# Patient Record
Sex: Female | Born: 1940 | Race: White | Hispanic: No | Marital: Married | Smoking: Former smoker
Health system: Southern US, Community
[De-identification: ages and names within clinical notes are randomized; demographics above are authoritative.]

## PROBLEM LIST (undated history)

## (undated) DIAGNOSIS — G8929 Other chronic pain: Secondary | ICD-10-CM

## (undated) DIAGNOSIS — J302 Other seasonal allergic rhinitis: Secondary | ICD-10-CM

## (undated) DIAGNOSIS — M8580 Other specified disorders of bone density and structure, unspecified site: Secondary | ICD-10-CM

## (undated) DIAGNOSIS — F419 Anxiety disorder, unspecified: Secondary | ICD-10-CM

## (undated) DIAGNOSIS — E7800 Pure hypercholesterolemia, unspecified: Secondary | ICD-10-CM

## (undated) DIAGNOSIS — Z9289 Personal history of other medical treatment: Secondary | ICD-10-CM

## (undated) DIAGNOSIS — M545 Low back pain, unspecified: Secondary | ICD-10-CM

## (undated) DIAGNOSIS — M1990 Unspecified osteoarthritis, unspecified site: Secondary | ICD-10-CM

## (undated) DIAGNOSIS — I4891 Unspecified atrial fibrillation: Secondary | ICD-10-CM

## (undated) DIAGNOSIS — R002 Palpitations: Secondary | ICD-10-CM

## (undated) DIAGNOSIS — K2970 Gastritis, unspecified, without bleeding: Secondary | ICD-10-CM

## (undated) HISTORY — DX: Unspecified atrial fibrillation: I48.91

## (undated) HISTORY — DX: Other specified disorders of bone density and structure, unspecified site: M85.80

## (undated) HISTORY — PX: COLONOSCOPY: SHX174

## (undated) HISTORY — PX: DILATION AND CURETTAGE OF UTERUS: SHX78

## (undated) HISTORY — DX: Personal history of other medical treatment: Z92.89

## (undated) HISTORY — PX: JOINT REPLACEMENT: SHX530

## (undated) HISTORY — PX: TONSILLECTOMY: SUR1361

---

## 1998-10-19 ENCOUNTER — Other Ambulatory Visit: Admission: RE | Admit: 1998-10-19 | Discharge: 1998-10-19 | Payer: Self-pay | Admitting: *Deleted

## 1999-12-04 ENCOUNTER — Encounter: Payer: Self-pay | Admitting: *Deleted

## 1999-12-04 ENCOUNTER — Encounter: Admission: RE | Admit: 1999-12-04 | Discharge: 1999-12-04 | Payer: Self-pay | Admitting: *Deleted

## 2001-04-29 ENCOUNTER — Encounter: Admission: RE | Admit: 2001-04-29 | Discharge: 2001-04-29 | Payer: Self-pay | Admitting: Family Medicine

## 2001-04-29 ENCOUNTER — Encounter: Payer: Self-pay | Admitting: Family Medicine

## 2001-06-07 ENCOUNTER — Encounter: Payer: Self-pay | Admitting: Family Medicine

## 2001-06-07 ENCOUNTER — Encounter: Admission: RE | Admit: 2001-06-07 | Discharge: 2001-06-07 | Payer: Self-pay | Admitting: Family Medicine

## 2002-05-28 ENCOUNTER — Encounter: Payer: Self-pay | Admitting: Emergency Medicine

## 2002-05-28 ENCOUNTER — Emergency Department (HOSPITAL_COMMUNITY): Admission: EM | Admit: 2002-05-28 | Discharge: 2002-05-28 | Payer: Self-pay | Admitting: Emergency Medicine

## 2002-07-25 ENCOUNTER — Encounter: Admission: RE | Admit: 2002-07-25 | Discharge: 2002-07-25 | Payer: Self-pay | Admitting: *Deleted

## 2002-07-25 ENCOUNTER — Encounter: Payer: Self-pay | Admitting: *Deleted

## 2003-08-01 ENCOUNTER — Encounter: Admission: RE | Admit: 2003-08-01 | Discharge: 2003-08-01 | Payer: Self-pay | Admitting: Pediatric Nephrology

## 2004-09-09 ENCOUNTER — Other Ambulatory Visit: Admission: RE | Admit: 2004-09-09 | Discharge: 2004-09-09 | Payer: Self-pay | Admitting: *Deleted

## 2004-10-09 ENCOUNTER — Encounter: Admission: RE | Admit: 2004-10-09 | Discharge: 2004-10-09 | Payer: Self-pay | Admitting: *Deleted

## 2005-11-24 ENCOUNTER — Encounter: Admission: RE | Admit: 2005-11-24 | Discharge: 2005-11-24 | Payer: Self-pay | Admitting: *Deleted

## 2005-12-01 ENCOUNTER — Other Ambulatory Visit: Admission: RE | Admit: 2005-12-01 | Discharge: 2005-12-01 | Payer: Self-pay | Admitting: *Deleted

## 2006-12-11 ENCOUNTER — Other Ambulatory Visit: Admission: RE | Admit: 2006-12-11 | Discharge: 2006-12-11 | Payer: Self-pay | Admitting: *Deleted

## 2006-12-14 ENCOUNTER — Encounter: Admission: RE | Admit: 2006-12-14 | Discharge: 2006-12-14 | Payer: Self-pay | Admitting: *Deleted

## 2007-12-15 ENCOUNTER — Encounter: Admission: RE | Admit: 2007-12-15 | Discharge: 2007-12-15 | Payer: Self-pay | Admitting: *Deleted

## 2008-03-20 ENCOUNTER — Other Ambulatory Visit: Admission: RE | Admit: 2008-03-20 | Discharge: 2008-03-20 | Payer: Self-pay | Admitting: Family Medicine

## 2008-08-04 ENCOUNTER — Encounter: Admission: RE | Admit: 2008-08-04 | Discharge: 2008-08-04 | Payer: Self-pay | Admitting: Chiropractic Medicine

## 2008-12-15 ENCOUNTER — Encounter: Admission: RE | Admit: 2008-12-15 | Discharge: 2008-12-15 | Payer: Self-pay | Admitting: Family Medicine

## 2009-08-04 ENCOUNTER — Encounter: Payer: Self-pay | Admitting: Sports Medicine

## 2009-10-02 ENCOUNTER — Ambulatory Visit: Payer: Self-pay | Admitting: Sports Medicine

## 2009-10-02 DIAGNOSIS — M545 Low back pain, unspecified: Secondary | ICD-10-CM | POA: Insufficient documentation

## 2009-10-02 DIAGNOSIS — M25559 Pain in unspecified hip: Secondary | ICD-10-CM | POA: Insufficient documentation

## 2009-10-02 DIAGNOSIS — M25569 Pain in unspecified knee: Secondary | ICD-10-CM | POA: Insufficient documentation

## 2009-11-14 ENCOUNTER — Ambulatory Visit: Payer: Self-pay | Admitting: Sports Medicine

## 2009-11-14 DIAGNOSIS — M1610 Unilateral primary osteoarthritis, unspecified hip: Secondary | ICD-10-CM | POA: Insufficient documentation

## 2009-11-14 DIAGNOSIS — M7750 Other enthesopathy of unspecified foot: Secondary | ICD-10-CM | POA: Insufficient documentation

## 2009-11-14 DIAGNOSIS — M169 Osteoarthritis of hip, unspecified: Secondary | ICD-10-CM | POA: Insufficient documentation

## 2009-11-14 DIAGNOSIS — M21619 Bunion of unspecified foot: Secondary | ICD-10-CM | POA: Insufficient documentation

## 2009-12-25 ENCOUNTER — Encounter: Admission: RE | Admit: 2009-12-25 | Discharge: 2009-12-25 | Payer: Self-pay | Admitting: Family Medicine

## 2010-01-23 ENCOUNTER — Encounter: Admission: RE | Admit: 2010-01-23 | Discharge: 2010-01-23 | Payer: Self-pay | Admitting: Family Medicine

## 2010-01-23 ENCOUNTER — Encounter: Payer: Self-pay | Admitting: Family Medicine

## 2010-01-23 ENCOUNTER — Ambulatory Visit: Payer: Self-pay | Admitting: Sports Medicine

## 2010-01-25 ENCOUNTER — Telehealth: Payer: Self-pay | Admitting: Family Medicine

## 2010-01-30 ENCOUNTER — Encounter: Admission: RE | Admit: 2010-01-30 | Discharge: 2010-03-26 | Payer: Self-pay | Admitting: Sports Medicine

## 2010-02-21 ENCOUNTER — Ambulatory Visit: Payer: Self-pay | Admitting: Sports Medicine

## 2010-03-26 ENCOUNTER — Encounter: Payer: Self-pay | Admitting: Sports Medicine

## 2010-08-18 ENCOUNTER — Encounter: Payer: Self-pay | Admitting: *Deleted

## 2010-08-27 NOTE — Assessment & Plan Note (Signed)
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 Pain stable but remains bothersome. No bowel/bladder incontinence. No saddle anesthesia, paresthesias, or difficulty walking. No L-Spine xrays, surgeries, or procedures.  Left hip pain improved. Pain experienced along lateral and grin aspects. Severe DJD by x-ray report from 2010.  Antero-lateral left knee pain unchanged, but remains bothersome. No prior left knee x-rays, surgeries, or procedures. No sweling, locking, popping, catching, or instability.    Allergies: No Known Drug Allergies  Past History:  Past Medical History: DJD  Past Surgical History: No prior back/hip/knee surgeries PMH-FH-SH reviewed for relevance  Physical Exam  General:  Well-developed,well-nourished,in no acute distress; alert,appropriate and cooperative throughout examination Msk:  BACK: No spinal ttp. No apparent bony deformity. FROM at the waist w/mild pain on L bend/rotation. No muscular ttp.  No apparent  spasm.  LEFT HIP: Unchanged from LOV [place] (+) log roll on left.  KNEES: FROM.  Full strength. No swelling, discoloration, or increased warmth. No ligamentous instability.  No jt line ttp. Neg McMurray's left.  No crepitus. Neurologic:  Normal symmetric DTR throughout the lower extremities. (-) SLR bilaterally. Non-antalgic gait. No foot drop.   Impression & Recommendations:  Problem # 1:  LOW BACK PAIN, CHRONIC (ICD-724.2) Assessment Unchanged Stable.  - LSPine xrays to assess for degenerative changes. - Start formal PT. - RTC in 4-6 weeks. Immediately seek MD attention for worsening symptoms, anesthesia, incontinence, or any other concerns.  Orders: Radiology other (Radiology  Other)  Problem # 2:  HIP PAIN, LEFT (ICD-719.45) Assessment: Improved Due to DJD and lumbar radiculopathy. Mangement options, along with their respective risks and benefits were discussed during this encounter. Will proceed as follows:  - Formal physical therapy. - RTC in 4-6 weeks.  Problem # 3:  KNEE PAIN, LEFT, CHRONIC (ICD-719.46) Multifactorial. Likely due to lumbar radiculopathy and left hip pain. Underlying left knee DJD also likely.  - Left knee x-rays to assess for degenerative changes. - Start formal PT. - Recumbent biking, and elliptical exercises as tolerated. - RTC in 4-6 weeks.  Orders: Radiology other (Radiology Other)  Patient Instructions: 1)  Straight Leg Side Swings - 60 times daily. 2)  Straight Leg Front Swings - 60 times daily. 3)  Straight Leg Back Swings - 60 times daily. 4)  Left Hip Rotations - 60 times daily. 5)  Return to our clinic in 4 weeks.

## 2010-08-27 NOTE — Letter (Signed)
Summary: Generic Letter  [facility]  [address]   [place], [place] [address]   Phone: [phone]  Fax: [phone]    02/21/2010  ANNJEANETTE SARWAR [address] [place], [place]  [address]  Dear Ms. Katich,   This letter verifies that I think it would be fine for you to take a class in water aerobics.          Sincerely,     Enid Baas MD

## 2010-08-27 NOTE — Miscellaneous (Signed)
Summary: [facility]  [facility]   Imported By: Marily Memos 03/26/2010 14:39:08  _____________________________________________________________________  External Attachment:    Type:   Image     Comment:   External Document

## 2010-08-27 NOTE — Progress Notes (Signed)
  Phone Note Call from Patient   Summary of Call: We discussed Shelia [name]'s Lspine/Knee x-ray findigns from 01/23/10. We will continue the current plan. Initial call taken by: Valarie Merino MD,  January 25, 2010 9:48 AM

## 2010-08-27 NOTE — Progress Notes (Signed)
  Phone Note Outgoing Call   Call placed by: Valarie Merino MD,  January 25, 2010 9:39 AM Call placed to: Patient Summary of Call: Left a voice-message, asking the patient to return my call.

## 2010-08-27 NOTE — Assessment & Plan Note (Signed)
Summary: f/u,mc   Vital Signs:  Patient profile:   70 year old female Height:      68 inches BP sitting:   114 / 72  (right arm) Cuff size:   regular  Vitals Entered By: Tessie Fass CMA (February 21, 2010 2:59 PM) CC: F/U   CC:  F/U.  History of Present Illness: Now doing exercises for leg strength series of low back exercises from PT are helping a lot PT helped her be more consistent water aerobics  2x wk gym 3x wk - upper body and 20 mins on recumbent bike  feels that she has gained a lot still w left hip pain w walking more than 5 to 10 mins  otherwise left hip not causing much pain now  Allergies: No Known Drug Allergies  Physical Exam  General:  Well-developed,well-nourished,in no acute distress; alert,appropriate and cooperative throughout examination Msk:  Left hip still shows limited ROM 15 deg IR and maybe 25 deg ER RT hip norm rotation at about 65 deg total  good hip abductionk, flexion and adduction on left now and has equalled RT sartorius norm 5/5 strength  walking gait without trendelenburg    Impression & Recommendations:  Problem # 1:  OSTEOARTHRITIS, HIP, 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) Much improved on exercise program  will still need to limit weight bearing exercise  keep up strength  reck 4 mos  Problem # 2:  LOW BACK PAIN, CHRONIC (ICD-724.2) keep up stretch and motion program  sxs much less now   see instructions  Patient Instructions: 1)  Monday and thursday 2)  do some upper body strength work 3)  Tuesday and Friday - work hip abduction, adduction and hip rotation 4)  Wed and Saturday focus on back exercise series 5)  This gives each area 2 days per week which will maintain or improve strength 6)  Then daily try to get minimum of 20 minutes but ideally 30 mins of : 7)  walking, recumbent bike, treadmill, elliptical, swimming pool or any acitivy that helps aerobically 8)  Reck in 3 to 4 mos

## 2010-08-27 NOTE — Assessment & Plan Note (Signed)
Summary: NP,HIP/LOWER BACK PAIN,MC   Vital Signs:  Patient profile:   70 year old female Height:      68 inches Weight:      139 pounds BMI:     21.21 BP sitting:   116 / 66  Vitals Entered By: Lillia Pauls CMA (October 02, 2009 10:54 AM)  CC:  left hip and low back pain.  History of Present Illness: Pt c/o left sided low back, hip, leg, and knee pain for the past 18 months.  She says she started a spinning class routine around that time and since then she has noticed a lot of hip and leg pain with exercise and if she stands or walks too much.  She has had low back pain for decades, so the back pain is not new, but the leg and hip involvement is new. She describes it as a dull ache. Certain motions make it worse, such as trying to cross the left leg over the right, when she does that she has a lot of tightness and pain in the medial hip joint.  Has tried some chiropractic treatments with a little relief but not much.    She has had hip xrays which show signioficant degenerative changes at the left hip joint and loss of joint space as well as some osteophytes.  Allergies (verified): No Known Drug Allergies  Review of Systems MS:  Complains of joint pain, loss of strength, low back pain, and stiffness. Neuro:  Denies numbness and tingling.  Physical Exam  General:  Well-developed,well-nourished,in no acute distress; alert,appropriate and cooperative throughout examination Msk:  Hip: left ROM IR: only about 15 Deg, ER: 30 Deg, Flexion: 90 Deg, Extension: 40 Deg, Abduction: 45 Deg, Adduction: 45 Deg Strength decreased IR: 4/5, ER: 4/5, Flexion: 4/5, Extension: 5/5, Abduction: 4/5, Adduction: 4/5 Pelvic alignment unremarkable to inspection and palpation. Greater trochanter without tenderness to palpation. No tenderness over piriformis and greater trochanter. No SI joint tenderness and normal minimal SI movement.  Hip R: IR 30, ER 30, flx 90, ext 30 abduction 4/5, flexion 5/5, ext  5/5  Knee: left Normal to inspection with no erythema or effusion or obvious bony abnormalities. Palpation normal with no warmth or joint line tenderness or patellar tenderness or condyle tenderness, mild patellar crepitus ROM normal in flexion and extension and lower leg rotation. Ligaments with solid consistent endpoints including ACL, PCL, LCL, MCL. Negative Mcmurray's and provocative meniscal tests. Non painful patellar compression. Patellar and quadriceps tendons unremarkable. Hamstring and quadriceps strength is normal.      Back Exam: Inspection: significant chronic changes along paraspinal musculature of lumbar spine b/l, very spastic Motion: SLR seated:       neg                   SLR lying: neg XSLR seated:     neg                   XSLR lying: neg Palpable tenderness: none Sensory change: none Reflex change: none    Neurologic:  sensation intact Psych:  Cognition and judgment appear intact. Alert and cooperative with normal attention span and concentration.    Impression & Recommendations:  Problem # 1:  HIP PAIN, LEFT (ICD-719.45) significant arthritis and loss of ROM, this has lead to significant weakness of the abductors and flexors Prescribed hip rehab exercises for the next 6 weeks can do walking or recumbent bike as tolerated for cardio, avoid spin class  continue antiinflammatories, try devil's claw  Problem # 2:  LOW BACK PAIN, CHRONIC (ICD-724.2) chronic in nature, significant sclerosis and arthritis at L4-L5 may be exacerbated by recent hip issues  Problem # 3:  KNEE PAIN, LEFT (ICD-719.46) knee is normal on exam, pain is likely secondary to hip dysfunction  Patient Instructions: 1)  Hip exercises 6-8 reps, 2 sets daily 2)  F/U 6 weeks

## 2010-08-27 NOTE — Assessment & Plan Note (Signed)
Summary: F/U,MC   Vital Signs:  Patient profile:   70 year old female BP sitting:   97 / 62  Vitals Entered By: Lillia Pauls CMA (November 14, 2009 10:48 AM)  History of Present Illness: Saw Dr Zachery Conch for chiropractic for awhile for back and got some relief after a period this did not help  50% improvment in hip and knee pain on left since starting exerc program still difficult to walk more than 10 mins as pain will go to Lt knee Has known mod severe DJD of left hip by xray has some element of DDD lumbar spine  sometimes feels some tightiness on RT hip  has done exercises for hip weakness noted last visit   Allergies: No Known Drug Allergies  Physical Exam  General:  Well-developed,well-nourished,in no acute distress; alert,appropriate and cooperative throughout examination Msk:  Bilat knee exams are not remarkable left hip flex and abduction weakness has completely resolved  biggest issue is lack of rotation of left hip only 15 eg IR and ER this is < 50% opf RT hip  feet show marked MT arch breakdown on left bunion on RT loss of long arch is mod  gait is pretty good inspite of limt w some shift to left but no trendelenburg   Impression & Recommendations:  Problem # 1:  KNEE PAIN, LEFT (ICD-719.46) this is referred from severe DJD of left hip  definitely improving  Problem # 2:  OSTEOARTHRITIS, HIP, 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

## 2010-08-27 NOTE — Letter (Signed)
Summary: [facility] PT referral form  [facility] PT referral form   Imported By: Marily Memos 01/23/2010 16:00:15  _____________________________________________________________________  External Attachment:    Type:   Image     Comment:   External Document

## 2010-09-24 ENCOUNTER — Other Ambulatory Visit: Payer: Self-pay | Admitting: Family Medicine

## 2010-09-24 ENCOUNTER — Other Ambulatory Visit (HOSPITAL_COMMUNITY)
Admission: RE | Admit: 2010-09-24 | Discharge: 2010-09-24 | Disposition: A | Discharge status: Home / Self Care | Payer: Medicare Other | Source: Ambulatory Visit | Attending: Family Medicine | Admitting: Family Medicine

## 2010-09-24 DIAGNOSIS — Z124 Encounter for screening for malignant neoplasm of cervix: Secondary | ICD-10-CM | POA: Insufficient documentation

## 2010-12-10 ENCOUNTER — Other Ambulatory Visit: Payer: Self-pay | Admitting: Family Medicine

## 2010-12-10 DIAGNOSIS — Z1231 Encounter for screening mammogram for malignant neoplasm of breast: Secondary | ICD-10-CM

## 2010-12-27 ENCOUNTER — Ambulatory Visit
Admission: RE | Admit: 2010-12-27 | Discharge: 2010-12-27 | Disposition: A | Discharge status: Home / Self Care | Payer: Medicare Other | Source: Ambulatory Visit | Attending: Family Medicine | Admitting: Family Medicine

## 2010-12-27 DIAGNOSIS — Z1231 Encounter for screening mammogram for malignant neoplasm of breast: Secondary | ICD-10-CM

## 2011-04-14 ENCOUNTER — Ambulatory Visit (INDEPENDENT_AMBULATORY_CARE_PROVIDER_SITE_OTHER): Payer: Medicare Other | Admitting: Sports Medicine

## 2011-04-14 VITALS — BP 96/64

## 2011-04-14 DIAGNOSIS — M545 Low back pain, unspecified: Secondary | ICD-10-CM

## 2011-04-14 DIAGNOSIS — M999 Biomechanical lesion, unspecified: Secondary | ICD-10-CM

## 2011-04-14 DIAGNOSIS — M1610 Unilateral primary osteoarthritis, unspecified hip: Secondary | ICD-10-CM

## 2011-04-14 DIAGNOSIS — M169 Osteoarthritis of hip, unspecified: Secondary | ICD-10-CM

## 2011-04-14 DIAGNOSIS — M25559 Pain in unspecified hip: Secondary | ICD-10-CM

## 2011-04-14 MED ORDER — TRAMADOL HCL 50 MG PO TABS: 50.0000 mg | ORAL_TABLET | Freq: Two times a day (BID) | ORAL | Status: AC | PRN

## 2011-04-14 NOTE — Assessment & Plan Note (Addendum)
Pt has evidence of osteoarthritis of the left hip on x-ray back in August of 2010. Patient actually has increased range of motion and pain has been neutral during this interval.  Patient denies much radiation of pain and no pallor bladder problems. Patient is still able to do all her activities of daily living and has a quite active lifestyle. Patient told to increase activity such as hip abductor strength and given exercises for these. Patient also will continue her yoga swimming elliptical training. Patient can followup as needed or for worsening pain. Patient also given tramadol to use at night if necessary.

## 2011-04-14 NOTE — Progress Notes (Signed)
70 yo female coming in with left hip pain.  Pt was seen for the same problem about 1 year ago.  Pt has started to do activity as usual doing yoga 3 times a week, doing physical therapy as well 2 times a week and trying water aerobics.  Pt states she is doing well but still not having the ROM she would like on the left hip.  Pt states she is also getting back pain as well since she has been compensating for her hip pain.  She has been doing some exercises for her back as well and deep tissue message but has not made much of a difference.  Pt states when she does have pain she states it is a little in the groin and no pain that passes her knee, none of her joints ever seem to be giving out on her and she states it does not stop her from activities of daily living.  Pt though would like to be hiking and walking more but does not.  Pt did state that the pain did wake her at night but started to take tylenol at night. 1000 mgm does help her sleep.  General: Well-developed,well-nourished,in no acute distress; alert,appropriate and cooperative throughout examination  Msk: Left hip shows limited ROM  20 (was 15 at last visit) deg IR and maybe 30 (was 25 deg last time) ER  RT hip norm rotation at about 65 deg total  Weak hip abduction on the left side compared to right.  sartorius norm  5/5 strength  walking gait without trendelenburg Neg SLT  OMT findings:  L2 rotated and sidebent right

## 2011-04-14 NOTE — Assessment & Plan Note (Addendum)
After verbal consent pt did have HVLA, with marked improvement.  Gave side effects to look out for and can take anti inflammatories in the acute time frame. Patient given exercises to try to decrease the muscle tightness of the hip flexors that can be contributing.

## 2011-04-14 NOTE — Assessment & Plan Note (Signed)
Hopefully this will stabilize with exercise program and moderation  Follow yearly

## 2011-04-14 NOTE — Assessment & Plan Note (Signed)
Likely secondary to compensation for her hip pain. Patient though also would probably have some osteoarthritis there as well. At this time patient did respond well to manipulation given some stretches to do as well. Patient may follow up for manipulation if it is beneficial but do think that if patient improves strength of her hip likely the back pain will improve as well.

## 2011-04-14 NOTE — Patient Instructions (Addendum)
Very nice to meet you Do the hip exercises Come back in 4 weeks for re-evaluation If manipulation works then come see me Terrilee Files at [facility] [phone]

## 2011-09-25 ENCOUNTER — Other Ambulatory Visit: Payer: Self-pay | Admitting: Family Medicine

## 2011-09-25 DIAGNOSIS — F17200 Nicotine dependence, unspecified, uncomplicated: Secondary | ICD-10-CM

## 2011-09-30 ENCOUNTER — Other Ambulatory Visit: Payer: Medicare Other

## 2011-11-24 ENCOUNTER — Other Ambulatory Visit: Payer: Self-pay | Admitting: Family Medicine

## 2011-11-24 DIAGNOSIS — Z1231 Encounter for screening mammogram for malignant neoplasm of breast: Secondary | ICD-10-CM

## 2011-12-30 ENCOUNTER — Ambulatory Visit: Payer: Medicare Other

## 2012-01-20 ENCOUNTER — Ambulatory Visit
Admission: RE | Admit: 2012-01-20 | Discharge: 2012-01-20 | Disposition: A | Discharge status: Home / Self Care | Payer: Medicare Other | Source: Ambulatory Visit | Attending: Family Medicine | Admitting: Family Medicine

## 2012-01-20 DIAGNOSIS — Z1231 Encounter for screening mammogram for malignant neoplasm of breast: Secondary | ICD-10-CM

## 2012-12-07 ENCOUNTER — Ambulatory Visit (INDEPENDENT_AMBULATORY_CARE_PROVIDER_SITE_OTHER): Payer: Medicare Other | Admitting: Family Medicine

## 2012-12-07 VITALS — BP 112/60 | HR 77 | Temp 98.3°F | Resp 16 | Ht 68.0 in | Wt 135.0 lb

## 2012-12-07 DIAGNOSIS — J029 Acute pharyngitis, unspecified: Secondary | ICD-10-CM

## 2012-12-07 NOTE — Progress Notes (Addendum)
[facility] [address], [place] [address] [phone]  Date:  12/07/2012   Name:  Shelia [name]   DOB:  19-Apr-1941   MRN:  [id number]  PCP:  Astrid Divine, MD    Chief Complaint: Cough, Sore Throat and Fever   History of Present Illness:  Shelia [name] is a 72 y.o. very pleasant female patient who presents with the following:  She has noted a sore throat and positive strep exposure- her granddaughter, daughter, and 2 other family members have had strep recently.  She noted a slight scratchy throat, but this morning it was worse.  Otherwise she feels ok- does not have any malaise, aches or fever.  She had noted a slight cough/ tickle in her chest lately but is not that concerned about this.  Feels this is likely related to seasonal allergies      Patient Active Problem List   Diagnosis Date Noted  . Nonallopathic lesion of lumbar region 04/14/2011  . OSTEOARTHRITIS, HIP, LEFT 11/14/2009  . METATARSALGIA 11/14/2009  . BUNION, RIGHT FOOT 11/14/2009  . HIP PAIN, LEFT 10/02/2009  . Pain in joint, lower leg 10/02/2009  . LOW BACK PAIN, CHRONIC 10/02/2009    History reviewed. No pertinent past medical history.  History reviewed. No pertinent past surgical history.  History  Substance Use Topics  . Smoking status: Former Games developer  . Smokeless tobacco: Not on file  . Alcohol Use: No    Family History  Problem Relation Age of Onset  . Hypertension Mother   . Cancer Father   . Diabetes Sister     No Known Allergies  Medication list has been reviewed and updated.  Current Outpatient Prescriptions on File Prior to Visit  Medication Sig Dispense Refill  . fish oil-omega-3 fatty acids 1000 MG capsule Take 2 g by mouth daily.        Marland Kitchen loratadine (CLARITIN) 10 MG tablet Take 10 mg by mouth daily.        Marland Kitchen acetaminophen (TYLENOL) 325 MG tablet Take 650 mg by mouth every 6 (six) hours as needed.         No current  facility-administered medications on file prior to visit.    Review of Systems:  As per HPI- otherwise negative.   Physical Examination: Filed Vitals:   12/07/12 0836  BP: 112/60  Pulse: 77  Temp: 99.3 F (37.4 C)  Resp: 16   Filed Vitals:   12/07/12 0836  Height: 5\' 8"  (1.727 m)  Weight: 135 lb (61.236 kg)   Body mass index is 20.53 kg/(m^2). Ideal Body Weight: Weight in (lb) to have BMI = 25: 164.1  GEN: WDWN, NAD, Non-toxic, A & O x 3, looks well   HEENT: Atraumatic, Normocephalic. Neck supple. No masses, No LAD.  Bilateral TM wnl, oropharynx normal.  PEERL,EOMI.   Ears and Nose: No external deformity. CV: RRR, No M/G/R. No JVD. No thrill. No extra heart sounds. PULM: CTA B, no wheezes, crackles, rhonchi. No retractions. No resp. distress. No accessory muscle use. ABD: S, NT, ND EXTR: No c/c/e NEURO Normal gait.  PSYCH: Normally interactive. Conversant. Not depressed or anxious appearing.  Calm demeanor.    Results for orders placed in visit on 12/07/12  POCT RAPID STREP A (OFFICE)      Result Value Range   Rapid Strep A Screen Negative  Negative    Assessment and Plan: Acute pharyngitis - Plan: POCT rapid strep A, Culture, Group A  Strep  Await throat culture. Doubt strep based on exam and negative rapid strep, so she is comfortable deferring treatment until throat culture comes back.    Signed Abbe Amsterdam, MD  Called to let her know her throat culture is negative.  LMOM- call if any concerns

## 2012-12-07 NOTE — Patient Instructions (Addendum)
You most likely have a viral sore throat or allergies.  I will be in touch with the result of your throat culture.  If you are getting worse or have any other symptoms in the meantime please let me know.

## 2012-12-09 LAB — CULTURE, GROUP A STREP: Organism ID, Bacteria: NORMAL

## 2012-12-14 ENCOUNTER — Other Ambulatory Visit: Payer: Self-pay

## 2012-12-14 DIAGNOSIS — Z1231 Encounter for screening mammogram for malignant neoplasm of breast: Secondary | ICD-10-CM

## 2013-02-02 ENCOUNTER — Ambulatory Visit (INDEPENDENT_AMBULATORY_CARE_PROVIDER_SITE_OTHER): Payer: Medicare Other | Admitting: Sports Medicine

## 2013-02-02 ENCOUNTER — Encounter: Payer: Self-pay | Admitting: Sports Medicine

## 2013-02-02 VITALS — BP 117/76 | HR 73 | Ht 68.0 in | Wt 135.0 lb

## 2013-02-02 DIAGNOSIS — M1610 Unilateral primary osteoarthritis, unspecified hip: Secondary | ICD-10-CM

## 2013-02-02 DIAGNOSIS — R269 Unspecified abnormalities of gait and mobility: Secondary | ICD-10-CM

## 2013-02-02 DIAGNOSIS — M2170 Unequal limb length (acquired), unspecified site: Secondary | ICD-10-CM

## 2013-02-02 DIAGNOSIS — M169 Osteoarthritis of hip, unspecified: Secondary | ICD-10-CM

## 2013-02-02 NOTE — Assessment & Plan Note (Signed)
She has been in rigid orthotics for a long time  No correction for Leg length  Added to see if she can walk with less pain  Gait is improved with the lift - felt taper  Note she may need a more cushioned orthotic is pain persists

## 2013-02-02 NOTE — Assessment & Plan Note (Signed)
She is doing well with HEP and with Curcumin preparation  Keep up plan  Getting some RT SIJ pain Will add to HEP to open hip with rotation and correct LLI  Reck 3 mos

## 2013-02-02 NOTE — Patient Instructions (Addendum)
Try orthotic correction for the next month- if this helps we could make you a pair of cushioned custom orthotics  Ok to continue taking phenocane  Do repeats 5 reps of balance exercises- hold for 5-10 seconds each  Diver - eyes open  Standing hip rotation - eyes open  Standing 1 leg balance- with eyes closed ( stand near something you can use for balance)  Please follow up in 1 month  Thank you for seeing [place] today!

## 2013-02-02 NOTE — Assessment & Plan Note (Signed)
Lift added to RT rigid orthotic that she has used

## 2013-02-02 NOTE — Progress Notes (Signed)
  Subjective:    Patient ID: Shelia [name], female    DOB: [birth year], 72 y.o.   MRN: [id number]  HPI  Pt presents to clinic for f/u of Lt hip and lower back pain. She has known osteoarthritis of Lt hip, and is managing it with exercise and phenocane (curcumin and DLPA) that she purchased at [employer], she feels this is very helpful. Has problems standing for long periods.  Doing 50 min water aerobic routine x 4-5 days per week.  Walks on the weekends, and when she can during the week. Gets left knee pain and rt SI joint pain at times. Pain level usually 3-4 on pain scale of 10 when she takes phenocane, but get can get up to 7-8.  Has custom orthotics that are rigid.    Review of Systems     Objective:   Physical Exam  Hip exam 10 deg IR on lt 20 deg ER on lt 30 deg IR on rt 30 deg ER on rt  Lying position- left leg is longer than rt, does not correct with sitting 25 deg ER on lt 15 deg IR on rt 35 deg ER on rt 30 deg IR on rt 140 deg flexion on rt 130 deg flexion on lt   Leg lengths 96 cm on lt 95 cm on rt  Mild lumbar scoliosis Lt hip and shoulder higher with standing Rt SI joint tight  Walking gait Mild trendelenburg to left, rt hip is low         Assessment & Plan:

## 2013-02-08 ENCOUNTER — Ambulatory Visit
Admission: RE | Admit: 2013-02-08 | Discharge: 2013-02-08 | Disposition: A | Discharge status: Home / Self Care | Payer: Medicare Other | Source: Ambulatory Visit

## 2013-02-08 DIAGNOSIS — Z1231 Encounter for screening mammogram for malignant neoplasm of breast: Secondary | ICD-10-CM

## 2013-03-08 ENCOUNTER — Ambulatory Visit (INDEPENDENT_AMBULATORY_CARE_PROVIDER_SITE_OTHER): Payer: Medicare Other | Admitting: Sports Medicine

## 2013-03-08 VITALS — BP 90/60 | Ht 68.0 in | Wt 135.0 lb

## 2013-03-08 DIAGNOSIS — M545 Low back pain, unspecified: Secondary | ICD-10-CM

## 2013-03-08 DIAGNOSIS — M169 Osteoarthritis of hip, unspecified: Secondary | ICD-10-CM

## 2013-03-08 DIAGNOSIS — R269 Unspecified abnormalities of gait and mobility: Secondary | ICD-10-CM

## 2013-03-08 DIAGNOSIS — M1610 Unilateral primary osteoarthritis, unspecified hip: Secondary | ICD-10-CM

## 2013-03-08 DIAGNOSIS — M2170 Unequal limb length (acquired), unspecified site: Secondary | ICD-10-CM

## 2013-03-08 NOTE — Assessment & Plan Note (Addendum)
Pt switched from rigid orthotic today to Hapad with shorter, flat heel wedge to see if this improves her comfort and Sx.  If no improvement, may need custom orthotics at next visit.

## 2013-03-08 NOTE — Assessment & Plan Note (Signed)
Trendelenburg is less when we have the leg length correction in place

## 2013-03-08 NOTE — Assessment & Plan Note (Signed)
Pt continues to have L hip pain with about 40 degrees total of rotation.  Most likely secondary to compensation for her LLI.  Continue with her aleve and Celebrex along with her Curcumin preparation as tolerated.  Added neutral heel wedge to both R shoe/sandal for improvement. Will see back in 2-3 months.

## 2013-03-08 NOTE — Progress Notes (Signed)
TAWAN CORKERN is a 72 y.o. female who presents today for f/u of her L hip pain and LBP.  She was last seen about one month ago for this, and has continued with her HEP and curcumin (curcumin) preparation which have helped her.  Her pain, especially in her lower back, and her left hip are worse with standing for prolonged periods of time but do improve somewhat with walking or water aerobics.  She has not gotten much relief from the HEP, and states this hasn't really alleviated some of her pain.  Denies any numbness, weakness, or burning pain into her lower legs.   We placed a leg length correction of the right but it was somewhat uncomfortable   ROS: Per HPI.  All other systems reviewed and are negative.   Physical Exam Filed Vitals:   03/08/13 1156  BP: 90/60    Physical Examination: MSK:  Hip exam  L IR 10-15 degrees, ER 20-25 degrees  R IR 30-35 degrees, L ER 30-35 degrees  130-140 deg flexion on rt  120-130 deg flexion on lt  Leg lengths  96 cm on lt  95 cm on rt  R innominate bone anterior rotated compared to L   Back: Mild lumbar scoliosis but no TTP at the SI joint.   Ankle: Neurovascular intact   L hip X-ray - Significant joint narrowing of the left hip with small osteophyte at the inferior acetabulum

## 2013-03-08 NOTE — Assessment & Plan Note (Signed)
Likely secondary to compensation for her hip pain. Patient though also would probably have some osteoarthritis there as well. Pt instructed and went over strengthening exercises and stretches for her lower back.  Detailed exercises in pt instructions

## 2013-03-08 NOTE — Patient Instructions (Signed)
Shelia [name], it was nice seeing you today.  Please complete the following exercises to help with both your hip and your back.  For your hamstring, please continue performing the diver as well as the internal/external rotation of the hips.  For your back please perform the following.  Please sit in a chair and lock your arms around your knees, stretching the muscles in your lower back.  As well, then try grabbing the outside of each foot to stretch the outside of the back muscles.    We will see you back in one to two months.  Thank you.

## 2013-03-10 ENCOUNTER — Ambulatory Visit: Payer: Medicare Other | Admitting: Sports Medicine

## 2013-05-03 ENCOUNTER — Ambulatory Visit: Payer: Medicare Other | Admitting: Sports Medicine

## 2013-05-03 ENCOUNTER — Ambulatory Visit (INDEPENDENT_AMBULATORY_CARE_PROVIDER_SITE_OTHER): Payer: Medicare Other | Admitting: Sports Medicine

## 2013-05-03 VITALS — BP 95/61 | Ht 67.5 in | Wt 135.0 lb

## 2013-05-03 DIAGNOSIS — M2170 Unequal limb length (acquired), unspecified site: Secondary | ICD-10-CM

## 2013-05-03 DIAGNOSIS — M1610 Unilateral primary osteoarthritis, unspecified hip: Secondary | ICD-10-CM

## 2013-05-03 DIAGNOSIS — M169 Osteoarthritis of hip, unspecified: Secondary | ICD-10-CM

## 2013-05-03 NOTE — Progress Notes (Signed)
  Subjective:    Patient ID: Shelia [name], female    DOB: 05/27/1941, 72 y.o.   MRN: [id number]  HPI  Pt presents to clinic for f/u of L hip pain and low back pain which is improving. Using Phenacaine - curcumin preparation has been very helpful. She is wearing a heel lift in rt shoe to correct leg length difference.  Continues with HEP, and working on balance exercises.   She is on an active HEP and does water aerobics Walks QOD  Hip pain is stable and not as severe as 1 yr ago   Review of Systems     Objective:   Physical Exam  NAD  1 leg balance with eyes closed- not stable right or left  Hips Rt - 60 deg total  Lt - 25 deg ER and 15 deg IR  Hip flexion good bilat Good hip abduction strength  Walking gait is improved with less trendelenburg  SI joint motion good Straight leg raise negative bilat       Assessment & Plan:

## 2013-05-03 NOTE — Patient Instructions (Addendum)
Continue using leg length correction in all shoes  Continue working on strengthening exercises  Ok to go walking 4-5 days per week   Stationary biking is good to help strengthen hip  Use light ankle weight for hip range of motion exercises  Avoid too much hip flexion or lifting too much weight with hip joint  Please follow up in 3 months  Thank you for seeing [place] today!

## 2013-05-03 NOTE — Assessment & Plan Note (Signed)
Left hip ROM has improved from about 30 deg total to 45 deg with HEP and use of lift  Keep up HEP  Keep using lift  Keep up herbal supplement  Reck 3 mos

## 2013-05-03 NOTE — Assessment & Plan Note (Signed)
Cont to use heel lift on all shoes on RT  Additional ones given  Sports insoles for walking as she has large bunion on RT

## 2014-01-03 ENCOUNTER — Other Ambulatory Visit: Payer: Self-pay

## 2014-01-03 DIAGNOSIS — Z1231 Encounter for screening mammogram for malignant neoplasm of breast: Secondary | ICD-10-CM

## 2014-02-10 ENCOUNTER — Ambulatory Visit
Admission: RE | Admit: 2014-02-10 | Discharge: 2014-02-10 | Disposition: A | Discharge status: Home / Self Care | Payer: Medicare Other | Source: Ambulatory Visit

## 2014-02-10 DIAGNOSIS — Z1231 Encounter for screening mammogram for malignant neoplasm of breast: Secondary | ICD-10-CM

## 2014-10-11 ENCOUNTER — Other Ambulatory Visit: Payer: Self-pay | Admitting: Orthopaedic Surgery

## 2014-10-11 DIAGNOSIS — M545 Low back pain: Secondary | ICD-10-CM

## 2014-10-30 ENCOUNTER — Other Ambulatory Visit: Payer: Self-pay

## 2014-11-08 ENCOUNTER — Ambulatory Visit
Admission: RE | Admit: 2014-11-08 | Discharge: 2014-11-08 | Disposition: A | Discharge status: Home / Self Care | Payer: Medicare Other | Source: Ambulatory Visit | Attending: Orthopaedic Surgery | Admitting: Orthopaedic Surgery

## 2014-11-08 DIAGNOSIS — M545 Low back pain: Secondary | ICD-10-CM

## 2015-08-24 DIAGNOSIS — M25552 Pain in left hip: Secondary | ICD-10-CM | POA: Diagnosis not present

## 2015-08-24 DIAGNOSIS — M25562 Pain in left knee: Secondary | ICD-10-CM | POA: Diagnosis not present

## 2015-08-29 ENCOUNTER — Ambulatory Visit (INDEPENDENT_AMBULATORY_CARE_PROVIDER_SITE_OTHER): Payer: PPO | Admitting: Podiatry

## 2015-08-29 ENCOUNTER — Encounter: Payer: Self-pay | Admitting: Podiatry

## 2015-08-29 VITALS — BP 123/80 | HR 48 | Resp 12

## 2015-08-29 DIAGNOSIS — Q828 Other specified congenital malformations of skin: Secondary | ICD-10-CM

## 2015-08-29 DIAGNOSIS — M2011 Hallux valgus (acquired), right foot: Secondary | ICD-10-CM | POA: Diagnosis not present

## 2015-08-29 NOTE — Progress Notes (Signed)
   Subjective:    Patient ID: Shelia [name], female    DOB: [birth year], 75 y.o.   MRN: [id number]  HPI this patient presents to my office with chief complaint of painful calluses on the balls of both feet. She states that her feet are painful walking and wearing her shoes. She states that this been going on for years and has been treated by multiple doctors with multiple sets of orthotics.  She has been treated with modifications on the orthoses including right heel lift.  She says she is going on vacation and desires an evaluation and treatment of her feet.  The patient presents here today for b/l feet pain. Review of Systems  All other systems reviewed and are negative.      Objective:   Physical Exam GENERAL APPEARANCE: Alert, conversant. Appropriately groomed. No acute distress.  VASCULAR: Pedal pulses palpable at  [facility] and PT bilateral.  Capillary refill time is immediate to all digits,  Normal temperature gradient.  Digital hair growth is present bilateral  NEUROLOGIC: sensation is normal to 5.07 monofilament at 5/5 sites bilateral.  Light touch is intact bilateral, Muscle strength normal.  MUSCULOSKELETAL: acceptable muscle strength, tone and stability bilateral.  Intrinsic muscluature intact bilateral.  Rectus appearance of foot and digits noted bilateral. Contracted fifth toe left foot.  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  DERMATOLOGIC: skin color, texture, and turgor are within normal limits.  No preulcerative lesions or ulcers  are seen, no interdigital maceration noted.  No open lesions present.  Digital nails are asymptomatic. No drainage noted. Porokeratosis noted sub 2,3 right foot.          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  Discussed her pathology with patient.  If she desires further with her deformity she will need an appointment at [facility].   Gardiner Barefoot DPM

## 2015-10-19 ENCOUNTER — Other Ambulatory Visit: Payer: Self-pay

## 2015-10-19 DIAGNOSIS — M859 Disorder of bone density and structure, unspecified: Secondary | ICD-10-CM | POA: Diagnosis not present

## 2015-10-19 DIAGNOSIS — F39 Unspecified mood [affective] disorder: Secondary | ICD-10-CM | POA: Diagnosis not present

## 2015-10-19 DIAGNOSIS — N760 Acute vaginitis: Secondary | ICD-10-CM | POA: Diagnosis not present

## 2015-10-19 DIAGNOSIS — Z0000 Encounter for general adult medical examination without abnormal findings: Secondary | ICD-10-CM | POA: Diagnosis not present

## 2015-10-19 DIAGNOSIS — Z01419 Encounter for gynecological examination (general) (routine) without abnormal findings: Secondary | ICD-10-CM | POA: Diagnosis not present

## 2015-10-19 DIAGNOSIS — Z1231 Encounter for screening mammogram for malignant neoplasm of breast: Secondary | ICD-10-CM

## 2015-10-19 DIAGNOSIS — J309 Allergic rhinitis, unspecified: Secondary | ICD-10-CM | POA: Diagnosis not present

## 2015-10-19 DIAGNOSIS — M1990 Unspecified osteoarthritis, unspecified site: Secondary | ICD-10-CM | POA: Diagnosis not present

## 2015-10-19 DIAGNOSIS — E7800 Pure hypercholesterolemia, unspecified: Secondary | ICD-10-CM | POA: Diagnosis not present

## 2015-10-19 DIAGNOSIS — Z131 Encounter for screening for diabetes mellitus: Secondary | ICD-10-CM | POA: Diagnosis not present

## 2015-10-19 DIAGNOSIS — L719 Rosacea, unspecified: Secondary | ICD-10-CM | POA: Diagnosis not present

## 2015-10-22 DIAGNOSIS — Z1211 Encounter for screening for malignant neoplasm of colon: Secondary | ICD-10-CM | POA: Diagnosis not present

## 2015-11-02 ENCOUNTER — Ambulatory Visit
Admission: RE | Admit: 2015-11-02 | Discharge: 2015-11-02 | Disposition: A | Discharge status: Home / Self Care | Payer: PPO | Source: Ambulatory Visit

## 2015-11-02 DIAGNOSIS — Z1231 Encounter for screening mammogram for malignant neoplasm of breast: Secondary | ICD-10-CM | POA: Diagnosis not present

## 2015-11-22 DIAGNOSIS — I491 Atrial premature depolarization: Secondary | ICD-10-CM | POA: Diagnosis not present

## 2015-11-22 DIAGNOSIS — R002 Palpitations: Secondary | ICD-10-CM | POA: Diagnosis not present

## 2015-12-05 ENCOUNTER — Ambulatory Visit (INDEPENDENT_AMBULATORY_CARE_PROVIDER_SITE_OTHER): Payer: PPO | Admitting: Podiatry

## 2015-12-05 ENCOUNTER — Encounter: Payer: Self-pay | Admitting: Podiatry

## 2015-12-05 DIAGNOSIS — M2011 Hallux valgus (acquired), right foot: Secondary | ICD-10-CM

## 2015-12-05 DIAGNOSIS — Q828 Other specified congenital malformations of skin: Secondary | ICD-10-CM | POA: Diagnosis not present

## 2015-12-05 NOTE — Progress Notes (Signed)
Subjective:     Patient ID: Shelia [name], female   DOB: Apr 28, 1941, 75 y.o.   MRN: [id number]  HPI this patient presents to my office with chief complaint of a painful callus in the middle of her right foot. She states this callus is painful walking and wearing her shoes. She is scheduled for a vacation which requires significant amount of walking. She presents the office today for an evaluation and treatment of this condition   Review of Systems     Objective:   Physical Exam GENERAL APPEARANCE: Alert, conversant. Appropriately groomed. No acute distress.  VASCULAR: Pedal pulses are  palpable at  [facility] and PT bilateral.  Capillary refill time is immediate to all digits,  Normal temperature gradient.  Digital hair growth is present bilateral  NEUROLOGIC: sensation is normal to 5.07 monofilament at 5/5 sites bilateral.  Light touch is intact bilateral, Muscle strength normal.  MUSCULOSKELETAL: acceptable muscle strength, tone and stability bilateral.  Intrinsic muscluature intact bilateral.  Contracted fifth toe left foot.  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  Hammer toes 2,3 right foot.   DERMATOLOGIC: skin color, texture, and turgor are within normal limits.  No preulcerative lesions or ulcers  are seen, no interdigital maceration noted.  No open lesions present.  Digital nails are asymptomatic. No drainage noted. Porokeratosis right forefoot.      Assessment:     Porokeratosis right foot     Plan:     Debridement of skin lesion.  RTC prn.   Gardiner Barefoot DPM

## 2015-12-25 DIAGNOSIS — H2513 Age-related nuclear cataract, bilateral: Secondary | ICD-10-CM | POA: Diagnosis not present

## 2015-12-25 DIAGNOSIS — H43811 Vitreous degeneration, right eye: Secondary | ICD-10-CM | POA: Diagnosis not present

## 2015-12-25 DIAGNOSIS — H16223 Keratoconjunctivitis sicca, not specified as Sjogren's, bilateral: Secondary | ICD-10-CM | POA: Diagnosis not present

## 2016-03-19 DIAGNOSIS — H16223 Keratoconjunctivitis sicca, not specified as Sjogren's, bilateral: Secondary | ICD-10-CM | POA: Diagnosis not present

## 2016-03-19 DIAGNOSIS — H04123 Dry eye syndrome of bilateral lacrimal glands: Secondary | ICD-10-CM | POA: Diagnosis not present

## 2016-05-28 DIAGNOSIS — M25552 Pain in left hip: Secondary | ICD-10-CM | POA: Diagnosis not present

## 2016-05-28 DIAGNOSIS — S0093XA Contusion of unspecified part of head, initial encounter: Secondary | ICD-10-CM | POA: Diagnosis not present

## 2016-05-28 DIAGNOSIS — N898 Other specified noninflammatory disorders of vagina: Secondary | ICD-10-CM | POA: Diagnosis not present

## 2016-05-28 DIAGNOSIS — M25562 Pain in left knee: Secondary | ICD-10-CM | POA: Diagnosis not present

## 2016-06-04 DIAGNOSIS — M25552 Pain in left hip: Secondary | ICD-10-CM | POA: Diagnosis not present

## 2016-06-04 DIAGNOSIS — M25562 Pain in left knee: Secondary | ICD-10-CM | POA: Diagnosis not present

## 2016-06-06 DIAGNOSIS — N760 Acute vaginitis: Secondary | ICD-10-CM | POA: Diagnosis not present

## 2016-06-06 DIAGNOSIS — Z124 Encounter for screening for malignant neoplasm of cervix: Secondary | ICD-10-CM | POA: Diagnosis not present

## 2016-06-10 DIAGNOSIS — M25552 Pain in left hip: Secondary | ICD-10-CM | POA: Diagnosis not present

## 2016-06-10 DIAGNOSIS — M25562 Pain in left knee: Secondary | ICD-10-CM | POA: Diagnosis not present

## 2016-06-16 ENCOUNTER — Telehealth: Payer: Self-pay | Admitting: Family Medicine

## 2016-06-16 DIAGNOSIS — S39012A Strain of muscle, fascia and tendon of lower back, initial encounter: Secondary | ICD-10-CM | POA: Diagnosis not present

## 2016-06-16 NOTE — Telephone Encounter (Signed)
How about 1030 on wed.

## 2016-06-16 NOTE — Telephone Encounter (Signed)
Pt called in and wanted to know if she could be worked in today for a shot in her hip.  She said that she is still in pain

## 2016-06-16 NOTE — Telephone Encounter (Signed)
Spoke to pt, scheduled her for 1030am on 11.22.17.

## 2016-06-17 NOTE — Progress Notes (Signed)
[name] [address], [place] [address] Phone: [phone] Subjective:    I'm seeing this patient by the request  of:  Osborne Casco, MD   CC: Hip pain  [id number]  Shelia [name] is a 75 y.o. female coming in with complaint of hip pain Patient has a past medical history significant for left hip pain. Patient was found to have a leg length discrepancy previously and as long as patient wore her heel lift she seems to be doing relatively well for quite some time.  Patient states that she is having worsening pain of the right hip. Patient states that this started to be aggravated after she took a fall. Started having pain on the right side. Mild radiation to the right buttocks. Worse with activity as well as with sitting. Some mild radiation to the thigh. No numbness. No weakness. Patient states though that it is starting to affect daily activities such as walking long distances. Patient states that it is difficult to find a comfortable position at night and is waking up earlier secondary to the pain.   past imaging includes lumbar x-rays back in 2011. 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  Patient's left hip x-rays in 2010 showed the patient had severe arthritis at that time.  No past medical history on file. No past surgical history on file. Social History   Social History  . Marital status: Married    Spouse name: N/A  . Number of children: N/A  . Years of education: N/A   Social History Main Topics  . Smoking status: Former Research scientist (life sciences)  . Smokeless tobacco: None  . Alcohol use No  . Drug use: No  . Sexual activity: Not Asked   Other Topics Concern  . None   Social History Narrative  . None   No Known Allergies Family History  Problem Relation Age of Onset  . Hypertension Mother   . Cancer Father   . Diabetes Sister     Past medical history,  social, surgical and family history all reviewed in electronic medical record.  No pertanent information unless stated regarding to the chief complaint.   Review of Systems:Review of systems updated and as accurate as of 06/18/16  No headache, visual changes, nausea, vomiting, diarrhea, constipation, dizziness, abdominal pain, skin rash, fevers, chills, night sweats, weight loss, swollen lymph nodes, body aches, joint swelling, muscle aches, chest pain, shortness of breath, mood changes.   Objective  Blood pressure 112/80, pulse 66, height 5' 10.5" (1.791 m), weight 137 lb (62.1 kg), SpO2 98 %. Systems examined below as of 06/18/16   General: No apparent distress alert and oriented x3 mood and affect normal, dressed appropriately.  HEENT: Pupils equal, extraocular movements intact  Respiratory: Patient's speak in full sentences and does not appear short of breath  Cardiovascular: No lower extremity edema, non tender, no erythema  Skin: Warm dry intact with no signs of infection or rash on extremities or on axial skeleton.  Abdomen: Soft nontender  Neuro: Cranial nerves II through XII are intact, neurovascularly intact in all extremities with 2+ DTRs and 2+ pulses.  Lymph: No lymphadenopathy of posterior or anterior cervical chain or axillae bilaterally.  Gait antalgic gait MSK:  Non tender with full range of motion and good stability and symmetric strength and tone of shoulders, elbows, wrist, , knee and ankles bilaterally. Arthritic changes noted  Patient's right hip has  full range of motion and full strength. Patient has a mild positive straight leg test the right compared to the contralateral side. Worsening pain with Corky Sox on the right side as well. Patient's left hip has minimal internal range of motion. Doesn't pain with internal range of motion of the left hip.   Impression and Recommendations:     This case required medical decision making of moderate complexity.      Note:  This dictation was prepared with Dragon dictation along with smaller phrase technology. Any transcriptional errors that result from this process are unintentional.

## 2016-06-18 ENCOUNTER — Ambulatory Visit (INDEPENDENT_AMBULATORY_CARE_PROVIDER_SITE_OTHER)
Admission: RE | Admit: 2016-06-18 | Discharge: 2016-06-18 | Disposition: A | Discharge status: Home / Self Care | Payer: PPO | Source: Ambulatory Visit | Attending: Family Medicine | Admitting: Family Medicine

## 2016-06-18 ENCOUNTER — Encounter: Payer: Self-pay | Admitting: Family Medicine

## 2016-06-18 ENCOUNTER — Ambulatory Visit (INDEPENDENT_AMBULATORY_CARE_PROVIDER_SITE_OTHER): Payer: PPO | Admitting: Family Medicine

## 2016-06-18 VITALS — BP 112/80 | HR 66 | Ht 70.5 in | Wt 137.0 lb

## 2016-06-18 DIAGNOSIS — M545 Low back pain: Secondary | ICD-10-CM | POA: Diagnosis not present

## 2016-06-18 DIAGNOSIS — M5416 Radiculopathy, lumbar region: Secondary | ICD-10-CM | POA: Insufficient documentation

## 2016-06-18 MED ORDER — GABAPENTIN 100 MG PO CAPS: 200.0000 mg | ORAL_CAPSULE | Freq: Every day | ORAL | 3 refills | Status: DC

## 2016-06-18 MED ORDER — PREDNISONE 50 MG PO TABS
50.0000 mg | ORAL_TABLET | Freq: Every day | ORAL | 0 refills | Status: DC
Start: 2016-06-18 — End: 2016-07-29

## 2016-06-18 NOTE — Assessment & Plan Note (Signed)
Patient is having more of a lumbar radiculopathy. Patient was started on prednisone gabapentin. I believe the patient's right hip is fine. X-rays of lumbar spine ordered today. We discussed icing regimen, patient will come back again in 1 week.  Improving we'll get her to start formal physical therapy again.

## 2016-06-18 NOTE — Patient Instructions (Signed)
Good to see you.  Ice 20 minutes 2 times daily. Usually after activity and before bed. Xray downstairs Prednisone daily for 5 days Gabapentin 200mg  at night Vitamin D 2000 IU daily  If any weakness or numbness occurs seek medical attention immediately.  See me again in 1 week (OK to double book) No exercises til Monday.

## 2016-06-24 NOTE — Progress Notes (Signed)
[name] [address] [place], [place] [address] Phone: [phone] Subjective:    I'm seeing this patient by the request  of:  Osborne Casco, MD   CC: Hip pain f/u  [id number]  Shelia [name] is a 75 y.o. female coming in with complaint of hip pain Patient was having more of a right-sided hip pain. Seems to be more of a lumbar radiculopathy. Started on prednisone as well as gabapentin. Discussed icing regimen. Had tramadol for breakthrough pain. Patient states The prednisone made very minimal improvement. Continues to have pain. States that the first movement in the morning is severe. Still has a radiation going down the posterior lateral aspect of the leg. Denies any weakness but states that she is having some bladder incontinence.   X-rays were ordered, reviewed and interpreted by me. X-rays taken 06/18/2016 showed moderate to severe degenerative disc disease from L4-S1.  Marland Kitchen  Patient's left hip x-rays in 2010 showed the patient had severe arthritis at that time. Still has not had any surgical intervention.  No past medical history on file. No past surgical history on file. Social History   Social History  . Marital status: Married    Spouse name: N/A  . Number of children: N/A  . Years of education: N/A   Social History Main Topics  . Smoking status: Former Research scientist (life sciences)  . Smokeless tobacco: None  . Alcohol use No  . Drug use: No  . Sexual activity: Not Asked   Other Topics Concern  . None   Social History Narrative  . None   No Known Allergies Family History  Problem Relation Age of Onset  . Hypertension Mother   . Cancer Father   . Diabetes Sister     Past medical history, social, surgical and family history all reviewed in electronic medical record.  No pertanent information unless stated regarding to the chief complaint.   Review of Systems:Review of systems updated and as accurate as of 06/25/16  No headache,  visual changes, nausea, vomiting, diarrhea, constipation, dizziness, abdominal pain, skin rash, fevers, chills, night sweats, weight loss, swollen lymph nodes, body aches, joint swelling, muscle aches, chest pain, shortness of breath, mood changes.   Objective  Blood pressure 112/74, pulse (!) 59, height 5\' 8"  (1.727 m), SpO2 97 %. Systems examined below as of 06/25/16   Systems examined below as of 06/25/16 General: NAD A&O x3 mood, affect normal  HEENT: Pupils equal, extraocular movements intact no nystagmus Respiratory: not short of breath at rest or with speaking Cardiovascular: No lower extremity edema, non tender Skin: Warm dry intact with no signs of infection or rash on extremities or on axial skeleton. Abdomen: Soft nontender, no masses Neuro: Cranial nerves  intact, neurovascularly intact Lymph: No lymphadenopathy appreciated today  Gait antalgic gait worsening MSK:  Non tender with full range of motion and good stability and symmetric strength and tone of shoulders, elbows, wrist, , knee and ankles bilaterally. Arthritic changes noted  Patient's right hip has full range of motion and full strength. Patient has a positive straight leg test the right compared to the contralateral side. Worsening pain with Corky Sox on the right side as well. Patient's left hip has minimal internal range of motion. Patient seendistal reflexes are 1+ on the right with the Achilles compared to the contralateral side.  patient strength of the lower extremity is 4 out of 5 but seems to be symmetric. Neurovascularly intact otherwise. Impression and Recommendations:  This case required medical decision making of moderate complexity.      Note: This dictation was prepared with Dragon dictation along with smaller phrase technology. Any transcriptional errors that result from this process are unintentional.

## 2016-06-25 ENCOUNTER — Encounter: Payer: Self-pay | Admitting: Family Medicine

## 2016-06-25 ENCOUNTER — Ambulatory Visit (INDEPENDENT_AMBULATORY_CARE_PROVIDER_SITE_OTHER): Payer: PPO | Admitting: Family Medicine

## 2016-06-25 VITALS — BP 112/74 | HR 59 | Ht 68.0 in

## 2016-06-25 DIAGNOSIS — M5416 Radiculopathy, lumbar region: Secondary | ICD-10-CM | POA: Diagnosis not present

## 2016-06-25 DIAGNOSIS — R32 Unspecified urinary incontinence: Secondary | ICD-10-CM

## 2016-06-25 NOTE — Assessment & Plan Note (Signed)
Worsening symptoms.  Patient did not respond well to the prednisone, patient is having urinary incontinence. This is concerning. We discussed with patient about the potential of going to the emergency room which patient declined. Patient knows that if any worsening continence occur she needs to seek medical attention immediately. Patient still did not want to go today and was being somewhat noncompliant. We discussed with patient about the gabapentin. Patient will decrease the dose just in case this is causing some of the urinary incontinence. I do feel that advance imaging is warranted with patient having increasing pain and failing conservative therapy as well as patient having radicular symptoms as well as urinary incontinence. Patient will have this MRI done and depending on findings we will discuss different treatment options thereafter.

## 2016-06-25 NOTE — Patient Instructions (Signed)
Good to see you  We will need MRI of your back  If the bladder get worse PLEASE GO TO THE EMERGENCY ROOM! Continue the tramadol with the tylenol  Decrease the gabapentin to see if it helps the bladder to one pill at night  Heat 20 minutes then ice 20 minutes and off for 2 hours.  Repeat as much as you want I would move forward with discussing the hip replacement.  Also I will be in contact with you about the MRI and may order the injection if I see an area where it will help.  Happy holidays!

## 2016-06-29 ENCOUNTER — Ambulatory Visit
Admission: RE | Admit: 2016-06-29 | Discharge: 2016-06-29 | Disposition: A | Discharge status: Home / Self Care | Payer: PPO | Source: Ambulatory Visit | Attending: Family Medicine | Admitting: Family Medicine

## 2016-06-29 DIAGNOSIS — M5416 Radiculopathy, lumbar region: Secondary | ICD-10-CM

## 2016-06-29 DIAGNOSIS — M48061 Spinal stenosis, lumbar region without neurogenic claudication: Secondary | ICD-10-CM | POA: Diagnosis not present

## 2016-06-30 ENCOUNTER — Encounter: Payer: Self-pay | Admitting: Family Medicine

## 2016-06-30 DIAGNOSIS — M5416 Radiculopathy, lumbar region: Secondary | ICD-10-CM

## 2016-07-01 MED ORDER — TRAMADOL HCL 50 MG PO TABS: 50.0000 mg | ORAL_TABLET | Freq: Four times a day (QID) | ORAL | 0 refills | Status: DC | PRN

## 2016-07-01 NOTE — Telephone Encounter (Signed)
We will order epidural at L4/5 please.  Refilled tramadol please fax when you can.  Thank you.

## 2016-07-02 ENCOUNTER — Encounter: Payer: Self-pay | Admitting: Family Medicine

## 2016-07-10 DIAGNOSIS — N950 Postmenopausal bleeding: Secondary | ICD-10-CM | POA: Diagnosis not present

## 2016-07-10 DIAGNOSIS — N760 Acute vaginitis: Secondary | ICD-10-CM | POA: Diagnosis not present

## 2016-07-11 ENCOUNTER — Ambulatory Visit
Admission: RE | Admit: 2016-07-11 | Discharge: 2016-07-11 | Disposition: A | Discharge status: Home / Self Care | Payer: PPO | Source: Ambulatory Visit | Attending: Family Medicine | Admitting: Family Medicine

## 2016-07-11 DIAGNOSIS — M545 Low back pain: Secondary | ICD-10-CM | POA: Diagnosis not present

## 2016-07-11 DIAGNOSIS — M5416 Radiculopathy, lumbar region: Secondary | ICD-10-CM

## 2016-07-11 MED ORDER — METHYLPREDNISOLONE ACETATE 40 MG/ML INJ SUSP (RADIOLOG
120.0000 mg | Freq: Once | INTRAMUSCULAR | Status: AC
  Administered 2016-07-11: 120 mg via EPIDURAL

## 2016-07-11 MED ORDER — IOPAMIDOL (ISOVUE-M 200) INJECTION 41%
1.0000 mL | Freq: Once | INTRAMUSCULAR | Status: AC
  Administered 2016-07-11: 1 mL via EPIDURAL

## 2016-07-11 NOTE — Discharge Instructions (Signed)

## 2016-07-15 ENCOUNTER — Encounter: Payer: Self-pay | Admitting: Family Medicine

## 2016-07-28 DIAGNOSIS — Z9289 Personal history of other medical treatment: Secondary | ICD-10-CM

## 2016-07-28 HISTORY — DX: Personal history of other medical treatment: Z92.89

## 2016-07-28 NOTE — Progress Notes (Signed)
[name] [address] [place], [place] [address] Phone: [phone] Subjective:    I'm seeing this patient by the request  of:  Osborne Casco, MD   CC: Hip pain f/u  [id number]  Shelia [name] is a 76 y.o. female coming in with complaint of hip pain Patient was having more of a right-sided hip pain. Seems to be more of a lumbar radiculopathy. Patient continued to have worsening symptoms and did have an MRI of the back. MRI of the lumbar spine showed the patient did have degenerative disc disease at L3-S1. Patient did have foraminal narrowing at L5-S1 on the right for right L5 nerve root impingement. This MRI was independently visualized by me. Patient then was given a right L4-L5 epidural injection. Patient's had this done 2 weeks ago. Patient states Doing much better. Feels like she is 75% better. Not having as much of the radicular symptoms. Still some mild weakness she states. Patient feels that this is likely secondary to the arthritis of the left hip. Patient is going to have that replaced in the near future.   Patient's left hip x-rays in 2010 showed the patient had severe arthritis at that time. Still has not had any surgical intervention.  No past medical history on file. No past surgical history on file. Social History   Social History  . Marital status: Married    Spouse name: N/A  . Number of children: N/A  . Years of education: N/A   Social History Main Topics  . Smoking status: Former Research scientist (life sciences)  . Smokeless tobacco: None  . Alcohol use No  . Drug use: No  . Sexual activity: Not Asked   Other Topics Concern  . None   Social History Narrative  . None   No Known Allergies Family History  Problem Relation Age of Onset  . Hypertension Mother   . Cancer Father   . Diabetes Sister     Past medical history, social, surgical and family history all reviewed in electronic medical record.  No pertanent information  unless stated regarding to the chief complaint.   Review of Systems: No headache, visual changes, nausea, vomiting, diarrhea, constipation, dizziness, abdominal pain, skin rash, fevers, chills, night sweats, weight loss, swollen lymph nodes, body aches, joint swelling, muscle aches, chest pain, shortness of breath, mood changes.    Objective  Blood pressure 100/70, pulse 80, height 5' 10.5" (1.791 m), weight 129 lb (58.5 kg), SpO2 97 %.   Systems examined below as of 07/29/16 General: NAD A&O x3 mood, affect normal  HEENT: Pupils equal, extraocular movements intact no nystagmus Respiratory: not short of breath at rest or with speaking Cardiovascular: No lower extremity edema, non tender Skin: Warm dry intact with no signs of infection or rash on extremities or on axial skeleton. Abdomen: Soft nontender, no masses Neuro: Cranial nerves  intact, neurovascularly intact in all extremities with 2+ DTRs and 2+ pulses. Lymph: No lymphadenopathy appreciated today  Gait still minorly antalgic MSK:  Non tender with full range of motion and good stability and symmetric strength and tone of shoulders, elbows, wrist, , knee and ankles bilaterally. Arthritic changes noted  Patient's right hip has full range of motion and full strength. Patient has a negative straight leg test today. Still has significant decreased range of motion of the left hip with significant pain with internal range of motion. Neurovascular intact distally with full strength of seems to be symmetric. Impression and Recommendations:  This case required medical decision making of moderate complexity.      Note: This dictation was prepared with Dragon dictation along with smaller phrase technology. Any transcriptional errors that result from this process are unintentional.

## 2016-07-29 ENCOUNTER — Encounter: Payer: Self-pay | Admitting: Family Medicine

## 2016-07-29 ENCOUNTER — Ambulatory Visit (INDEPENDENT_AMBULATORY_CARE_PROVIDER_SITE_OTHER): Payer: PPO | Admitting: Family Medicine

## 2016-07-29 VITALS — BP 100/70 | HR 80 | Ht 70.5 in | Wt 129.0 lb

## 2016-07-29 DIAGNOSIS — M5416 Radiculopathy, lumbar region: Secondary | ICD-10-CM | POA: Diagnosis not present

## 2016-07-29 NOTE — Patient Instructions (Addendum)
Good to see you  Happy New Year! Ice is your friend.  We will get you in with physical therapy to start with some strengthening.  [facility] will help a lot Send me a message when you know date of surgery and I will make sure you have a good team  See me again when you need me or if worsening symptoms we will get another injection.

## 2016-07-29 NOTE — Assessment & Plan Note (Signed)
Improvement at this time. Doing well overall. We'll start patient in formal physical therapy. Encourage her to continue with the gabapentin. Patient will continue to be active. I do feel that if patient gets left hip replaced she will have significant improvement in quality of life. We can repeat epidurals if needed. Otherwise follow-up as needed.

## 2016-07-29 NOTE — Assessment & Plan Note (Signed)
Seem specialist in the near future.

## 2016-08-01 DIAGNOSIS — M549 Dorsalgia, unspecified: Secondary | ICD-10-CM | POA: Diagnosis not present

## 2016-08-01 DIAGNOSIS — K2970 Gastritis, unspecified, without bleeding: Secondary | ICD-10-CM | POA: Diagnosis not present

## 2016-08-12 DIAGNOSIS — M25552 Pain in left hip: Secondary | ICD-10-CM | POA: Diagnosis not present

## 2016-08-13 ENCOUNTER — Ambulatory Visit: Payer: PPO | Admitting: Physical Therapy

## 2016-08-20 ENCOUNTER — Ambulatory Visit: Payer: PPO | Attending: Family Medicine | Admitting: Physical Therapy

## 2016-08-20 DIAGNOSIS — M6281 Muscle weakness (generalized): Secondary | ICD-10-CM | POA: Insufficient documentation

## 2016-08-20 DIAGNOSIS — M5441 Lumbago with sciatica, right side: Secondary | ICD-10-CM | POA: Diagnosis not present

## 2016-08-20 DIAGNOSIS — G8929 Other chronic pain: Secondary | ICD-10-CM | POA: Diagnosis not present

## 2016-08-20 DIAGNOSIS — M62830 Muscle spasm of back: Secondary | ICD-10-CM | POA: Insufficient documentation

## 2016-08-21 ENCOUNTER — Encounter: Payer: Self-pay | Admitting: Physical Therapy

## 2016-08-21 NOTE — Therapy (Signed)
[place], [place], [address] Phone: [phone]   Fax:  [phone]  Physical Therapy Evaluation  Patient Details  Name: Shelia [name] MRN: [id number] Date of Birth: 26-Jul-1941 Referring Provider: Dr Zenovia Jarred   Encounter Date: 08/20/2016      PT End of Session - 08/20/16 0810    Visit Number 1   Number of Visits 16   Date for PT Re-Evaluation 10/16/16   Authorization Type healthstream medicare    PT Start Time 0800   PT Stop Time 0843   PT Time Calculation (min) 43 min   Activity Tolerance Patient tolerated treatment well   Behavior During Therapy [facility] for tasks assessed/performed      No past medical history on file.  No past surgical history on file.  There were no vitals filed for this visit.       Subjective Assessment - 08/20/16 0813    Subjective Patient has a history of lower back pain and left hip pain. She will have the left hip replaced in the near future. She does YOGA. Some of her YOGA poses cause her pain. She would like an exercise program that she can fdo at home that will help her but not inflame her back.    Limitations House hold activities   How long can you walk comfortably? walking about 2 miles which seemed to cause    Diagnostic tests MRI: Pinched disc at L5    Patient Stated Goals To get back to normal activity    Currently in Pain? Yes   Pain Score 7    Pain Location Back   Pain Orientation Right   Pain Onset More than a month ago   Pain Frequency Intermittent   Aggravating Factors  standing/ walkin g   Multiple Pain Sites No            [facility] PT Assessment - 08/21/16 0001      Assessment   Medical Diagnosis Low back pain    Referring Provider Dr Rosamaria Lints Simth    Onset Date/Surgical Date --  long term care    Hand Dominance Right   Next MD Visit None schedued   Prior Therapy None      Precautions   Precautions None     Balance Screen   Has the patient fallen in the past 6 months No   How many times? 1  1 fall recently    Has the patient had a decrease in activity level because of a fear of falling?  No   Is the patient reluctant to leave their home because of a fear of falling?  No     Home Social worker Private residence   Living Arrangements Spouse/significant other   Additional Comments Steps to her basement which casue her some left hip pain      Prior Function   Level of Independence Independent   Vocation Retired   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 Has gotten orthotics but they were not comfortable.    Focus on Therapeutic Outcomes (FOTO)  Not given by front  Sensation   Additional Comments has had radicular pain in the past but is not having any at this time     Coordination   Gross Motor Movements are Fluid and Coordinated No   Fine Motor Movements are Fluid and Coordinated No     ROM / Strength   AROM / PROM / Strength AROM;PROM;Strength     AROM   AROM Assessment Site Lumbar   Lumbar Flexion Normal    Lumbar Extension pain with end range extension    Lumbar - Right Side Bend WNL    Lumbar - Left Side Bend WNL    Lumbar - Right Rotation WNL    Lumbar - Left Rotation WNL      PROM   Overall PROM Comments Passive left hip flexion IR and ER limited compared to right      Strength   Strength Assessment Site Hip;Knee   Right/Left Hip Right;Left   Right Hip Flexion 4/5   Right Hip Extension 3+/5   Right Hip ABduction 4+/5   Right Hip ADduction 5/5   Left Hip Flexion 4-/5   Left Hip Extension 3/5   Left Hip ABduction 3+/5    Left Hip ADduction 5/5     Palpation   Palpation comment spasming flet in the lateral right upper glut area      Special Tests    Special Tests Leg LengthTest   Leg length test  Apparent     Apparent   Comments Leg appears to be shorter on the left     Ambulation/Gait   Gait Comments lateral movement with gait.                    [place] Adult PT Treatment/Exercise - 08/21/16 0001      Lumbar Exercises: Stretches   Single Knee to Chest Stretch Limitations 2x20sec hold    Piriformis Stretch Limitations 2x20sec holfd      Lumbar Exercises: Supine   AB Set Limitations posterior pelvic tilit/ pre vridge 2x10    Clam Limitations 2x10 red    Heel Slides Limitations 2x10 education on progressing difficult                PT Education - 08/21/16 1039    Education provided Yes   Education Details HEP, symptom management    Person(s) Educated Patient;Parent(s)   Methods Demonstration;Explanation;Tactile cues;Verbal cues   Comprehension Verbalized understanding;Verbal cues required;Returned demonstration          PT Short Term Goals - 08/21/16 1051      PT SHORT TERM GOAL #1   Title Patient will report < 2/10 pain in the lumbar spine    Time 4   Period Weeks   Status New     PT SHORT TERM GOAL #2   Title Patient will be independent with initial HEP for core stability    Time 4   Period Weeks   Status New     PT SHORT TERM GOAL #3   Title Patient will demsotrate 5/5 right LE and 4+/5 L LE strength gross    Time 4   Period Weeks   Status New           PT Long Term Goals - 08/21/16 1053      PT LONG TERM GOAL #1   Title Patient will demonstrate proper technique with home program for back and hip strengthening    Time 8   Period Weeks   Status New  PT LONG TERM GOAL #2   Title Patient will ambulate 2 miles without increased pain in order to perfrom IADL's and walk for exercises    Time 8   Period Weeks   Status New     PT LONG TERM  GOAL #3   Title Patient will stand for 1 hour without increased pain in order to perfrom IADL's    Time 8   Period Weeks   Status New               Plan - 08/21/16 1042    Clinical Impression Statement Patient is a 76 year old female with lower back pain. She has pain in her left hip. She will be having a left hip replacement soon. She was seen for therapy but feels high level exercises caused her an increase in pain. She is afraid to do activity at this time. She would like a plan that guides her in what to do to sttrengthening her core. She presents with tightness in bilateral hips. She has spemaing into her right lateral glut. She had radiuclar pain but it is better after recieving a epidural. She would benefit from skilled therapy to improve her core strength and for an HEP. She was seen for a low complexity HEP.    Rehab Potential Good   PT Frequency 2x / week   PT Duration 8 weeks   PT Treatment/Interventions ADLs/Self Care Home Management;Cryotherapy;Electrical Stimulation;Iontophoresis 4mg /ml Dexamethasone;Moist Heat;Ultrasound;Gait training;Therapeutic activities;Therapeutic exercise;Cognitive remediation;Patient/family education;Manual techniques;Dry needling;Splinting;Taping;Passive range of motion;Traction   PT Next Visit Plan progress light strengthening exercises. Assess tolerance to new exercises    PT Home Exercise Plan heel slide with TA, pre brideg pelvic tilt, Hip abdcution with band; Hip ER stretch, single knee to chest stretch, piriformis stretch.    Consulted and Agree with Plan of Care Patient      Patient will benefit from skilled therapeutic intervention in order to improve the following deficits and impairments:  Abnormal gait, Decreased mobility, Increased muscle spasms, Decreased strength, Difficulty walking, Increased fascial restricitons, Decreased knowledge of use of DME, Decreased activity tolerance, Decreased range of motion, Impaired flexibility  Visit  Diagnosis: Chronic right-sided low back pain with right-sided sciatica - Plan: PT plan of care cert/re-cert  Muscle weakness (generalized) - Plan: PT plan of care cert/re-cert  Muscle spasm of back - Plan: PT plan of care cert/re-cert     Problem List Patient Active Problem List   Diagnosis Date Noted  . Urinary incontinence 06/25/2016  . Lumbar radiculopathy 06/18/2016  . 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 Abnormality of gait 02/02/2013  . Nonallopathic lesion of lumbar region 04/14/2011  . OSTEOARTHRITIS, HIP, LEFT 11/14/2009  . METATARSALGIA 11/14/2009  . BUNION, RIGHT FOOT 11/14/2009  . HIP PAIN, LEFT 10/02/2009  . Pain in joint, lower leg 10/02/2009  . LOW BACK PAIN, CHRONIC 10/02/2009    Carney Living  PT DPT  08/21/2016, 1:17 PM  [facility] [address] [place], [place], [address] Phone: [phone]   Fax:  [phone]  Name: Shelia [name] MRN: [id number] Date of Birth: [birth year]

## 2016-08-27 ENCOUNTER — Ambulatory Visit: Payer: PPO | Admitting: Physical Therapy

## 2016-08-27 DIAGNOSIS — M6281 Muscle weakness (generalized): Secondary | ICD-10-CM

## 2016-08-27 DIAGNOSIS — G8929 Other chronic pain: Secondary | ICD-10-CM

## 2016-08-27 DIAGNOSIS — M62830 Muscle spasm of back: Secondary | ICD-10-CM

## 2016-08-27 DIAGNOSIS — M5441 Lumbago with sciatica, right side: Secondary | ICD-10-CM | POA: Diagnosis not present

## 2016-08-27 NOTE — Therapy (Signed)
[place], [place], [address] Phone: [phone]   Fax:  [phone]  Physical Therapy Treatment  Patient Details  Name: Shelia [name] MRN: [id number] Date of Birth: 1940-08-01 Referring Provider: Dr Zenovia Jarred   Encounter Date: 08/27/2016      PT End of Session - 08/27/16 1502    Visit Number 2   Number of Visits 16   Date for PT Re-Evaluation 10/16/16   Authorization Type [employer] medicare    PT Start Time 1432   PT Stop Time 1527   PT Time Calculation (min) 55 min   Activity Tolerance Patient tolerated treatment well   Behavior During Therapy [facility] for tasks assessed/performed      No past medical history on file.  No past surgical history on file.  There were no vitals filed for this visit.      Subjective Assessment - 08/27/16 1438    Subjective She has just some achy pain, has been driving around.  PT regimen was too strenuous last Nov. 2017.  Been doing her exercises.    Currently in Pain? Yes   Pain Score 3    Pain Location Back   Pain Orientation Right;Left;Lower   Pain Descriptors / Indicators Aching   Pain Type Chronic pain   Pain Onset More than a month ago   Pain Frequency Intermittent   Aggravating Factors  standing, walking, bending    Pain Relieving Factors stretching   Effect of Pain on Daily Activities has been dealing with this a long time, wants to get ready for hip replacement in March ?   Multiple Pain Sites No                         [facility] Adult PT Treatment/Exercise - 08/27/16 1507      Self-Care   Self-Care Posture;Other Self-Care Comments   Posture neutral spine    Other Self-Care Comments  Pilates      Lumbar Exercises: Stretches   Single Knee to Chest Stretch 3 reps;20 seconds   Double Knee to Chest Stretch 1 rep;30 seconds   Pelvic Tilt 10 seconds   Piriformis Stretch 2 reps;30 seconds   Piriformis Stretch Limitations 2 x 30  sec knee over knee and rotation      Lumbar Exercises: Supine   Ab Set 10 reps   AB Set Limitations PPT x 10    Clam 20 reps   Heel Slides 10 reps   Bridge 10 reps   Bridge Limitations bridge and clam x 10      Manual Therapy   Soft tissue mobilization lumbar parapsinals and Rt. gluteals , mod pressure in prone to reduce muscle tension                 PT Education - 08/27/16 1531    Education provided Yes   Education Details Pilates, HEP modification and neutral spine, core control    Person(s) Educated Patient   Methods Explanation   Comprehension Verbalized understanding;Returned demonstration          PT Short Term Goals - 08/27/16 1536      PT SHORT TERM GOAL #1   Title Patient will report < 2/10 pain in the lumbar spine    Status On-going     PT SHORT TERM GOAL #2   Title Patient will be independent with initial HEP for core stability    Status On-going  PT SHORT TERM GOAL #3   Title Patient will demonstrate 5/5 right LE and 4+/5 L LE strength gross    Status On-going           PT Long Term Goals - 08/27/16 1536      PT LONG TERM GOAL #1   Title Patient will demonstrate proper technique with home program for back and hip strengthening    Status On-going     PT LONG TERM GOAL #2   Title Patient will ambulate 2 miles without increased pain in order to perfrom IADL's and walk for exercises    Status On-going     PT LONG TERM GOAL #3   Title Patient will stand for 1 hour without increased pain in order to perform IADL's    Status On-going               Plan - 08/27/16 1533    Clinical Impression Statement Patient able to do HEP wiht min correction, did not fully understand activation of core muscles.  She was a bit afraid of flaring up her Rt. side which was so painful in Nov.    PT Next Visit Plan Try Supine Pilates Reformer, repeat manual if needed   PT Home Exercise Plan heel slide with TA, pre brideg pelvic tilt, Hip abdcution  with band; Hip ER stretch, single knee to chest stretch, piriformis stretch.    Consulted and Agree with Plan of Care Patient      Patient will benefit from skilled therapeutic intervention in order to improve the following deficits and impairments:  Abnormal gait, Decreased mobility, Increased muscle spasms, Decreased strength, Difficulty walking, Increased fascial restricitons, Decreased knowledge of use of DME, Decreased activity tolerance, Decreased range of motion, Impaired flexibility  Visit Diagnosis: Chronic right-sided low back pain with right-sided sciatica  Muscle spasm of back  Muscle weakness (generalized)     Problem List Patient Active Problem List   Diagnosis Date Noted  . Urinary incontinence 06/25/2016  . Lumbar radiculopathy 06/18/2016  . 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 Abnormality of gait 02/02/2013  . Nonallopathic lesion of lumbar region 04/14/2011  . OSTEOARTHRITIS, HIP, LEFT 11/14/2009  . METATARSALGIA 11/14/2009  . BUNION, RIGHT FOOT 11/14/2009  . HIP PAIN, LEFT 10/02/2009  . Pain in joint, lower leg 10/02/2009  . LOW BACK PAIN, CHRONIC 10/02/2009    Tarnesha Ulloa 08/27/2016, 3:39 PM  [facility] [address] [place], [place], [address] Phone: [phone]   Fax:  [phone]  Name: CHERYLN PUTNAM MRN: [id number] Date of Birth: 1941-01-14  [name], PT 08/27/16 3:40 PM Phone: [phone] Fax: [phone]

## 2016-08-28 ENCOUNTER — Other Ambulatory Visit: Payer: Self-pay | Admitting: Orthopedic Surgery

## 2016-09-03 ENCOUNTER — Ambulatory Visit: Payer: PPO | Admitting: Physical Therapy

## 2016-09-03 DIAGNOSIS — K2970 Gastritis, unspecified, without bleeding: Secondary | ICD-10-CM | POA: Diagnosis not present

## 2016-09-05 ENCOUNTER — Ambulatory Visit: Payer: PPO | Attending: Family Medicine | Admitting: Physical Therapy

## 2016-09-05 DIAGNOSIS — M5441 Lumbago with sciatica, right side: Secondary | ICD-10-CM | POA: Diagnosis not present

## 2016-09-05 DIAGNOSIS — M6281 Muscle weakness (generalized): Secondary | ICD-10-CM | POA: Insufficient documentation

## 2016-09-05 DIAGNOSIS — G8929 Other chronic pain: Secondary | ICD-10-CM

## 2016-09-05 DIAGNOSIS — M62830 Muscle spasm of back: Secondary | ICD-10-CM | POA: Diagnosis not present

## 2016-09-05 NOTE — Therapy (Signed)
[place], [place], [address] Phone: [phone]   Fax:  [phone]  Physical Therapy Treatment  Patient Details  Name: Shelia [name] MRN: [id number] Date of Birth: [birth year] Referring Provider: Dr Zenovia Jarred   Encounter Date: 09/05/2016      PT End of Session - 09/05/16 1044    Visit Number 3   Number of Visits 16   Date for PT Re-Evaluation 10/16/16   Authorization Type [employer] medicare    PT Start Time 1026   PT Stop Time 1105   PT Time Calculation (min) 39 min   Activity Tolerance Patient tolerated treatment well   Behavior During Therapy [facility] for tasks assessed/performed      No past medical history on file.  No past surgical history on file.  There were no vitals filed for this visit.      Subjective Assessment - 09/05/16 1027    Subjective Pt arr 10 min late.  Not taking Tylenol.  Yesterday i did something as I was putting my socks on and I did something to my back. I am concerned about my balance    Currently in Pain? Yes   Pain Score 4    Pain Location Back       Pilates Reformer used for LE/core strength, postural strength, lumbopelvic disassociation and core control.  Exercises included:  Footwork 2 Red 1 blue parallel and turn out heels and forefoot, challenging for Lt LE (hip limited in ER and has mod knee pain) tendency for lumbar flexion   Bridging 2 red  1 blue articulating bridge x 10  Supine Arm work 1 [employer] and T  Feet in Straps 1 [employer],  Increased time spent on ensuring neutral spine          PT Short Term Goals - 09/05/16 1047      PT SHORT TERM GOAL #1   Title Patient will report < 2/10 pain in the lumbar spine    Status On-going     PT SHORT TERM GOAL #2   Title Patient will be independent with initial HEP for core stability    Status Achieved     PT SHORT TERM GOAL #3   Title Patient will demonstrate 5/5 right LE and 4+/5 L LE  strength gross    Status On-going           PT Long Term Goals - 09/05/16 1048      PT LONG TERM GOAL #1   Title Patient will demonstrate proper technique with home program for back and hip strengthening    Status On-going     PT LONG TERM GOAL #2   Title Patient will ambulate 2 miles without increased pain in order to perfrom IADL's and walk for exercises    Status On-going     PT LONG TERM GOAL #3   Title Patient will stand for 1 hour without increased pain in order to perform IADL's    Status On-going               Plan - 09/05/16 1029    Clinical Impression Statement Pt with limitations in L hip ROM.  She had some degree of difficulty with basic PIlates Reformer exercises. No increased pain post session.    PT Next Visit Plan Try Supine Pilates Reformer again, Berg   PT Home Exercise Plan heel slide with TA, pre brideg pelvic tilt, Hip abdcution with band; Hip ER stretch,  single knee to chest stretch, piriformis stretch.    Consulted and Agree with Plan of Care Patient      Patient will benefit from skilled therapeutic intervention in order to improve the following deficits and impairments:  Abnormal gait, Decreased mobility, Increased muscle spasms, Decreased strength, Difficulty walking, Increased fascial restricitons, Decreased knowledge of use of DME, Decreased activity tolerance, Decreased range of motion, Impaired flexibility  Visit Diagnosis: Chronic right-sided low back pain with right-sided sciatica  Muscle spasm of back  Muscle weakness (generalized)     Problem List Patient Active Problem List   Diagnosis Date Noted  . Urinary incontinence 06/25/2016  . Lumbar radiculopathy 06/18/2016  . 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 Abnormality of gait 02/02/2013  . Nonallopathic lesion of lumbar region 04/14/2011  . OSTEOARTHRITIS, HIP, LEFT 11/14/2009  . METATARSALGIA 11/14/2009  . BUNION, RIGHT FOOT 11/14/2009  . HIP PAIN, LEFT 10/02/2009  . Pain  in joint, lower leg 10/02/2009  . LOW BACK PAIN, CHRONIC 10/02/2009    Ranier Coach 09/05/2016, 12:12 PM  [facility] [address] [place], [place], [address] Phone: [phone]   Fax:  [phone]  Name: Shelia [name] MRN: [id number] Date of Birth: 10-Jul-1941  [name], PT 09/05/16 12:12 PM Phone: [phone] Fax: [phone]

## 2016-09-08 DIAGNOSIS — M85852 Other specified disorders of bone density and structure, left thigh: Secondary | ICD-10-CM | POA: Diagnosis not present

## 2016-09-10 ENCOUNTER — Ambulatory Visit: Payer: PPO | Admitting: Physical Therapy

## 2016-09-10 DIAGNOSIS — M62830 Muscle spasm of back: Secondary | ICD-10-CM

## 2016-09-10 DIAGNOSIS — G8929 Other chronic pain: Secondary | ICD-10-CM

## 2016-09-10 DIAGNOSIS — M5441 Lumbago with sciatica, right side: Secondary | ICD-10-CM | POA: Diagnosis not present

## 2016-09-10 DIAGNOSIS — M6281 Muscle weakness (generalized): Secondary | ICD-10-CM

## 2016-09-10 NOTE — Therapy (Signed)
[place], [place], [address] Phone: [phone]   Fax:  [phone]  Physical Therapy Treatment  Patient Details  Name: Shelia [name] MRN: [id number] Date of Birth: 1940/09/03 Referring Provider: Dr Zenovia Jarred   Encounter Date: 09/10/2016      PT End of Session - 09/10/16 1015    Visit Number 4   Number of Visits 16   Date for PT Re-Evaluation 10/16/16   Authorization Type [employer] medicare    PT Start Time 1015   PT Stop Time 1100   PT Time Calculation (min) 45 min   Activity Tolerance Patient tolerated treatment well   Behavior During Therapy [facility] for tasks assessed/performed      No past medical history on file.  No past surgical history on file.  There were no vitals filed for this visit.      Subjective Assessment - 09/10/16 1240    Subjective Doing OK today, no increased pain after last session. I feel like I lean to the Lt, which is the presurgical one.             [facility] PT Assessment - 09/10/16 1020      Standardized Balance Assessment   Standardized Balance Assessment Berg Balance Test     Berg Balance Test   Sit to Stand Able to stand without using hands and stabilize independently   Standing Unsupported Able to stand safely 2 minutes   Sitting with Back Unsupported but Feet Supported on Floor or Stool Able to sit safely and securely 2 minutes   Stand to Sit Sits safely with minimal use of hands   Transfers Able to transfer safely, minor use of hands   Standing Unsupported with Eyes Closed Able to stand 10 seconds safely   Standing Ubsupported with Feet Together Able to place feet together independently and stand 1 minute safely   From Standing, Reach Forward with Outstretched Arm Can reach confidently >25 cm (10")   From Standing Position, Pick up Object from Floor Able to pick up shoe safely and easily   From Standing Position, Turn to Look Behind Over each  Shoulder Looks behind from both sides and weight shifts well   Turn 360 Degrees Able to turn 360 degrees safely in 4 seconds or less   Standing Unsupported, Alternately Place Feet on Step/Stool Able to stand independently and safely and complete 8 steps in 20 seconds   Standing Unsupported, One Foot in Front Able to place foot tandem independently and hold 30 seconds   Standing on One Leg Able to lift leg independently and hold > 10 seconds   Total Score 56     Dynamic Gait Index   Change in Gait Speed Normal   Gait with Horizontal Head Turns Normal   Gait with Vertical Head Turns Normal   Gait and Pivot Turn Normal   Step Over Obstacle Normal   Step Around Obstacles Normal   Steps Normal   DGI comment: 27                     [facility] Adult PT Treatment/Exercise - 09/10/16 1044      Lumbar Exercises: Machines for Strengthening   Other Lumbar Machine Exercise Pilates Reformer see note      Moist Heat Therapy   Number Minutes Moist Heat 10 Minutes   Moist Heat Location Lumbar Spine      Pilates Reformer used for LE/core strength, postural strength, lumbopelvic  disassociation and core control.  Exercises included:  Footwork single leg in supine 2 Red, also done in sidelying 1 red 1 blue for lateral hip strength and core control.   Bridging 3 springs x 10  Supine Arm work 1 Red Arm arcs Mod cues to fully extend elbows and engage scapula, tends to lose control of lower body, use momentum.               PT Education - 09/10/16 1244    Education provided Yes   Education Details Balance assessment, result and causes of feeling imbalanced.    Person(s) Educated Patient   Methods Explanation   Comprehension Verbalized understanding          PT Short Term Goals - 09/05/16 1047      PT SHORT TERM GOAL #1   Title Patient will report < 2/10 pain in the lumbar spine    Status On-going     PT SHORT TERM GOAL #2   Title Patient will be independent with initial  HEP for core stability    Status Achieved     PT SHORT TERM GOAL #3   Title Patient will demonstrate 5/5 right LE and 4+/5 L LE strength gross    Status On-going           PT Long Term Goals - 09/05/16 1048      PT LONG TERM GOAL #1   Title Patient will demonstrate proper technique with home program for back and hip strengthening    Status On-going     PT LONG TERM GOAL #2   Title Patient will ambulate 2 miles without increased pain in order to perfrom IADL's and walk for exercises    Status On-going     PT LONG TERM GOAL #3   Title Patient will stand for 1 hour without increased pain in order to perform IADL's    Status On-going               Plan - 09/10/16 1016    Clinical Impression Statement Berg and DGI score completely normal.  No need for follow up but likely due to pain and decreased function of L hip.  Surgery 09/29/16.    PT Next Visit Plan progress core and hip with Pilates   PT Home Exercise Plan heel slide with TA, pre brideg pelvic tilt, Hip abdcution with band; Hip ER stretch, single knee to chest stretch, piriformis stretch.    Consulted and Agree with Plan of Care Patient      Patient will benefit from skilled therapeutic intervention in order to improve the following deficits and impairments:  Abnormal gait, Decreased mobility, Increased muscle spasms, Decreased strength, Difficulty walking, Increased fascial restricitons, Decreased knowledge of use of DME, Decreased activity tolerance, Decreased range of motion, Impaired flexibility  Visit Diagnosis: Chronic right-sided low back pain with right-sided sciatica  Muscle spasm of back  Muscle weakness (generalized)     Problem List Patient Active Problem List   Diagnosis Date Noted  . Urinary incontinence 06/25/2016  . Lumbar radiculopathy 06/18/2016  . 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 Abnormality of gait 02/02/2013  . Nonallopathic lesion of lumbar region 04/14/2011  . OSTEOARTHRITIS,  HIP, LEFT 11/14/2009  . METATARSALGIA 11/14/2009  . BUNION, RIGHT FOOT 11/14/2009  . HIP PAIN, LEFT 10/02/2009  . Pain in joint, lower leg 10/02/2009  . LOW BACK PAIN, CHRONIC 10/02/2009    Shelia [name] 09/10/2016, 12:47 PM  [place] [facility]  [place], [place], [address] Phone: [phone]   Fax:  [phone]  Name: Shelia [name] MRN: [id number] Date of Birth: 02-18-1941  [name], PT 09/10/16 12:51 PM Phone: [phone] Fax: [phone]

## 2016-09-11 ENCOUNTER — Other Ambulatory Visit: Payer: Self-pay | Admitting: Gastroenterology

## 2016-09-11 DIAGNOSIS — R1013 Epigastric pain: Secondary | ICD-10-CM | POA: Diagnosis not present

## 2016-09-11 DIAGNOSIS — R1310 Dysphagia, unspecified: Secondary | ICD-10-CM

## 2016-09-15 ENCOUNTER — Ambulatory Visit
Admission: RE | Admit: 2016-09-15 | Discharge: 2016-09-15 | Disposition: A | Discharge status: Home / Self Care | Payer: PPO | Source: Ambulatory Visit | Attending: Gastroenterology | Admitting: Gastroenterology

## 2016-09-15 DIAGNOSIS — K219 Gastro-esophageal reflux disease without esophagitis: Secondary | ICD-10-CM | POA: Diagnosis not present

## 2016-09-15 DIAGNOSIS — R1310 Dysphagia, unspecified: Secondary | ICD-10-CM

## 2016-09-16 ENCOUNTER — Encounter: Payer: Self-pay | Admitting: Family Medicine

## 2016-09-17 ENCOUNTER — Ambulatory Visit: Payer: PPO | Attending: Family Medicine | Admitting: Physical Therapy

## 2016-09-17 DIAGNOSIS — G8929 Other chronic pain: Secondary | ICD-10-CM

## 2016-09-17 DIAGNOSIS — M5441 Lumbago with sciatica, right side: Secondary | ICD-10-CM | POA: Diagnosis not present

## 2016-09-17 DIAGNOSIS — M6281 Muscle weakness (generalized): Secondary | ICD-10-CM

## 2016-09-17 DIAGNOSIS — M62830 Muscle spasm of back: Secondary | ICD-10-CM

## 2016-09-17 NOTE — Therapy (Signed)
[place], [place], [address] Phone: [phone]   Fax:  [phone]  Physical Therapy Treatment  Patient Details  Name: Shelia [name] MRN: [id number] Date of Birth: 1941/02/16 Referring Provider: Dr Zenovia Jarred   Encounter Date: 09/17/2016      PT End of Session - 09/17/16 1020    Visit Number 5   Number of Visits 16   Date for PT Re-Evaluation 10/16/16   Authorization Type healthstream medicare    PT Start Time 1015   PT Stop Time 1110   PT Time Calculation (min) 55 min   Activity Tolerance Patient tolerated treatment well   Behavior During Therapy [facility] for tasks assessed/performed      No past medical history on file.  No past surgical history on file.  There were no vitals filed for this visit.      Subjective Assessment - 09/17/16 1302    Subjective Typical pain today, 3/10.     Currently in Pain? Yes   Pain Score 3    Pain Location Back   Pain Orientation Right;Left;Lower   Pain Descriptors / Indicators Aching;Discomfort   Pain Type Chronic pain   Pain Onset More than a month ago   Pain Frequency Intermittent   Aggravating Factors  standing, walking and bending    Pain Relieving Factors stretch, heat       Pilates Tower for LE/Core strength, postural strength, lumbopelvic disassociation and core control.  Exercises included: [place]: Supine Leg [place] single leg work, had some mild degree of knee pain on R side  Sidelying Leg [place] sidekick and arcs, circles mod cues for alignment  Supine Arms in hooklying x 10  Ball squeeze, bridge x 10  Arm arcs and bridge  2 x 5 sets  Roll down for spinal articulation, difficulty in thoracic spine , no pain increase x 8 reps       [facility] Adult PT Treatment/Exercise - 09/17/16 1309      Lumbar Exercises: Stretches   Active Hamstring Stretch 2 reps;30 seconds   Piriformis Stretch 2 reps;30 seconds     Moist Heat Therapy   Number Minutes Moist Heat 10 Minutes   Moist Heat Location Lumbar Spine         PT Education - 09/17/16 1303    Education provided Yes   Education Details simple HEP up until and also post surgery. Pilates reinforcement    Person(s) Educated Patient   Methods Explanation   Comprehension Verbalized understanding;Need further instruction          PT Short Term Goals - 09/17/16 1026      PT SHORT TERM GOAL #1   Title Patient will report < 2/10 pain in the lumbar spine    Status On-going     PT SHORT TERM GOAL #2   Title Patient will be independent with initial HEP for core stability    Status Achieved     PT SHORT TERM GOAL #3   Title Patient will demonstrate 5/5 right LE and 4+/5 L LE strength gross    Status Unable to assess           PT Long Term Goals - 09/17/16 1024      PT LONG TERM GOAL #1   Title Patient will demonstrate proper technique with home program for back and hip strengthening    Status On-going     PT LONG TERM GOAL #2   Title Patient will ambulate  2 miles without increased pain in order to perfrom IADL's and walk for exercises    Baseline walking increases achiness but not pain (its managable)    Status On-going     PT LONG TERM GOAL #3   Title Patient will stand for 1 hour without increased pain in order to perform IADL's    Status On-going               Plan - 09/17/16 1304    Clinical Impression Statement Will come one more time prior to hip surgery.  L hip weaker and requires more attention to control with Leg [place] on [employer].    PT Next Visit Plan progress core and hip with Pilates, make sure her HEP is complete and she is I    PT Home Exercise Plan heel slide with TA, pre brideg pelvic tilt, Hip abdcution with band; Hip ER stretch, single knee to chest stretch, piriformis stretch.    Consulted and Agree with Plan of Care Patient      Patient will benefit from skilled therapeutic intervention in order to improve the following  deficits and impairments:  Abnormal gait, Decreased mobility, Increased muscle spasms, Decreased strength, Difficulty walking, Increased fascial restricitons, Decreased knowledge of use of DME, Decreased activity tolerance, Decreased range of motion, Impaired flexibility, Pain  Visit Diagnosis: Chronic right-sided low back pain with right-sided sciatica  Muscle spasm of back  Muscle weakness (generalized)     Problem List Patient Active Problem List   Diagnosis Date Noted  . Urinary incontinence 06/25/2016  . Lumbar radiculopathy 06/18/2016  . 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 Abnormality of gait 02/02/2013  . Nonallopathic lesion of lumbar region 04/14/2011  . OSTEOARTHRITIS, HIP, LEFT 11/14/2009  . METATARSALGIA 11/14/2009  . BUNION, RIGHT FOOT 11/14/2009  . HIP PAIN, LEFT 10/02/2009  . Pain in joint, lower leg 10/02/2009  . LOW BACK PAIN, CHRONIC 10/02/2009    [name],[name] 09/17/2016, 3:07 PM  [facility] [address] [place], [place], [address] Phone: [phone]   Fax:  [phone]  Name: Shelia [name] MRN: [id number] Date of Birth: Aug 01, 1940  [name], PT 09/17/16 3:07 PM Phone: [phone] Fax: [phone]

## 2016-09-19 ENCOUNTER — Encounter: Payer: PPO | Admitting: Physical Therapy

## 2016-09-19 NOTE — Pre-Procedure Instructions (Signed)
Shelia [name]  09/19/2016      [id number] - [place], [place] - [place] [address] Phone: [phone] Fax: [phone]    Your procedure is scheduled on Mon. Mar. 5  Report to [facility] Admitting at 12 P.M.  Call this number if you have problems the morning of surgery:  [phone]   Remember:  Do not eat food or drink liquids after midnight.  Take these medicines the morning of surgery with A SIP OF WATER : claritin if needed,omeprazole (prilosec)              1 week prior to surgery stop advil, motrin, ibuprofen, aleve, BC Powders, Goody's, turmeric, vitamins/herbal medicines.   Do not wear jewelry, make-up or nail polish.  Do not wear lotions, powders, or perfumes, or deoderant.  Do not shave 48 hours prior to surgery.  Men may shave face and neck.  Do not bring valuables to the hospital.  [facility] is not responsible for any belongings or valuables.  Contacts, dentures or bridgework may not be worn into surgery.  Leave your suitcase in the car.  After surgery it may be brought to your room.  For patients admitted to the hospital, discharge time will be determined by your treatment team.  Patients discharged the day of surgery will not be allowed to drive home.    Special instructions:   - Preparing For Surgery  Before surgery, you can play an important role. Because skin is not sterile, your skin needs to be as free of germs as possible. You can reduce the number of germs on your skin by washing with CHG (chlorahexidine gluconate) Soap before surgery.  CHG is an antiseptic cleaner which kills germs and bonds with the skin to continue killing germs even after washing.  Please do not use if you have an allergy to CHG or antibacterial soaps. If your skin becomes reddened/irritated stop using the CHG.  Do not shave (including legs and underarms) for at least 48 hours prior to first CHG  shower. It is OK to shave your face.  Please follow these instructions carefully.   1. Shower the NIGHT BEFORE SURGERY and the MORNING OF SURGERY with CHG.   2. If you chose to wash your hair, wash your hair first as usual with your normal shampoo.  3. After you shampoo, rinse your hair and body thoroughly to remove the shampoo.  4. Use CHG as you would any other liquid soap. You can apply CHG directly to the skin and wash gently with a scrungie or a clean washcloth.   5. Apply the CHG Soap to your body ONLY FROM THE NECK DOWN.  Do not use on open wounds or open sores. Avoid contact with your eyes, ears, mouth and genitals (private parts). Wash genitals (private parts) with your normal soap.  6. Wash thoroughly, paying special attention to the area where your surgery will be performed.  7. Thoroughly rinse your body with warm water from the neck down.  8. DO NOT shower/wash with your normal soap after using and rinsing off the CHG Soap.  9. Pat yourself dry with a CLEAN TOWEL.   10. Wear CLEAN PAJAMAS   11. Place CLEAN SHEETS on your bed the night of your first shower and DO NOT SLEEP WITH PETS.    Day of Surgery: Do not apply any deodorants/lotions. Please wear clean clothes to the hospital/surgery center.  Please read over the following fact sheets that you were given. Coughing and Deep Breathing, MRSA Information and Surgical Site Infection Prevention

## 2016-09-22 ENCOUNTER — Encounter (HOSPITAL_COMMUNITY)
Admission: RE | Admit: 2016-09-22 | Discharge: 2016-09-22 | Disposition: A | Discharge status: Home / Self Care | Payer: PPO | Source: Ambulatory Visit | Attending: Orthopedic Surgery | Admitting: Orthopedic Surgery

## 2016-09-22 ENCOUNTER — Encounter (HOSPITAL_COMMUNITY): Payer: Self-pay

## 2016-09-22 DIAGNOSIS — M1612 Unilateral primary osteoarthritis, left hip: Secondary | ICD-10-CM | POA: Insufficient documentation

## 2016-09-22 DIAGNOSIS — Z01818 Encounter for other preprocedural examination: Secondary | ICD-10-CM | POA: Insufficient documentation

## 2016-09-22 HISTORY — DX: Unspecified osteoarthritis, unspecified site: M19.90

## 2016-09-22 HISTORY — DX: Anxiety disorder, unspecified: F41.9

## 2016-09-22 HISTORY — DX: Other seasonal allergic rhinitis: J30.2

## 2016-09-22 HISTORY — DX: Gastritis, unspecified, without bleeding: K29.70

## 2016-09-22 LAB — CBC WITH DIFFERENTIAL/PLATELET
Basophils Absolute: 0 10*3/uL (ref 0.0–0.1)
Basophils Relative: 0 %
EOS PCT: 3 %
Eosinophils Absolute: 0.1 10*3/uL (ref 0.0–0.7)
HEMATOCRIT: 41.1 % (ref 36.0–46.0)
HEMOGLOBIN: 13.5 g/dL (ref 12.0–15.0)
LYMPHS ABS: 1.2 10*3/uL (ref 0.7–4.0)
LYMPHS PCT: 33 %
MCH: 32.8 pg (ref 26.0–34.0)
MCHC: 32.8 g/dL (ref 30.0–36.0)
MCV: 99.8 fL (ref 78.0–100.0)
Monocytes Absolute: 0.3 10*3/uL (ref 0.1–1.0)
Monocytes Relative: 10 %
Neutro Abs: 1.9 10*3/uL (ref 1.7–7.7)
Neutrophils Relative %: 54 %
PLATELETS: 198 10*3/uL (ref 150–400)
RBC: 4.12 MIL/uL (ref 3.87–5.11)
RDW: 11.8 % (ref 11.5–15.5)
WBC: 3.6 10*3/uL — AB (ref 4.0–10.5)

## 2016-09-22 LAB — ABO/RH: ABO/RH(D): O POS

## 2016-09-22 LAB — URINALYSIS, ROUTINE W REFLEX MICROSCOPIC
Bilirubin Urine: NEGATIVE
GLUCOSE, UA: NEGATIVE mg/dL
Hgb urine dipstick: NEGATIVE
Ketones, ur: NEGATIVE mg/dL
LEUKOCYTES UA: NEGATIVE
NITRITE: NEGATIVE
PH: 6 (ref 5.0–8.0)
Protein, ur: NEGATIVE mg/dL
Specific Gravity, Urine: 1.014 (ref 1.005–1.030)

## 2016-09-22 LAB — SURGICAL PCR SCREEN
MRSA, PCR: NEGATIVE
Staphylococcus aureus: NEGATIVE

## 2016-09-22 LAB — COMPREHENSIVE METABOLIC PANEL
ALK PHOS: 69 U/L (ref 38–126)
ALT: 14 U/L (ref 14–54)
AST: 24 U/L (ref 15–41)
Albumin: 4 g/dL (ref 3.5–5.0)
Anion gap: 7 (ref 5–15)
BUN: 19 mg/dL (ref 6–20)
CALCIUM: 9 mg/dL (ref 8.9–10.3)
CO2: 27 mmol/L (ref 22–32)
CREATININE: 0.74 mg/dL (ref 0.44–1.00)
Chloride: 107 mmol/L (ref 101–111)
Glucose, Bld: 84 mg/dL (ref 65–99)
Potassium: 3.9 mmol/L (ref 3.5–5.1)
Sodium: 141 mmol/L (ref 135–145)
TOTAL PROTEIN: 5.9 g/dL — AB (ref 6.5–8.1)
Total Bilirubin: 0.8 mg/dL (ref 0.3–1.2)

## 2016-09-22 LAB — PROTIME-INR
INR: 0.96
Prothrombin Time: 12.8 seconds (ref 11.4–15.2)

## 2016-09-22 LAB — TYPE AND SCREEN
ABO/RH(D): O POS
Antibody Screen: NEGATIVE

## 2016-09-22 LAB — APTT: aPTT: 28 seconds (ref 24–36)

## 2016-09-24 ENCOUNTER — Ambulatory Visit: Payer: PPO | Admitting: Physical Therapy

## 2016-09-24 DIAGNOSIS — M62830 Muscle spasm of back: Secondary | ICD-10-CM

## 2016-09-24 DIAGNOSIS — M6281 Muscle weakness (generalized): Secondary | ICD-10-CM

## 2016-09-24 DIAGNOSIS — M5441 Lumbago with sciatica, right side: Secondary | ICD-10-CM | POA: Diagnosis not present

## 2016-09-24 DIAGNOSIS — G8929 Other chronic pain: Secondary | ICD-10-CM

## 2016-09-24 NOTE — Therapy (Signed)
[place], [place], [address] Phone: [phone]   Fax:  [phone]  Physical Therapy Treatment, Discharge   Patient Details  Name: LIBORIA PUTNAM MRN: [id number] Date of Birth: 1941-06-02 Referring Provider: Dr Zenovia Jarred   Encounter Date: 09/24/2016      PT End of Session - 09/24/16 0849    Visit Number 6   Number of Visits 16   Date for PT Re-Evaluation 10/16/16   Authorization Type healthstream medicare    PT Start Time 0800   PT Stop Time 0845   PT Time Calculation (min) 45 min   Activity Tolerance Patient tolerated treatment well   Behavior During Therapy [facility] for tasks assessed/performed      Past Medical History:  Diagnosis Date  . Anxiety   . Arthritis   . Gastritis   . Seasonal allergies     Past Surgical History:  Procedure Laterality Date  . DILATION AND CURETTAGE OF UTERUS      There were no vitals filed for this visit.      Subjective Assessment - 09/24/16 0804    Subjective Pain is about a 2/10.  This is my last visit before my surgery Monday.              [facility] PT Assessment - 09/24/16 8099      Strength   Right Hip Flexion 4+/5   Right Hip Extension 3+/5   Right Hip ABduction 4+/5   Left Hip Flexion 4+/5   Left Hip Extension 3+/5   Left Hip ABduction 4/5             [facility] Adult PT Treatment/Exercise - 09/24/16 0811      Lumbar Exercises: Aerobic   Elliptical 5 min level 1, ramp 5      Lumbar Exercises: Supine   Ab Set 10 reps   AB Set Limitations PPT x 10    Clam 10 reps   Clam Limitations red band , 1 set with bilat, 1 set with unilateral    Bridge 15 reps     Lumbar Exercises: Sidelying   Clam 20 reps   Clam Limitations red band bilateral    Hip Abduction 10 reps   Hip Abduction Weights (lbs) very challenging      Lumbar Exercises: Quadruped   [place] 10 reps   Other Quadruped Lumbar Exercises childs pose forward and  lateral to each side , 15-20 sec                 PT Education - 09/24/16 0830    Education provided Yes   Education Details final HEP and DC, cont light core work when recovering from hip surgery.    Person(s) Educated Patient   Methods Explanation;Demonstration;Tactile cues;Verbal cues   Comprehension Verbalized understanding;Returned demonstration          PT Short Term Goals - 09/24/16 0819      PT SHORT TERM GOAL #1   Title Patient will report < 2/10 pain in the lumbar spine    Baseline is usually stiff, gets better as she moves a bit more   Status Partially Met     PT SHORT TERM GOAL #2   Title Patient will be independent with initial HEP for core stability    Status Achieved     PT SHORT TERM GOAL #3   Title Patient will demonstrate 5/5 right LE and 4+/5 L LE strength gross    Baseline  L hip Abd 4/5, R hip abd 4+/5 , hip ext 3+/5            PT Long Term Goals - 10/03/16 0816      PT LONG TERM GOAL #1   Title Patient will demonstrate proper technique with home program for back and hip strengthening    Status Achieved     PT LONG TERM GOAL #2   Title Patient will ambulate 2 miles without increased pain in order to perfrom IADL's and walk for exercises    Baseline 5 laps at Y and also pool sessions indepedently 3 times per week    Status Partially Met     PT LONG TERM GOAL #3   Title Patient will stand for 1 hour without increased pain in order to perform IADL's    Baseline 10 min and then back knee and hip pain    Status Not Met               Plan - 10/03/16 0809    Clinical Impression Statement Pt is ready for discharge as she prepares for her L THR.  She cont to have hip weakness and pain in low back which interferes with long periods of standing and overactivity.  She has good flexibility and core strength is good as well. She hopes to return to PT after her surgery and HHPT.     PT Next Visit Plan NA, DC    PT Home Exercise Plan heel  slide with TA, pre pilates , bridge,  pelvic tilt, Hip abdcution with band; Hip ER stretch, single knee to chest stretch, piriformis stretch.    Consulted and Agree with Plan of Care Patient      Patient will benefit from skilled therapeutic intervention in order to improve the following deficits and impairments:  Abnormal gait, Decreased mobility, Increased muscle spasms, Decreased strength, Difficulty walking, Increased fascial restricitons, Decreased knowledge of use of DME, Decreased activity tolerance, Decreased range of motion, Impaired flexibility, Pain  Visit Diagnosis: Chronic right-sided low back pain with right-sided sciatica  Muscle spasm of back  Muscle weakness (generalized)       G-Codes - 10-03-16 0829    Functional Limitation Changing and maintaining body position   Changing and Maintaining Body Position Current Status (Y9244) At least 20 percent but less than 40 percent impaired, limited or restricted   Changing and Maintaining Body Position Goal Status (Q2863) At least 20 percent but less than 40 percent impaired, limited or restricted   Changing and Maintaining Body Position Discharge Status (O1771) At least 20 percent but less than 40 percent impaired, limited or restricted      Problem List Patient Active Problem List   Diagnosis Date Noted  . Urinary incontinence 06/25/2016  . Lumbar radiculopathy 06/18/2016  . 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 Abnormality of gait 02/02/2013  . Nonallopathic lesion of lumbar region 04/14/2011  . OSTEOARTHRITIS, HIP, LEFT 11/14/2009  . METATARSALGIA 11/14/2009  . BUNION, RIGHT FOOT 11/14/2009  . HIP PAIN, LEFT 10/02/2009  . Pain in joint, lower leg 10/02/2009  . LOW BACK PAIN, CHRONIC 10/02/2009    Othell Diluzio 10/03/16, 8:52 AM  [facility] [address] [place], [place], [address] Phone: [phone]   Fax:  [phone]  Name: MARELYN ROUSER MRN:  [id number] Date of Birth: 01/27/1941   PHYSICAL THERAPY DISCHARGE SUMMARY  Visits from Start of Care: 6  Current functional level related to goals / functional outcomes: See  above   Remaining deficits: Weakness in hip, decreased L hip ER due to OA    Education / Equipment: HEP, posture, Pilates Plan: Patient agrees to discharge.  Patient goals were partially met. Patient is being discharged due to a change in medical status.  ?????     Having L THA on 09/29/16.     [name], PT 09/24/16 8:52 AM Phone: [phone] Fax: [phone]

## 2016-09-28 NOTE — H&P (Signed)
TOTAL HIP ADMISSION H&P  Patient is admitted for left total hip arthroplasty.  Subjective:  Chief Complaint: left hip pain  HPI: Shelia [name], 75 y.o. female, has a history of pain and functional disability in the left hip(s) due to arthritis and patient has failed non-surgical conservative treatments for greater than 12 weeks to include NSAID's and/or analgesics, corticosteriod injections and activity modification.  Onset of symptoms was gradual starting 2 years ago with gradually worsening course since that time.The patient noted no past surgery on the left hip(s).  Patient currently rates pain in the left hip at 9 out of 10 with activity. Patient has night pain, worsening of pain with activity and weight bearing, trendelenberg gait, pain that interfers with activities of daily living, crepitus and joint swelling. Patient has evidence of subchondral cysts, subchondral sclerosis, periarticular osteophytes and joint space narrowing by imaging studies. This condition presents safety issues increasing the risk of falls. This patient has had failure of all reasonable conservative care.  There is no current active infection.  Patient Active Problem List   Diagnosis Date Noted  . Urinary incontinence 06/25/2016  . Lumbar radiculopathy 06/18/2016  . 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 Abnormality of gait 02/02/2013  . Nonallopathic lesion of lumbar region 04/14/2011  . OSTEOARTHRITIS, HIP, LEFT 11/14/2009  . METATARSALGIA 11/14/2009  . BUNION, RIGHT FOOT 11/14/2009  . HIP PAIN, LEFT 10/02/2009  . Pain in joint, lower leg 10/02/2009  . LOW BACK PAIN, CHRONIC 10/02/2009   Past Medical History:  Diagnosis Date  . Anxiety   . Arthritis   . Gastritis   . Seasonal allergies     Past Surgical History:  Procedure Laterality Date  . DILATION AND CURETTAGE OF UTERUS      No prescriptions prior to admission.   Allergies  Allergen Reactions  . Nsaids Other (See Comments)    Caused  painful stomach problems  . Prednisone Other (See Comments)    Caused painful stomach problems    Social History  Substance Use Topics  . Smoking status: Former Smoker  . Smokeless tobacco: Never Used  . Alcohol use Yes     Comment: weekly    Family History  Problem Relation Age of Onset  . Hypertension Mother   . Cancer Father   . Diabetes Sister      ROS ROS: I have reviewed the patient's review of systems thoroughly and there are no positive responses as relates to the HPI. Objective:  Physical Exam  Vital signs in last 24 hours:   Well-developed well-nourished patient in no acute distress. Alert and oriented x3 HEENT:within normal limits Cardiac: Regular rate and rhythm Pulmonary: Lungs clear to auscultation Abdomen: Soft and nontender.  Normal active bowel sounds  Musculoskeletal: (l hip: limited rom painful rom ) Labs: Recent Results (from the past 2160 hour(s))  Surgical pcr screen     Status: None   Collection Time: 09/22/16  8:31 AM  Result Value Ref Range   MRSA, PCR NEGATIVE NEGATIVE   Staphylococcus aureus NEGATIVE NEGATIVE    Comment:        The Xpert SA Assay (FDA approved for NASAL specimens in patients over 21 years of age), is one component of a comprehensive surveillance program.  Test performance has been validated by [facility] for patients greater than or equal to 1 year old. It is not intended to diagnose infection nor to guide or monitor treatment.   Urinalysis, Routine w reflex microscopic       Status: None   Collection Time: 09/22/16  8:31 AM  Result Value Ref Range   Color, Urine YELLOW YELLOW   APPearance CLEAR CLEAR   Specific Gravity, Urine 1.014 1.005 - 1.030   pH 6.0 5.0 - 8.0   Glucose, UA NEGATIVE NEGATIVE mg/dL   Hgb urine dipstick NEGATIVE NEGATIVE   Bilirubin Urine NEGATIVE NEGATIVE   Ketones, ur NEGATIVE NEGATIVE mg/dL   Protein, ur NEGATIVE NEGATIVE mg/dL   Nitrite NEGATIVE NEGATIVE   Leukocytes, UA NEGATIVE  NEGATIVE  APTT     Status: None   Collection Time: 09/22/16  8:32 AM  Result Value Ref Range   aPTT 28 24 - 36 seconds  CBC WITH DIFFERENTIAL     Status: Abnormal   Collection Time: 09/22/16  8:32 AM  Result Value Ref Range   WBC 3.6 (L) 4.0 - 10.5 K/uL   RBC 4.12 3.87 - 5.11 MIL/uL   Hemoglobin 13.5 12.0 - 15.0 g/dL   HCT 41.1 36.0 - 46.0 %   MCV 99.8 78.0 - 100.0 fL   MCH 32.8 26.0 - 34.0 pg   MCHC 32.8 30.0 - 36.0 g/dL   RDW 11.8 11.5 - 15.5 %   Platelets 198 150 - 400 K/uL   Neutrophils Relative % 54 %   Neutro Abs 1.9 1.7 - 7.7 K/uL   Lymphocytes Relative 33 %   Lymphs Abs 1.2 0.7 - 4.0 K/uL   Monocytes Relative 10 %   Monocytes Absolute 0.3 0.1 - 1.0 K/uL   Eosinophils Relative 3 %   Eosinophils Absolute 0.1 0.0 - 0.7 K/uL   Basophils Relative 0 %   Basophils Absolute 0.0 0.0 - 0.1 K/uL  Comprehensive metabolic panel     Status: Abnormal   Collection Time: 09/22/16  8:32 AM  Result Value Ref Range   Sodium 141 135 - 145 mmol/L   Potassium 3.9 3.5 - 5.1 mmol/L   Chloride 107 101 - 111 mmol/L   CO2 27 22 - 32 mmol/L   Glucose, Bld 84 65 - 99 mg/dL   BUN 19 6 - 20 mg/dL   Creatinine, Ser 0.74 0.44 - 1.00 mg/dL   Calcium 9.0 8.9 - 10.3 mg/dL   Total Protein 5.9 (L) 6.5 - 8.1 g/dL   Albumin 4.0 3.5 - 5.0 g/dL   AST 24 15 - 41 U/L   ALT 14 14 - 54 U/L   Alkaline Phosphatase 69 38 - 126 U/L   Total Bilirubin 0.8 0.3 - 1.2 mg/dL   GFR calc non Af Amer >60 >60 mL/min   GFR calc Af Amer >60 >60 mL/min    Comment: (NOTE) The eGFR has been calculated using the CKD EPI equation. This calculation has not been validated in all clinical situations. eGFR's persistently <60 mL/min signify possible Chronic Kidney Disease.    Anion gap 7 5 - 15  Protime-INR     Status: None   Collection Time: 09/22/16  8:32 AM  Result Value Ref Range   Prothrombin Time 12.8 11.4 - 15.2 seconds   INR 0.96   Type and screen Order type and screen if day of surgery is less than 15 days from  draw of preadmission visit or order morning of surgery if day of surgery is greater than 6 days from preadmission visit.     Status: None   Collection Time: 09/22/16  8:50 AM  Result Value Ref Range   ABO/RH(D) O POS    Antibody Screen NEG      Sample Expiration 10/06/2016    Extend sample reason NO TRANSFUSIONS OR PREGNANCY IN THE PAST 3 MONTHS   ABO/Rh     Status: None   Collection Time: 09/22/16  8:50 AM  Result Value Ref Range   ABO/RH(D) O POS      Estimated body mass index is 20.15 kg/m as calculated from the following:   Height as of 09/22/16: 5' 7.5" (1.715 m).   Weight as of 09/22/16: 59.2 kg (130 lb 9.6 oz).   Imaging Review Plain radiographs demonstrate severe degenerative joint disease of the left hip(s). The bone quality appears to be fair for age and reported activity level.  Assessment/Plan:  End stage arthritis, left hip(s)  The patient history, physical examination, clinical judgement of the provider and imaging studies are consistent with end stage degenerative joint disease of the left hip(s) and total hip arthroplasty is deemed medically necessary. The treatment options including medical management, injection therapy, arthroscopy and arthroplasty were discussed at length. The risks and benefits of total hip arthroplasty were presented and reviewed. The risks due to aseptic loosening, infection, stiffness, dislocation/subluxation,  thromboembolic complications and other imponderables were discussed.  The patient acknowledged the explanation, agreed to proceed with the plan and consent was signed. Patient is being admitted for inpatient treatment for surgery, pain control, PT, OT, prophylactic antibiotics, VTE prophylaxis, progressive ambulation and ADL's and discharge planning.The patient is planning to be discharged home with home health services 

## 2016-09-29 ENCOUNTER — Inpatient Hospital Stay (HOSPITAL_COMMUNITY): Payer: PPO

## 2016-09-29 ENCOUNTER — Encounter (HOSPITAL_COMMUNITY)
Admission: RE | Disposition: A | Discharge status: Home Health | Payer: Self-pay | Source: Ambulatory Visit | Attending: Orthopedic Surgery

## 2016-09-29 ENCOUNTER — Inpatient Hospital Stay (HOSPITAL_COMMUNITY)
Admission: RE | Admit: 2016-09-29 | Discharge: 2016-10-01 | DRG: 470 | Disposition: A | Discharge status: Home Health | Payer: PPO | Source: Ambulatory Visit | Attending: Orthopedic Surgery | Admitting: Orthopedic Surgery

## 2016-09-29 ENCOUNTER — Encounter (HOSPITAL_COMMUNITY): Payer: Self-pay | Admitting: *Deleted

## 2016-09-29 ENCOUNTER — Inpatient Hospital Stay (HOSPITAL_COMMUNITY): Payer: PPO | Admitting: Anesthesiology

## 2016-09-29 DIAGNOSIS — F419 Anxiety disorder, unspecified: Secondary | ICD-10-CM | POA: Diagnosis present

## 2016-09-29 DIAGNOSIS — M25552 Pain in left hip: Secondary | ICD-10-CM | POA: Diagnosis not present

## 2016-09-29 DIAGNOSIS — M21611 Bunion of right foot: Secondary | ICD-10-CM | POA: Diagnosis not present

## 2016-09-29 DIAGNOSIS — Z471 Aftercare following joint replacement surgery: Secondary | ICD-10-CM | POA: Diagnosis not present

## 2016-09-29 DIAGNOSIS — Z87891 Personal history of nicotine dependence: Secondary | ICD-10-CM | POA: Diagnosis not present

## 2016-09-29 DIAGNOSIS — Z96642 Presence of left artificial hip joint: Secondary | ICD-10-CM | POA: Diagnosis not present

## 2016-09-29 DIAGNOSIS — Z419 Encounter for procedure for purposes other than remedying health state, unspecified: Secondary | ICD-10-CM

## 2016-09-29 DIAGNOSIS — M7742 Metatarsalgia, left foot: Secondary | ICD-10-CM | POA: Diagnosis not present

## 2016-09-29 DIAGNOSIS — M1612 Unilateral primary osteoarthritis, left hip: Secondary | ICD-10-CM | POA: Diagnosis not present

## 2016-09-29 HISTORY — DX: Pure hypercholesterolemia, unspecified: E78.00

## 2016-09-29 HISTORY — DX: Other chronic pain: G89.29

## 2016-09-29 HISTORY — DX: Low back pain: M54.5

## 2016-09-29 HISTORY — DX: Low back pain, unspecified: M54.50

## 2016-09-29 HISTORY — DX: Palpitations: R00.2

## 2016-09-29 HISTORY — PX: TOTAL HIP ARTHROPLASTY: SHX124

## 2016-09-29 SURGERY — ARTHROPLASTY, HIP, TOTAL, ANTERIOR APPROACH
Anesthesia: Monitor Anesthesia Care | Laterality: Left

## 2016-09-29 MED ORDER — DOCUSATE SODIUM 100 MG PO CAPS: 100.0000 mg | ORAL_CAPSULE | Freq: Two times a day (BID) | ORAL | 0 refills | Status: DC

## 2016-09-29 MED ORDER — HYDROCODONE-ACETAMINOPHEN 5-325 MG PO TABS: 1.0000 | ORAL_TABLET | Freq: Four times a day (QID) | ORAL | 0 refills | Status: DC | PRN

## 2016-09-29 MED ORDER — LACTATED RINGERS IV SOLN
INTRAVENOUS | Status: DC
  Administered 2016-09-29 (×4): via INTRAVENOUS

## 2016-09-29 MED ORDER — BUPIVACAINE HCL (PF) 0.5 % IJ SOLN
INTRAMUSCULAR | Status: AC
  Filled 2016-09-29: qty 30

## 2016-09-29 MED ORDER — POLYETHYLENE GLYCOL 3350 17 G PO PACK: 17.0000 g | PACK | Freq: Every day | ORAL | Status: DC | PRN

## 2016-09-29 MED ORDER — HYDROCODONE-ACETAMINOPHEN 5-325 MG PO TABS
1.0000 | ORAL_TABLET | ORAL | Status: DC | PRN
  Administered 2016-09-29 – 2016-09-30 (×2): 1 via ORAL
  Filled 2016-09-29 (×2): qty 1
  Filled 2016-09-29: qty 2
  Filled 2016-09-29: qty 1

## 2016-09-29 MED ORDER — DIPHENHYDRAMINE HCL 12.5 MG/5ML PO ELIX: 12.5000 mg | ORAL_SOLUTION | ORAL | Status: DC | PRN

## 2016-09-29 MED ORDER — METHOCARBAMOL 500 MG PO TABS
500.0000 mg | ORAL_TABLET | Freq: Four times a day (QID) | ORAL | Status: DC | PRN
  Administered 2016-09-30 (×2): 500 mg via ORAL
  Filled 2016-09-29 (×2): qty 1

## 2016-09-29 MED ORDER — TIZANIDINE HCL 2 MG PO TABS: 2.0000 mg | ORAL_TABLET | Freq: Two times a day (BID) | ORAL | 0 refills | Status: DC | PRN

## 2016-09-29 MED ORDER — BUPIVACAINE HCL 0.5 % IJ SOLN
INTRAMUSCULAR | Status: DC | PRN
  Administered 2016-09-29: 20 mL

## 2016-09-29 MED ORDER — ASPIRIN EC 325 MG PO TBEC
325.0000 mg | DELAYED_RELEASE_TABLET | Freq: Two times a day (BID) | ORAL | Status: DC
  Administered 2016-09-30 – 2016-10-01 (×3): 325 mg via ORAL
  Filled 2016-09-29 (×3): qty 1

## 2016-09-29 MED ORDER — CEFAZOLIN SODIUM-DEXTROSE 2-4 GM/100ML-% IV SOLN
2.0000 g | Freq: Four times a day (QID) | INTRAVENOUS | Status: AC
  Administered 2016-09-29 – 2016-09-30 (×2): 2 g via INTRAVENOUS
  Filled 2016-09-29 (×2): qty 100

## 2016-09-29 MED ORDER — ASPIRIN EC 325 MG PO TBEC: 325.0000 mg | DELAYED_RELEASE_TABLET | Freq: Two times a day (BID) | ORAL | 0 refills | Status: DC

## 2016-09-29 MED ORDER — EPHEDRINE SULFATE 50 MG/ML IJ SOLN
INTRAMUSCULAR | Status: DC | PRN
  Administered 2016-09-29 (×2): 5 mg via INTRAVENOUS
  Administered 2016-09-29 (×2): 10 mg via INTRAVENOUS

## 2016-09-29 MED ORDER — MAGNESIUM CITRATE PO SOLN: 1.0000 | Freq: Once | ORAL | Status: DC | PRN

## 2016-09-29 MED ORDER — FENTANYL CITRATE (PF) 100 MCG/2ML IJ SOLN
INTRAMUSCULAR | Status: AC
  Filled 2016-09-29: qty 2

## 2016-09-29 MED ORDER — SODIUM CHLORIDE 0.9 % IV SOLN
INTRAVENOUS | Status: DC
  Administered 2016-09-29: 100 mL/h via INTRAVENOUS

## 2016-09-29 MED ORDER — TRANEXAMIC ACID 1000 MG/10ML IV SOLN
1000.0000 mg | INTRAVENOUS | Status: AC
  Administered 2016-09-29: 1000 mg via INTRAVENOUS
  Filled 2016-09-29: qty 10

## 2016-09-29 MED ORDER — CEFAZOLIN SODIUM-DEXTROSE 2-4 GM/100ML-% IV SOLN
2.0000 g | INTRAVENOUS | Status: AC
  Administered 2016-09-29: 2 g via INTRAVENOUS
  Filled 2016-09-29: qty 100

## 2016-09-29 MED ORDER — FENTANYL CITRATE (PF) 100 MCG/2ML IJ SOLN
INTRAMUSCULAR | Status: DC | PRN
  Administered 2016-09-29 (×4): 50 ug via INTRAVENOUS

## 2016-09-29 MED ORDER — BUPIVACAINE LIPOSOME 1.3 % IJ SUSP
20.0000 mL | Freq: Once | INTRAMUSCULAR | Status: DC
  Filled 2016-09-29 (×2): qty 20

## 2016-09-29 MED ORDER — METHOCARBAMOL 1000 MG/10ML IJ SOLN
500.0000 mg | Freq: Four times a day (QID) | INTRAMUSCULAR | Status: DC | PRN
  Filled 2016-09-29: qty 5

## 2016-09-29 MED ORDER — BUPIVACAINE LIPOSOME 1.3 % IJ SUSP
INTRAMUSCULAR | Status: DC | PRN
  Administered 2016-09-29: 20 mL

## 2016-09-29 MED ORDER — SODIUM CHLORIDE 0.9 % IV SOLN
1000.0000 mg | Freq: Once | INTRAVENOUS | Status: AC
  Administered 2016-09-29: 1000 mg via INTRAVENOUS
  Filled 2016-09-29: qty 10

## 2016-09-29 MED ORDER — PROPOFOL 500 MG/50ML IV EMUL
INTRAVENOUS | Status: DC | PRN
  Administered 2016-09-29: 50 ug/kg/min via INTRAVENOUS

## 2016-09-29 MED ORDER — PANTOPRAZOLE SODIUM 40 MG PO TBEC
80.0000 mg | DELAYED_RELEASE_TABLET | Freq: Every day | ORAL | Status: DC
  Administered 2016-09-29 – 2016-10-01 (×3): 80 mg via ORAL
  Filled 2016-09-29 (×3): qty 2

## 2016-09-29 MED ORDER — LIDOCAINE 2% (20 MG/ML) 5 ML SYRINGE
INTRAMUSCULAR | Status: AC
  Filled 2016-09-29: qty 5

## 2016-09-29 MED ORDER — ACETAMINOPHEN 10 MG/ML IV SOLN
INTRAVENOUS | Status: DC | PRN
  Administered 2016-09-29: 1000 mg via INTRAVENOUS

## 2016-09-29 MED ORDER — ONDANSETRON HCL 4 MG/2ML IJ SOLN: 4.0000 mg | Freq: Four times a day (QID) | INTRAMUSCULAR | Status: DC | PRN

## 2016-09-29 MED ORDER — ONDANSETRON HCL 4 MG/2ML IJ SOLN
INTRAMUSCULAR | Status: DC | PRN
  Administered 2016-09-29: 4 mg via INTRAVENOUS

## 2016-09-29 MED ORDER — BISACODYL 5 MG PO TBEC: 5.0000 mg | DELAYED_RELEASE_TABLET | Freq: Every day | ORAL | Status: DC | PRN

## 2016-09-29 MED ORDER — ALUM & MAG HYDROXIDE-SIMETH 200-200-20 MG/5ML PO SUSP: 30.0000 mL | ORAL | Status: DC | PRN

## 2016-09-29 MED ORDER — HYDROMORPHONE HCL 2 MG/ML IJ SOLN: 0.5000 mg | INTRAMUSCULAR | Status: DC | PRN

## 2016-09-29 MED ORDER — ACETAMINOPHEN 325 MG PO TABS
650.0000 mg | ORAL_TABLET | Freq: Four times a day (QID) | ORAL | Status: DC | PRN
  Administered 2016-09-29 – 2016-09-30 (×2): 650 mg via ORAL
  Filled 2016-09-29 (×3): qty 2

## 2016-09-29 MED ORDER — ZOLPIDEM TARTRATE 5 MG PO TABS: 5.0000 mg | ORAL_TABLET | Freq: Every evening | ORAL | Status: DC | PRN

## 2016-09-29 MED ORDER — CHLORHEXIDINE GLUCONATE 4 % EX LIQD: 60.0000 mL | Freq: Once | CUTANEOUS | Status: DC

## 2016-09-29 MED ORDER — GABAPENTIN 300 MG PO CAPS
300.0000 mg | ORAL_CAPSULE | Freq: Every day | ORAL | Status: DC
  Administered 2016-09-29 – 2016-09-30 (×2): 300 mg via ORAL
  Filled 2016-09-29 (×2): qty 1

## 2016-09-29 MED ORDER — DOCUSATE SODIUM 100 MG PO CAPS
100.0000 mg | ORAL_CAPSULE | Freq: Two times a day (BID) | ORAL | Status: DC
  Administered 2016-09-29 – 2016-10-01 (×4): 100 mg via ORAL
  Filled 2016-09-29 (×4): qty 1

## 2016-09-29 MED ORDER — ACETAMINOPHEN 650 MG RE SUPP: 650.0000 mg | Freq: Four times a day (QID) | RECTAL | Status: DC | PRN

## 2016-09-29 MED ORDER — ONDANSETRON HCL 4 MG PO TABS: 4.0000 mg | ORAL_TABLET | Freq: Four times a day (QID) | ORAL | Status: DC | PRN

## 2016-09-29 MED ORDER — 0.9 % SODIUM CHLORIDE (POUR BTL) OPTIME
TOPICAL | Status: DC | PRN
  Administered 2016-09-29: 1000 mL

## 2016-09-29 MED ORDER — ONDANSETRON HCL 4 MG/2ML IJ SOLN
INTRAMUSCULAR | Status: AC
  Filled 2016-09-29: qty 2

## 2016-09-29 MED ORDER — PROPOFOL 1000 MG/100ML IV EMUL
INTRAVENOUS | Status: AC
  Filled 2016-09-29: qty 100

## 2016-09-29 MED ORDER — LIDOCAINE 2% (20 MG/ML) 5 ML SYRINGE
INTRAMUSCULAR | Status: DC | PRN
  Administered 2016-09-29 (×2): 50 mg via INTRAVENOUS

## 2016-09-29 SURGICAL SUPPLY — 55 items
APL SKNCLS STERI-STRIP NONHPOA (GAUZE/BANDAGES/DRESSINGS) ×1
BENZOIN TINCTURE PRP APPL 2/3 (GAUZE/BANDAGES/DRESSINGS) ×2 IMPLANT
BLADE CLIPPER SURG (BLADE) IMPLANT
BNDG COHESIVE 6X5 TAN STRL LF (GAUZE/BANDAGES/DRESSINGS) IMPLANT
BNDG GAUZE ELAST 4 BULKY (GAUZE/BANDAGES/DRESSINGS) IMPLANT
CAPT HIP TOTAL 2 ×1 IMPLANT
CLSR STERI-STRIP ANTIMIC 1/2X4 (GAUZE/BANDAGES/DRESSINGS) IMPLANT
COVER PERINEAL POST (MISCELLANEOUS) ×2 IMPLANT
COVER SURGICAL LIGHT HANDLE (MISCELLANEOUS) ×2 IMPLANT
DRAPE C-ARM 42X72 X-RAY (DRAPES) ×2 IMPLANT
DRAPE STERI IOBAN 125X83 (DRAPES) ×2 IMPLANT
DRAPE U-SHAPE 47X51 STRL (DRAPES) ×4 IMPLANT
DRESSING AQUACEL AG SP 3.5X10 (GAUZE/BANDAGES/DRESSINGS) IMPLANT
DRSG AQUACEL AG ADV 3.5X10 (GAUZE/BANDAGES/DRESSINGS) ×2 IMPLANT
DRSG AQUACEL AG SP 3.5X10 (GAUZE/BANDAGES/DRESSINGS) ×2
DURAPREP 26ML APPLICATOR (WOUND CARE) ×2 IMPLANT
ELECT BLADE 4.0 EZ CLEAN MEGAD (MISCELLANEOUS)
ELECT CAUTERY BLADE 6.4 (BLADE) ×2 IMPLANT
ELECT REM PT RETURN 9FT ADLT (ELECTROSURGICAL) ×2
ELECTRODE BLDE 4.0 EZ CLN MEGD (MISCELLANEOUS) IMPLANT
ELECTRODE REM PT RTRN 9FT ADLT (ELECTROSURGICAL) ×1 IMPLANT
GLOVE BIOGEL PI IND STRL 8 (GLOVE) ×2 IMPLANT
GLOVE BIOGEL PI INDICATOR 8 (GLOVE) ×2
GLOVE ECLIPSE 7.5 STRL STRAW (GLOVE) ×4 IMPLANT
GOWN STRL REUS W/ TWL LRG LVL3 (GOWN DISPOSABLE) ×2 IMPLANT
GOWN STRL REUS W/ TWL XL LVL3 (GOWN DISPOSABLE) ×2 IMPLANT
GOWN STRL REUS W/TWL LRG LVL3 (GOWN DISPOSABLE) ×2
GOWN STRL REUS W/TWL XL LVL3 (GOWN DISPOSABLE) ×6
HOOD PEEL AWAY FACE SHEILD DIS (HOOD) ×4 IMPLANT
KIT BASIN OR (CUSTOM PROCEDURE TRAY) ×2 IMPLANT
KIT ROOM TURNOVER OR (KITS) ×2 IMPLANT
MANIFOLD NEPTUNE II (INSTRUMENTS) ×2 IMPLANT
NDL SPNL 22GX3.5 QUINCKE BK (NEEDLE) ×1 IMPLANT
NEEDLE SPNL 22GX3.5 QUINCKE BK (NEEDLE) ×2 IMPLANT
NS IRRIG 1000ML POUR BTL (IV SOLUTION) ×2 IMPLANT
PACK TOTAL JOINT (CUSTOM PROCEDURE TRAY) ×2 IMPLANT
PACK UNIVERSAL I (CUSTOM PROCEDURE TRAY) ×2 IMPLANT
PAD ARMBOARD 7.5X6 YLW CONV (MISCELLANEOUS) ×4 IMPLANT
RTRCTR WOUND ALEXIS 18CM SML (INSTRUMENTS) ×2
SAVER CELL AAL HAEMONETICS (INSTRUMENTS) ×1 IMPLANT
SAW OSC TIP CART 19.5X105X1.3 (SAW) ×2 IMPLANT
SPONGE LAP 18X18 X RAY DECT (DISPOSABLE) IMPLANT
SUT ETHIBOND NAB CT1 #1 30IN (SUTURE) ×4 IMPLANT
SUT MNCRL AB 3-0 PS2 18 (SUTURE) IMPLANT
SUT VIC AB 0 CT1 27 (SUTURE) ×2
SUT VIC AB 0 CT1 27XBRD ANBCTR (SUTURE) ×1 IMPLANT
SUT VIC AB 1 CT1 27 (SUTURE) ×4
SUT VIC AB 1 CT1 27XBRD ANBCTR (SUTURE) ×2 IMPLANT
SUT VIC AB 2-0 CT1 27 (SUTURE) ×2
SUT VIC AB 2-0 CT1 TAPERPNT 27 (SUTURE) ×1 IMPLANT
SYR 50ML LL SCALE MARK (SYRINGE) ×2 IMPLANT
TOWEL OR 17X24 6PK STRL BLUE (TOWEL DISPOSABLE) ×1 IMPLANT
TOWEL OR 17X26 10 PK STRL BLUE (TOWEL DISPOSABLE) ×2 IMPLANT
TRAY CATH 16FR W/PLASTIC CATH (SET/KITS/TRAYS/PACK) IMPLANT
TRAY FOLEY CATH 16FR SILVER (SET/KITS/TRAYS/PACK) IMPLANT

## 2016-09-29 NOTE — Discharge Instructions (Signed)

## 2016-09-29 NOTE — Brief Op Note (Signed)
09/29/2016  4:36 PM  PATIENT:  Shelia [name]  76 y.o. female  PRE-OPERATIVE DIAGNOSIS:  OSTEOARTHRITIS LEFT HIP  POST-OPERATIVE DIAGNOSIS:  OSTEOARTHRITIS LEFT HIP  PROCEDURE:  Procedure(s): TOTAL HIP ARTHROPLASTY ANTERIOR APPROACH (Left)  SURGEON:  Surgeon(s) and Role:    * Dorna Leitz, MD - Primary  PHYSICIAN ASSISTANT:   ASSISTANTS: bethune   ANESTHESIA:   spinal  EBL:  Total I/O In: 1000 [I.V.:1000] Out: 350 [Blood:350]  BLOOD ADMINISTERED:none  DRAINS: none   LOCAL MEDICATIONS USED:  MARCAINE    and OTHER experel  SPECIMEN:  No Specimen  DISPOSITION OF SPECIMEN:  N/A  COUNTS:  YES  TOURNIQUET:  * No tourniquets in log *  DICTATION: .Other Dictation: Dictation Number [id number]  PLAN OF CARE: Admit to inpatient   PATIENT DISPOSITION:  PACU - hemodynamically stable.   Delay start of Pharmacological VTE agent (>24hrs) due to surgical blood loss or risk of bleeding: no

## 2016-09-29 NOTE — Transfer of Care (Signed)
Immediate Anesthesia Transfer of Care Note  Patient: Shelia [name]  Procedure(s) Performed: Procedure(s): TOTAL HIP ARTHROPLASTY ANTERIOR APPROACH (Left)  Patient Location: PACU  Anesthesia Type:Spinal  Level of Consciousness: awake  Airway & Oxygen Therapy: Patient Spontanous Breathing and Patient connected to nasal cannula oxygen  Post-op Assessment: Report given to RN and Post -op Vital signs reviewed and stable  Post vital signs: Reviewed and stable  Last Vitals:  Vitals:   09/29/16 1221 09/29/16 1226  BP: (!) 146/75   Pulse: 62   Resp: 20   Temp:  36.5 C    Last Pain:  Vitals:   09/29/16 1226  TempSrc: Oral         Complications: No apparent anesthesia complications

## 2016-09-30 ENCOUNTER — Encounter (HOSPITAL_COMMUNITY): Payer: Self-pay | Admitting: General Practice

## 2016-09-30 LAB — CBC
HCT: 32.5 % — ABNORMAL LOW (ref 36.0–46.0)
Hemoglobin: 10.8 g/dL — ABNORMAL LOW (ref 12.0–15.0)
MCH: 32.7 pg (ref 26.0–34.0)
MCHC: 33.2 g/dL (ref 30.0–36.0)
MCV: 98.5 fL (ref 78.0–100.0)
PLATELETS: 139 10*3/uL — AB (ref 150–400)
RBC: 3.3 MIL/uL — ABNORMAL LOW (ref 3.87–5.11)
RDW: 11.5 % (ref 11.5–15.5)
WBC: 4.8 10*3/uL (ref 4.0–10.5)

## 2016-09-30 LAB — BASIC METABOLIC PANEL
Anion gap: 6 (ref 5–15)
BUN: 9 mg/dL (ref 6–20)
CO2: 27 mmol/L (ref 22–32)
CREATININE: 0.7 mg/dL (ref 0.44–1.00)
Calcium: 8.4 mg/dL — ABNORMAL LOW (ref 8.9–10.3)
Chloride: 106 mmol/L (ref 101–111)
GFR calc Af Amer: >60 mL/min (ref >60)
GLUCOSE: 110 mg/dL — AB (ref 65–99)
Potassium: 3.9 mmol/L (ref 3.5–5.1)
SODIUM: 139 mmol/L (ref 135–145)

## 2016-09-30 MED ORDER — TRAMADOL HCL 50 MG PO TABS
50.0000 mg | ORAL_TABLET | Freq: Four times a day (QID) | ORAL | Status: DC | PRN
  Administered 2016-09-30 – 2016-10-01 (×3): 50 mg via ORAL
  Filled 2016-09-30 (×3): qty 1

## 2016-09-30 MED ORDER — TRAMADOL HCL 50 MG PO TABS: 50.0000 mg | ORAL_TABLET | Freq: Four times a day (QID) | ORAL | 0 refills | Status: DC | PRN

## 2016-09-30 MED ORDER — BUPIVACAINE HCL (PF) 0.5 % IJ SOLN
INTRAMUSCULAR | Status: DC | PRN
  Administered 2016-09-29: 3 mL via INTRATHECAL

## 2016-09-30 NOTE — Evaluation (Signed)
Occupational Therapy Evaluation Patient Details Name: Shelia [name] MRN: [id number] DOB: [birth year] Today's Date: 09/30/2016    History of Present Illness 76 y.o. female admitted to [facility] on 09/30/15 for elective L direct anterior THA.  Pt with significant PMHx of chronic low back pain.    Clinical Impression   PTA, pt was independent with ADL and functional mobility. She currently requires min guard assist for LB ADL and toilet transfers. Educated pt on use of AE for LB ADL, safe use of 3-in-1 over commode and as a shower seat, safe use of RW, fall prevention, and energy conservation at home. She reports and demonstrates understanding of all topics. She would benefit from further OT services while admitted to improve independence with ADL and functional mobility. Recommend 24 hour assistance from husband post-acute D/C.    Follow Up Recommendations  No OT follow up;Supervision/Assistance - 24 hour    Equipment Recommendations  3 in 1 bedside commode    Recommendations for Other Services       Precautions / Restrictions Precautions Precautions: Fall Restrictions Weight Bearing Restrictions: Yes LLE Weight Bearing: Weight bearing as tolerated      Mobility Bed Mobility Overal bed mobility: Needs Assistance Bed Mobility: Sit to Supine     Supine to sit: Supervision Sit to supine: Min guard   General bed mobility comments: Increased time and using R LE to progress L LE into bed. Min guard assist for safety.  Transfers Overall transfer level: Needs assistance Equipment used: Rolling walker (2 wheeled) Transfers: Sit to/from Stand Sit to Stand: Min guard         General transfer comment: Pt hesitant about correct hand placement.     Balance Overall balance assessment: Needs assistance Sitting-balance support: Feet supported;No upper extremity supported Sitting balance-Leahy Scale: Good     Standing balance support: Bilateral upper extremity supported;No upper  extremity supported;Single extremity supported Standing balance-Leahy Scale: Fair                              ADL Overall ADL's : Needs assistance/impaired     Grooming: Set up;Sitting   Upper Body Bathing: Set up;Sitting   Lower Body Bathing: Min guard;With adaptive equipment;Sit to/from stand   Upper Body Dressing : Set up;Sitting   Lower Body Dressing: Min guard;With adaptive equipment;Sit to/from stand   Toilet Transfer: Min guard;Ambulation;RW   Toileting- Water quality scientist and Hygiene: Min guard;Sit to/from stand   Tub/ Shower Transfer: Minimal assistance;Ambulation;3 in 1;Rolling walker;Walk-in shower   Functional mobility during ADLs: Min guard;Rolling walker General ADL Comments: Educated pt on use of ice for pain management, fall prevention, energy conservation, sleeping positioning, safe shower transfers, safe use of DME, use of AE for LB ADL, and safe use of RW.     Vision Baseline Vision/History: No visual deficits Patient Visual Report: No change from baseline Vision Assessment?: No apparent visual deficits     Perception     Praxis      Pertinent Vitals/Pain Pain Assessment: 0-10 Pain Score: 6  Pain Location: left hip, thigh, and knee Pain Descriptors / Indicators: Aching Pain Intervention(s): Limited activity within patient's tolerance;Monitored during session;Repositioned;RN gave pain meds during session     Hand Dominance Right   Extremity/Trunk Assessment Upper Extremity Assessment Upper Extremity Assessment: Overall WFL for tasks assessed   Lower Extremity Assessment Lower Extremity Assessment: LLE deficits/detail LLE Deficits / Details: Decreased strength and ROM as expected post-operatively. LLE Sensation:  (  intact to LT)   Cervical / Trunk Assessment Cervical / Trunk Assessment: Other exceptions Cervical / Trunk Exceptions: pt with h/o low back issues   Communication Communication Communication: No difficulties    Cognition Arousal/Alertness: Awake/alert Behavior During Therapy: WFL for tasks assessed/performed Overall Cognitive Status: Within Functional Limits for tasks assessed                 General Comments: Pt requiring repetition of some education topics and reported feeling "a little off" likely due to medications and lack of sleep.   General Comments       Exercises Exercises: Total Joint     Shoulder Instructions      Home Living Family/patient expects to be discharged to:: Private residence Living Arrangements: Spouse/significant other Available Help at Discharge: Family;Available 24 hours/day Type of Home: House Home Access: Stairs to enter [employer] of Steps: 3, 1 Entrance Stairs-Rails: Right;Left Home Layout: Two level;Able to live on main level with bedroom/bathroom     Bathroom Shower/Tub: Occupational psychologist: Standard     Home Equipment: None          Prior Functioning/Environment Level of Independence: Independent        Comments: likes to hike, walk        OT Problem List: Decreased strength;Decreased range of motion;Decreased activity tolerance;Impaired balance (sitting and/or standing);Decreased safety awareness;Decreased knowledge of use of DME or AE;Decreased knowledge of precautions;Pain      OT Treatment/Interventions: Self-care/ADL training;Therapeutic exercise;Energy conservation;DME and/or AE instruction;Therapeutic activities;Patient/family education;Balance training    OT Goals(Current goals can be found in the care plan section) Acute Rehab OT Goals Patient Stated Goal: to get back to active lifestyle, hiking, water exercising.  OT Goal Formulation: With patient Time For Goal Achievement: 10/14/16 Potential to Achieve Goals: Good ADL Goals Pt Will Perform Lower Body Bathing: with modified independence;with adaptive equipment;sit to/from stand Pt Will Perform Lower Body Dressing: with modified  independence;with adaptive equipment;sit to/from stand Pt Will Transfer to Toilet: with modified independence;ambulating;bedside commode (BSC over toilet) Pt Will Perform Toileting - Clothing Manipulation and hygiene: with modified independence;sit to/from stand Pt Will Perform Tub/Shower Transfer: Shower transfer;with modified independence;rolling walker;ambulating;3 in 1  OT Frequency: Min 1X/week   Barriers to D/C:            Co-evaluation              End of Session Equipment Utilized During Treatment: Gait belt;Rolling walker Nurse Communication: Mobility status  Activity Tolerance: Patient tolerated treatment well Patient left: in bed;with call bell/phone within reach;with family/visitor present  OT Visit Diagnosis: Pain;Other abnormalities of gait and mobility (R26.89) Pain - Right/Left: Left Pain - part of body: Hip                ADL either performed or assessed with clinical judgement  Time: [id number] OT Time Calculation (min): 48 min Charges:  OT General Charges $OT Visit: 1 Procedure OT Evaluation $OT Eval Moderate Complexity: 1 Procedure OT Treatments $Self Care/Home Management : 23-37 mins G-Codes:     Norman Herrlich, MS OTR/L  Pager: [place] Winkowski 09/30/2016, 5:47 PM

## 2016-09-30 NOTE — Anesthesia Preprocedure Evaluation (Signed)
Anesthesia Evaluation  Patient identified by MRN, date of birth, ID band Patient awake    Reviewed: Allergy & Precautions, NPO status , Patient's Chart, lab work & pertinent test results  History of Anesthesia Complications Negative for: history of anesthetic complications  Airway Mallampati: I  TM Distance: >3 FB Neck ROM: Full    Dental  (+) Dental Advisory Given   Pulmonary former smoker,    breath sounds clear to auscultation       Cardiovascular negative cardio ROS   Rhythm:Regular     Neuro/Psych Anxiety  Neuromuscular disease    GI/Hepatic negative GI ROS, Neg liver ROS,   Endo/Other  negative endocrine ROS  Renal/GU negative Renal ROS     Musculoskeletal  (+) Arthritis ,   Abdominal   Peds  Hematology negative hematology ROS (+)   Anesthesia Other Findings   Reproductive/Obstetrics                             Anesthesia Physical Anesthesia Plan  ASA: II  Anesthesia Plan: MAC and Spinal   Post-op Pain Management:    Induction:   Airway Management Planned: Natural Airway, Nasal Cannula and Simple Face Mask  Additional Equipment:   Intra-op Plan:   Post-operative Plan:   Informed Consent: I have reviewed the patients History and Physical, chart, labs and discussed the procedure including the risks, benefits and alternatives for the proposed anesthesia with the patient or authorized representative who has indicated his/her understanding and acceptance.   Dental advisory given  Plan Discussed with: CRNA and Surgeon  Anesthesia Plan Comments:         Anesthesia Quick Evaluation

## 2016-09-30 NOTE — Op Note (Signed)
NAME:  TONIANNE, FRYREAR NO.:  [id number]  MEDICAL RECORD NO.:  [id number]  LOCATION:  [facility]                         FACILITY:  [facility]  PHYSICIAN:  Alta Corning, M.D.   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  PROCEDURE: 1. Left total hip replacement with a Corail stem size 12, 54 mm     pinnacle porous-coated cup, 36 mm +5 neck length head ball delta     ceramic. 2. Interpretation of multiple intraoperative fluoroscopic images.  SURGEON:  Alta Corning, M.D.  ASSIST:  Gary Fleet, P.A.  ANESTHESIA:  Spinal.  BRIEF HISTORY:  Ms. Trentadue is a 76 year old female with long history of significant complaints of left hip DJD.  She was beginning to have lateralization and severe pain in the hip.  She failed all conservative care, including activity modification and use of assistive devices. When she failed that, she was taken to the operating for left total hip replacement.  Preoperative imaging showed that she was little bit short on the operative side.  We had a long discussion of treatment options. We felt that she was a good candidate for anterior total hip replacement.  She was brought to the operating room for this procedure.  DESCRIPTION OF PROCEDURE:  The patient was brought to the operative room.  After adequate anesthesia was obtained with a spinal anesthetic, the patient was placed supine on the operating table.  Preoperative images were taken to ensure that we had some sense of her starting leg length.  At this point, she had been placed onto the Hana bed previously and all bony prominences were well padded at this time, and a left hip was then prepped  and draped in usual sterile fashion.  Following this, an incision was made for an anterior approach to the hip, subcutaneous tissue down to the level of the tensor fascia.  Tensor fascia was identified and divided and finger fractured around to the femur and retractors were put in place above and below the neck.  Traction was put in place and at this point, the capsule was open and tagged.  Following this, the anterior capsule was released and the provisional neck cut was then made and the head was removed, sized on the back to a 47.  We __________ 52 or 54 cup.  At this point, we put retractors in place and started reaming.  She did have significant and severe inferior osteophytes, which were removed several and then I got a little better sense of the geometry of the cup.  At this point, we reamed her up to a 53 mm and then put a 54 mm pinnacle cup in place.  Excellent stability was achieved, 30 degrees of anteversion, 45 degrees of lateral opening. Hole eliminator was used, followed by a +4 hip liner just to see if we could gain some length here.  Following this, attention was turned towards the stem side, we released the  posterior capsule, put the hip in an externally rotated down over position, and following this, we were able to sequentially rasp the femur up to a level of size 12 femur. This got excellent purchase and we then trialed this with a +0 ball. This appeared to be about a few millimeters short at this point with a +5 ball and this gave [place] a better look.  At this point, the final size 12 was opened, hammered into place.  A +5 delta ceramic hip ball was used 36 mm and the hip was reduced.  Final imaging was taken, excellent fit fill was achieved and the patient at this point, had the anterior capsule closed and the tensor fascia was closed with 0 Vicryl running. The tensor fascia was closed with 0 Vicryl running, skin with 0 and 2-0 Vicryl and 3-0 Monocryl subcuticular.   Benzoin and Steri-Strips were applied.  Sterile compressive dressing was applied.  The patient was taken to the recovery room.  She was noted to be in satisfactory condition.  Of note, multiple intraoperative fluoroscopic images were taken to assess leg length, fit and fill of the femur and acetabulum and alignment of the cup.  The estimated blood loss for procedure was about 300 mL.  Final can be gotten from her anesthetic record.  The patient was then at this point, taken to recovery room and she was noted to be in satisfactory condition.  COMPLICATIONS:  None.     Alta Corning, M.D.     Corliss Skains  D:  09/29/2016  T:  09/29/2016  Job:  [id number]

## 2016-09-30 NOTE — Evaluation (Signed)
Physical Therapy Evaluation Patient Details Name: Shelia [name] MRN: [id number] DOB: [birth year] Today's Date: 09/30/2016   History of Present Illness  76 y.o. female admitted to [facility] on 09/30/15 for elective L direct anterior THA.  Pt with significant PMHx of chronic low back pain.   Clinical Impression  Pt is POD #1 and is moving well, min assist overall for gait into the hallway. HEP program reviewed.  Pt will progress well enough to d/c home with her husband's assist.   PT to follow acutely for deficits listed below.       Follow Up Recommendations Home health PT;Supervision for mobility/OOB    Equipment Recommendations  Rolling walker with 5" wheels;3in1 (PT)    Recommendations for Other Services   NA     Precautions / Restrictions Precautions Precautions: Fall Restrictions LLE Weight Bearing: Weight bearing as tolerated      Mobility  Bed Mobility Overal bed mobility: Needs Assistance Bed Mobility: Supine to Sit     Supine to sit: Min assist     General bed mobility comments: min assist to help progress left leg out of bed.    Transfers Overall transfer level: Needs assistance Equipment used: Rolling walker (2 wheeled) Transfers: Sit to/from Stand Sit to Stand: Min assist         General transfer comment: Min assist to help support trunk for balance during transitions. Verbal cues for safe hand placement.   Ambulation/Gait Ambulation/Gait assistance: Min assist Ambulation Distance (Feet): 100 Feet Assistive device: Rolling walker (2 wheeled) Gait Pattern/deviations: Step-through pattern;Antalgic Gait velocity: decreased Gait velocity interpretation: Below normal speed for age/gender General Gait Details: Moderately antalgic gait pattern, verbal cues for upright posture, heel to toe pattern, lighter grip on RW handles         Balance Overall balance assessment: Needs assistance Sitting-balance support: Feet supported;No upper extremity  supported Sitting balance-Leahy Scale: Good     Standing balance support: Bilateral upper extremity supported;No upper extremity supported;Single extremity supported Standing balance-Leahy Scale: Fair                               Pertinent Vitals/Pain Pain Assessment: 0-10 Pain Score: 7  Pain Location: left hip, thigh, and knee Pain Descriptors / Indicators: Aching Pain Intervention(s): Limited activity within patient's tolerance;Monitored during session;Repositioned;Ice applied    Home Living Family/patient expects to be discharged to:: Private residence Living Arrangements: Spouse/significant other Available Help at Discharge: Family;Available 24 hours/day Type of Home: House Home Access: Stairs to enter Entrance Stairs-Rails: Right;Left Entrance Stairs-Number of Steps: 3, 1 Home Layout: Two level;Able to live on main level with bedroom/bathroom Home Equipment: None      Prior Function Level of Independence: Independent         Comments: likes to hike, walk        Extremity/Trunk Assessment   Upper Extremity Assessment Upper Extremity Assessment: Defer to OT evaluation    Lower Extremity Assessment Lower Extremity Assessment: LLE deficits/detail LLE Deficits / Details: left leg with normal post op pain and weakness.  Ankle at least 3/5, knee 3-/5, hip 2/5 LLE Sensation:  (intact to LT)    Cervical / Trunk Assessment Cervical / Trunk Assessment: Other exceptions Cervical / Trunk Exceptions: pt with h/o low back issues  Communication   Communication: No difficulties  Cognition Arousal/Alertness: Awake/alert Behavior During Therapy: WFL for tasks assessed/performed Overall Cognitive Status: Within Functional Limits for tasks assessed  Exercises Total Joint Exercises Ankle Circles/Pumps: AROM;Both;20 reps Quad Sets: AROM;Both;10 reps Heel Slides: AAROM;Left;10 reps Hip ABduction/ADduction: AAROM;Left;10 reps    Assessment/Plan    PT Assessment Patient needs continued PT services  PT Problem List Decreased strength;Decreased range of motion;Decreased activity tolerance;Decreased balance;Decreased mobility;Decreased knowledge of use of DME;Pain       PT Treatment Interventions DME instruction;Gait training;Stair training;Functional mobility training;Therapeutic activities;Therapeutic exercise;Balance training;Patient/family education;Manual techniques;Modalities    PT Goals (Current goals can be found in the Care Plan section)  Acute Rehab PT Goals Patient Stated Goal: to get back to active lifestyle, hiking, water exercising.  PT Goal Formulation: With patient Time For Goal Achievement: 10/07/16 Potential to Achieve Goals: Good    Frequency 7X/week           End of Session Equipment Utilized During Treatment: Gait belt Activity Tolerance: Patient limited by pain Patient left: in chair;with call bell/phone within reach;with family/visitor present   PT Visit Diagnosis: Muscle weakness (generalized) (M62.81);Difficulty in walking, not elsewhere classified (R26.2);Pain Pain - Right/Left: Left Pain - part of body: Hip         Time: [id number] PT Time Calculation (min) (ACUTE ONLY): 52 min   Charges:   PT Evaluation $PT Eval Low Complexity: 1 Procedure PT Treatments $Gait Training: 8-22 mins $Therapeutic Exercise: 8-22 mins $Therapeutic Activity: 8-22 mins          Britta Louth B. [place], [place], DPT [phone]   09/30/2016, 5:30 PM

## 2016-09-30 NOTE — Anesthesia Procedure Notes (Signed)
Spinal  Patient location during procedure: OR Start time: 09/29/2016 2:38 PM End time: 09/29/2016 2:46 PM Staffing Anesthesiologist: Oleta Mouse Preanesthetic Checklist Completed: patient identified, surgical consent, pre-op evaluation, timeout performed, IV checked, risks and benefits discussed and monitors and equipment checked Spinal Block Patient position: sitting Prep: site prepped and draped and DuraPrep Patient monitoring: heart rate, cardiac monitor, continuous pulse ox and blood pressure Approach: midline Location: L3-4 Injection technique: single-shot Needle Needle type: Pencan  Needle gauge: 24 G Needle length: 10 cm Assessment Sensory level: T4

## 2016-09-30 NOTE — Progress Notes (Signed)
Subjective: 1 Day Post-Op Procedure(s) (LRB): TOTAL HIP ARTHROPLASTY ANTERIOR APPROACH (Left) Patient reports pain as mild. Taking by mouth and voiding okay. Not out of bed yet.   Objective: Vital signs in last 24 hours: Temp:  [97.2 F (36.2 C)-97.8 F (36.6 C)] 97.6 F (36.4 C) (03/06 0445) Pulse Rate:  [50-62] 60 (03/06 0445) Resp:  [10-20] 19 (03/06 0445) BP: (97-146)/(54-92) 126/64 (03/06 0445) SpO2:  [99 %-100 %] 99 % (03/06 0445) Weight:  [59.2 kg (130 lb 9.6 oz)] 59.2 kg (130 lb 9.6 oz) (03/05 1221)  Intake/Output from previous day: 03/05 0701 - 03/06 0700 In: 2065 [I.V.:2065] Out: 550 [Urine:200; Blood:350] Intake/Output this shift: No intake/output data recorded.   Recent Labs  09/30/16 0636  HGB 10.8*    Recent Labs  09/30/16 0636  WBC 4.8  RBC 3.30*  HCT 32.5*  PLT 139*    Recent Labs  09/30/16 0636  NA 139  K 3.9  CL 106  CO2 27  BUN 9  CREATININE 0.70  GLUCOSE 110*  CALCIUM 8.4*   No results for input(s): LABPT, INR in the last 72 hours. Left hip exam: Neurovascular intact Sensation intact distally Intact pulses distally Dorsiflexion/Plantar flexion intact Incision: dressing C/D/I Compartment soft  Assessment/Plan: 1 Day Post-Op Procedure(s) (LRB): TOTAL HIP ARTHROPLASTY ANTERIOR APPROACH (Left) Plan: Up with physical therapy today. Aspirin 325 mg twice daily for DVT prophylaxis. Will order tramadol for mild pain. Plan for discharge tomorrow unless she does very well with physical therapy this afternoon then can be discharged home this afternoon.  Hanston G 09/30/2016, 9:44 AM

## 2016-09-30 NOTE — Anesthesia Postprocedure Evaluation (Addendum)
Anesthesia Post Note  Patient: Shelia [name]  Procedure(s) Performed: Procedure(s) (LRB): TOTAL HIP ARTHROPLASTY ANTERIOR APPROACH (Left)  Patient location during evaluation: PACU Anesthesia Type: MAC Level of consciousness: oriented and awake and alert Pain management: pain level controlled Vital Signs Assessment: post-procedure vital signs reviewed and stable Respiratory status: spontaneous breathing, respiratory function stable and patient connected to nasal cannula oxygen Cardiovascular status: blood pressure returned to baseline and stable Postop Assessment: spinal receding Anesthetic complications: no       Last Vitals:  Vitals:   09/30/16 0445 09/30/16 1400  BP: 126/64 (!) 101/49  Pulse: 60 60  Resp: 19 16  Temp: 36.4 C 37.4 C    Last Pain:  Vitals:   09/30/16 1727  TempSrc:   PainSc: 0-No pain                 Shelia [name]

## 2016-09-30 NOTE — Progress Notes (Signed)
09/30/16 1733  PT Visit Information  Last PT Received On 09/30/16  Assistance Needed +1  History of Present Illness 76 y.o. female admitted to [facility] on 09/30/15 for elective L direct anterior THA.  Pt with significant PMHx of chronic low back pain.   Subjective Data  Subjective Pt reports she is stiff  Patient Stated Goal to get back to active lifestyle, hiking, water exercising.   Precautions  Precautions Fall  Restrictions  LLE Weight Bearing WBAT  Pain Assessment  Pain Assessment 0-10  Pain Score 8  Pain Location left hip, thigh, and knee  Pain Descriptors / Indicators Aching  Pain Intervention(s) Limited activity within patient's tolerance;Monitored during session;Repositioned;Patient requesting pain meds-RN notified;Ice applied  Cognition  Arousal/Alertness Awake/alert  Behavior During Therapy WFL for tasks assessed/performed  Overall Cognitive Status Within Functional Limits for tasks assessed  Bed Mobility  Overal bed mobility Needs Assistance  Bed Mobility Supine to Sit  Supine to sit Supervision  General bed mobility comments Pt using both hands to progress her left leg over EOB.   Transfers  Overall transfer level Needs assistance  Equipment used Rolling walker (2 wheeled)  Transfers Sit to/from Stand  Sit to Stand Min guard  General transfer comment Min guard assist for safety and balance, verbal cues to reinforce safe hand palcement and slow descent to sit.   Ambulation/Gait  Ambulation/Gait assistance Min assist  Ambulation Distance (Feet) 120 Feet  Assistive device Rolling walker (2 wheeled)  Gait Pattern/deviations Step-through pattern;Antalgic  General Gait Details Moderately antalgic gait pattern, verbal cues for upright posture, heel to toe pattern, lighter grip on RW handles  Gait velocity decreased  Gait velocity interpretation Below normal speed for age/gender  Stairs Yes  Stairs assistance Min assist  Stair Management One rail Right;Step to  pattern;Forwards  Number of Stairs 4  General stair comments both hands on rails going up and down, min assist to support trunk, verbal cues for correct/safest LE sequencing.   Balance  Overall balance assessment Needs assistance  Sitting-balance support Feet supported;No upper extremity supported  Sitting balance-Leahy Scale Good  Standing balance support Bilateral upper extremity supported;No upper extremity supported;Single extremity supported  Standing balance-Leahy Scale Fair  Exercises  Exercises Total Joint  Total Joint Exercises  Ankle Circles/Pumps AROM;Both;20 reps  Quad Sets AROM;Both;20 reps  Hip ABduction/ADduction AAROM;Left;10 reps  Short Arc [place];Left;20 reps  Long Arc Quad AROM;Left;10 reps  PT - End of Session  Equipment Utilized During Treatment Gait belt  Activity Tolerance Patient limited by pain  Patient left in chair;with call bell/phone within reach  PT - Assessment/Plan  PT Plan Current plan remains appropriate  PT Visit Diagnosis Muscle weakness (generalized) (M62.81);Difficulty in walking, not elsewhere classified (R26.2);Pain  Pain - Right/Left Left  Pain - part of body Hip  PT Frequency (ACUTE ONLY) 7X/week  Follow Up Recommendations Home health PT;Supervision for mobility/OOB  PT equipment Rolling walker with 5" wheels;3in1 (PT)  AM-PAC PT "6 Clicks" Daily Activity Outcome Measure  Difficulty turning over in bed (including adjusting bedclothes, sheets and blankets)? 3  Difficulty moving from lying on back to sitting on the side of the bed?  4  Difficulty sitting down on and standing up from a chair with arms (e.g., wheelchair, bedside commode, etc,.)? 3  Help needed moving to and from a bed to chair (including a wheelchair)? 3  Help needed walking in hospital room? 3  Help needed climbing 3-5 steps with a railing?  2  6 Click Score  18  Mobility G Code  CK  PT Goal Progression  Progress towards PT goals Progressing toward goals  PT Time  Calculation  PT Start Time (ACUTE ONLY) 1530  PT Stop Time (ACUTE ONLY) 1603  PT Time Calculation (min) (ACUTE ONLY) 33 min  PT General Charges  $$ ACUTE PT VISIT 1 Procedure  PT Treatments  $Gait Training 8-22 mins  $Therapeutic Exercise 8-22 mins  09/30/2016 this is POD #1 and pt's second session.  She was able to do stairs with min assist and walk further down the hallway.  We continues to review her HEP.  She would like to stay another night to both decrease pain and reinforce education preformed today by therapists. Barbarann Ehlers [place], [place], DPT [phone]

## 2016-10-01 LAB — CBC
HCT: 33.3 % — ABNORMAL LOW (ref 36.0–46.0)
Hemoglobin: 10.9 g/dL — ABNORMAL LOW (ref 12.0–15.0)
MCH: 32.4 pg (ref 26.0–34.0)
MCHC: 32.7 g/dL (ref 30.0–36.0)
MCV: 99.1 fL (ref 78.0–100.0)
PLATELETS: 145 10*3/uL — AB (ref 150–400)
RBC: 3.36 MIL/uL — AB (ref 3.87–5.11)
RDW: 11.6 % (ref 11.5–15.5)
WBC: 7.3 10*3/uL (ref 4.0–10.5)

## 2016-10-01 NOTE — Discharge Summary (Signed)
Patient ID: Shelia [name] MRN: [id number] DOB/AGE: [birth year] 76 y.o.  Admit date: 09/29/2016 Discharge date: 10/01/2016  Admission Diagnoses:  Principal Problem:   Primary osteoarthritis of left hip   Discharge Diagnoses:  Same  Past Medical History:  Diagnosis Date  . Anxiety   . Arthritis    "left knee; left hip; lower back; mild to moderate in right hip" (09/30/2016)  . Chronic lower back pain   . Gastritis   . Heart palpitations   . Hypercholesteremia   . Seasonal allergies     Surgeries: Procedure(s):LeftL TOTAL HIP ARTHROPLASTY ANTERIOR APPROACH on 09/29/2016   Discharged Condition: Improved  Hospital Course: Shelia [name] is an 76 y.o. female who was admitted 09/29/2016 for operative treatment ofPrimary osteoarthritis of left hip. Patient has severe unremitting pain that affects sleep, daily activities, and work/hobbies. After pre-op clearance the patient was taken to the operating room on 09/29/2016 and underwent  Procedure(s):Left TOTAL HIP ARTHROPLASTY ANTERIOR APPROACH.    Patient was given perioperative antibiotics: Anti-infectives    Start     Dose/Rate Route Frequency Ordered Stop   09/30/16 0600  ceFAZolin (ANCEF) IVPB 2g/100 mL premix     2 g 200 mL/hr over 30 Minutes Intravenous On call to O.R. 09/29/16 1217 09/29/16 1445   09/29/16 2030  ceFAZolin (ANCEF) IVPB 2g/100 mL premix     2 g 200 mL/hr over 30 Minutes Intravenous Every 6 hours 09/29/16 1741 09/30/16 0206       Patient was given sequential compression devices, early ambulation, and chemoprophylaxis to prevent DVT.The patient progressed well with physical therapy. She was taking by mouth and voiding without difficulty.  Patient benefited maximally from hospital stay and there were no complications.    Recent vital signs: Patient Vitals for the past 24 hrs:  BP Temp Temp src Pulse Resp SpO2  10/01/16 0401 (!) 105/50 98.7 F (37.1 C) Oral 74 16 100 %  09/30/16 2100 118/61 99.4 F (37.4 C)  Oral 78 - 100 %     Recent laboratory studies:  Recent Labs  09/30/16 0636 10/01/16 0607  WBC 4.8 7.3  HGB 10.8* 10.9*  HCT 32.5* 33.3*  PLT 139* 145*  NA 139  --   K 3.9  --   CL 106  --   CO2 27  --   BUN 9  --   CREATININE 0.70  --   GLUCOSE 110*  --   CALCIUM 8.4*  --      Discharge Medications:   Allergies as of 10/01/2016      Reactions   Nsaids Other (See Comments)   Caused painful stomach problems   Prednisone Other (See Comments)   Caused painful stomach problems      Medication List    STOP taking these medications   Glucosamine-Chondroitin-MSM Tabs     TAKE these medications   aspirin EC 325 MG tablet Take 1 tablet (325 mg total) by mouth 2 (two) times daily after a meal. Take x 1 month post op to decrease risk of blood clots. Notes to patient:  Please take your next dose tonight before bed   cholecalciferol 1000 units tablet Commonly known as:  VITAMIN D Take 1,000 Units by mouth daily. Notes to patient:  Please take your next dose tomorrow morning   conjugated estrogens vaginal cream Commonly known as:  PREMARIN Place 1 Applicatorful vaginally 2 (two) times a week.   docusate sodium 100 MG capsule Commonly known as:  COLACE Take 1 capsule (  100 mg total) by mouth 2 (two) times daily. Notes to patient:  Please take your next dose tonight before bed   HYDROcodone-acetaminophen 5-325 MG tablet Commonly known as:  NORCO Take 1-2 tablets by mouth every 6 (six) hours as needed for moderate pain.   loratadine 10 MG tablet Commonly known as:  CLARITIN Take 10 mg by mouth daily as needed for allergies.   magnesium citrate Soln Take 1 Bottle by mouth once.   MELATONIN PO Take by mouth as needed.   omeprazole 40 MG capsule Commonly known as:  PRILOSEC Take 40 mg by mouth daily. Notes to patient:  Please take your next dose tomorrow morning   tiZANidine 2 MG tablet Commonly known as:  ZANAFLEX Take 1 tablet (2 mg total) by mouth every 12  (twelve) hours as needed for muscle spasms.   traMADol 50 MG tablet Commonly known as:  ULTRAM Take 1 tablet (50 mg total) by mouth every 6 (six) hours as needed for moderate pain.   TURMERIC CURCUMIN PO Take 1 capsule by mouth 2 (two) times daily. Curamin Extra Strength Notes to patient:  Please resume your home routine for taking this medication       Diagnostic Studies: Dg Chest 2 View  Result Date: 09/22/2016 CLINICAL DATA:  Preoperative evaluation for upcoming hip surgery EXAM: CHEST  2 VIEW COMPARISON:  None. FINDINGS: The heart size and mediastinal contours are within normal limits. Both lungs are clear. The visualized skeletal structures are unremarkable. IMPRESSION: No active cardiopulmonary disease. Electronically Signed   By: Inez Catalina M.D.   On: 09/22/2016 09:14   Dg Ugi W/high Density W/kub  Result Date: 09/15/2016 CLINICAL DATA:  Several months of dysphagia and left upper quadrant/ epigastric abdominal pain/burning/churning sensation. Patient is on PPI therapy. EXAM: UPPER GI SERIES WITH KUB TECHNIQUE: After obtaining a scout radiograph a routine upper GI series was performed using thin and high density barium. FLUOROSCOPY TIME:  Fluoroscopy Time:  3 minutes 6 seconds. Radiation Exposure Index (if provided by the fluoroscopic device): 111 mGy Number of Acquired Spot Images: 12 COMPARISON:  None. FINDINGS: Scout radiograph demonstrates no dilated small bowel loops. Mild-to-moderate colonic stool volume. No evidence of pneumatosis or pneumoperitoneum. No pathologic soft tissue calcifications. Mild to moderate thoracolumbar spondylosis. Clear lung bases. The oral and pharyngeal phases of swallowing are normal, with no laryngeal penetration or tracheobronchial aspiration. No significant barium retention in the pharynx. No mass, stricture or diverticulum in the pharynx. There is moderate cricopharyngeus muscle dysfunction, characterized by delayed and incomplete opening of the  cricopharyngeus muscle. No hiatal hernia. Mild gastroesophageal reflux was elicited with water siphon test to the level of the mid thoracic esophagus. Mild esophageal dysmotility, with proximal escape of the barium bolus in the upper thoracic esophagus and otherwise normal primary peristalsis. Slight granularity of the esophageal mucosa in the lower third of the thoracic esophagus, suggesting mild reflux esophagitis. No esophageal stricture, mass or ulcer. A swallowed 13 mm barium tablet traversed the esophagus into the stomach without delay. Normal gastric distensibility and motility. No gastric fold thickening. No filling defects or ulcers in the stomach. Normal duodenal bulb. Normal duodenal sweep, with no duodenal fold thickening, mass, stricture or ulcer. Normal duodenal jejunal junction. Visualized proximal small bowel loops in the left abdomen are normal caliber. IMPRESSION: 1. Normal oral and pharyngeal phase of swallowing, with no laryngeal penetration or tracheobronchial aspiration. 2. No hiatal hernia.  Mild gastroesophageal reflux. 3. Moderate cricopharyngeus muscle dysfunction, characteristic of chronic gastroesophageal  reflux disease. 4. Mild esophageal dysmotility, with a presbyesophagus pattern. 5. Mild reflux esophagitis in the lower third of the thoracic esophagus. No esophageal stricture, mass or ulcer . 6. Normal stomach and duodenum. Electronically Signed   By: Ilona Sorrel M.D.   On: 09/15/2016 09:49   Dg C-arm 1-60 Min  Result Date: 09/29/2016 CLINICAL DATA:  Total left hip replacement. FLUOROSCOPY TIME:  39 seconds. Images: 2 EXAM: DG C-ARM 61-120 MIN COMPARISON:  None. FINDINGS: Two images were obtained during a total left hip replacement. The acetabular and femoral components are in good position on the frontal views. IMPRESSION: Left hip replacement as above. Electronically Signed   By: Dorise Bullion III M.D   On: 09/29/2016 16:58   Dg Hip Operative Unilat W Or W/o Pelvis  Left  Result Date: 09/29/2016 CLINICAL DATA:  LEFT hip arthroplasty EXAM: OPERATIVE LEFT HIP (WITH PELVIS IF PERFORMED) 2 VIEWS TECHNIQUE: Fluoroscopic spot image(s) were submitted for interpretation post-operatively. COMPARISON:  None. FINDINGS: LEFT hip total arthroplasty.  No fracture or dislocation. IMPRESSION: No apparent complication following LEFT hip arthroplasty. Electronically Signed   By: Suzy Bouchard M.D.   On: 09/29/2016 17:12    Disposition: 01-Home or Self Care  Discharge Instructions    Call MD / Call 911    Complete by:  As directed    If you experience chest pain or shortness of breath, CALL 911 and be transported to the hospital emergency room.  If you develope a fever above 101 F, pus (white drainage) or increased drainage or redness at the wound, or calf pain, call your surgeon's office.   Constipation Prevention    Complete by:  As directed    Drink plenty of fluids.  Prune juice may be helpful.  You may use a stool softener, such as Colace (over the counter) 100 mg twice a day.  Use MiraLax (over the counter) for constipation as needed.   Diet general    Complete by:  As directed    Do not sit on low chairs, stoools or toilet seats, as it may be difficult to get up from low surfaces    Complete by:  As directed    Follow the hip precautions as taught in Physical Therapy    Complete by:  As directed    Increase activity slowly as tolerated    Complete by:  As directed    Weight bearing as tolerated    Complete by:  As directed    Laterality:  left   Extremity:  Lower   Weight bearing as tolerated    Complete by:  As directed    Laterality:  left   Extremity:  Lower      Follow-up Information    GRAVES,JOHN L, MD. Schedule an appointment as soon as possible for a visit on 10/16/2016.   Specialty:  Orthopedic Surgery Why:  Your appointment is at 10:00 AM. You will be seeing Dr Mayer Camel, as Dr Berenice Primas will be out of town. Contact information: [place] [id number] [place] Follow up.   Why:  A representative from [facility] will contact you to arrange start date and time for therapy. Contact information: [address] [place] [address] [phone]            Signed: Erlene Senters 10/01/2016, 5:15 PM

## 2016-10-01 NOTE — Progress Notes (Signed)
Patient verbalized understanding of discharge instructions and was able to teach back information to Rn. Patient provided with copy of discharge paperwork and paper prescriptions. Patient has 3-n-1 and front wheel walker at bedside to take home. Patient's spouse at bedside and will be driving the patient home. IV removed. Patient assisted in getting dressed for discharge.

## 2016-10-01 NOTE — Progress Notes (Signed)
Physical Therapy Treatment Patient Details Name: Shelia [name] MRN: [id number] DOB: 05/22/1941 Today's Date: 10/01/2016    History of Present Illness 76 y.o. female admitted to [facility] on 09/30/15 for elective L direct anterior THA.  Pt with significant PMHx of chronic low back pain.     PT Comments    Pt is POD #2 and she feels significantly better today compared to yesterday.  She says she slept well last night and her pain has decreased.  She is much more independent in her mobility and we were able to reinforce stair training.  She is already doing her HEP as instructed and is ready to d/c home later today with her husband.  PT to follow acutely until d/c confirmed.     Follow Up Recommendations  Home health PT;Supervision for mobility/OOB     Equipment Recommendations  Rolling walker with 5" wheels;3in1 (PT)    Recommendations for Other Services   NA     Precautions / Restrictions Precautions Precautions: None Restrictions LLE Weight Bearing: Weight bearing as tolerated    Mobility  Bed Mobility               General bed mobility comments: Pt already OOB in the recliner chair.   Transfers Overall transfer level: Needs assistance Equipment used: Rolling walker (2 wheeled) Transfers: Sit to/from Stand Sit to Stand: Modified independent (Device/Increase time)         General transfer comment: Pt was able to stand safely and easily from lower recliner chair.   Ambulation/Gait Ambulation/Gait assistance: Modified independent (Device/Increase time) Ambulation Distance (Feet): 150 Feet Assistive device: Rolling walker (2 wheeled) Gait Pattern/deviations: Step-through pattern;Antalgic     General Gait Details: very mildly antalgic gait pattern.  Verbal cues for upright posture.    Stairs Stairs: Yes   Stair Management: One rail Right;Step to pattern;Forwards Number of Stairs: 5 (x2) General stair comments: Supervision for safety and verbal cues to  reinforce correct LE sequencing.  Pt was able to demonstrate and report back teaching second time over the steps         Balance Overall balance assessment: Needs assistance Sitting-balance support: Feet supported;No upper extremity supported Sitting balance-Leahy Scale: Good     Standing balance support: No upper extremity supported Standing balance-Leahy Scale: Fair                      Cognition Arousal/Alertness: Awake/alert Behavior During Therapy: WFL for tasks assessed/performed Overall Cognitive Status: Within Functional Limits for tasks assessed                      Exercises Other Exercises Other Exercises: Pt reports she has already done her exercises this morning.  She even demonstrated a few of the standing exercises.      General Comments General comments (skin integrity, edema, etc.): Pt reports she feels like her right leg is shorter.  Pt's pelvis is elevated on the right, likely from some time of an abnormal gait pattern before surgery which makes her right leg seem shorter.  I advised her to work with her outpatient PT on this issue once cleared by MD.       Pertinent Vitals/Pain Pain Assessment: Faces Faces Pain Scale: Hurts little more Pain Location: left hip, left knee Pain Descriptors / Indicators: Grimacing;Guarding Pain Intervention(s): Limited activity within patient's tolerance;Monitored during session;Repositioned           PT Goals (current goals can now be found  in the care plan section) Acute Rehab PT Goals Patient Stated Goal: to get back to active lifestyle, hiking, water exercising.  Progress towards PT goals: Progressing toward goals    Frequency    7X/week      PT Plan Current plan remains appropriate       End of Session   Activity Tolerance: Patient limited by pain Patient left: in chair;with call bell/phone within reach   PT Visit Diagnosis: Muscle weakness (generalized) (M62.81);Difficulty in walking,  not elsewhere classified (R26.2);Pain Pain - Right/Left: Left Pain - part of body: Hip     Time: 8295-6213 PT Time Calculation (min) (ACUTE ONLY): 24 min  Charges:  $Gait Training: 23-37 mins             Faithe Ariola B. [place], [place], DPT [phone]   10/01/2016, 3:11 PM

## 2016-10-01 NOTE — Progress Notes (Signed)
Subjective: 2 Days Post-Op Procedure(s) (LRB): TOTAL HIP ARTHROPLASTY ANTERIOR APPROACH (Left) Patient reports pain as mild.  Taking by mouth and voiding okay. Good progress with physical therapy.  Objective: Vital signs in last 24 hours: Temp:  [98.7 F (37.1 C)-99.4 F (37.4 C)] 98.7 F (37.1 C) (03/07 0401) Pulse Rate:  [60-78] 74 (03/07 0401) Resp:  [16] 16 (03/07 0401) BP: (101-118)/(49-61) 105/50 (03/07 0401) SpO2:  [100 %] 100 % (03/07 0401)  Intake/Output from previous day: 03/06 0701 - 03/07 0700 In: 940 [P.O.:720; I.V.:220] Out: 250 [Urine:250] Intake/Output this shift: No intake/output data recorded.   Recent Labs  09/30/16 0636 10/01/16 0607  HGB 10.8* 10.9*    Recent Labs  09/30/16 0636 10/01/16 0607  WBC 4.8 7.3  RBC 3.30* 3.36*  HCT 32.5* 33.3*  PLT 139* 145*    Recent Labs  09/30/16 0636  NA 139  K 3.9  CL 106  CO2 27  BUN 9  CREATININE 0.70  GLUCOSE 110*  CALCIUM 8.4*   No results for input(s): LABPT, INR in the last 72 hours. Left hip exam: Neurovascular intact Sensation intact distally Intact pulses distally Dorsiflexion/Plantar flexion intact Incision: dressing C/D/I Compartment soft  Assessment/Plan: 2 Days Post-Op Procedure(s) (LRB): TOTAL HIP ARTHROPLASTY ANTERIOR APPROACH (Left) Plan: Weight-bear as tolerated on left without hip precautions. Aspirin 325 mg enteric-coated twice daily for DVT prophylaxis 1 month. Up with therapy Discharge home with home health Follow-up with Jim/Dr. Graves in 2 weeks.  [name] 10/01/2016, 8:14 AM

## 2016-10-01 NOTE — Care Management Note (Signed)
Case Management Note  Patient Details  Name: Shelia [name] MRN: [id number] Date of Birth: [birth year]  Subjective/Objective:   76 yr old female s/p left total hip arthroplasty.                 Action/Plan: Case manager spoke with patient concerning discharge plan and DME needs. Patient was preoperatively setup with [place], no changes. DME has been delivered to patient. She will have family support at discharge.    Expected Discharge Date:  10/01/16               Expected Discharge Plan:  [place]  In-House Referral:  NA  Discharge planning Services  CM Consult  Post Acute Care Choice:  Home Health, Durable Medical Equipment Choice offered to:  Patient  DME Arranged:  3-N-1, Walker rolling, CPM DME Agency:  [employer]  HH Arranged:  PT [place]:  [place]  Status of Service:  Completed, signed off  If discussed at [place] of Stay Meetings, dates discussed:    Additional Comments:  Ninfa Meeker, RN 10/01/2016, 8:49 AM

## 2016-10-02 DIAGNOSIS — M5416 Radiculopathy, lumbar region: Secondary | ICD-10-CM | POA: Diagnosis not present

## 2016-10-02 DIAGNOSIS — F419 Anxiety disorder, unspecified: Secondary | ICD-10-CM | POA: Diagnosis not present

## 2016-10-02 DIAGNOSIS — K2970 Gastritis, unspecified, without bleeding: Secondary | ICD-10-CM | POA: Diagnosis not present

## 2016-10-02 DIAGNOSIS — R32 Unspecified urinary incontinence: Secondary | ICD-10-CM | POA: Diagnosis not present

## 2016-10-02 DIAGNOSIS — Z7982 Long term (current) use of aspirin: Secondary | ICD-10-CM | POA: Diagnosis not present

## 2016-10-02 DIAGNOSIS — M150 Primary generalized (osteo)arthritis: Secondary | ICD-10-CM | POA: Diagnosis not present

## 2016-10-02 DIAGNOSIS — Z96642 Presence of left artificial hip joint: Secondary | ICD-10-CM | POA: Diagnosis not present

## 2016-10-02 DIAGNOSIS — Z471 Aftercare following joint replacement surgery: Secondary | ICD-10-CM | POA: Diagnosis not present

## 2016-10-02 DIAGNOSIS — Z87891 Personal history of nicotine dependence: Secondary | ICD-10-CM | POA: Diagnosis not present

## 2016-10-02 DIAGNOSIS — M545 Low back pain: Secondary | ICD-10-CM | POA: Diagnosis not present

## 2016-10-10 DIAGNOSIS — R32 Unspecified urinary incontinence: Secondary | ICD-10-CM | POA: Diagnosis not present

## 2016-10-10 DIAGNOSIS — K2970 Gastritis, unspecified, without bleeding: Secondary | ICD-10-CM | POA: Diagnosis not present

## 2016-10-10 DIAGNOSIS — Z87891 Personal history of nicotine dependence: Secondary | ICD-10-CM | POA: Diagnosis not present

## 2016-10-10 DIAGNOSIS — F419 Anxiety disorder, unspecified: Secondary | ICD-10-CM | POA: Diagnosis not present

## 2016-10-10 DIAGNOSIS — Z7982 Long term (current) use of aspirin: Secondary | ICD-10-CM | POA: Diagnosis not present

## 2016-10-10 DIAGNOSIS — Z471 Aftercare following joint replacement surgery: Secondary | ICD-10-CM | POA: Diagnosis not present

## 2016-10-10 DIAGNOSIS — M5416 Radiculopathy, lumbar region: Secondary | ICD-10-CM | POA: Diagnosis not present

## 2016-10-10 DIAGNOSIS — Z96642 Presence of left artificial hip joint: Secondary | ICD-10-CM | POA: Diagnosis not present

## 2016-10-10 DIAGNOSIS — M150 Primary generalized (osteo)arthritis: Secondary | ICD-10-CM | POA: Diagnosis not present

## 2016-10-10 DIAGNOSIS — M545 Low back pain: Secondary | ICD-10-CM | POA: Diagnosis not present

## 2016-10-14 DIAGNOSIS — M150 Primary generalized (osteo)arthritis: Secondary | ICD-10-CM | POA: Diagnosis not present

## 2016-10-14 DIAGNOSIS — Z96642 Presence of left artificial hip joint: Secondary | ICD-10-CM | POA: Diagnosis not present

## 2016-10-14 DIAGNOSIS — F419 Anxiety disorder, unspecified: Secondary | ICD-10-CM | POA: Diagnosis not present

## 2016-10-14 DIAGNOSIS — K2970 Gastritis, unspecified, without bleeding: Secondary | ICD-10-CM | POA: Diagnosis not present

## 2016-10-14 DIAGNOSIS — M545 Low back pain: Secondary | ICD-10-CM | POA: Diagnosis not present

## 2016-10-14 DIAGNOSIS — M5416 Radiculopathy, lumbar region: Secondary | ICD-10-CM | POA: Diagnosis not present

## 2016-10-14 DIAGNOSIS — Z471 Aftercare following joint replacement surgery: Secondary | ICD-10-CM | POA: Diagnosis not present

## 2016-10-14 DIAGNOSIS — Z7982 Long term (current) use of aspirin: Secondary | ICD-10-CM | POA: Diagnosis not present

## 2016-10-14 DIAGNOSIS — R32 Unspecified urinary incontinence: Secondary | ICD-10-CM | POA: Diagnosis not present

## 2016-10-14 DIAGNOSIS — Z87891 Personal history of nicotine dependence: Secondary | ICD-10-CM | POA: Diagnosis not present

## 2016-10-16 DIAGNOSIS — M1612 Unilateral primary osteoarthritis, left hip: Secondary | ICD-10-CM | POA: Diagnosis not present

## 2016-10-16 DIAGNOSIS — Z471 Aftercare following joint replacement surgery: Secondary | ICD-10-CM | POA: Diagnosis not present

## 2016-10-16 DIAGNOSIS — Z96642 Presence of left artificial hip joint: Secondary | ICD-10-CM | POA: Diagnosis not present

## 2016-11-05 ENCOUNTER — Ambulatory Visit: Payer: PPO | Attending: Orthopedic Surgery | Admitting: Physical Therapy

## 2016-11-05 ENCOUNTER — Encounter: Payer: Self-pay | Admitting: Physical Therapy

## 2016-11-05 DIAGNOSIS — M5441 Lumbago with sciatica, right side: Secondary | ICD-10-CM | POA: Insufficient documentation

## 2016-11-05 DIAGNOSIS — M25652 Stiffness of left hip, not elsewhere classified: Secondary | ICD-10-CM

## 2016-11-05 DIAGNOSIS — G8929 Other chronic pain: Secondary | ICD-10-CM

## 2016-11-05 DIAGNOSIS — M6281 Muscle weakness (generalized): Secondary | ICD-10-CM

## 2016-11-05 DIAGNOSIS — M62830 Muscle spasm of back: Secondary | ICD-10-CM | POA: Insufficient documentation

## 2016-11-05 NOTE — Patient Instructions (Signed)
Bracing With Bridging (Hook-Lying)    With neutral spine, tighten pelvic floor and abdominals and hold. Lift bottom. Repeat _10__ times. Do _2-3__ times a day. YOU ROLL UP AND DOWN YOUR SPINE IF THAT FEELS GOOD FOR YOUR SPINE.      Copyright  VHI. All rights reserved.    Abduction: Clam (Eccentric) - Side-Lying    Lie on side with knees bent. Lift top knee, keeping feet together. Keep trunk steady. Slowly lower for 3-5 seconds. _10-20__ reps per set, _1-2__ sets per day, __5-7_ days per week.   http://ecce.exer.us/65   Copyright  VHI. All rights reserved.

## 2016-11-05 NOTE — Therapy (Signed)
[place], [place], [address] Phone: [phone]   Fax:  [phone]  Physical Therapy Evaluation  Patient Details  Name: Shelia [name] MRN: [id number] Date of Birth: 1941-07-02 Referring Provider: Dr. Berenice Primas   Encounter Date: 11/05/2016      PT End of Session - 11/05/16 0843    Visit Number 1   Number of Visits 16   Date for PT Re-Evaluation 12/31/16   PT Start Time 0800   PT Stop Time 0845   PT Time Calculation (min) 45 min   Activity Tolerance Patient tolerated treatment well   Behavior During Therapy [facility] for tasks assessed/performed      Past Medical History:  Diagnosis Date  . Anxiety   . Arthritis    "left knee; left hip; lower back; mild to moderate in right hip" (09/30/2016)  . Chronic lower back pain   . Gastritis   . Heart palpitations   . Hypercholesteremia   . Seasonal allergies     Past Surgical History:  Procedure Laterality Date  . COLONOSCOPY    . DILATION AND CURETTAGE OF UTERUS    . JOINT REPLACEMENT    . TONSILLECTOMY    . 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   Procedure: TOTAL HIP ARTHROPLASTY ANTERIOR APPROACH;  Surgeon: Dorna Leitz, MD;  Location: [place];  Service: Orthopedics;  Laterality: Left;    There were no vitals filed for this visit.       Subjective Assessment - 11/05/16 0800    Subjective Patient presents as familiar to me from low back episode.  She underwent L THR on 09/29/16. She had HHPT for 4 visits.  She has some pain with sitting even for short periods, she continues with back pain. She can't walk > a couple of blocks or on uneven terrain.      Pertinent History Lumbar pain, history of GI problem   Limitations Sitting;Lifting;Standing;House hold activities   How long can you sit comfortably? < 20 min    How long can you stand comfortably? 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  prefers to walk    Patient Stated Goals To get back to normal activity get stronger    Currently in Pain? Yes   Pain Score 2    Pain Location Hip   Pain Orientation Left;Anterior;Lateral   Pain Descriptors / Indicators Discomfort;Aching   Pain Type Chronic pain   Pain Onset More than a month ago   Pain Frequency Intermittent   Aggravating Factors  sit and stand too long    Pain Relieving Factors walking, stretching    Pain Score 3   Pain Location Back   Pain Orientation Right;Left   Pain Descriptors / Indicators Aching;Discomfort;Dull   Pain Type Chronic pain   Pain Radiating Towards none now    Pain Onset More than a month ago   Pain Frequency Intermittent   Aggravating Factors  standing in one place    Pain Relieving Factors heat, stretching, Pilates    Effect of Pain on Daily Activities just has to deal with pain and self limit             [facility] PT Assessment - 11/05/16 0812      Assessment   Medical Diagnosis L THR    Referring Provider Dr. Berenice Primas    Onset Date/Surgical Date 09/29/16   Hand Dominance Right  Prior Therapy Yes for lumbar     Precautions   Precautions Anterior Hip     Restrictions   Weight Bearing Restrictions No     Balance Screen   Has the patient fallen in the past 6 months No   How many times? has general balance concerns      [place] residence   Living Arrangements Spouse/significant other   Additional Comments Steps to her basement which casue her some left hip pain      Prior Function   Level of Independence Independent   Vocation Retired   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    Leisure walking/ yoga     Cognition   Overall Cognitive Status Within Functional Limits for tasks assessed   Attention Focused   Focused Attention Appears intact   Memory Appears intact   Awareness Appears intact     Observation/Other Assessments   Focus on Therapeutic Outcomes (FOTO)  42%      Posture/Postural Control   Posture/Postural Control Postural limitations   Postural Limitations Rounded Shoulders;Forward head;Decreased lumbar lordosis;Weight shift right   Posture Comments L ASIS lower in supine and L  LE appears longer in supine      AROM   Right Hip External Rotation  40   Right Hip Internal Rotation  30   Left Hip Flexion 120   Left Hip External Rotation  30   Left Hip Internal Rotation  20   Left Hip ABduction 60   Lumbar Flexion WFL    Lumbar Extension felt good      PROM   Overall PROM Comments decr ER of L hip , no pain      Strength   Right Hip Flexion 4+/5   Right Hip External Rotation  5/5   Right Hip Internal Rotation 4+/5   Right Hip ABduction 4+/5   Left Hip Flexion 4-/5   Left Hip External Rotation 4/5   Left Hip Internal Rotation 3+/5   Left Hip ABduction 3+/5   Right/Left Knee --  [facility]      Ambulation/Gait   Gait Comments Pt walks short distances without significant deviation                   [facility] Adult PT Treatment/Exercise - 11/05/16 0812      Knee/Hip Exercises: Aerobic   Nustep L7 UE and LE for 5 min, changed to LE only for challenge      Knee/Hip Exercises: Supine   Bridges Strengthening;Both;1 set;15 reps                PT Education - 11/05/16 1313    Education provided Yes   Education Details PT/POC, HEP, leg length discrepancy   Person(s) Educated Patient   Methods Explanation   Comprehension Verbalized understanding;Returned demonstration          PT Short Term Goals - 11/05/16 1322      PT SHORT TERM GOAL #1   Title Patient will report < 2/10 pain in the L hip with most everyday activities.    Time 4   Period Weeks   Status New     PT SHORT TERM GOAL #2   Title Patient will be independent with initial HEP for core stability    Time 4   Period Weeks   Status New     PT SHORT TERM GOAL #3   Title Pt will be able to tolerate sitting for  up to 30 min with only min pain.     Baseline less than 15 min pain mod    Time 4   Period Weeks   Status New           PT Long Term Goals - Nov 28, 2016 1324      PT LONG TERM GOAL #1   Title Patient will demonstrate proper technique with home program for core and hip strengthening    Time 8   Period Weeks   Status New     PT LONG TERM GOAL #2   Title Patient will ambulate 2 miles without increased pain in order to perfrom IADL's and walk for exercises    Time 8   Period Weeks   Status New     PT LONG TERM GOAL #3   Title Patient will stand for 30-45 min with min increased pain in order to perform IADL's    Time 8   Period Weeks   Status New     PT LONG TERM GOAL #4   Title FOTO score will improve to <25% impaired to demo functional improvement.    Time 8   Period Weeks   Status New               Plan - Nov 28, 2016 1317    Clinical Impression Statement This patient presents for low complexity of L THR anterior approach with no complications.  She is doing well but has difficulty with sitting, standing, transitional movements and has been unable to resume her normal activities.  She has decr AROM in L hip , min weakness in core and hips and would like to improve her overall fitness level in a safe environment.    Rehab Potential Excellent   PT Frequency 2x / week   PT Duration 8 weeks  if needed    PT Treatment/Interventions ADLs/Self Care Home Management;Cryotherapy;Electrical Stimulation;Iontophoresis 4mg /ml Dexamethasone;Moist Heat;Ultrasound;Gait training;Therapeutic activities;Therapeutic exercise;Cognitive remediation;Patient/family education;Manual techniques;Dry needling;Splinting;Taping;Passive range of motion;Traction;Functional mobility training;Neuromuscular re-education;Balance training;Other (comment)  Pilates based PT    PT Next Visit Plan mat level HEP for hip and core strength, progress to standing, Reformer ex. Check and measure leg length discrepancy.    PT Home Exercise Plan bridge,  clam, HHPT HEP    Consulted and Agree with Plan of Care Patient      Patient will benefit from skilled therapeutic intervention in order to improve the following deficits and impairments:  Abnormal gait, Decreased mobility, Increased muscle spasms, Decreased strength, Difficulty walking, Increased fascial restricitons, Decreased knowledge of use of DME, Decreased activity tolerance, Decreased range of motion, Impaired flexibility, Pain  Visit Diagnosis: Muscle weakness (generalized)  Chronic right-sided low back pain with right-sided sciatica  Stiffness of left hip, not elsewhere classified      G-Codes - 28-Nov-2016 1325    Functional Limitation Changing and maintaining body position   Changing and Maintaining Body Position Current Status (D3220) At least 40 percent but less than 60 percent impaired, limited or restricted   Changing and Maintaining Body Position Goal Status (U5427) At least 20 percent but less than 40 percent impaired, limited or restricted       Problem List Patient Active Problem List   Diagnosis Date Noted  . Primary osteoarthritis of left hip 09/29/2016  . Urinary incontinence 06/25/2016  . Lumbar radiculopathy 06/18/2016  . 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 Abnormality of gait 02/02/2013  . Nonallopathic lesion of lumbar region 04/14/2011  . OSTEOARTHRITIS, HIP, LEFT 11/14/2009  .  METATARSALGIA 11/14/2009  . BUNION, RIGHT FOOT 11/14/2009  . HIP PAIN, LEFT 10/02/2009  . Pain in joint, lower leg 10/02/2009  . LOW BACK PAIN, CHRONIC 10/02/2009    Nikhita Mentzel 11/05/2016, 1:30 PM  [facility] [address] [place], [place], [address] Phone: [phone]   Fax:  [phone]  Name: Shelia [name] MRN: [id number] Date of Birth: 1940-08-12  [name], PT 11/05/16 1:31 PM Phone: [phone] Fax: [phone]

## 2016-11-11 ENCOUNTER — Ambulatory Visit: Payer: PPO | Admitting: Physical Therapy

## 2016-11-11 DIAGNOSIS — G8929 Other chronic pain: Secondary | ICD-10-CM

## 2016-11-11 DIAGNOSIS — M25652 Stiffness of left hip, not elsewhere classified: Secondary | ICD-10-CM

## 2016-11-11 DIAGNOSIS — M5441 Lumbago with sciatica, right side: Secondary | ICD-10-CM

## 2016-11-11 DIAGNOSIS — M6281 Muscle weakness (generalized): Secondary | ICD-10-CM

## 2016-11-11 NOTE — Therapy (Signed)
[place], [place], [address] Phone: [phone]   Fax:  [phone]  Physical Therapy Treatment  Patient Details  Name: Shelia [name] MRN: [id number] Date of Birth: 1940/10/18 Referring Provider: Dr. Berenice Primas   Encounter Date: 11/11/2016      PT End of Session - 11/11/16 1159    Visit Number 2   Number of Visits 16   Date for PT Re-Evaluation 12/31/16   Authorization Type healthstream medicare    PT Start Time 0804   PT Stop Time 0850   PT Time Calculation (min) 46 min   Activity Tolerance Patient tolerated treatment well   Behavior During Therapy [facility] for tasks assessed/performed      Past Medical History:  Diagnosis Date  . Anxiety   . Arthritis    "left knee; left hip; lower back; mild to moderate in right hip" (09/30/2016)  . Chronic lower back pain   . Gastritis   . Heart palpitations   . Hypercholesteremia   . Seasonal allergies     Past Surgical History:  Procedure Laterality Date  . COLONOSCOPY    . DILATION AND CURETTAGE OF UTERUS    . JOINT REPLACEMENT    . TONSILLECTOMY    . 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   Procedure: TOTAL HIP ARTHROPLASTY ANTERIOR APPROACH;  Surgeon: Dorna Leitz, MD;  Location: [place];  Service: Orthopedics;  Laterality: Left;    There were no vitals filed for this visit.      Subjective Assessment - 11/11/16 0806    Subjective I thing I overdid it yesterday, doing more and taking NO tramadol.  I walked a mile the other day and I felt good.    Currently in Pain? Yes   Pain Score 3    Pain Location Hip   Pain Orientation Left;Posterior   Pain Descriptors / Indicators Aching   Pain Type Chronic pain   Pain Onset More than a month ago   Pain Frequency Intermittent   Aggravating Factors  overworking   Pain Relieving Factors walking, stretching                [facility] Adult PT Treatment/Exercise -  11/11/16 0825      Lumbar Exercises: Quadruped   Other Quadruped Lumbar Exercises Used Pilates Reformer 1 Red   hip ext (bird dog) x 10 each side and bilateral knee stretch     Knee/Hip Exercises: Supine   Bridges Strengthening;Both;1 set;15 reps   Single Leg Bridge Strengthening;Both;1 set;5 reps   Straight Leg Raises Strengthening;Both;1 set;10 reps   Other Supine Knee/Hip Exercises Pilates Reformer Feet in Straps 1 Red Arcs and single leg hamstring   stretch                 PT Education - 11/11/16 1158    Education provided Yes   Education Details cues for HEP, modified for pain and technique, Pilates principles    Person(s) Educated Patient   Methods Explanation;Handout;Demonstration   Comprehension Verbalized understanding;Returned demonstration          PT Short Term Goals - 11/11/16 1201      PT SHORT TERM GOAL #1   Title Patient will report < 2/10 pain in the L hip with most everyday activities.    Status On-going     PT SHORT TERM GOAL #2   Title Patient will be independent with initial HEP for core stability  Status On-going     PT SHORT TERM GOAL #3   Title Pt will be able to tolerate sitting for up to 30 min with only min pain.    Status On-going           PT Long Term Goals - 11/11/16 1201      PT LONG TERM GOAL #1   Title Patient will demonstrate proper technique with home program for core and hip strengthening    Status On-going     PT LONG TERM GOAL #2   Title Patient will ambulate 2 miles without increased pain in order to perfrom IADL's and walk for exercises    Baseline did 1 mile    Status On-going     PT LONG TERM GOAL #3   Title Patient will stand for 30-45 min with min increased pain in order to perform IADL's    Status On-going     PT LONG TERM GOAL #4   Title FOTO score will improve to <25% impaired to demo functional improvement.    Status On-going               Plan - 11/11/16 1159    Clinical Impression  Statement Pt with slight increase in L hip pain, at hamstring attachment.  No goals met, does HEP and is cautious.  Min difficulty maintaining neutral pelvis in supine but had good hip and lumbar disassociation in quadruped.    PT Next Visit Plan mat level HEP for hip and core strength, progress to standing, Reformer ex. Check and measure leg length discrepancy.    PT Home Exercise Plan bridge, clam, HHPT HEP    Consulted and Agree with Plan of Care Patient      Patient will benefit from skilled therapeutic intervention in order to improve the following deficits and impairments:  Abnormal gait, Decreased mobility, Increased muscle spasms, Decreased strength, Difficulty walking, Increased fascial restricitons, Decreased knowledge of use of DME, Decreased activity tolerance, Decreased range of motion, Impaired flexibility, Pain  Visit Diagnosis: Muscle weakness (generalized)  Chronic right-sided low back pain with right-sided sciatica  Stiffness of left hip, not elsewhere classified     Problem List Patient Active Problem List   Diagnosis Date Noted  . Primary osteoarthritis of left hip 09/29/2016  . Urinary incontinence 06/25/2016  . Lumbar radiculopathy 06/18/2016  . 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 Abnormality of gait 02/02/2013  . Nonallopathic lesion of lumbar region 04/14/2011  . OSTEOARTHRITIS, HIP, LEFT 11/14/2009  . METATARSALGIA 11/14/2009  . BUNION, RIGHT FOOT 11/14/2009  . HIP PAIN, LEFT 10/02/2009  . Pain in joint, lower leg 10/02/2009  . LOW BACK PAIN, CHRONIC 10/02/2009    Viktorya Arguijo 11/11/2016, 12:07 PM  [facility] [address] [place], [place], [address] Phone: [phone]   Fax:  [phone]  Name: CRISTI GWYNN MRN: [id number] Date of Birth: 05/16/1941  [name], PT 11/11/16 12:07 PM Phone: [phone] Fax: [phone]

## 2016-11-12 DIAGNOSIS — K2970 Gastritis, unspecified, without bleeding: Secondary | ICD-10-CM | POA: Diagnosis not present

## 2016-11-12 DIAGNOSIS — Z0000 Encounter for general adult medical examination without abnormal findings: Secondary | ICD-10-CM | POA: Diagnosis not present

## 2016-11-12 DIAGNOSIS — M1990 Unspecified osteoarthritis, unspecified site: Secondary | ICD-10-CM | POA: Diagnosis not present

## 2016-11-12 DIAGNOSIS — E7800 Pure hypercholesterolemia, unspecified: Secondary | ICD-10-CM | POA: Diagnosis not present

## 2016-11-12 DIAGNOSIS — M859 Disorder of bone density and structure, unspecified: Secondary | ICD-10-CM | POA: Diagnosis not present

## 2016-11-12 DIAGNOSIS — F39 Unspecified mood [affective] disorder: Secondary | ICD-10-CM | POA: Diagnosis not present

## 2016-11-17 ENCOUNTER — Encounter: Payer: Self-pay | Admitting: Physical Therapy

## 2016-11-17 ENCOUNTER — Ambulatory Visit: Payer: PPO | Admitting: Physical Therapy

## 2016-11-17 DIAGNOSIS — M25652 Stiffness of left hip, not elsewhere classified: Secondary | ICD-10-CM

## 2016-11-17 DIAGNOSIS — M62830 Muscle spasm of back: Secondary | ICD-10-CM

## 2016-11-17 DIAGNOSIS — M6281 Muscle weakness (generalized): Secondary | ICD-10-CM | POA: Diagnosis not present

## 2016-11-17 DIAGNOSIS — G8929 Other chronic pain: Secondary | ICD-10-CM

## 2016-11-17 DIAGNOSIS — M5441 Lumbago with sciatica, right side: Secondary | ICD-10-CM

## 2016-11-17 NOTE — Therapy (Signed)
[place], [place], [address] Phone: [phone]   Fax:  [phone]  Physical Therapy Treatment  Patient Details  Name: Shelia [name] MRN: [id number] Date of Birth: 25-Jun-1941 Referring Provider: Dr. Berenice Primas   Encounter Date: 11/17/2016      PT End of Session - 11/17/16 1313    Visit Number 3   Number of Visits 16   Date for PT Re-Evaluation 12/31/16   Authorization Type healthstream medicare    PT Start Time 0803   PT Stop Time 0845   PT Time Calculation (min) 42 min   Activity Tolerance Patient tolerated treatment well   Behavior During Therapy [facility] for tasks assessed/performed      Past Medical History:  Diagnosis Date  . Anxiety   . Arthritis    "left knee; left hip; lower back; mild to moderate in right hip" (09/30/2016)  . Chronic lower back pain   . Gastritis   . Heart palpitations   . Hypercholesteremia   . Seasonal allergies     Past Surgical History:  Procedure Laterality Date  . COLONOSCOPY    . DILATION AND CURETTAGE OF UTERUS    . JOINT REPLACEMENT    . TONSILLECTOMY    . 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   Procedure: TOTAL HIP ARTHROPLASTY ANTERIOR APPROACH;  Surgeon: Dorna Leitz, MD;  Location: [place];  Service: Orthopedics;  Laterality: Left;    There were no vitals filed for this visit.      Subjective Assessment - 11/17/16 0807    Subjective When I get up in the AM I am limping like crazy.  Maybe I walked too much.  Family is in town this week.  Some mild knee pain today, still.  Adjusting to not taking the tramadol.    Currently in Pain? Yes   Pain Score 3    Pain Location Hip   Pain Orientation Left;Posterior;Lateral   Pain Descriptors / Indicators Aching   Pain Type Chronic pain   Pain Onset More than a month ago   Pain Frequency Intermittent   Pain Score 3   Pain Location Back   Pain Orientation  Right;Left;Lower   Pain Descriptors / Indicators Constant   Pain Type Chronic pain   Pain Frequency Intermittent   Aggravating Factors  standing too long    Pain Relieving Factors heat, ex                         [facility] Adult PT Treatment/Exercise - 11/17/16 6644      Neuro Re-ed    Neuro Re-ed Details  used visual feedback for step ups for LE strength and proprioception   4 inch step forward and lateral added hip abd and ext      Lumbar Exercises: Stretches   Active Hamstring Stretch 2 reps;30 seconds   ITB Stretch 2 reps;30 seconds     Lumbar Exercises: Sidelying   Clam 20 reps   Hip Abduction 20 reps     Knee/Hip Exercises: Aerobic   Stationary Bike 5 min level 2 for warm up      Knee/Hip Exercises: Seated   Sit to [employer] 15 reps     Knee/Hip Exercises: Supine   Bridges Strengthening;1 [employer] [place] Limitations used blue band with slight pressure inot the band, clam x 10 at the last rep    Straight Leg Raises Strengthening;1 set  PT Education - 11/17/16 1313    Education provided Yes   Education Details pelvic stability, use of mirror for feedback    Person(s) Educated Patient   Methods Explanation;Demonstration;Verbal cues;Tactile cues   Comprehension Verbalized understanding;Returned demonstration          PT Short Term Goals - 11/17/16 1316      PT SHORT TERM GOAL #1   Title Patient will report < 2/10 pain in the L hip with most everyday activities.    Status On-going     PT SHORT TERM GOAL #2   Title Patient will be independent with initial HEP for core stability    Status On-going     PT SHORT TERM GOAL #3   Title Pt will be able to tolerate sitting for up to 30 min with only min pain.    Status On-going           PT Long Term Goals - 11/11/16 1201      PT LONG TERM GOAL #1   Title Patient will demonstrate proper technique with home program for core and hip strengthening    Status On-going     PT LONG  TERM GOAL #2   Title Patient will ambulate 2 miles without increased pain in order to perfrom IADL's and walk for exercises    Baseline did 1 mile    Status On-going     PT LONG TERM GOAL #3   Title Patient will stand for 30-45 min with min increased pain in order to perform IADL's    Status On-going     PT LONG TERM GOAL #4   Title FOTO score will improve to <25% impaired to demo functional improvement.    Status On-going               Plan - 11/17/16 1314    Clinical Impression Statement Worked towards goals today, including balancing pelvic stability with LE alignment. Needs cues to avoid hip adduction with sit to stand. Pain increased but she is doing alot and not taking Tramadol.    PT Next Visit Plan mat level HEP for hip and core strength, progress to standing, Reformer ex. Check and measure leg length discrepancy.    PT Home Exercise Plan bridge, clam, HHPT HEP , hip abd in SL and standing , sit to stand with band    Consulted and Agree with Plan of Care Patient      Patient will benefit from skilled therapeutic intervention in order to improve the following deficits and impairments:  Abnormal gait, Decreased mobility, Increased muscle spasms, Decreased strength, Difficulty walking, Increased fascial restricitons, Decreased knowledge of use of DME, Decreased activity tolerance, Decreased range of motion, Impaired flexibility, Pain  Visit Diagnosis: Muscle weakness (generalized)  Chronic right-sided low back pain with right-sided sciatica  Stiffness of left hip, not elsewhere classified  Muscle spasm of back     Problem List Patient Active Problem List   Diagnosis Date Noted  . Primary osteoarthritis of left hip 09/29/2016  . Urinary incontinence 06/25/2016  . Lumbar radiculopathy 06/18/2016  . 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 Abnormality of gait 02/02/2013  . Nonallopathic lesion of lumbar region 04/14/2011  . OSTEOARTHRITIS, HIP, LEFT 11/14/2009  .  METATARSALGIA 11/14/2009  . BUNION, RIGHT FOOT 11/14/2009  . HIP PAIN, LEFT 10/02/2009  . Pain in joint, lower leg 10/02/2009  . LOW BACK PAIN, CHRONIC 10/02/2009    Mitchell Epling 11/17/2016, 1:18 PM  [facility]  [place], [place], [address] Phone: [phone]   Fax:  [phone]  Name: LATAJA NEWLAND MRN: [id number] Date of Birth: 1941-04-23  [name], PT 11/17/16 1:18 PM Phone: [phone] Fax: [phone]

## 2016-11-18 DIAGNOSIS — M1612 Unilateral primary osteoarthritis, left hip: Secondary | ICD-10-CM | POA: Diagnosis not present

## 2016-11-19 ENCOUNTER — Ambulatory Visit: Payer: PPO | Admitting: Physical Therapy

## 2016-11-25 ENCOUNTER — Ambulatory Visit: Payer: PPO | Attending: Orthopedic Surgery | Admitting: Physical Therapy

## 2016-11-25 ENCOUNTER — Encounter: Payer: Self-pay | Admitting: Physical Therapy

## 2016-11-25 DIAGNOSIS — M6281 Muscle weakness (generalized): Secondary | ICD-10-CM | POA: Diagnosis not present

## 2016-11-25 DIAGNOSIS — G8929 Other chronic pain: Secondary | ICD-10-CM | POA: Insufficient documentation

## 2016-11-25 DIAGNOSIS — M5441 Lumbago with sciatica, right side: Secondary | ICD-10-CM | POA: Diagnosis not present

## 2016-11-25 DIAGNOSIS — M25652 Stiffness of left hip, not elsewhere classified: Secondary | ICD-10-CM | POA: Insufficient documentation

## 2016-11-25 DIAGNOSIS — M62830 Muscle spasm of back: Secondary | ICD-10-CM | POA: Diagnosis not present

## 2016-11-25 NOTE — Therapy (Signed)
[place], [place], [address] Phone: [phone]   Fax:  [phone]  Physical Therapy Treatment  Patient Details  Name: Shelia [name] MRN: [id number] Date of Birth: 09-10-1940 Referring Provider: Dr. Berenice Primas   Encounter Date: 11/25/2016      PT End of Session - 11/25/16 1024    Visit Number 4   Number of Visits 16   Date for PT Re-Evaluation 12/31/16   Authorization Type [employer] medicare    PT Start Time 0935   PT Stop Time 1028   PT Time Calculation (min) 53 min   Activity Tolerance Patient tolerated treatment well   Behavior During Therapy [facility] for tasks assessed/performed      Past Medical History:  Diagnosis Date  . Anxiety   . Arthritis    "left knee; left hip; lower back; mild to moderate in right hip" (09/30/2016)  . Chronic lower back pain   . Gastritis   . Heart palpitations   . Hypercholesteremia   . Seasonal allergies     Past Surgical History:  Procedure Laterality Date  . COLONOSCOPY    . DILATION AND CURETTAGE OF UTERUS    . JOINT REPLACEMENT    . TONSILLECTOMY    . 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   Procedure: TOTAL HIP ARTHROPLASTY ANTERIOR APPROACH;  Surgeon: Dorna Leitz, MD;  Location: [place];  Service: Orthopedics;  Laterality: Left;    There were no vitals filed for this visit.      Subjective Assessment - 11/25/16 0940    Subjective Walked about 2 miles Saturday.  Feeling stronger, still has moderate pain in LLE with walking.  Pain in the AM is worse.  Limping to the bathroom.  Dr. Berenice Primas appt, did another XR, everything was good.  He mentioned about 3 mos pain would ease.    Currently in Pain? Yes   Pain Score 3    Pain Location Hip   Pain Orientation Left   Pain Descriptors / Indicators Aching;Sore   Pain Type Chronic pain   Pain Onset More than a month ago   Pain Frequency Intermittent   Aggravating  Factors  early in AM, overdoing    Pain Relieving Factors walking, exercising, heat                          [facility] Adult PT Treatment/Exercise - 11/25/16 0953      Lumbar Exercises: Stretches   Lower Trunk Rotation 5 reps;10 seconds   Lower Trunk Rotation Limitations oblique ball twist      Lumbar Exercises: Standing   Other Standing Lumbar Exercises hip ext over      Lumbar Exercises: Seated   Hip Flexion on Ball Strengthening;Both;20 reps   Hip Flexion on Ball Limitations forward fold to stretch low back    Other Seated Lumbar Exercises added opposite arm lift   Other Seated Lumbar Exercises standing hip ext and "swimming" with opp arm lift      Lumbar Exercises: Supine   Heel Slides 10 reps   Bridge 10 reps   Bridge Limitations 2 sets, with a therapy ball,  1 set no UE support   Straight Leg Raise 10 reps   Straight Leg Raises Limitations with ball    Large Ball Oblique Isometric 10 reps   Large Ball Oblique Isometric Limitations dead bug with ball      Moist Heat  Therapy   Number Minutes Moist Heat 10 Minutes   Moist Heat Location Lumbar Spine  hip                 PT Education - 11/25/16 1302    Education provided Yes   Education Details various core ex using therapy ball    Person(s) Educated Patient   Methods Explanation;Demonstration;Tactile cues;Verbal cues;Handout   Comprehension Verbalized understanding;Returned demonstration          PT Short Term Goals - 11/17/16 1316      PT SHORT TERM GOAL #1   Title Patient will report < 2/10 pain in the L hip with most everyday activities.    Status On-going     PT SHORT TERM GOAL #2   Title Patient will be independent with initial HEP for core stability    Status On-going     PT SHORT TERM GOAL #3   Title Pt will be able to tolerate sitting for up to 30 min with only min pain.    Status On-going           PT Long Term Goals - 11/11/16 1201      PT LONG TERM GOAL #1   Title  Patient will demonstrate proper technique with home program for core and hip strengthening    Status On-going     PT LONG TERM GOAL #2   Title Patient will ambulate 2 miles without increased pain in order to perfrom IADL's and walk for exercises    Baseline did 1 mile    Status On-going     PT LONG TERM GOAL #3   Title Patient will stand for 30-45 min with min increased pain in order to perform IADL's    Status On-going     PT LONG TERM GOAL #4   Title FOTO score will improve to <25% impaired to demo functional improvement.    Status On-going               Plan - 11/25/16 1302    Clinical Impression Statement Patient improving functionally but with a moderate amount of pain with standing, walking 2 miles.  She has a ball at home and wanted to learn exercises she could do with it at home.    PT Next Visit Plan check goals and MMT , mat level HEP for hip and core strength, progress to standing, Reformer ex. Check and measure leg length discrepancy.    PT Home Exercise Plan bridge, clam, HHPT HEP , hip abd in SL and standing , sit to stand with band , ball ex    Consulted and Agree with Plan of Care Patient      Patient will benefit from skilled therapeutic intervention in order to improve the following deficits and impairments:  Abnormal gait, Decreased mobility, Increased muscle spasms, Decreased strength, Difficulty walking, Increased fascial restricitons, Decreased knowledge of use of DME, Decreased activity tolerance, Decreased range of motion, Impaired flexibility, Pain  Visit Diagnosis: Muscle weakness (generalized)  Chronic right-sided low back pain with right-sided sciatica  Stiffness of left hip, not elsewhere classified  Muscle spasm of back     Problem List Patient Active Problem List   Diagnosis Date Noted  . Primary osteoarthritis of left hip 09/29/2016  . Urinary incontinence 06/25/2016  . Lumbar radiculopathy 06/18/2016  . Leg length inequality  02/02/2013  . Abnormality of gait 02/02/2013  . Nonallopathic lesion of lumbar region 04/14/2011  . OSTEOARTHRITIS, HIP, LEFT 11/14/2009  .  METATARSALGIA 11/14/2009  . BUNION, RIGHT FOOT 11/14/2009  . HIP PAIN, LEFT 10/02/2009  . Pain in joint, lower leg 10/02/2009  . LOW BACK PAIN, CHRONIC 10/02/2009    Woodrow Dulski 11/25/2016, 1:10 PM  [facility] [address] [place], [place], [address] Phone: [phone]   Fax:  [phone]  Name: LILIT CINELLI MRN: [id number] Date of Birth: 1940/11/17  [name], PT 11/25/16 1:10 PM Phone: [phone] Fax: [phone]

## 2016-11-25 NOTE — Patient Instructions (Signed)
      DO THIS ONE WITH YOUR HIPS ON THE GROUND< NOT IN THE BRIDGE POSITION

## 2016-11-28 ENCOUNTER — Ambulatory Visit: Payer: PPO | Admitting: Physical Therapy

## 2016-11-28 DIAGNOSIS — M25652 Stiffness of left hip, not elsewhere classified: Secondary | ICD-10-CM

## 2016-11-28 DIAGNOSIS — M62830 Muscle spasm of back: Secondary | ICD-10-CM

## 2016-11-28 DIAGNOSIS — M6281 Muscle weakness (generalized): Secondary | ICD-10-CM

## 2016-11-28 DIAGNOSIS — G8929 Other chronic pain: Secondary | ICD-10-CM

## 2016-11-28 DIAGNOSIS — M5441 Lumbago with sciatica, right side: Secondary | ICD-10-CM

## 2016-11-28 NOTE — Therapy (Signed)
[facility] [address] [place], [place], [address] Phone: [phone]   Fax:  [phone]  Physical Therapy Treatment  Patient Details  Name: Shelia [name] MRN: [id number] Date of Birth: 09/21/1940 Referring Provider: Dr. Graves   Encounter Date: 11/28/2016      PT End of Session - 11/28/16 0819    Visit Number 5   Number of Visits 16   Date for PT Re-Evaluation 12/31/16   Authorization Type [employer] medic[employer]    PT Start Time 0815  pt late   PT Stop Time 0850   PT Time Calculation (min) 35 min   Activity Tolerance Patient tolerated treatment well   Behavior During Therapy [facility] for tasks assessed/performed      Past Medical History:  Diagnosis Date  . Anxiety   . Arthritis    "left knee; left hip; lower back; mild to moderate in right hip" (09/30/2016)  . Chronic lower back pain   . Gastritis   . Heart palpitations   . Hypercholesteremia   . Seasonal allergies     Past Surgical History:  Procedure Laterality Date  . COLONOSCOPY    . DILATION AND CURETTAGE OF UTERUS    . JOINT REPLACEMENT    . TONSILLECTOMY    . 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   Procedure: TOTAL HIP ARTHROPLASTY ANTERIOR APPROACH;  Surgeon: John Graves, MD;  Location[facility];  Service: Orthopedics;  Laterality: Left;    There were no vitals filed for this visit.      Subjective Assessment - 11/28/16 0814    Subjective Not really hurting today.  Been doing good, walking and does her HEP.    Currently in Pain? Yes   Pain Score 3    Pain Location Hip   Pain Orientation Left   Pain Descriptors / Indicators Sore   Pain Type Chronic pain   Pain Onset More than a month ago   Pain Frequency Intermittent   Pain Score 3   Pain Location Back   Pain Orientation Lower   Pain Descriptors / Indicators Sore   Pain Type Chronic pain   Pain Onset More than a month ago              [facility]  Adult PT Treatment/Exercise - 11/28/16 0824      Lumbar Exercises: Standing   Functional Squats 15 reps   Functional Squats Limitations on foam    Other Standing Lumbar Exercises standing squat on tile floor x 15 improves hip hinge, less WB through LLE   Other Standing Lumbar Exercises added blue band to squat for even better hip activation x 10   lateral band walks 4 x 15 feet      Lumbar Exercises: Supine   Straight Leg Raise 10 reps     Lumbar Exercises: Sidelying   Hip Abduction 10 reps   Other Sidelying Lumbar Exercises adduction x 10                 PT Education - 11/28/16 1227    Education provided Yes   Education Details heel lift    Person(s) Educated Patient   Methods Explanation   Comprehension Verbalized understanding;Returned demonstration          PT Short Term Goals - 11/28/16 0816      PT SHORT TERM GOAL #1   Title Patient will report < 2/10 pain in the L hip with most everyday activities.      Status Partially Met     PT SHORT TERM GOAL #2   Title Patient will be independent with initial HEP for core stability    Status Achieved     PT SHORT TERM GOAL #3   Title Pt will be able to tolerate sitting for up to 30 min with only min pain.    Status Achieved           PT Long Term Goals - 11/28/16 0817      PT LONG TERM GOAL #1   Title Patient will demonstrate proper technique with home program for core and hip strengthening    Status On-going     PT LONG TERM GOAL #2   Title Patient will ambulate 2 miles without increased pain in order to perfrom IADL's and walk for exercises    Baseline pain after the walk, but not during   Status Partially Met     PT LONG TERM GOAL #3   Title Patient will stand for 30-45 min with min increased pain in order to perform IADL's    Status On-going     PT LONG TERM GOAL #4   Title FOTO score will improve to <25% impaired to demo functional improvement.    Status On-going               Plan -  11/28/16 1228    Clinical Impression Statement Gave patient a heel lift to see if that improves discomfort in her LLT after walking.  Shortened session due to being a bit late. Doing well overall and even though she has pain she continues to improve functionally.    PT Next Visit Plan  mat level HEP for hip and core strength, progress to standing, Reformer ex. Did she notice a difference with the  heel lift.    PT Home Exercise Plan bridge, clam, HHPT HEP , hip abd in SL and standing , sit to stand with band , ball ex    Consulted and Agree with Plan of Care Patient      Patient will benefit from skilled therapeutic intervention in order to improve the following deficits and impairments:  Abnormal gait, Decreased mobility, Increased muscle spasms, Decreased strength, Difficulty walking, Increased fascial restricitons, Decreased knowledge of use of DME, Decreased activity tolerance, Decreased range of motion, Impaired flexibility, Pain  Visit Diagnosis: Muscle weakness (generalized)  Chronic right-sided low back pain with right-sided sciatica  Stiffness of left hip, not elsewhere classified  Muscle spasm of back     Problem List Patient Active Problem List   Diagnosis Date Noted  . Primary osteoarthritis of left hip 09/29/2016  . Urinary incontinence 06/25/2016  . Lumbar radiculopathy 06/18/2016  . 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 Abnormality of gait 02/02/2013  . Nonallopathic lesion of lumbar region 04/14/2011  . OSTEOARTHRITIS, HIP, LEFT 11/14/2009  . METATARSALGIA 11/14/2009  . BUNION, RIGHT FOOT 11/14/2009  . HIP PAIN, LEFT 10/02/2009  . Pain in joint, lower leg 10/02/2009  . LOW BACK PAIN, CHRONIC 10/02/2009    [name],[name] 11/28/2016, 12:34 PM  [facility] [address] [place], [place], [address] Phone: [phone]   Fax:  [phone]  Name: Navjot E Antonellis MRN: [id number] Date of Birth:  08/29/1940  [name] [name], PT 11/28/16 12:34 PM Phone: [phone] Fax: [phone]  

## 2016-12-01 ENCOUNTER — Ambulatory Visit: Payer: PPO | Admitting: Physical Therapy

## 2016-12-01 DIAGNOSIS — M25652 Stiffness of left hip, not elsewhere classified: Secondary | ICD-10-CM

## 2016-12-01 DIAGNOSIS — M5441 Lumbago with sciatica, right side: Secondary | ICD-10-CM

## 2016-12-01 DIAGNOSIS — M62830 Muscle spasm of back: Secondary | ICD-10-CM

## 2016-12-01 DIAGNOSIS — M6281 Muscle weakness (generalized): Secondary | ICD-10-CM | POA: Diagnosis not present

## 2016-12-01 DIAGNOSIS — G8929 Other chronic pain: Secondary | ICD-10-CM

## 2016-12-01 NOTE — Therapy (Signed)
[place], [place], [address] Phone: [phone]   Fax:  [phone]  Physical Therapy Treatment  Patient Details  Name: Shelia [name] MRN: [id number] Date of Birth: August 05, 1940 Referring Provider: Dr. Berenice Primas   Encounter Date: 12/01/2016      PT End of Session - 12/01/16 0845    Visit Number 6   Number of Visits 16   Date for PT Re-Evaluation 12/31/16   Authorization Type [employer] medicare    PT Start Time 0800   PT Stop Time 0845   PT Time Calculation (min) 45 min   Activity Tolerance Patient tolerated treatment well   Behavior During Therapy [facility] for tasks assessed/performed      Past Medical History:  Diagnosis Date  . Anxiety   . Arthritis    "left knee; left hip; lower back; mild to moderate in right hip" (09/30/2016)  . Chronic lower back pain   . Gastritis   . Heart palpitations   . Hypercholesteremia   . Seasonal allergies     Past Surgical History:  Procedure Laterality Date  . COLONOSCOPY    . DILATION AND CURETTAGE OF UTERUS    . JOINT REPLACEMENT    . TONSILLECTOMY    . 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   Procedure: TOTAL HIP ARTHROPLASTY ANTERIOR APPROACH;  Surgeon: Dorna Leitz, MD;  Location: [place];  Service: Orthopedics;  Laterality: Left;    There were no vitals filed for this visit.      Subjective Assessment - 12/01/16 0804    Subjective She actually forgot about the heel lift.  She wore it on the walk last night and she had no pain, though.  She didnt realize it was under the insert.    Currently in Pain? Yes   Pain Score 3    Pain Location Back   Pain Orientation Left   Pain Descriptors / Indicators Discomfort   Pain Type Chronic pain   Pain Onset More than a month ago   Pain Frequency Intermittent            [facility] Adult PT Treatment/Exercise - 12/01/16 0817      Lumbar Exercises: Machines for  Strengthening   Other Lumbar Machine Exercise Pilates Reformer      Pilates Reformer used for LE/core strength, postural strength, lumbopelvic disassociation and core control.  Exercises included:  Footwork 2 Red 1 blue parallel and turn out, used double and single leg positions .  Used spring board for BOS, smaller ROM.    Supine Arm work 1 Red 1 Yellow Arcs, and circles   Supine Abs  iso UE shoulder with LE taps and single leg stretch    Feet in Straps 1 Red 1 Yellow Arcs, parallel, turn out and turn in, circles and stretching  Downstretch 1 Red x 10, added obliques for x 6 each       PT Education - 12/01/16 1209    Education provided Yes   Education Details Research officer, political party and Editor, commissioning) Educated Patient   Methods Explanation   Comprehension Verbalized understanding    Patient needs cues for maintaining neutral spine and engaging core mm in neutral.        PT Short Term Goals - 11/28/16 0816      PT SHORT TERM GOAL #1   Title Patient will report < 2/10 pain in the L hip with most everyday activities.  Status Partially Met     PT SHORT TERM GOAL #2   Title Patient will be independent with initial HEP for core stability    Status Achieved     PT SHORT TERM GOAL #3   Title Pt will be able to tolerate sitting for up to 30 min with only min pain.    Status Achieved           PT Long Term Goals - 11/28/16 0817      PT LONG TERM GOAL #1   Title Patient will demonstrate proper technique with home program for core and hip strengthening    Status On-going     PT LONG TERM GOAL #2   Title Patient will ambulate 2 miles without increased pain in order to perfrom IADL's and walk for exercises    Baseline pain after the walk, but not during   Status Partially Met     PT LONG TERM GOAL #3   Title Patient will stand for 30-45 min with min increased pain in order to perform IADL's    Status On-going     PT LONG TERM GOAL #4   Title FOTO score will  improve to <25% impaired to demo functional improvement.    Status On-going               Plan - 12/01/16 1214    Clinical Impression Statement Patient did well, needed alot of cues today for neutral vs posterior pelvic tilt. The heel lift may have helped but she honestly forgot about it. Progressing towards goals.    PT Next Visit Plan  mat level HEP for hip and core strength, progress to standing, Reformer ex.    PT Home Exercise Plan bridge, clam, HHPT HEP , hip abd in SL and standing , sit to stand with band , ball ex    Consulted and Agree with Plan of Care Patient      Patient will benefit from skilled therapeutic intervention in order to improve the following deficits and impairments:  Abnormal gait, Decreased mobility, Increased muscle spasms, Decreased strength, Difficulty walking, Increased fascial restricitons, Decreased knowledge of use of DME, Decreased activity tolerance, Decreased range of motion, Impaired flexibility, Pain  Visit Diagnosis: Muscle weakness (generalized)  Chronic right-sided low back pain with right-sided sciatica  Stiffness of left hip, not elsewhere classified  Muscle spasm of back     Problem List Patient Active Problem List   Diagnosis Date Noted  . Primary osteoarthritis of left hip 09/29/2016  . Urinary incontinence 06/25/2016  . Lumbar radiculopathy 06/18/2016  . 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 Abnormality of gait 02/02/2013  . Nonallopathic lesion of lumbar region 04/14/2011  . OSTEOARTHRITIS, HIP, LEFT 11/14/2009  . METATARSALGIA 11/14/2009  . BUNION, RIGHT FOOT 11/14/2009  . HIP PAIN, LEFT 10/02/2009  . Pain in joint, lower leg 10/02/2009  . LOW BACK PAIN, CHRONIC 10/02/2009    Darel Ricketts 12/01/2016, 12:42 PM  [facility] [address] [place], [place], [address] Phone: [phone]   Fax:  [phone]  Name: Shelia [name] MRN: [id number] Date of Birth:  [birth year]  [name], PT 12/01/16 12:42 PM Phone: [phone] Fax: [phone]

## 2016-12-04 ENCOUNTER — Ambulatory Visit: Payer: PPO | Admitting: Physical Therapy

## 2016-12-04 DIAGNOSIS — G8929 Other chronic pain: Secondary | ICD-10-CM

## 2016-12-04 DIAGNOSIS — M62830 Muscle spasm of back: Secondary | ICD-10-CM

## 2016-12-04 DIAGNOSIS — M6281 Muscle weakness (generalized): Secondary | ICD-10-CM

## 2016-12-04 DIAGNOSIS — M25652 Stiffness of left hip, not elsewhere classified: Secondary | ICD-10-CM

## 2016-12-04 DIAGNOSIS — M5441 Lumbago with sciatica, right side: Secondary | ICD-10-CM

## 2016-12-04 NOTE — Therapy (Signed)
[place], [place], [address] Phone: [phone]   Fax:  [phone]  Physical Therapy Treatment  Patient Details  Name: Shelia [name] MRN: [id number] Date of Birth: [birth year] Referring Provider: Dr. Berenice Primas   Encounter Date: 12/04/2016      PT End of Session - 12/04/16 1108    Visit Number 7   Number of Visits 16   Date for PT Re-Evaluation 12/31/16   PT Start Time 6606   PT Stop Time 1100   PT Time Calculation (min) 45 min   Activity Tolerance Patient tolerated treatment well   Behavior During Therapy [facility] for tasks assessed/performed      Past Medical History:  Diagnosis Date  . Anxiety   . Arthritis    "left knee; left hip; lower back; mild to moderate in right hip" (09/30/2016)  . Chronic lower back pain   . Gastritis   . Heart palpitations   . Hypercholesteremia   . Seasonal allergies     Past Surgical History:  Procedure Laterality Date  . COLONOSCOPY    . DILATION AND CURETTAGE OF UTERUS    . JOINT REPLACEMENT    . TONSILLECTOMY    . 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   Procedure: TOTAL HIP ARTHROPLASTY ANTERIOR APPROACH;  Surgeon: Dorna Leitz, MD;  Location: [place];  Service: Orthopedics;  Laterality: Left;    There were no vitals filed for this visit.      Subjective Assessment - 12/04/16 1019    Subjective Had some questions about her HEP.  I am so surprised how stiff and painful my back and LLE is.     Currently in Pain? Yes   Pain Score 1    Pain Location Back   Pain Orientation Left;Lower   Pain Descriptors / Indicators Discomfort   Pain Type Chronic pain   Pain Radiating Towards LLE, hip   Pain Onset More than a month ago   Pain Frequency Intermittent   Aggravating Factors  AM hours , standing still    Pain Relieving Factors walking, exercise, heat,, stretching    Pain Score 1   Pain Location Hip   Pain Orientation  Left   Pain Descriptors / Indicators Discomfort   Pain Type Chronic pain   Pain Onset More than a month ago   Pain Frequency Intermittent   Aggravating Factors  standing    Pain Relieving Factors PT, ther ex, heat               [facility] Adult PT Treatment/Exercise - 12/04/16 1027      Self-Care   Posture symmetry of hips, pelvis, ongoing    Other Self-Care Comments  Pilates, neutral spine      Lumbar Exercises: Stretches   Pelvic Tilt 10 seconds   Pelvic Tilt Limitations x 10 A/P      Lumbar Exercises: Standing   Other Standing Lumbar Exercises Used Pilates Reformer for Scooter: 1 Red each side about 20 reps, focusing on level pelvis and balance with min amount of UE assist.    Other Standing Lumbar Exercises ant hip stretch on Reformer 1 Red      Lumbar Exercises: Supine   Clam 20 reps   Clam Limitations blue loop, and x 10 unilateral    Bridge 10 reps   Bridge Limitations bridge and clam with blue loop      Lumbar Exercises: Sidelying   Clam 20 reps  Lumbar Exercises: Quadruped   Straight Leg Raise 10 reps   Straight Leg Raises Limitations each leg with blue loop    Other Quadruped Lumbar Exercises hip extension with blue band looped around thighs x 10 each                 PT Education - 12/04/16 1209    Education provided Yes   Education Details reinforced HEP, stabilty ball fit for sitting    Person(s) Educated Patient   Methods Explanation   Comprehension Verbalized understanding          PT Short Term Goals - 11/28/16 0816      PT SHORT TERM GOAL #1   Title Patient will report < 2/10 pain in the L hip with most everyday activities.    Status Partially Met     PT SHORT TERM GOAL #2   Title Patient will be independent with initial HEP for core stability    Status Achieved     PT SHORT TERM GOAL #3   Title Pt will be able to tolerate sitting for up to 30 min with only min pain.    Status Achieved           PT Long Term Goals -  12/04/16 1102      PT LONG TERM GOAL #1   Title Patient will demonstrate proper technique with home program for core and hip strengthening    Status On-going     PT LONG TERM GOAL #2   Title Patient will ambulate 2 miles without increased pain in order to perfrom IADL's and walk for exercises    Baseline pain after the walk, but not during   Status Partially Met     PT LONG TERM GOAL #3   Title Patient will stand for 30-45 min with min increased pain in order to perform IADL's    Baseline improving   Status On-going     PT LONG TERM GOAL #4   Title FOTO score will improve to <25% impaired to demo functional improvement.    Status On-going               Plan - 12/04/16 1210    Clinical Impression Statement Pt brought in her ball to be inflated.  Reviewed HEP and more importantly, concepts for gaining full function.  She was able to yesterday to walk at a higher speed to make it to an event without taking pain meds.  Improving and progressing towards goals.    PT Next Visit Plan  mat level HEP for hip and core strength, progress to standing, Reformer ex.    PT Home Exercise Plan bridge, clam, HHPT HEP , hip abd in SL and standing , sit to stand with band , ball ex    Consulted and Agree with Plan of Care Patient      Patient will benefit from skilled therapeutic intervention in order to improve the following deficits and impairments:  Abnormal gait, Decreased mobility, Increased muscle spasms, Decreased strength, Difficulty walking, Increased fascial restricitons, Decreased knowledge of use of DME, Decreased activity tolerance, Decreased range of motion, Impaired flexibility, Pain  Visit Diagnosis: Muscle weakness (generalized)  Chronic right-sided low back pain with right-sided sciatica  Stiffness of left hip, not elsewhere classified  Muscle spasm of back     Problem List Patient Active Problem List   Diagnosis Date Noted  . Primary osteoarthritis of left hip  09/29/2016  . Urinary incontinence 06/25/2016  .  Lumbar radiculopathy 06/18/2016  . 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 Abnormality of gait 02/02/2013  . Nonallopathic lesion of lumbar region 04/14/2011  . OSTEOARTHRITIS, HIP, LEFT 11/14/2009  . METATARSALGIA 11/14/2009  . BUNION, RIGHT FOOT 11/14/2009  . HIP PAIN, LEFT 10/02/2009  . Pain in joint, lower leg 10/02/2009  . LOW BACK PAIN, CHRONIC 10/02/2009    [name],[name] 12/04/2016, 12:17 PM  [facility] [address] [place], [place], [address] Phone: [phone]   Fax:  [phone]  Name: Shelia [name] MRN: [id number] Date of Birth: February 17, 1941  [name], PT 12/04/16 12:17 PM Phone: [phone] Fax: [phone]

## 2016-12-09 ENCOUNTER — Ambulatory Visit: Payer: PPO | Admitting: Physical Therapy

## 2016-12-09 DIAGNOSIS — M5441 Lumbago with sciatica, right side: Secondary | ICD-10-CM

## 2016-12-09 DIAGNOSIS — M62830 Muscle spasm of back: Secondary | ICD-10-CM

## 2016-12-09 DIAGNOSIS — G8929 Other chronic pain: Secondary | ICD-10-CM

## 2016-12-09 DIAGNOSIS — M6281 Muscle weakness (generalized): Secondary | ICD-10-CM | POA: Diagnosis not present

## 2016-12-09 DIAGNOSIS — M25652 Stiffness of left hip, not elsewhere classified: Secondary | ICD-10-CM

## 2016-12-09 NOTE — Therapy (Signed)
[place], [place], [address] Phone: [phone]   Fax:  [phone]  Physical Therapy Treatment  Patient Details  Name: Shelia [name] MRN: [id number] Date of Birth: 10-09-1940 Referring Provider: Dr. Berenice Primas   Encounter Date: 12/09/2016      PT End of Session - 12/09/16 0937    Visit Number 8   Number of Visits 16   Date for PT Re-Evaluation 12/31/16   Authorization Type [employer] medicare    PT Start Time 0847   PT Stop Time 0935   PT Time Calculation (min) 48 min   Activity Tolerance Patient tolerated treatment well   Behavior During Therapy [facility] for tasks assessed/performed      Past Medical History:  Diagnosis Date  . Anxiety   . Arthritis    "left knee; left hip; lower back; mild to moderate in right hip" (09/30/2016)  . Chronic lower back pain   . Gastritis   . Heart palpitations   . Hypercholesteremia   . Seasonal allergies     Past Surgical History:  Procedure Laterality Date  . COLONOSCOPY    . DILATION AND CURETTAGE OF UTERUS    . JOINT REPLACEMENT    . TONSILLECTOMY    . 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   Procedure: TOTAL HIP ARTHROPLASTY ANTERIOR APPROACH;  Surgeon: Dorna Leitz, MD;  Location: [place];  Service: Orthopedics;  Laterality: Left;    There were no vitals filed for this visit.      Subjective Assessment - 12/09/16 0848    Subjective Trying to cut back more on meds so i'm having a bit more pain.    Currently in Pain? Yes   Pain Score 2    Pain Location Hip   Pain Orientation Left;Proximal;Lateral   Pain Descriptors / Indicators Tightness   Pain Type Chronic pain   Pain Onset More than a month ago   Pain Frequency Intermittent   Aggravating Factors  overdoing it    Pain Relieving Factors stretching, heat                          [facility] Adult PT Treatment/Exercise - 12/09/16 0904      Neuro Re-ed    Neuro Re-ed Details  worked with BOSU in parallels bars: alternating lunge, static hold   lunge and head turns     Lumbar Exercises: Stretches   Active Hamstring Stretch 2 reps;30 seconds     Lumbar Exercises: Aerobic   Elliptical 7 min, level 1 resist, level 5 ramp      Knee/Hip Exercises: Standing   Functional Squat 1 set;10 reps   SLS with Vectors blue band looped: hip abd and extension x 10 each , each leg no UE for balance challenge    Other Standing Knee Exercises lateral band walks                 PT Education - 12/09/16 0929    Education provided Yes   Education Details balance and resistance loops   Person(s) Educated Patient   Methods Explanation   Comprehension Verbalized understanding          PT Short Term Goals - 12/09/16 1142      PT SHORT TERM GOAL #1   Title Patient will report < 2/10 pain in the L hip with most everyday activities.    Status Partially Met  PT SHORT TERM GOAL #2   Title Patient will be independent with initial HEP for core stability    Status Achieved     PT SHORT TERM GOAL #3   Title Pt will be able to tolerate sitting for up to 30 min with only min pain.    Status Achieved           PT Long Term Goals - 12/09/16 1143      PT LONG TERM GOAL #1   Title Patient will demonstrate proper technique with home program for core and hip strengthening    Status On-going     PT LONG TERM GOAL #2   Title Patient will ambulate 2 miles without increased pain in order to perfrom IADL's and walk for exercises    Status Partially Met     PT LONG TERM GOAL #3   Title Patient will stand for 30-45 min with min increased pain in order to perform IADL's    Baseline improving   Status On-going     PT LONG TERM GOAL #4   Title FOTO score will improve to <25% impaired to demo functional improvement.    Status Unable to assess               Plan - 12/09/16 1140    Clinical Impression Statement Worked on  balance and coordination in standing using bands, BOSU and visual cues.  Making great progress and not taking as much pain medicine.     PT Next Visit Plan  mat level HEP for hip and core strength, progress to standing, Reformer ex.    PT Home Exercise Plan bridge, clam, HHPT HEP , hip abd in SL and standing , sit to stand with band , ball ex    Consulted and Agree with Plan of Care Patient      Patient will benefit from skilled therapeutic intervention in order to improve the following deficits and impairments:  Abnormal gait, Decreased mobility, Increased muscle spasms, Decreased strength, Difficulty walking, Increased fascial restricitons, Decreased knowledge of use of DME, Decreased activity tolerance, Decreased range of motion, Impaired flexibility, Pain  Visit Diagnosis: Muscle weakness (generalized)  Chronic right-sided low back pain with right-sided sciatica  Stiffness of left hip, not elsewhere classified  Muscle spasm of back     Problem List Patient Active Problem List   Diagnosis Date Noted  . Primary osteoarthritis of left hip 09/29/2016  . Urinary incontinence 06/25/2016  . Lumbar radiculopathy 06/18/2016  . 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 Abnormality of gait 02/02/2013  . Nonallopathic lesion of lumbar region 04/14/2011  . OSTEOARTHRITIS, HIP, LEFT 11/14/2009  . METATARSALGIA 11/14/2009  . BUNION, RIGHT FOOT 11/14/2009  . HIP PAIN, LEFT 10/02/2009  . Pain in joint, lower leg 10/02/2009  . LOW BACK PAIN, CHRONIC 10/02/2009    [name],[name] 12/09/2016, 11:46 AM  [facility] [address] [place], [place], [address] Phone: [phone]   Fax:  [phone]  Name: STEPHAINE BRESHEARS MRN: [id number] Date of Birth: 07/11/1941  [name], PT 12/09/16 11:46 AM Phone: [phone] Fax: [phone]

## 2016-12-11 ENCOUNTER — Ambulatory Visit: Payer: PPO | Admitting: Physical Therapy

## 2016-12-11 DIAGNOSIS — H10419 Chronic giant papillary conjunctivitis, unspecified eye: Secondary | ICD-10-CM | POA: Diagnosis not present

## 2016-12-11 DIAGNOSIS — M5441 Lumbago with sciatica, right side: Secondary | ICD-10-CM

## 2016-12-11 DIAGNOSIS — M6281 Muscle weakness (generalized): Secondary | ICD-10-CM

## 2016-12-11 DIAGNOSIS — M62830 Muscle spasm of back: Secondary | ICD-10-CM

## 2016-12-11 DIAGNOSIS — M25652 Stiffness of left hip, not elsewhere classified: Secondary | ICD-10-CM

## 2016-12-11 DIAGNOSIS — G8929 Other chronic pain: Secondary | ICD-10-CM

## 2016-12-11 NOTE — Therapy (Signed)
[place], [place], [address] Phone: [phone]   Fax:  [phone]  Physical Therapy Treatment  Patient Details  Name: Shelia [name] MRN: [id number] Date of Birth: July 04, 1941 Referring Provider: Dr. Berenice Primas   Encounter Date: 12/11/2016      PT End of Session - 12/11/16 1030    Visit Number 9   Number of Visits 16   Date for PT Re-Evaluation 12/31/16   Authorization Type [employer] medicare    Authorization Time Period KX MOD at 15 visits    PT Start Time 1016   PT Stop Time 1103   PT Time Calculation (min) 47 min   Activity Tolerance Patient tolerated treatment well   Behavior During Therapy [facility] for tasks assessed/performed      Past Medical History:  Diagnosis Date  . Anxiety   . Arthritis    "left knee; left hip; lower back; mild to moderate in right hip" (09/30/2016)  . Chronic lower back pain   . Gastritis   . Heart palpitations   . Hypercholesteremia   . Seasonal allergies     Past Surgical History:  Procedure Laterality Date  . COLONOSCOPY    . DILATION AND CURETTAGE OF UTERUS    . JOINT REPLACEMENT    . TONSILLECTOMY    . 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   Procedure: TOTAL HIP ARTHROPLASTY ANTERIOR APPROACH;  Surgeon: Dorna Leitz, MD;  Location: [place];  Service: Orthopedics;  Laterality: Left;    There were no vitals filed for this visit.      Subjective Assessment - 12/11/16 1028    Subjective I feel stronger, Ive had a good week.    Currently in Pain? Yes   Pain Score 2    Pain Location Hip   Pain Orientation Left;Proximal              [facility] Adult PT Treatment/Exercise - 12/11/16 1033      Lumbar Exercises: Machines for Strengthening   Other Lumbar Machine Exercise Pilates Tower        Pilates Tower for LE/Core strength, postural strength, lumbopelvic disassociation and core control.  Exercises  included: Supine Leg [place] yellow springs  Single leg arcs in neutral, hip ER, circles in each direction  Double leg Arcs and squats (Frog) mod tactile and verbal cueing to maintain neutral and avoid posterior pelvis tilt Sidelying Leg [place] yellow side series: flexion, ext. And adduction/abduction, small circles on LLE x 10 each   [place] yellow Arcs in flexion, abduction, added a leg lift x 5 each leg (SLR)   Push through bar for spinal articulation and stretching post session.  Able to straddle tower with min L hip discomfort.        PT Education - 12/11/16 1300    Education provided Yes   Education Details neutral pelvis, Pilates Tower   Person(s) Educated Patient   Methods Explanation;Tactile cues;Verbal cues   Comprehension Verbalized understanding;Tactile cues required          PT Short Term Goals - 12/09/16 1142      PT SHORT TERM GOAL #1   Title Patient will report < 2/10 pain in the L hip with most everyday activities.    Status Partially Met     PT SHORT TERM GOAL #2   Title Patient will be independent with initial HEP for core stability    Status Achieved     PT  SHORT TERM GOAL #3   Title Pt will be able to tolerate sitting for up to 30 min with only min pain.    Status Achieved           PT Long Term Goals - 12/09/16 1143      PT LONG TERM GOAL #1   Title Patient will demonstrate proper technique with home program for core and hip strengthening    Status On-going     PT LONG TERM GOAL #2   Title Patient will ambulate 2 miles without increased pain in order to perfrom IADL's and walk for exercises    Status Partially Met     PT LONG TERM GOAL #3   Title Patient will stand for 30-45 min with min increased pain in order to perform IADL's    Baseline improving   Status On-going     PT LONG TERM GOAL #4   Title FOTO score will improve to <25% impaired to demo functional improvement.    Status Unable to assess               Plan -  12/11/16 1302    Clinical Impression Statement Used Pilates Tower today.  She continues to have difficulty disassociating pelvis and femur.  Pt with less pain after and reports "feeling great".     PT Next Visit Plan  mat level HEP for hip and core strength, progress to standing, Reformer ex.    PT Home Exercise Plan bridge, clam, HHPT HEP , hip abd in SL and standing , sit to stand with band , ball ex    Consulted and Agree with Plan of Care Patient      Patient will benefit from skilled therapeutic intervention in order to improve the following deficits and impairments:  Abnormal gait, Decreased mobility, Increased muscle spasms, Decreased strength, Difficulty walking, Increased fascial restricitons, Decreased knowledge of use of DME, Decreased activity tolerance, Decreased range of motion, Impaired flexibility, Pain  Visit Diagnosis: Muscle weakness (generalized)  Chronic right-sided low back pain with right-sided sciatica  Stiffness of left hip, not elsewhere classified  Muscle spasm of back     Problem List Patient Active Problem List   Diagnosis Date Noted  . Primary osteoarthritis of left hip 09/29/2016  . Urinary incontinence 06/25/2016  . Lumbar radiculopathy 06/18/2016  . 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 Abnormality of gait 02/02/2013  . Nonallopathic lesion of lumbar region 04/14/2011  . OSTEOARTHRITIS, HIP, LEFT 11/14/2009  . METATARSALGIA 11/14/2009  . BUNION, RIGHT FOOT 11/14/2009  . HIP PAIN, LEFT 10/02/2009  . Pain in joint, lower leg 10/02/2009  . LOW BACK PAIN, CHRONIC 10/02/2009    [name],[name] 12/11/2016, 1:08 PM  [facility] [address] [place], [place], [address] Phone: [phone]   Fax:  [phone]  Name: CAMYA HAYDON MRN: [id number] Date of Birth: 12-Jul-1941  [name], PT 12/11/16 1:09 PM Phone: [phone] Fax: [phone]

## 2016-12-15 ENCOUNTER — Ambulatory Visit: Payer: PPO | Admitting: Physical Therapy

## 2016-12-15 DIAGNOSIS — M5441 Lumbago with sciatica, right side: Secondary | ICD-10-CM

## 2016-12-15 DIAGNOSIS — M25652 Stiffness of left hip, not elsewhere classified: Secondary | ICD-10-CM

## 2016-12-15 DIAGNOSIS — G8929 Other chronic pain: Secondary | ICD-10-CM

## 2016-12-15 DIAGNOSIS — M6281 Muscle weakness (generalized): Secondary | ICD-10-CM

## 2016-12-15 DIAGNOSIS — M62830 Muscle spasm of back: Secondary | ICD-10-CM

## 2016-12-15 NOTE — Therapy (Signed)
[place], [place], [address] Phone: [phone]   Fax:  [phone]  Physical Therapy Treatment  Patient Details  Name: Shelia [name] MRN: [id number] Date of Birth: 10-08-1940 Referring Provider: Dr. Berenice Primas   Encounter Date: 12/15/2016      PT End of Session - 12/15/16 1049    Visit Number 10   Number of Visits 16   Date for PT Re-Evaluation 12/31/16   Authorization Type [employer] medicare    Authorization Time Period KX MOD at 15 visits    PT Start Time 1020   PT Stop Time 1107   PT Time Calculation (min) 47 min   Activity Tolerance Patient tolerated treatment well   Behavior During Therapy [facility] for tasks assessed/performed      Past Medical History:  Diagnosis Date  . Anxiety   . Arthritis    "left knee; left hip; lower back; mild to moderate in right hip" (09/30/2016)  . Chronic lower back pain   . Gastritis   . Heart palpitations   . Hypercholesteremia   . Seasonal allergies     Past Surgical History:  Procedure Laterality Date  . COLONOSCOPY    . DILATION AND CURETTAGE OF UTERUS    . JOINT REPLACEMENT    . TONSILLECTOMY    . 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   Procedure: TOTAL HIP ARTHROPLASTY ANTERIOR APPROACH;  Surgeon: Dorna Leitz, MD;  Location: [place];  Service: Orthopedics;  Laterality: Left;    There were no vitals filed for this visit.      Subjective Assessment - 12/15/16 1031    Subjective Still feeling stronger, pain is in LLE to knee, mostly in AM.  1/10.    Currently in Pain? Yes   Pain Score 1    Pain Location Hip   Pain Orientation Left   Pain Descriptors / Indicators Sore   Pain Type Chronic pain   Pain Onset More than a month ago   Pain Frequency Intermittent   Aggravating Factors  overdoing it    Pain Relieving Factors stretch, heat, walking a bit    Multiple Pain Sites No            [facility] PT  Assessment - 12/15/16 1027      Observation/Other Assessments   Focus on Therapeutic Outcomes (FOTO)  34%             [facility] Adult PT Treatment/Exercise - 12/15/16 1028      Lumbar Exercises: Aerobic   Elliptical 7 min, level 1 resist, level 5 ramp      Lumbar Exercises: Machines for Strengthening   Other Lumbar Machine Exercise Pilates Reformer     Knee/Hip Exercises: Standing   Other Standing Knee Exercises worked dynamic balance and coordination, core control in standing with resistance bands: oblique twist in tandem, single leg balance with row and forward lunge with extension      Pilates Reformer used for LE/core strength, postural strength, lumbopelvic disassociation and core control.  Exercises included: Footwork 2 Red 1 blue double, single on 2 reds.  Cued for inner thigh and abdominal engagement, level pelvis.   Bridging x 10  4 springs added push out as able for 5 reps.   Stretch hamstrings blue band 30 sec x 3          PT Education - 12/15/16 1259    Education provided Yes   Education Details reinforced neutral,  Pilates, FOTO results    Person(s) Educated Patient   Methods Explanation   Comprehension Verbalized understanding;Need further instruction          PT Short Term Goals - 12/09/16 1142      PT SHORT TERM GOAL #1   Title Patient will report < 2/10 pain in the L hip with most everyday activities.    Status Partially Met     PT SHORT TERM GOAL #2   Title Patient will be independent with initial HEP for core stability    Status Achieved     PT SHORT TERM GOAL #3   Title Pt will be able to tolerate sitting for up to 30 min with only min pain.    Status Achieved           PT Long Term Goals - 01-03-17 1301      PT LONG TERM GOAL #1   Title Patient will demonstrate proper technique with home program for core and hip strengthening    Status On-going     PT LONG TERM GOAL #2   Title Patient will ambulate 2 miles without increased pain in  order to perfrom IADL's and walk for exercises    Baseline pain after the walk, but not during   Status Partially Met     PT LONG TERM GOAL #3   Title Patient will stand for 30-45 min with min increased pain in order to perform IADL's    Baseline improving   Status Partially Met     PT LONG TERM GOAL #4   Title FOTO score will improve to <25% impaired to demo functional improvement.    Baseline 34%   Status On-going               Plan - 2017/01/03 1259    Clinical Impression Statement Pt continues to need reinforcement of neutral, stabilizing pelvis for LE work.  Doing well overall, can walk 2 miles or more with only min pain.    PT Next Visit Plan  mat level HEP for hip and core strength, progress to standing, Reformer ex.    PT Home Exercise Plan bridge, clam, HHPT HEP , hip abd in SL and standing , sit to stand with band , ball ex    Consulted and Agree with Plan of Care Patient      Patient will benefit from skilled therapeutic intervention in order to improve the following deficits and impairments:  Abnormal gait, Decreased mobility, Increased muscle spasms, Decreased strength, Difficulty walking, Increased fascial restricitons, Decreased knowledge of use of DME, Decreased activity tolerance, Decreased range of motion, Impaired flexibility, Pain  Visit Diagnosis: Muscle weakness (generalized)  Chronic right-sided low back pain with right-sided sciatica  Muscle spasm of back  Stiffness of left hip, not elsewhere classified       G-Codes - 2017-01-03 1302    Functional Assessment Tool Used (Outpatient Only) FOTO    Functional Limitation Changing and maintaining body position   Changing and Maintaining Body Position Current Status (K5397) At least 20 percent but less than 40 percent impaired, limited or restricted   Changing and Maintaining Body Position Goal Status (Q7341) At least 20 percent but less than 40 percent impaired, limited or restricted      Problem  List Patient Active Problem List   Diagnosis Date Noted  . Primary osteoarthritis of left hip 09/29/2016  . Urinary incontinence 06/25/2016  . Lumbar radiculopathy 06/18/2016  . 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  Abnormality of gait 02/02/2013  . Nonallopathic lesion of lumbar region 04/14/2011  . OSTEOARTHRITIS, HIP, LEFT 11/14/2009  . METATARSALGIA 11/14/2009  . BUNION, RIGHT FOOT 11/14/2009  . HIP PAIN, LEFT 10/02/2009  . Pain in joint, lower leg 10/02/2009  . LOW BACK PAIN, CHRONIC 10/02/2009    Ignazio Kincaid 12/15/2016, 1:04 PM  [facility] [address] [place], [place], [address] Phone: [phone]   Fax:  [phone]  Name: CLARISE CHACKO MRN: [id number] Date of Birth: Jan 06, 1941  [name], PT 12/15/16 1:04 PM Phone: [phone] Fax: [phone]

## 2016-12-18 ENCOUNTER — Encounter: Payer: Self-pay | Admitting: Physical Therapy

## 2016-12-18 ENCOUNTER — Ambulatory Visit: Payer: PPO | Admitting: Physical Therapy

## 2016-12-18 DIAGNOSIS — M6281 Muscle weakness (generalized): Secondary | ICD-10-CM | POA: Diagnosis not present

## 2016-12-18 DIAGNOSIS — M62830 Muscle spasm of back: Secondary | ICD-10-CM

## 2016-12-18 DIAGNOSIS — M25652 Stiffness of left hip, not elsewhere classified: Secondary | ICD-10-CM

## 2016-12-18 DIAGNOSIS — M5441 Lumbago with sciatica, right side: Secondary | ICD-10-CM

## 2016-12-18 DIAGNOSIS — G8929 Other chronic pain: Secondary | ICD-10-CM

## 2016-12-18 NOTE — Therapy (Signed)
[place], [place], [address] Phone: [phone]   Fax:  [phone]  Physical Therapy Treatment  Patient Details  Name: Shelia [name] MRN: [id number] Date of Birth: 03-19-1941 Referring Provider: Dr. Berenice Primas   Encounter Date: 12/18/2016      PT End of Session - 12/18/16 1320    Visit Number 11   Number of Visits 13   Date for PT Re-Evaluation 12/31/16   Authorization Type healthstream medicare    Authorization Time Period KX MOD at 15 visits    PT Start Time 1020   PT Stop Time 1102   PT Time Calculation (min) 42 min   Activity Tolerance Patient tolerated treatment well   Behavior During Therapy [facility] for tasks assessed/performed      Past Medical History:  Diagnosis Date  . Anxiety   . Arthritis    "left knee; left hip; lower back; mild to moderate in right hip" (09/30/2016)  . Chronic lower back pain   . Gastritis   . Heart palpitations   . Hypercholesteremia   . Seasonal allergies     Past Surgical History:  Procedure Laterality Date  . COLONOSCOPY    . DILATION AND CURETTAGE OF UTERUS    . JOINT REPLACEMENT    . TONSILLECTOMY    . 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   Procedure: TOTAL HIP ARTHROPLASTY ANTERIOR APPROACH;  Surgeon: Dorna Leitz, MD;  Location: [place];  Service: Orthopedics;  Laterality: Left;    There were no vitals filed for this visit.      Subjective Assessment - 12/18/16 1024    Subjective Pt is anxious about having to take her friend to the eye doc today, her friend who is in a wheelchair.  Wants to get a better routine for her strengthening program.    Currently in Pain? No/denies            [facility] Adult PT Treatment/Exercise - 12/18/16 1056      Self-Care   Other Self-Care Comments  neutral, pelvis/landmark   HEP review, scheduling anf Pilates classes in community      Lumbar Exercises: Stretches   Active  Hamstring Stretch 2 reps;30 seconds   Single Knee to Chest Stretch 2 reps;30 seconds   Piriformis Stretch 3 reps;30 seconds   Piriformis Stretch Limitations and hip ER      Lumbar Exercises: Supine   Clam 20 reps   Bent Knee Raise 20 reps   Bent Knee Raise Limitations with and without band (red)    Bridge 10 reps   Bridge Limitations bridge and clam with blue loop    Other Supine Lumbar Exercises bridge and march with band x 5 , unable to keep pelvis level      Lumbar Exercises: Sidelying   Clam 20 reps   Hip Abduction 20 reps     Knee/Hip Exercises: Aerobic   Elliptical 10 min independently.      Knee/Hip Exercises: Standing   Hip Abduction Stengthening;Both;10 reps     Knee/Hip Exercises: Seated   Sit to Sand 10 reps;without UE support  worked on [employer], used band for feedback                  PT Short Term Goals - 12/09/16 1142      PT SHORT TERM GOAL #1   Title Patient will report < 2/10 pain in the L hip with most everyday  activities.    Status Partially Met     PT SHORT TERM GOAL #2   Title Patient will be independent with initial HEP for core stability    Status Achieved     PT SHORT TERM GOAL #3   Title Pt will be able to tolerate sitting for up to 30 min with only min pain.    Status Achieved           PT Long Term Goals - 12/15/16 1301      PT LONG TERM GOAL #1   Title Patient will demonstrate proper technique with home program for core and hip strengthening    Status On-going     PT LONG TERM GOAL #2   Title Patient will ambulate 2 miles without increased pain in order to perfrom IADL's and walk for exercises    Baseline pain after the walk, but not during   Status Partially Met     PT LONG TERM GOAL #3   Title Patient will stand for 30-45 min with min increased pain in order to perform IADL's    Baseline improving   Status Partially Met     PT LONG TERM GOAL #4   Title FOTO score will improve to <25% impaired to demo  functional improvement.    Baseline 34%   Status On-going               Plan - 12/18/16 1320    Clinical Impression Statement Per patient request, we streamlined HEP and provided more structure to what she needs to be dong at home (related to exercise).  She was not limited by pain at all today.  Able to do ellipitical independently after session for more exercise.     PT Next Visit Plan  mat level HEP for hip and core strength, progress to standing, Reformer ex.    PT Home Exercise Plan bridge, clam, HHPT HEP , hip abd in SL and standing , sit to stand with band , ball ex    Consulted and Agree with Plan of Care Patient      Patient will benefit from skilled therapeutic intervention in order to improve the following deficits and impairments:  Abnormal gait, Decreased mobility, Increased muscle spasms, Decreased strength, Difficulty walking, Increased fascial restricitons, Decreased knowledge of use of DME, Decreased activity tolerance, Decreased range of motion, Impaired flexibility, Pain  Visit Diagnosis: Muscle weakness (generalized)  Chronic right-sided low back pain with right-sided sciatica  Muscle spasm of back  Stiffness of left hip, not elsewhere classified     Problem List Patient Active Problem List   Diagnosis Date Noted  . Primary osteoarthritis of left hip 09/29/2016  . Urinary incontinence 06/25/2016  . Lumbar radiculopathy 06/18/2016  . 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 Abnormality of gait 02/02/2013  . Nonallopathic lesion of lumbar region 04/14/2011  . OSTEOARTHRITIS, HIP, LEFT 11/14/2009  . METATARSALGIA 11/14/2009  . BUNION, RIGHT FOOT 11/14/2009  . HIP PAIN, LEFT 10/02/2009  . Pain in joint, lower leg 10/02/2009  . LOW BACK PAIN, CHRONIC 10/02/2009    [name],[name] 12/18/2016, 1:22 PM  [facility] [address] [place], [place], [address] Phone: [phone]   Fax:  [phone]  Name:  Shelia [name] MRN: [id number] Date of Birth: 1941-01-26  [name], PT 12/18/16 1:23 PM Phone: [phone] Fax: [phone]

## 2016-12-23 ENCOUNTER — Encounter: Payer: Self-pay | Admitting: Physical Therapy

## 2016-12-23 ENCOUNTER — Ambulatory Visit: Payer: PPO | Admitting: Physical Therapy

## 2016-12-23 DIAGNOSIS — M25652 Stiffness of left hip, not elsewhere classified: Secondary | ICD-10-CM

## 2016-12-23 DIAGNOSIS — M6281 Muscle weakness (generalized): Secondary | ICD-10-CM | POA: Diagnosis not present

## 2016-12-23 DIAGNOSIS — M5441 Lumbago with sciatica, right side: Secondary | ICD-10-CM

## 2016-12-23 DIAGNOSIS — G8929 Other chronic pain: Secondary | ICD-10-CM

## 2016-12-23 DIAGNOSIS — M62830 Muscle spasm of back: Secondary | ICD-10-CM

## 2016-12-23 NOTE — Therapy (Signed)
[place], [place], [address] Phone: [phone]   Fax:  [phone]  Physical Therapy Treatment  Patient Details  Name: Shelia [name] MRN: [id number] Date of Birth: Dec 19, 1940 Referring Provider: Dr. Berenice Primas   Encounter Date: 12/23/2016      PT End of Session - 12/23/16 0936    Visit Number 12   Number of Visits 13   Date for PT Re-Evaluation 12/31/16   Authorization Type healthstream medicare    Authorization Time Period KX MOD at 15 visits    PT Start Time 0936   PT Stop Time 1015   PT Time Calculation (min) 39 min   Activity Tolerance Patient tolerated treatment well   Behavior During Therapy [facility] for tasks assessed/performed      Past Medical History:  Diagnosis Date  . Anxiety   . Arthritis    "left knee; left hip; lower back; mild to moderate in right hip" (09/30/2016)  . Chronic lower back pain   . Gastritis   . Heart palpitations   . Hypercholesteremia   . Seasonal allergies     Past Surgical History:  Procedure Laterality Date  . COLONOSCOPY    . DILATION AND CURETTAGE OF UTERUS    . JOINT REPLACEMENT    . TONSILLECTOMY    . 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   Procedure: TOTAL HIP ARTHROPLASTY ANTERIOR APPROACH;  Surgeon: Dorna Leitz, MD;  Location: [place];  Service: Orthopedics;  Laterality: Left;    There were no vitals filed for this visit.      Subjective Assessment - 12/23/16 0958    Subjective Swam this AM.  A bit worried about going to [place] and navigating, walking.     Currently in Pain? No/denies             [facility] Adult PT Treatment/Exercise - 12/23/16 1012      High Level Balance   High Level Balance Activities Other (comment)   High Level Balance Comments SLS, tandema and lunge with eyes closed, head turns      Self-Care   Other Self-Care Comments  HEP, balance, routine and cues for alignment, balance       Lumbar Exercises: Supine   Heel Slides 20 reps   Heel Slides Limitations ball    Bridge 10 reps   Bridge Limitations 2 sets with ball and with/without UE for support    Other Supine Lumbar Exercises LEs on ball, SLR x 10 each leg      Knee/Hip Exercises: Standing   Other Standing Knee Exercises standing 4 way hip green band in doorway, for balance and strength  and UE assist                 PT Education - 12/23/16 1321    Education provided Yes   Education Details balance and simple HEP    Person(s) Educated Patient   Methods Explanation   Comprehension Verbalized understanding          PT Short Term Goals - 12/23/16 1322      PT SHORT TERM GOAL #1   Title Patient will report < 2/10 pain in the L hip with most everyday activities.    Status Partially Met     PT SHORT TERM GOAL #2   Title Patient will be independent with initial HEP for core stability    Status Achieved     PT SHORT TERM GOAL #  3   Title Pt will be able to tolerate sitting for up to 30 min with only min pain.    Status Achieved           PT Long Term Goals - 12/23/16 1323      PT LONG TERM GOAL #1   Title Patient will demonstrate proper technique with home program for core and hip strengthening    Status On-going     PT LONG TERM GOAL #2   Title Patient will ambulate 2 miles without increased pain in order to perfrom IADL's and walk for exercises    Status Partially Met     PT LONG TERM GOAL #3   Title Patient will stand for 30-45 min with min increased pain in order to perform IADL's    Status Partially Met     PT LONG TERM GOAL #4   Title FOTO score will improve to <25% impaired to demo functional improvement.    Status Unable to assess               Plan - 12/23/16 1321    Clinical Impression Statement Pt given balance HEP to sub in as she progresses.  Unable to use band effectively for certain planes of motion.  Able to stand on each leg and in tandem for up to 30  sec bilaterally.  Renew after next week.    PT Next Visit Plan  mat level HEP for hip and core strength, progress to standing, Reformer ex.    PT Home Exercise Plan bridge, clam, HHPT HEP , hip abd in SL and standing , sit to stand with band , ball ex    Consulted and Agree with Plan of Care Patient      Patient will benefit from skilled therapeutic intervention in order to improve the following deficits and impairments:  Abnormal gait, Decreased mobility, Increased muscle spasms, Decreased strength, Difficulty walking, Increased fascial restricitons, Decreased knowledge of use of DME, Decreased activity tolerance, Decreased range of motion, Impaired flexibility, Pain  Visit Diagnosis: Muscle weakness (generalized)  Chronic right-sided low back pain with right-sided sciatica  Muscle spasm of back  Stiffness of left hip, not elsewhere classified     Problem List Patient Active Problem List   Diagnosis Date Noted  . Primary osteoarthritis of left hip 09/29/2016  . Urinary incontinence 06/25/2016  . Lumbar radiculopathy 06/18/2016  . 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 Abnormality of gait 02/02/2013  . Nonallopathic lesion of lumbar region 04/14/2011  . OSTEOARTHRITIS, HIP, LEFT 11/14/2009  . METATARSALGIA 11/14/2009  . BUNION, RIGHT FOOT 11/14/2009  . HIP PAIN, LEFT 10/02/2009  . Pain in joint, lower leg 10/02/2009  . LOW BACK PAIN, CHRONIC 10/02/2009    [name],[name] 12/23/2016, 1:25 PM  [facility] [address] [place], [place], [address] Phone: [phone]   Fax:  [phone]  Name: MATTALYN ANDEREGG MRN: [id number] Date of Birth: [birth year]  [name], PT 12/23/16 1:26 PM Phone: [phone] Fax: [phone]

## 2016-12-24 ENCOUNTER — Other Ambulatory Visit: Payer: Self-pay | Admitting: Family Medicine

## 2016-12-24 DIAGNOSIS — Z1231 Encounter for screening mammogram for malignant neoplasm of breast: Secondary | ICD-10-CM

## 2016-12-29 ENCOUNTER — Encounter (HOSPITAL_COMMUNITY): Payer: Self-pay | Admitting: Orthopedic Surgery

## 2016-12-29 NOTE — Addendum Note (Signed)
Addendum  created 12/29/16 1206 by Oleta Mouse, MD   Sign clinical note

## 2016-12-30 ENCOUNTER — Ambulatory Visit: Payer: PPO | Attending: Orthopedic Surgery | Admitting: Physical Therapy

## 2016-12-30 DIAGNOSIS — M6281 Muscle weakness (generalized): Secondary | ICD-10-CM | POA: Diagnosis not present

## 2016-12-30 DIAGNOSIS — M62830 Muscle spasm of back: Secondary | ICD-10-CM

## 2016-12-30 DIAGNOSIS — M25652 Stiffness of left hip, not elsewhere classified: Secondary | ICD-10-CM

## 2016-12-30 DIAGNOSIS — M5441 Lumbago with sciatica, right side: Secondary | ICD-10-CM | POA: Diagnosis not present

## 2016-12-30 DIAGNOSIS — G8929 Other chronic pain: Secondary | ICD-10-CM

## 2016-12-30 NOTE — Patient Instructions (Signed)
Hip Flexors (Supine)    Lie with both legs bent over edge of firm surface. To stretch left hip, bring opposite knee to chest. Apply downward pressure to leg hanging over edge. Do not allow hips to roll up. Do not let knees change position. Hold __30__ seconds. Repeat __3__ times. Do ___1 sessions per day. CAUTION: Stretch should be gentle, steady and slow.  Copyright  VHI. All rights reserved.

## 2016-12-30 NOTE — Therapy (Signed)
[place], [place], [address] Phone: [phone]   Fax:  [phone]  Physical Therapy Treatment and Renewed  Patient Details  Name: Shelia [name] MRN: [id number] Date of Birth: 12/11/1940 Referring Provider: Dr. Berenice Primas   Encounter Date: 12/30/2016      PT End of Session - 12/30/16 1001    Visit Number 13   Number of Visits 21   Date for PT Re-Evaluation 02/10/17   Authorization Type healthstream medicare    Authorization Time Period KX MOD at 15 visits    PT Start Time 0942   PT Stop Time 1016   PT Time Calculation (min) 34 min   Activity Tolerance Patient tolerated treatment well   Behavior During Therapy [facility] for tasks assessed/performed      Past Medical History:  Diagnosis Date  . Anxiety   . Arthritis    "left knee; left hip; lower back; mild to moderate in right hip" (09/30/2016)  . Chronic lower back pain   . Gastritis   . Heart palpitations   . Hypercholesteremia   . Seasonal allergies     Past Surgical History:  Procedure Laterality Date  . COLONOSCOPY    . DILATION AND CURETTAGE OF UTERUS    . JOINT REPLACEMENT    . TONSILLECTOMY    . 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   Procedure: TOTAL HIP ARTHROPLASTY ANTERIOR APPROACH;  Surgeon: Dorna Leitz, MD;  Location: [place];  Service: Orthopedics;  Laterality: Left;    There were no vitals filed for this visit.      Subjective Assessment - 12/30/16 0945    Subjective Had a busy weekend in [place] and was able to walk as needed.  Didnt over do it, back is pretty good.  Aches a little    Currently in Pain? Yes   Pain Score 1    Pain Location Hip   Pain Score 2   Pain Location Back            [facility] PT Assessment - 12/30/16 0949      Observation/Other Assessments   Focus on Therapeutic Outcomes (FOTO)  40%     Strength   Right Hip Flexion 4+/5   Right Hip Extension 3+/5   Right Hip External Rotation  5/5   Right Hip Internal Rotation 4+/5   Right Hip ABduction 4+/5   Left Hip Flexion 4/5   Left Hip Extension 3/5   Left Hip External Rotation 4/5   Left Hip Internal Rotation 4/5   Left Hip ABduction 4/5                     [facility] Adult PT Treatment/Exercise - 12/30/16 0952      Lumbar Exercises: Sidelying   Clam 20 reps   Clam Limitations blue band    Hip Abduction 20 reps   Other Sidelying Lumbar Exercises sidekick series x 10 each leg      Knee/Hip Exercises: Stretches   Hip Flexor Stretch Both;3 reps;30 seconds   Hip Flexor Stretch Limitations with manual overpressure, knee flexion      Knee/Hip Exercises: Aerobic   Elliptical 10 min independently.               PT Short Term Goals - 12/23/16 1322      PT SHORT TERM GOAL #1   Title Patient will report < 2/10 pain in the L hip with most  everyday activities.    Status Partially Met     PT SHORT TERM GOAL #2   Title Patient will be independent with initial HEP for core stability    Status Achieved     PT SHORT TERM GOAL #3   Title Pt will be able to tolerate sitting for up to 30 min with only min pain.    Status Achieved           PT Long Term Goals - 01/02/17 1247      PT LONG TERM GOAL #1   Title Patient will demonstrate proper technique with home program for core and hip strengthening    Status On-going     PT LONG TERM GOAL #2   Title Patient will ambulate 2 miles without increased pain in order to perfrom IADL's and walk for exercises    Status Partially Met     PT LONG TERM GOAL #3   Title Patient will stand for 30-45 min with min increased pain in order to perform IADL's    Baseline improving   Status Partially Met     PT LONG TERM GOAL #4   Title FOTO score will improve to <25% impaired to demo functional improvement.    Status On-going               Plan - 01/02/17 1245    Clinical Impression Statement Pt came in late today, renewed  for more visits to work on hip strength and balance.  Lt. hip is tight in ER as well as tightness in Lt ant hip. Will be out of town for a couple weeks.  Goals in progress. Will focus more on manual and hip AROM.    PT Frequency 2x / week   PT Duration 6 weeks  8 more visits total.    PT Treatment/Interventions ADLs/Self Care Home Management;Cryotherapy;Electrical Stimulation;Iontophoresis 74m/ml Dexamethasone;Moist Heat;Ultrasound;Gait training;Therapeutic activities;Therapeutic exercise;Cognitive remediation;Patient/family education;Manual techniques;Dry needling;Splinting;Taping;Passive range of motion;Traction;Functional mobility training;Neuromuscular re-education;Balance training;Other (comment)   PT Next Visit Plan  mat level HEP for hip and core strength, progress to standing, Reformer ex.    PT Home Exercise Plan bridge, clam, HHPT HEP , hip abd in SL and standing , sit to stand with band , ball ex    Consulted and Agree with Plan of Care Patient      Patient will benefit from skilled therapeutic intervention in order to improve the following deficits and impairments:  Abnormal gait, Decreased mobility, Increased muscle spasms, Decreased strength, Difficulty walking, Increased fascial restricitons, Decreased knowledge of use of DME, Decreased activity tolerance, Decreased range of motion, Impaired flexibility, Pain  Visit Diagnosis: Muscle weakness (generalized)  Chronic right-sided low back pain with right-sided sciatica  Muscle spasm of back  Stiffness of left hip, not elsewhere classified       G-Codes - 006/08/181125    Functional Assessment Tool Used (Outpatient Only) FOTO , clinical judgement    Functional Limitation Changing and maintaining body position   Changing and Maintaining Body Position Current Status ((N6295 At least 20 percent but less than 40 percent impaired, limited or restricted   Changing and Maintaining Body Position Goal Status ((M8413 At least 20 percent  but less than 40 percent impaired, limited or restricted      Problem List Patient Active Problem List   Diagnosis Date Noted  . Primary osteoarthritis of left hip 09/29/2016  . Urinary incontinence 06/25/2016  . Lumbar radiculopathy 06/18/2016  . 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  Abnormality of gait 02/02/2013  . Nonallopathic lesion of lumbar region 04/14/2011  . OSTEOARTHRITIS, HIP, LEFT 11/14/2009  . METATARSALGIA 11/14/2009  . BUNION, RIGHT FOOT 11/14/2009  . HIP PAIN, LEFT 10/02/2009  . Pain in joint, lower leg 10/02/2009  . LOW BACK PAIN, CHRONIC 10/02/2009    [name],[name] 12/30/2016, 12:52 PM  [facility] [address] [place], [place], [address] Phone: [phone]   Fax:  [phone]  Name: Shelia [name] MRN: [id number] Date of Birth: 14-Sep-1940  [name], PT 12/30/16 12:52 PM Phone: [phone] Fax: [phone]

## 2017-01-06 ENCOUNTER — Encounter: Payer: Self-pay | Admitting: Physical Therapy

## 2017-01-06 ENCOUNTER — Ambulatory Visit: Payer: PPO | Admitting: Physical Therapy

## 2017-01-06 DIAGNOSIS — G8929 Other chronic pain: Secondary | ICD-10-CM

## 2017-01-06 DIAGNOSIS — M62830 Muscle spasm of back: Secondary | ICD-10-CM

## 2017-01-06 DIAGNOSIS — M6281 Muscle weakness (generalized): Secondary | ICD-10-CM

## 2017-01-06 DIAGNOSIS — M5441 Lumbago with sciatica, right side: Secondary | ICD-10-CM

## 2017-01-06 DIAGNOSIS — M25652 Stiffness of left hip, not elsewhere classified: Secondary | ICD-10-CM

## 2017-01-06 NOTE — Therapy (Signed)
[place], [place], [address] Phone: [phone]   Fax:  [phone]  Physical Therapy Treatment  Patient Details  Name: Shelia [name] MRN: [id number] Date of Birth: [birth year] Referring Provider: Dr. Berenice Primas   Encounter Date: 01/06/2017      PT End of Session - 01/06/17 1558    Visit Number 14   Number of Visits 21   Date for PT Re-Evaluation 02/10/17   Authorization Type healthstream medicare    Authorization Time Period KX MOD at 15 visits    PT Start Time 1547   PT Stop Time 1640   PT Time Calculation (min) 53 min   Activity Tolerance Patient tolerated treatment well   Behavior During Therapy [facility] for tasks assessed/performed      Past Medical History:  Diagnosis Date  . Anxiety   . Arthritis    "left knee; left hip; lower back; mild to moderate in right hip" (09/30/2016)  . Chronic lower back pain   . Gastritis   . Heart palpitations   . Hypercholesteremia   . Seasonal allergies     Past Surgical History:  Procedure Laterality Date  . COLONOSCOPY    . DILATION AND CURETTAGE OF UTERUS    . JOINT REPLACEMENT    . TONSILLECTOMY    . 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   Procedure: TOTAL HIP ARTHROPLASTY ANTERIOR APPROACH;  Surgeon: Dorna Leitz, MD;  Location: [place];  Service: Orthopedics;  Laterality: Left;    There were no vitals filed for this visit.      Subjective Assessment - 01/06/17 1550    Subjective [facility] and [facility].  I feel good.     Currently in Pain? No/denies             [facility] Adult PT Treatment/Exercise - 01/06/17 1559      Lumbar Exercises: Prone   Other Prone Lumbar Exercises L LE hip ext knee bent      Lumbar Exercises: Quadruped   Opposite Arm/Leg Raise Right arm/Left leg;Left arm/Right leg;10 reps   Other Quadruped Lumbar Exercises childs pose limited in L hip flexion       Knee/Hip Exercises: Stretches   Hip Flexor Stretch Both;3 reps;20 seconds   Hip Flexor Stretch Limitations 1/2 kneeling stretching and then upper trunk rotation x 3      Knee/Hip Exercises: Supine   Other Supine Knee/Hip Exercises 4 way SLR each LE with 2.5 lbs      Moist Heat Therapy   Number Minutes Moist Heat 10 Minutes   Moist Heat Location Lumbar Spine     Manual Therapy   Manual Therapy Passive ROM   Passive ROM prone hip L ER                 PT Education - 01/06/17 1557    Education provided Yes   Education Details SLR x 4    Person(s) Educated Patient   Methods Explanation   Comprehension Verbalized understanding          PT Short Term Goals - 12/23/16 1322      PT SHORT TERM GOAL #1   Title Patient will report < 2/10 pain in the L hip with most everyday activities.    Status Partially Met     PT SHORT TERM GOAL #2   Title Patient will be independent with initial HEP for core stability  Status Achieved     PT SHORT TERM GOAL #3   Title Pt will be able to tolerate sitting for up to 30 min with only min pain.    Status Achieved           PT Long Term Goals - 12/30/16 1247      PT LONG TERM GOAL #1   Title Patient will demonstrate proper technique with home program for core and hip strengthening    Status On-going     PT LONG TERM GOAL #2   Title Patient will ambulate 2 miles without increased pain in order to perfrom IADL's and walk for exercises    Status Partially Met     PT LONG TERM GOAL #3   Title Patient will stand for 30-45 min with min increased pain in order to perform IADL's    Baseline improving   Status Partially Met     PT LONG TERM GOAL #4   Title FOTO score will improve to <25% impaired to demo functional improvement.    Status On-going               Plan - 01/06/17 1631    Clinical Impression Statement Worked on hip flexibility and stregnth on mat.  Improving overall. Tight in ER and ant hip as well, 1/2  kneeling worked well to address multiple deficits.    PT Next Visit Plan  mat level HEP for hip and core strength, progress to standing, Reformer ex.    PT Home Exercise Plan bridge, clam, HHPT HEP , hip abd in SL and standing , sit to stand with band , ball ex    Consulted and Agree with Plan of Care Patient      Patient will benefit from skilled therapeutic intervention in order to improve the following deficits and impairments:  Abnormal gait, Decreased mobility, Increased muscle spasms, Decreased strength, Difficulty walking, Increased fascial restricitons, Decreased knowledge of use of DME, Decreased activity tolerance, Decreased range of motion, Impaired flexibility, Pain  Visit Diagnosis: Muscle weakness (generalized)  Chronic right-sided low back pain with right-sided sciatica  Muscle spasm of back  Stiffness of left hip, not elsewhere classified     Problem List Patient Active Problem List   Diagnosis Date Noted  . Primary osteoarthritis of left hip 09/29/2016  . Urinary incontinence 06/25/2016  . Lumbar radiculopathy 06/18/2016  . 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 Abnormality of gait 02/02/2013  . Nonallopathic lesion of lumbar region 04/14/2011  . OSTEOARTHRITIS, HIP, LEFT 11/14/2009  . METATARSALGIA 11/14/2009  . BUNION, RIGHT FOOT 11/14/2009  . HIP PAIN, LEFT 10/02/2009  . Pain in joint, lower leg 10/02/2009  . LOW BACK PAIN, CHRONIC 10/02/2009    [name],[name] 01/06/2017, 4:34 PM  [facility] [address] [place], [place], [address] Phone: [phone]   Fax:  [phone]  Name: Shelia [name] MRN: [id number] Date of Birth: September 30, 1940  [name], PT 01/06/17 4:35 PM Phone: [phone] Fax: [phone]

## 2017-01-07 ENCOUNTER — Ambulatory Visit
Admission: RE | Admit: 2017-01-07 | Discharge: 2017-01-07 | Disposition: A | Discharge status: Home / Self Care | Payer: PPO | Source: Ambulatory Visit | Attending: Family Medicine | Admitting: Family Medicine

## 2017-01-07 DIAGNOSIS — Z1231 Encounter for screening mammogram for malignant neoplasm of breast: Secondary | ICD-10-CM | POA: Diagnosis not present

## 2017-01-08 DIAGNOSIS — M545 Low back pain: Secondary | ICD-10-CM | POA: Diagnosis not present

## 2017-01-08 DIAGNOSIS — M25552 Pain in left hip: Secondary | ICD-10-CM | POA: Diagnosis not present

## 2017-01-09 ENCOUNTER — Ambulatory Visit: Payer: PPO | Admitting: Physical Therapy

## 2017-01-19 ENCOUNTER — Ambulatory Visit: Payer: PPO | Admitting: Physical Therapy

## 2017-01-19 DIAGNOSIS — M62830 Muscle spasm of back: Secondary | ICD-10-CM

## 2017-01-19 DIAGNOSIS — M25652 Stiffness of left hip, not elsewhere classified: Secondary | ICD-10-CM

## 2017-01-19 DIAGNOSIS — G8929 Other chronic pain: Secondary | ICD-10-CM

## 2017-01-19 DIAGNOSIS — M6281 Muscle weakness (generalized): Secondary | ICD-10-CM

## 2017-01-19 DIAGNOSIS — M5441 Lumbago with sciatica, right side: Secondary | ICD-10-CM

## 2017-01-19 NOTE — Therapy (Signed)
[place], [place], [address] Phone: [phone]   Fax:  [phone]  Physical Therapy Treatment  Patient Details  Name: Shelia [name] MRN: [id number] Date of Birth: 04-23-1941 Referring Provider: Dr. Berenice Primas   Encounter Date: 01/19/2017      PT End of Session - 01/19/17 0937    Visit Number 15   Number of Visits 21   Date for PT Re-Evaluation 02/10/17   Authorization Type healthstream medicare    Authorization Time Period KX MOD at 15 visits    PT Start Time 0935   PT Stop Time 1018   PT Time Calculation (min) 43 min   Activity Tolerance Patient tolerated treatment well   Behavior During Therapy [facility] for tasks assessed/performed      Past Medical History:  Diagnosis Date  . Anxiety   . Arthritis    "left knee; left hip; lower back; mild to moderate in right hip" (09/30/2016)  . Chronic lower back pain   . Gastritis   . Heart palpitations   . Hypercholesteremia   . Seasonal allergies     Past Surgical History:  Procedure Laterality Date  . COLONOSCOPY    . DILATION AND CURETTAGE OF UTERUS    . JOINT REPLACEMENT    . TONSILLECTOMY    . 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   Procedure: TOTAL HIP ARTHROPLASTY ANTERIOR APPROACH;  Surgeon: Dorna Leitz, MD;  Location: [place];  Service: Orthopedics;  Laterality: Left;    There were no vitals filed for this visit.      Subjective Assessment - 01/19/17 0938    Subjective Returned from vacation.  Did alot of walking.  No structured exercise.     Currently in Pain? Yes   Pain Score 2    Pain Location Hip   Pain Orientation Left   Pain Descriptors / Indicators Sore   Pain Type Chronic pain   Pain Onset More than a month ago   Pain Frequency Intermittent   Aggravating Factors  walking on uneven terrain   Pain Relieving Factors stretching, heat    Pain Score 2   Pain Location Back   Pain  Orientation Left   Pain Descriptors / Indicators Aching   Pain Type Chronic pain   Pain Onset More than a month ago   Pain Frequency Intermittent   Aggravating Factors  standing    Pain Relieving Factors heat, rest , exercise              [facility] Adult PT Treatment/Exercise - 01/19/17 0001      Lumbar Exercises: Standing   Other Standing Lumbar Exercises band hip hinge each LE x 10 Yellow ( med heavy)      Lumbar Exercises: Supine   Bridge 10 reps   Bridge Limitations yellow band , added clam x 10    Other Supine Lumbar Exercises single leg bridge x 5 with yellow band loop      Lumbar Exercises: Sidelying   Clam 15 reps   Clam Limitations yellow loop    Hip Abduction 10 reps   Hip Abduction Weights (lbs) yellow loop    Other Sidelying Lumbar Exercises sidekick without loop (flex     Lumbar Exercises: Quadruped   Plank moving bird dog x 10 each LE    Other Quadruped Lumbar Exercises childs pose limited in L hip flexion  PT Education - 01/19/17 1024    Education provided Yes   Education Details glute activation, exercises to combat sitting    Person(s) Educated Patient   Methods Explanation   Comprehension Verbalized understanding          PT Short Term Goals - 01/19/17 1026      PT SHORT TERM GOAL #1   Title Patient will report < 2/10 pain in the L hip with most everyday activities.    Status Partially Met     PT SHORT TERM GOAL #2   Title Patient will be independent with initial HEP for core stability    Status Achieved     PT SHORT TERM GOAL #3   Title Pt will be able to tolerate sitting for up to 30 min with only min pain.    Status Achieved           PT Long Term Goals - 01/19/17 1027      PT LONG TERM GOAL #1   Title Patient will demonstrate proper technique with home program for core and hip strengthening    Status On-going     PT LONG TERM GOAL #2   Title Patient will ambulate 2 miles without increased pain in order  to perfrom IADL's and walk for exercises    Baseline pain after the walk, but not during   Status Partially Met     PT LONG TERM GOAL #3   Title Patient will stand for 30-45 min with min increased pain in order to perform IADL's    Baseline improving   Status Partially Met     PT LONG TERM GOAL #4   Title FOTO score will improve to <25% impaired to demo functional improvement.    Status Unable to assess               Plan - 01/19/17 1024    Clinical Impression Statement Pt has not been to PT in 2 weeks.  She did well but has gotten off her routine.  her diet adds to arthritis pain and soreness . Will cont to challenge hip strength, flexibility.     PT Next Visit Plan  mat level HEP for hip and core strength, progress to standing, Reformer ex.    PT Home Exercise Plan bridge, clam, HHPT HEP , hip abd in SL and standing , sit to stand with band , ball ex    Consulted and Agree with Plan of Care Patient      Patient will benefit from skilled therapeutic intervention in order to improve the following deficits and impairments:  Abnormal gait, Decreased mobility, Increased muscle spasms, Decreased strength, Difficulty walking, Increased fascial restricitons, Decreased knowledge of use of DME, Decreased activity tolerance, Decreased range of motion, Impaired flexibility, Pain  Visit Diagnosis: Muscle weakness (generalized)  Chronic right-sided low back pain with right-sided sciatica  Muscle spasm of back  Stiffness of left hip, not elsewhere classified     Problem List Patient Active Problem List   Diagnosis Date Noted  . Primary osteoarthritis of left hip 09/29/2016  . Urinary incontinence 06/25/2016  . Lumbar radiculopathy 06/18/2016  . 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 Abnormality of gait 02/02/2013  . Nonallopathic lesion of lumbar region 04/14/2011  . OSTEOARTHRITIS, HIP, LEFT 11/14/2009  . METATARSALGIA 11/14/2009  . BUNION, RIGHT FOOT 11/14/2009  . HIP PAIN,  LEFT 10/02/2009  . Pain in joint, lower leg 10/02/2009  . LOW BACK PAIN, CHRONIC 10/02/2009  Damario Gillie 01/19/2017, 10:28 AM  [place], [place], [address] Phone: [phone]   Fax:  [phone]  Name: Shelia [name] MRN: [id number] Date of Birth: 11-02-1940  [name], PT 01/19/17 10:28 AM Phone: [phone] Fax: [phone]

## 2017-01-23 ENCOUNTER — Encounter: Payer: PPO | Admitting: Physical Therapy

## 2017-02-05 ENCOUNTER — Ambulatory Visit: Payer: PPO | Attending: Orthopedic Surgery | Admitting: Physical Therapy

## 2017-02-05 DIAGNOSIS — G8929 Other chronic pain: Secondary | ICD-10-CM | POA: Diagnosis not present

## 2017-02-05 DIAGNOSIS — M6281 Muscle weakness (generalized): Secondary | ICD-10-CM | POA: Insufficient documentation

## 2017-02-05 DIAGNOSIS — M5441 Lumbago with sciatica, right side: Secondary | ICD-10-CM | POA: Diagnosis not present

## 2017-02-05 DIAGNOSIS — M25652 Stiffness of left hip, not elsewhere classified: Secondary | ICD-10-CM | POA: Insufficient documentation

## 2017-02-05 DIAGNOSIS — M62830 Muscle spasm of back: Secondary | ICD-10-CM | POA: Diagnosis not present

## 2017-02-05 NOTE — Therapy (Signed)
[place], [place], [address] Phone: [phone]   Fax:  [phone]  Physical Therapy Treatment  Patient Details  Name: Shelia [name] MRN: [id number] Date of Birth: Mar 09, 1941 Referring Provider: Dr. Berenice Primas   Encounter Date: 02/05/2017      PT End of Session - 02/05/17 1129    Visit Number 16   Number of Visits 21   Date for PT Re-Evaluation 02/10/17   Authorization Type healthstream medicare    Authorization Time Period KX MOD at 15 visits    PT Start Time 1015   PT Stop Time 1113   PT Time Calculation (min) 58 min   Activity Tolerance Patient tolerated treatment well   Behavior During Therapy [facility] for tasks assessed/performed      Past Medical History:  Diagnosis Date  . Anxiety   . Arthritis    "left knee; left hip; lower back; mild to moderate in right hip" (09/30/2016)  . Chronic lower back pain   . Gastritis   . Heart palpitations   . Hypercholesteremia   . Seasonal allergies     Past Surgical History:  Procedure Laterality Date  . COLONOSCOPY    . DILATION AND CURETTAGE OF UTERUS    . JOINT REPLACEMENT    . TONSILLECTOMY    . 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   Procedure: TOTAL HIP ARTHROPLASTY ANTERIOR APPROACH;  Surgeon: Dorna Leitz, MD;  Location: [place];  Service: Orthopedics;  Laterality: Left;    There were no vitals filed for this visit.      Subjective Assessment - 02/05/17 1102    Subjective Was in [place] for 2 weeks.  Achy and off track due to lack of regular routine.  4 mile walk and I had to stop a lot more, tried to pace myself.  More pain the next day.    Currently in Pain? Yes   Pain Score 2    Pain Score 2   Pain Location Back            [facility] Adult PT Treatment/Exercise - 02/05/17 0001      Lumbar Exercises: Machines for Strengthening   Other Lumbar Machine Exercise Pilates Reformer      Moist Heat  Therapy   Number Minutes Moist Heat 10 Minutes   Moist Heat Location Lumbar Spine;Hip     Manual Therapy   Passive ROM prone hip L ER and IR to tol    very tight bilaterally, L >R       Pilates Reformer used for LE/core strength, postural strength, lumbopelvic disassociation and core control.  Exercises included: Using jump board:   Footwork 2 red 1 Blue double and single leg, still needs min cueing to level pelvis especially on L side, multiple reps needed with manual cues.   Single leg work in sidelying 1 Red and 1 blue , done in parallel and with hip ER for lateral hip challenge.           PT Education - 02/05/17 1113    Education provided Yes   Education Details POC, ROM of hips    Person(s) Educated Patient   Methods Explanation   Comprehension Verbalized understanding;Returned demonstration          PT Short Term Goals - 02/05/17 1255      PT SHORT TERM GOAL #1   Title Patient will report < 2/10 pain in the L hip with most  everyday activities.    Baseline is usually stiff, gets better as she moves a bit more   Status Partially Met     PT SHORT TERM GOAL #2   Title Patient will be independent with initial HEP for core stability    Status Achieved     PT SHORT TERM GOAL #3   Title Pt will be able to tolerate sitting for up to 30 min with only min pain.    Status Achieved           PT Long Term Goals - 02/05/17 1255      PT LONG TERM GOAL #1   Title Patient will demonstrate proper technique with home program for core and hip strengthening    Status On-going     PT LONG TERM GOAL #2   Title Patient will ambulate 2 miles without increased pain in order to perfrom IADL's and walk for exercises    Baseline pain after the walk, but not during   Status Partially Met     PT LONG TERM GOAL #3   Title Patient will stand for 30-45 min with min increased pain in order to perform IADL's    Status Partially Met     PT LONG TERM GOAL #4   Title FOTO score  will improve to <25% impaired to demo functional improvement.    Status Unable to assess               Plan - 02/05/17 1253    Clinical Impression Statement Pt with many questions today regarding pelvic floor treatment and referral process for PT.  She is very tight bilaterally in ER.  She plans to do community based Pilates Reformer classes begininnig in August.  She will be DC after next visit.    PT Next Visit Plan full mat HEP review, FOTO and DC> GCODE    PT Home Exercise Plan bridge, clam, HHPT HEP , hip abd in SL and standing , sit to stand with band , ball ex    Consulted and Agree with Plan of Care Patient      Patient will benefit from skilled therapeutic intervention in order to improve the following deficits and impairments:  Abnormal gait, Decreased mobility, Increased muscle spasms, Decreased strength, Difficulty walking, Increased fascial restricitons, Decreased knowledge of use of DME, Decreased activity tolerance, Decreased range of motion, Impaired flexibility, Pain  Visit Diagnosis: Muscle weakness (generalized)  Chronic right-sided low back pain with right-sided sciatica  Muscle spasm of back  Stiffness of left hip, not elsewhere classified     Problem List Patient Active Problem List   Diagnosis Date Noted  . Primary osteoarthritis of left hip 09/29/2016  . Urinary incontinence 06/25/2016  . Lumbar radiculopathy 06/18/2016  . 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 Abnormality of gait 02/02/2013  . Nonallopathic lesion of lumbar region 04/14/2011  . OSTEOARTHRITIS, HIP, LEFT 11/14/2009  . METATARSALGIA 11/14/2009  . BUNION, RIGHT FOOT 11/14/2009  . HIP PAIN, LEFT 10/02/2009  . Pain in joint, lower leg 10/02/2009  . LOW BACK PAIN, CHRONIC 10/02/2009    Shelia [name] 02/05/2017, 12:57 PM  [facility] [address] [place], [place], [address] Phone: [phone]   Fax:  [phone]  Name: Shelia [name] MRN: [id number] Date of Birth: Mar 03, 1941   [name], PT 02/05/17 1:09 PM Phone: [phone] Fax: [phone]

## 2017-02-09 ENCOUNTER — Encounter: Payer: Self-pay | Admitting: Physical Therapy

## 2017-02-09 ENCOUNTER — Ambulatory Visit: Payer: PPO | Admitting: Physical Therapy

## 2017-02-09 DIAGNOSIS — M62830 Muscle spasm of back: Secondary | ICD-10-CM

## 2017-02-09 DIAGNOSIS — G8929 Other chronic pain: Secondary | ICD-10-CM

## 2017-02-09 DIAGNOSIS — M25652 Stiffness of left hip, not elsewhere classified: Secondary | ICD-10-CM

## 2017-02-09 DIAGNOSIS — M5441 Lumbago with sciatica, right side: Secondary | ICD-10-CM

## 2017-02-09 DIAGNOSIS — M6281 Muscle weakness (generalized): Secondary | ICD-10-CM

## 2017-02-09 NOTE — Therapy (Signed)
[place], [place], [address] Phone: [phone]   Fax:  [phone]  Physical Therapy Treatment and Discharge   Patient Details  Name: Shelia [name] MRN: [id number] Date of Birth: [birth year] Referring Provider: Dr. Berenice Primas   Encounter Date: 02/09/2017      PT End of Session - 02/09/17 1126    Visit Number 17   Number of Visits 21   Date for PT Re-Evaluation 02/10/17   Authorization Type healthstream medicare    Authorization Time Period KX MOD at 15 visits    PT Start Time 1105   PT Stop Time 1145   PT Time Calculation (min) 40 min   Activity Tolerance Patient tolerated treatment well   Behavior During Therapy [facility] for tasks assessed/performed      Past Medical History:  Diagnosis Date  . Anxiety   . Arthritis    "left knee; left hip; lower back; mild to moderate in right hip" (09/30/2016)  . Chronic lower back pain   . Gastritis   . Heart palpitations   . Hypercholesteremia   . Seasonal allergies     Past Surgical History:  Procedure Laterality Date  . COLONOSCOPY    . DILATION AND CURETTAGE OF UTERUS    . JOINT REPLACEMENT    . TONSILLECTOMY    . 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   Procedure: TOTAL HIP ARTHROPLASTY ANTERIOR APPROACH;  Surgeon: Dorna Leitz, MD;  Location: [place];  Service: Orthopedics;  Laterality: Left;    There were no vitals filed for this visit.      Subjective Assessment - 02/09/17 1107    Subjective I have a bit more pain lately.  Last day today.    Currently in Pain? Yes   Pain Score 2    Pain Location Hip   Pain Orientation Left   Pain Descriptors / Indicators Aching   Pain Type Chronic pain   Pain Onset More than a month ago   Aggravating Factors  inactviity and walking on uneven ground    Pain Relieving Factors stretching , heat              [facility] Adult PT Treatment/Exercise - 02/09/17 0001       Lumbar Exercises: Supine   Ab Set 10 reps   AB Set Limitations 2 sets, ball at knees and ball at thighs    Clam 10 reps   Clam Limitations band unilateral    Bridge 10 reps   Bridge Limitations band in parallel x 10 and then 10 with feet together      Knee/Hip Exercises: Stretches   Active Hamstring Stretch Left;2 reps   Hip Flexor Stretch Both;2 reps   Hip Flexor Stretch Limitations standing    Piriformis Stretch Left;2 reps   Other Knee/Hip Stretches legs crossed with rotation to Rt.      Knee/Hip Exercises: Aerobic   Elliptical 5 min level 2 for warm up     Knee/Hip Exercises: Standing   Hip Flexion Stengthening;Both;1 set   Hip Flexion Limitations in mirror for hip stability    Hip Abduction Stengthening;Both;1 set   Abduction Limitations hip demo of hip stability    Gait Training watched gait for pelvic stability as I explained possible cause of backpain with walking, no deviation noted in short distances                   PT Short Term  Goals - 02/09/17 1134      PT SHORT TERM GOAL #1   Title Patient will report < 2/10 pain in the L hip with most everyday activities.    Status Partially Met     PT SHORT TERM GOAL #2   Title Patient will be independent with initial HEP for core stability    Status Achieved     PT SHORT TERM GOAL #3   Title Pt will be able to tolerate sitting for up to 30 min with only min pain.    Status Achieved           PT Long Term Goals - 02/09/17 1130      PT LONG TERM GOAL #1   Title Patient will demonstrate proper technique with home program for core and hip strengthening    Status Achieved     PT LONG TERM GOAL #2   Title Patient will ambulate 2 miles without increased pain in order to perfrom IADL's and walk for exercises    Status Partially Met     PT LONG TERM GOAL #3   Title Patient will stand for 30-45 min with min increased pain in order to perform IADL's    Baseline improved but pain can be moderate    Status  Partially Met     PT LONG TERM GOAL #4   Title FOTO score will improve to <25% impaired to demo functional improvement.    Status Not Met               Plan - 02/09/17 1243    Clinical Impression Statement Pt continues to have L hip pain and L sided back pain.  Her FOTO score improved initially but is now back to baseline.  Subjectively she has improved function, but due to a recent trip and getting out of her routine, she has noticed increased pain lately.  Knee pain as well.  She will continue her HEP and try community based Pilates classes.     PT Next Visit Plan DC    PT Home Exercise Plan bridge, clam, HHPT HEP , hip abd in SL and standing , sit to stand with band , ball ex    Consulted and Agree with Plan of Care Patient      Patient will benefit from skilled therapeutic intervention in order to improve the following deficits and impairments:  Abnormal gait, Decreased mobility, Increased muscle spasms, Decreased strength, Difficulty walking, Increased fascial restricitons, Decreased knowledge of use of DME, Decreased activity tolerance, Decreased range of motion, Impaired flexibility, Pain  Visit Diagnosis: Muscle weakness (generalized)  Chronic right-sided low back pain with right-sided sciatica  Stiffness of left hip, not elsewhere classified  Muscle spasm of back     Problem List Patient Active Problem List   Diagnosis Date Noted  . Primary osteoarthritis of left hip 09/29/2016  . Urinary incontinence 06/25/2016  . Lumbar radiculopathy 06/18/2016  . 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 Abnormality of gait 02/02/2013  . Nonallopathic lesion of lumbar region 04/14/2011  . OSTEOARTHRITIS, HIP, LEFT 11/14/2009  . METATARSALGIA 11/14/2009  . BUNION, RIGHT FOOT 11/14/2009  . HIP PAIN, LEFT 10/02/2009  . Pain in joint, lower leg 10/02/2009  . LOW BACK PAIN, CHRONIC 10/02/2009    Tavien Chestnut 02/09/2017, 1:17 PM  [facility] [address] [place], [place], [address] Phone: [phone]   Fax:  [phone]  Name: Shelia [name] MRN: [id number] Date of Birth: [birth year]  PHYSICAL THERAPY DISCHARGE SUMMARY  Visits from Start of Care: 17  Current functional level related to goals / functional outcomes:  Pain in L hip, knee and low back.  Limits her walking and comfort in standing periods of > 30 min    Remaining deficits: See above    Education / Equipment: HEP, Pilates Plan: Patient agrees to discharge.  Patient goals were partially met. Patient is being discharged due to being pleased with the current functional level.  ?????    And plateau of progress.   [name], PT 02/09/17 1:19 PM Phone: [phone] Fax: [phone]

## 2017-03-16 DIAGNOSIS — H2513 Age-related nuclear cataract, bilateral: Secondary | ICD-10-CM | POA: Diagnosis not present

## 2017-03-16 DIAGNOSIS — H16223 Keratoconjunctivitis sicca, not specified as Sjogren's, bilateral: Secondary | ICD-10-CM | POA: Diagnosis not present

## 2017-03-16 DIAGNOSIS — H43811 Vitreous degeneration, right eye: Secondary | ICD-10-CM | POA: Diagnosis not present

## 2017-05-19 DIAGNOSIS — H25043 Posterior subcapsular polar age-related cataract, bilateral: Secondary | ICD-10-CM | POA: Diagnosis not present

## 2017-05-19 DIAGNOSIS — H02839 Dermatochalasis of unspecified eye, unspecified eyelid: Secondary | ICD-10-CM | POA: Diagnosis not present

## 2017-05-19 DIAGNOSIS — H2511 Age-related nuclear cataract, right eye: Secondary | ICD-10-CM | POA: Diagnosis not present

## 2017-05-19 DIAGNOSIS — H2513 Age-related nuclear cataract, bilateral: Secondary | ICD-10-CM | POA: Diagnosis not present

## 2017-05-19 DIAGNOSIS — H25013 Cortical age-related cataract, bilateral: Secondary | ICD-10-CM | POA: Diagnosis not present

## 2017-07-20 ENCOUNTER — Ambulatory Visit: Payer: PPO | Admitting: Sports Medicine

## 2017-07-24 ENCOUNTER — Ambulatory Visit: Payer: PPO | Admitting: Podiatry

## 2017-07-25 DIAGNOSIS — Z96642 Presence of left artificial hip joint: Secondary | ICD-10-CM | POA: Diagnosis not present

## 2017-07-25 DIAGNOSIS — M1712 Unilateral primary osteoarthritis, left knee: Secondary | ICD-10-CM | POA: Diagnosis not present

## 2017-07-29 DIAGNOSIS — Z01419 Encounter for gynecological examination (general) (routine) without abnormal findings: Secondary | ICD-10-CM | POA: Diagnosis not present

## 2017-07-29 DIAGNOSIS — N762 Acute vulvitis: Secondary | ICD-10-CM | POA: Diagnosis not present

## 2017-07-29 DIAGNOSIS — Z6821 Body mass index (BMI) 21.0-21.9, adult: Secondary | ICD-10-CM | POA: Diagnosis not present

## 2017-07-29 DIAGNOSIS — N3281 Overactive bladder: Secondary | ICD-10-CM | POA: Diagnosis not present

## 2017-08-18 ENCOUNTER — Encounter: Payer: Self-pay | Admitting: Podiatry

## 2017-08-18 ENCOUNTER — Ambulatory Visit: Payer: PPO | Admitting: Podiatry

## 2017-08-18 DIAGNOSIS — Q828 Other specified congenital malformations of skin: Secondary | ICD-10-CM

## 2017-08-18 DIAGNOSIS — M2011 Hallux valgus (acquired), right foot: Secondary | ICD-10-CM

## 2017-08-18 NOTE — Progress Notes (Signed)
Subjective:     Patient ID: Shelia [name], female   DOB: 06/07/1941, 77 y.o.   MRN: [id number]  HPI this patient presents to my office with chief complaint of a painful callus in the middle of her right foot. She states this callus is painful walking and wearing her shoes.  She presents the office today for an evaluation and treatment of this condition   Review of Systems     Objective:   Physical Exam GENERAL APPEARANCE: Alert, conversant. Appropriately groomed. No acute distress.  VASCULAR: Pedal pulses are  palpable at  [facility] and PT bilateral.  Capillary refill time is immediate to all digits,  Normal temperature gradient.  Digital hair growth is present bilateral  NEUROLOGIC: sensation is normal to 5.07 monofilament at 5/5 sites bilateral.  Light touch is intact bilateral, Muscle strength normal.  MUSCULOSKELETAL: acceptable muscle strength, tone and stability bilateral.  Intrinsic muscluature intact bilateral.  Contracted fifth toe left foot.  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  Hammer toes 2,3 right foot.   DERMATOLOGIC: skin color, texture, and turgor are within normal limits.  No preulcerative lesions or ulcers  are seen, no interdigital maceration noted.  No open lesions present.  Digital nails are asymptomatic. No drainage noted. Porokeratosis right forefoot.      Assessment:     Porokeratosis right foot     Plan:     Debridement of skin lesion.  RTC prn.   Gardiner Barefoot DPM

## 2017-08-19 ENCOUNTER — Telehealth: Payer: Self-pay | Admitting: Sports Medicine

## 2017-08-19 DIAGNOSIS — J069 Acute upper respiratory infection, unspecified: Secondary | ICD-10-CM | POA: Diagnosis not present

## 2017-08-19 NOTE — Telephone Encounter (Signed)
Copied from [place] [id number]. Topic: General - Other >> Aug 19, 2017  9:27 AM Arbutus Leas wrote: I called the patient and left a message. Reason for call was to see if the patient would like to reschedule their cancelled appointment with Dr. Paulla Fore from 07/20/17. Okay to reschedule if the patient would like to.  >> Aug 19, 2017  1:12 PM Cleaster Corin, [place] wrote: Pt. Called back and said she is all set no need to reschedule.

## 2017-08-19 NOTE — Telephone Encounter (Signed)
See note

## 2017-08-21 DIAGNOSIS — R300 Dysuria: Secondary | ICD-10-CM | POA: Diagnosis not present

## 2017-10-23 DIAGNOSIS — R300 Dysuria: Secondary | ICD-10-CM | POA: Diagnosis not present

## 2017-10-23 DIAGNOSIS — N3000 Acute cystitis without hematuria: Secondary | ICD-10-CM | POA: Diagnosis not present

## 2017-11-03 ENCOUNTER — Ambulatory Visit
Admission: RE | Admit: 2017-11-03 | Discharge: 2017-11-03 | Disposition: A | Discharge status: Home / Self Care | Payer: PPO | Source: Ambulatory Visit | Attending: Family Medicine | Admitting: Family Medicine

## 2017-11-03 ENCOUNTER — Other Ambulatory Visit: Payer: Self-pay | Admitting: Family Medicine

## 2017-11-03 DIAGNOSIS — M25531 Pain in right wrist: Secondary | ICD-10-CM

## 2017-11-03 DIAGNOSIS — H2513 Age-related nuclear cataract, bilateral: Secondary | ICD-10-CM | POA: Diagnosis not present

## 2017-11-03 DIAGNOSIS — H2511 Age-related nuclear cataract, right eye: Secondary | ICD-10-CM | POA: Diagnosis not present

## 2017-11-03 DIAGNOSIS — H25013 Cortical age-related cataract, bilateral: Secondary | ICD-10-CM | POA: Diagnosis not present

## 2017-11-03 DIAGNOSIS — H02839 Dermatochalasis of unspecified eye, unspecified eyelid: Secondary | ICD-10-CM | POA: Diagnosis not present

## 2017-11-03 DIAGNOSIS — H25043 Posterior subcapsular polar age-related cataract, bilateral: Secondary | ICD-10-CM | POA: Diagnosis not present

## 2017-11-03 DIAGNOSIS — S6991XA Unspecified injury of right wrist, hand and finger(s), initial encounter: Secondary | ICD-10-CM | POA: Diagnosis not present

## 2017-11-25 ENCOUNTER — Other Ambulatory Visit: Payer: Self-pay | Admitting: Family Medicine

## 2017-11-25 DIAGNOSIS — Z1231 Encounter for screening mammogram for malignant neoplasm of breast: Secondary | ICD-10-CM

## 2017-11-30 DIAGNOSIS — Z0000 Encounter for general adult medical examination without abnormal findings: Secondary | ICD-10-CM | POA: Diagnosis not present

## 2017-11-30 DIAGNOSIS — F39 Unspecified mood [affective] disorder: Secondary | ICD-10-CM | POA: Diagnosis not present

## 2017-11-30 DIAGNOSIS — E7800 Pure hypercholesterolemia, unspecified: Secondary | ICD-10-CM | POA: Diagnosis not present

## 2017-11-30 DIAGNOSIS — K2970 Gastritis, unspecified, without bleeding: Secondary | ICD-10-CM | POA: Diagnosis not present

## 2017-11-30 DIAGNOSIS — M859 Disorder of bone density and structure, unspecified: Secondary | ICD-10-CM | POA: Diagnosis not present

## 2017-11-30 DIAGNOSIS — M1990 Unspecified osteoarthritis, unspecified site: Secondary | ICD-10-CM | POA: Diagnosis not present

## 2017-11-30 DIAGNOSIS — R32 Unspecified urinary incontinence: Secondary | ICD-10-CM | POA: Diagnosis not present

## 2017-11-30 DIAGNOSIS — Z131 Encounter for screening for diabetes mellitus: Secondary | ICD-10-CM | POA: Diagnosis not present

## 2017-12-03 ENCOUNTER — Other Ambulatory Visit: Payer: Self-pay

## 2017-12-03 ENCOUNTER — Encounter: Payer: Self-pay | Admitting: Physical Therapy

## 2017-12-03 ENCOUNTER — Ambulatory Visit: Payer: PPO | Attending: Family Medicine | Admitting: Physical Therapy

## 2017-12-03 DIAGNOSIS — M6281 Muscle weakness (generalized): Secondary | ICD-10-CM | POA: Diagnosis not present

## 2017-12-03 DIAGNOSIS — R278 Other lack of coordination: Secondary | ICD-10-CM | POA: Diagnosis not present
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
## 2017-12-03 NOTE — Patient Instructions (Signed)
Relaxation Exercises with the Urge to Void   When you experience an urge to void:  FIRST  Stop and stand very still    Sit down if you can    Don't move    You need to stay very still to maintain control  SECOND Squeeze your pelvic floor muscles 5 times, like a quick flick, to keep from leaking  THIRD Relax  Take a deep breath and then let it out  Try to make the urge go away by using relaxation and visualization techniques  FINALLY When you feel the urge go away somewhat, walk normally to the bathroom.   If the urge gets suddenly stronger on the way, you may stop again and relax to regain control.  [facility] [address], [address] [place], [place] [address] Phone # [phone] Fax [phone]  

## 2017-12-04 ENCOUNTER — Encounter: Payer: Self-pay | Admitting: Physical Therapy

## 2017-12-04 ENCOUNTER — Ambulatory Visit: Payer: PPO | Admitting: Physical Therapy

## 2017-12-04 DIAGNOSIS — M6281 Muscle weakness (generalized): Secondary | ICD-10-CM | POA: Diagnosis not present

## 2017-12-04 DIAGNOSIS — R278 Other lack of coordination: Secondary | ICD-10-CM

## 2017-12-04 NOTE — Therapy (Signed)
[facility] [address], [place], [place], [address] Phone: [phone]   Fax:  [phone]  Physical Therapy Treatment  Patient Details  Name: Shelia [name] MRN: [id number] Date of Birth: Sep 17, 1940 Referring Provider: [name], MD   Encounter Date: 12/04/2017  PT End of Session - 12/04/17 1106    Visit Number  2    Date for PT Re-Evaluation  01/28/18    PT Start Time  1103    PT Stop Time  1145    PT Time Calculation (min)  42 min    Activity Tolerance  Patient tolerated treatment well    Behavior During Therapy  [facility] for tasks assessed/performed       Past Medical History:  Diagnosis Date  . Anxiety   . Arthritis    "left knee; left hip; lower back; mild to moderate in right hip" (09/30/2016)  . Chronic lower back pain   . Gastritis   . Heart palpitations   . Hypercholesteremia   . Seasonal allergies     Past Surgical History:  Procedure Laterality Date  . COLONOSCOPY    . DILATION AND CURETTAGE OF UTERUS    . JOINT REPLACEMENT    . TONSILLECTOMY    . 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   Procedure: TOTAL HIP ARTHROPLASTY ANTERIOR APPROACH;  Surgeon: Dorna Leitz, MD;  Location: [facility];  Service: Orthopedics;  Laterality: Left;    There were no vitals filed for this visit.  Subjective Assessment - 12/03/17 1250    Subjective  I have been having leakage some days.  Has been full bladder, but usually [place]poon.  I have had more frequent UTIs.  Still have low back pain which I thought the hip replacement would have helped with.    Pertinent History  vaginal deliveries, chronic UTI, history of low back pain, left hip replacement    Patient Stated Goals  Not have leakage and feel like I can drink water when needed    Currently in Pain?  No/denies                       [facility] Adult PT Treatment/Exercise - 12/04/17 0001      Neuro  Re-ed    Neuro Re-ed Details   able relax down to 43mV from 12; able to contract 25mV in supine; biofeedback with all exercises      Exercises   Exercises  Lumbar      Lumbar Exercises: Seated   Other Seated Lumbar Exercises  ball squeeze and clam with yellow band - pelvic floor contract and relax 2-3 sec hold - 2 x 10      Lumbar Exercises: Supine   Clam  10 reps;1 second yellow band    Straight Leg Raise  10 reps;1 second pelvic floor contraction    Other Supine Lumbar Exercises  ball squeeze with pelvic floor contract and TrA             PT Education - 12/04/17 1157    Education provided  Yes    Education Details  HEP and elvie info    Person(s) Educated  Patient    Methods  Explanation;Demonstration;Handout;Verbal cues    Comprehension  Verbalized understanding;Returned demonstration       PT Short Term Goals - 12/03/17 1547      PT SHORT TERM GOAL #1   Title  ind with initial HEP  Baseline  ...    Time  4    Period  Weeks    Status  New    Target Date  12/31/17      PT SHORT TERM GOAL #2   Title  ...      PT SHORT TERM GOAL #3   Title  ...    Baseline  ...        PT Long Term Goals - 12/03/17 1544      PT LONG TERM GOAL #1   Title  Patient will demonstrate proper technique with home program for core and hip strengthening     Time  8    Period  Weeks    Status  New    Target Date  01/28/18      PT LONG TERM GOAL #2   Title  Pt will report ability to go to the store and manage urge to void without leakage or feeling like she will leak before getting to the bathroom    Baseline  ...    Time  8    Period  Weeks    Status  New    Target Date  01/28/18      PT LONG TERM GOAL #3   Title  Pt will be able to make one full weak without leakage    Baseline  ...    Time  8    Period  Weeks    Status  New    Target Date  01/28/18      PT LONG TERM GOAL #4   Title  Pt will be able to cough and sneeze without leakage due to ability to perform knack  technique    Baseline  ...    Time  8    Period  Weeks    Status  New    Target Date  01/28/18            Plan - 12/04/17 1203    Clinical Impression Statement  No progress to note yet due to only coming in for eval yesterday.  Pt was able to understand and begin practicing urge to void techniques.  Pt demonstrated improved resting tone with diaphragmatic breathing.  She did well with biofeeddback.  Pt has weakness of pelvic floor and low endurance, she is able to contract up to 13mV for 1-2 seconds.  She demonstrates fatigue after exercises today.  She will benefit from skilled PT to progress strength and return to maximum function with decreased leakage    PT Treatment/Interventions  ADLs/Self Care Home Management;Biofeedback;Therapeutic activities;Therapeutic exercise;Neuromuscular re-education;Patient/family education;Electrical Stimulation;Cryotherapy;Moist Heat;Manual techniques;Passive range of motion;Dry needling;Taping    PT Next Visit Plan  progress strength in different positions, biofeedback    Consulted and Agree with Plan of Care  Patient       Patient will benefit from skilled therapeutic intervention in order to improve the following deficits and impairments:  Pain, Postural dysfunction, Decreased endurance, Decreased strength, Hypomobility  Visit Diagnosis: Muscle weakness (generalized)  Other lack of coordination     Problem List Patient Active Problem List   Diagnosis Date Noted  . Primary osteoarthritis of left hip 09/29/2016  . Urinary incontinence 06/25/2016  . Lumbar radiculopathy 06/18/2016  . 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 Abnormality of gait 02/02/2013  . Nonallopathic lesion of lumbar region 04/14/2011  . OSTEOARTHRITIS, HIP, LEFT 11/14/2009  . METATARSALGIA 11/14/2009  . BUNION, RIGHT FOOT 11/14/2009  . HIP PAIN, LEFT 10/02/2009  . Pain in joint,  lower leg 10/02/2009  . LOW BACK PAIN, CHRONIC 10/02/2009    Zannie Cove, PT 12/04/2017,  12:52 PM  [facility] [address], [place], [place], [address] Phone: [phone]   Fax:  [phone]  Name: TANGIE STAY MRN: [id number] Date of Birth: 06-Jun-1941

## 2017-12-04 NOTE — Patient Instructions (Signed)
[facility] - [email or web address]  Access Code: [email or web address]  URL: [email or web address]  Date: 12/04/2017  Prepared by: Lovett Calender   Exercises  Seated Pelvic Floor Contraction with Isometric Hip Adduction - 10 reps - 1 sets - 3 sec hold - 3x daily - 7x weekly  Seated Hip Abduction with Pelvic Floor Contraction and Resistance Loop - 10 reps - 1 sets - 3 sec hold - 3x daily - 7x weekly  Supine Straight Leg Raise with Pelvic Floor Contraction - 10 reps - 1 sets - 1 second hold - 3x daily - 7x weekly

## 2017-12-07 ENCOUNTER — Ambulatory Visit: Payer: PPO | Admitting: Physical Therapy

## 2017-12-07 ENCOUNTER — Encounter: Payer: Self-pay | Admitting: Physical Therapy

## 2017-12-07 DIAGNOSIS — M6281 Muscle weakness (generalized): Secondary | ICD-10-CM | POA: Diagnosis not present

## 2017-12-07 DIAGNOSIS — R278 Other lack of coordination: Secondary | ICD-10-CM

## 2017-12-07 NOTE — Therapy (Signed)
[facility] [address] [place], [place], [address] Phone: [phone]   Fax:  [phone]  Physical Therapy Treatment  Patient Details  Name: Shelia [name] MRN: [id number] Date of Birth: 26-Oct-1940 Referring Provider: [name], MD   Encounter Date: 12/07/2017  PT End of Session - 12/07/17 1402    Visit Number  3    Date for PT Re-Evaluation  01/28/18    PT Start Time  0947    PT Stop Time  1444    PT Time Calculation (min)  42 min    Activity Tolerance  Patient tolerated treatment well    Behavior During Therapy  [facility] for tasks assessed/performed       Past Medical History:  Diagnosis Date  . Anxiety   . Arthritis    "left knee; left hip; lower back; mild to moderate in right hip" (09/30/2016)  . Chronic lower back pain   . Gastritis   . Heart palpitations   . Hypercholesteremia   . Seasonal allergies     Past Surgical History:  Procedure Laterality Date  . COLONOSCOPY    . DILATION AND CURETTAGE OF UTERUS    . JOINT REPLACEMENT    . TONSILLECTOMY    . 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   Procedure: TOTAL HIP ARTHROPLASTY ANTERIOR APPROACH;  Surgeon: Dorna Leitz, MD;  Location: [facility];  Service: Orthopedics;  Laterality: Left;    There were no vitals filed for this visit.  Subjective Assessment - 12/07/17 1404    Subjective  I have been doing the exercises and it seems like it is helping.  I can't feel the contraction with the exercises lying down.    Pertinent History  vaginal deliveries, chronic UTI, history of low back pain, left hip replacement    Patient Stated Goals  Not have leakage and feel like I can drink water when needed    Currently in Pain?  No/denies                       [facility] Adult PT Treatment/Exercise - 12/07/17 0001      Lumbar Exercises: Seated   Sit to Stand  -- attmept but unable to maintain muscle  activation correctly    Other Seated Lumbar Exercises  ball squeeze and clam with yellow band - pelvic floor contract and relax 2-3 sec hold - 2 x 10      Lumbar Exercises: Supine   Clam  10 reps;1 second yellow band    Bent Knee Raise  20 reps;3 seconds               PT Short Term Goals - 12/03/17 1547      PT SHORT TERM GOAL #1   Title  ind with initial HEP    Baseline  ...    Time  4    Period  Weeks    Status  New    Target Date  12/31/17      PT SHORT TERM GOAL #2   Title  ...      PT SHORT TERM GOAL #3   Title  ...    Baseline  ...        PT Long Term Goals - 12/03/17 1544      PT LONG TERM GOAL #1   Title  Patient will demonstrate proper technique with home program for core and hip strengthening  Time  8    Period  Weeks    Status  New    Target Date  01/28/18      PT LONG TERM GOAL #2   Title  Pt will report ability to go to the store and manage urge to void without leakage or feeling like she will leak before getting to the bathroom    Baseline  ...    Time  8    Period  Weeks    Status  New    Target Date  01/28/18      PT LONG TERM GOAL #3   Title  Pt will be able to make one full weak without leakage    Baseline  ...    Time  8    Period  Weeks    Status  New    Target Date  01/28/18      PT LONG TERM GOAL #4   Title  Pt will be able to cough and sneeze without leakage due to ability to perform knack technique    Baseline  ...    Time  8    Period  Weeks    Status  New    Target Date  01/28/18            Plan - 12/07/17 1532    Clinical Impression Statement  Pt has been doing well with exercises.  She was able to demonstrate improved pelvic floor contraction and ability to feel muscles engage in supine with variety of exercises.  Pt still having more difficulty in seated positon.  She does well with pelvic floor biofeedback to facilitate muscle recruitment.  She was unable to perform contraction with standing due to hip and  pelvic floor weakness.  She will benefit from skilled PT to continue working on strengthening in functional positons    PT Treatment/Interventions  ADLs/Self Care Home Management;Biofeedback;Therapeutic activities;Therapeutic exercise;Neuromuscular re-education;Patient/family education;Electrical Stimulation;Cryotherapy;Moist Heat;Manual techniques;Passive range of motion;Dry needling;Taping    PT Next Visit Plan  progress strength in different positions, biofeedback, hip abduction strength    Consulted and Agree with Plan of Care  Patient       Patient will benefit from skilled therapeutic intervention in order to improve the following deficits and impairments:  Pain, Postural dysfunction, Decreased endurance, Decreased strength, Hypomobility  Visit Diagnosis: Muscle weakness (generalized)  Other lack of coordination     Problem List Patient Active Problem List   Diagnosis Date Noted  . Primary osteoarthritis of left hip 09/29/2016  . Urinary incontinence 06/25/2016  . Lumbar radiculopathy 06/18/2016  . 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 Abnormality of gait 02/02/2013  . Nonallopathic lesion of lumbar region 04/14/2011  . OSTEOARTHRITIS, HIP, LEFT 11/14/2009  . METATARSALGIA 11/14/2009  . BUNION, RIGHT FOOT 11/14/2009  . HIP PAIN, LEFT 10/02/2009  . Pain in joint, lower leg 10/02/2009  . LOW BACK PAIN, CHRONIC 10/02/2009    Zannie Cove, PT 12/07/2017, 4:25 PM  [facility] [address] [place], [place], [address] Phone: [phone]   Fax:  [phone]  Name: ROSANGELA FEHRENBACH MRN: [id number] Date of Birth: 09/24/1940

## 2017-12-09 ENCOUNTER — Encounter: Payer: Self-pay | Admitting: Physical Therapy

## 2017-12-09 ENCOUNTER — Ambulatory Visit: Payer: PPO | Admitting: Physical Therapy

## 2017-12-09 DIAGNOSIS — M6281 Muscle weakness (generalized): Secondary | ICD-10-CM

## 2017-12-09 DIAGNOSIS — R278 Other lack of coordination: Secondary | ICD-10-CM

## 2017-12-09 NOTE — Therapy (Signed)
[facility] [address] [place], [place], [address] Phone: [phone]   Fax:  [phone]  Physical Therapy Treatment  Patient Details  Name: Shelia [name] MRN: [id number] Date of Birth: 27-Nov-1940 Referring Provider: [name], MD   Encounter Date: 12/09/2017  PT End of Session - 12/09/17 1016    Visit Number  4    Date for PT Re-Evaluation  01/28/18    PT Start Time  1016    PT Stop Time  1057    PT Time Calculation (min)  41 min    Activity Tolerance  Patient tolerated treatment well    Behavior During Therapy  [facility] for tasks assessed/performed       Past Medical History:  Diagnosis Date  . Anxiety   . Arthritis    "left knee; left hip; lower back; mild to moderate in right hip" (09/30/2016)  . Chronic lower back pain   . Gastritis   . Heart palpitations   . Hypercholesteremia   . Seasonal allergies     Past Surgical History:  Procedure Laterality Date  . COLONOSCOPY    . DILATION AND CURETTAGE OF UTERUS    . JOINT REPLACEMENT    . TONSILLECTOMY    . 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   Procedure: TOTAL HIP ARTHROPLASTY ANTERIOR APPROACH;  Surgeon: Dorna Leitz, MD;  Location: [place];  Service: Orthopedics;  Laterality: Left;    There were no vitals filed for this visit.  Subjective Assessment - 12/09/17 1018    Subjective  I have not had any leaks and feeling better about everything.      Pertinent History  vaginal deliveries, chronic UTI, history of low back pain, left hip replacement    Patient Stated Goals  Not have leakage and feel like I can drink water when needed    Currently in Pain?  No/denies                       [facility] Adult PT Treatment/Exercise - 12/09/17 0001      Neuro Re-ed    Neuro Re-ed Details   seated on ball bouncing and kegel contracitons      Lumbar Exercises: Seated   Long Arc Quad on [place]   Strengthening;Right;Left;10 reps engaging kegel    Other Seated Lumbar Exercises  hip abduction red band on ball - 15 reps with kegel activation      Lumbar Exercises: Sidelying   Clam  Right;Left;10 reps;3 seconds    Hip Abduction  Left;Right;10 reps hip external rotation             PT Education - 12/09/17 1057    Education provided  Yes    Education Details   Access Code: [email or web address]     Person(s) Educated  Patient    Methods  Explanation;Demonstration;Handout;Verbal cues;Tactile cues    Comprehension  Verbalized understanding;Returned demonstration       PT Short Term Goals - 12/09/17 1018      PT SHORT TERM GOAL #1   Title  ind with initial HEP    Status  Achieved        PT Long Term Goals - 12/09/17 1019      PT LONG TERM GOAL #1   Title  Patient will demonstrate proper technique with home program for core and hip strengthening     Status  On-going      PT  LONG TERM GOAL #2   Title  Pt will report ability to go to the store and manage urge to void without leakage or feeling like she will leak before getting to the bathroom    Baseline  I have been able to control the urge much better using the techniques and able to walk to the bathroom    Status  On-going      PT LONG TERM GOAL #3   Title  Pt will be able to make one full weak without leakage    Baseline  I haven't leaked since last visit    Status  On-going      PT LONG TERM GOAL #4   Title  Pt will be able to cough and sneeze without leakage due to ability to perform knack technique    Status  On-going            Plan - 12/09/17 1034    Clinical Impression Statement  Pt is making progress towards goals.  She is doing well on her own with initial HEP and is able to advance exercises at this time. She is demonstrating functional improvements with controlling urge to void  Pt continues to have low endurance and demonstrates fatigue and needs cues for breathing during exercises.  She will benefit from  skilled PT to progress strengh and endurance during functional movements for improved self care activities.    PT Treatment/Interventions  ADLs/Self Care Home Management;Biofeedback;Therapeutic activities;Therapeutic exercise;Neuromuscular re-education;Patient/family education;Electrical Stimulation;Cryotherapy;Moist Heat;Manual techniques;Passive range of motion;Dry needling;Taping    PT Next Visit Plan  progress strength in different positions, biofeedback, hip abduction strength    PT Home Exercise Plan   Access Code: [email or web address]     Recommended Other Services  order signed    Consulted and Agree with Plan of Care  Patient       Patient will benefit from skilled therapeutic intervention in order to improve the following deficits and impairments:  Pain, Postural dysfunction, Decreased endurance, Decreased strength, Hypomobility  Visit Diagnosis: Muscle weakness (generalized)  Other lack of coordination     Problem List Patient Active Problem List   Diagnosis Date Noted  . Primary osteoarthritis of left hip 09/29/2016  . Urinary incontinence 06/25/2016  . Lumbar radiculopathy 06/18/2016  . 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 Abnormality of gait 02/02/2013  . Nonallopathic lesion of lumbar region 04/14/2011  . OSTEOARTHRITIS, HIP, LEFT 11/14/2009  . METATARSALGIA 11/14/2009  . BUNION, RIGHT FOOT 11/14/2009  . HIP PAIN, LEFT 10/02/2009  . Pain in joint, lower leg 10/02/2009  . LOW BACK PAIN, CHRONIC 10/02/2009    Zannie Cove, PT 12/09/2017, 10:59 AM  [facility] [address] [place], [place], [address] Phone: [phone]   Fax:  [phone]  Name: Shelia [name] MRN: [id number] Date of Birth: 1940-08-03

## 2017-12-09 NOTE — Patient Instructions (Signed)
Access Code: [email or web address]  URL: [email or web address]  Date: 12/09/2017  Prepared by: Lovett Calender   Exercises  Quadruped Pelvic Floor Contraction with Opposite Arm and Leg Lift - 10 reps - 1 sets - 1x daily - 7x weekly  Seated Pelvic Floor Contraction on Swiss Ball - 10 reps - 1 sets - 5 sec hold - 1x daily - 7x weekly  Swiss Ball Knee Extension - 10 reps - 2 sets - 1x daily - 7x weekly  Sidelying Hip External Rotation in Abduction - 8 reps - 2 sets - 1x daily - 7x weekly  Clamshell - 10 reps - 2 sets - 1x daily - 7x weekly

## 2017-12-11 DIAGNOSIS — H2511 Age-related nuclear cataract, right eye: Secondary | ICD-10-CM | POA: Diagnosis not present

## 2017-12-14 ENCOUNTER — Ambulatory Visit: Payer: PPO | Admitting: Physical Therapy

## 2017-12-14 DIAGNOSIS — R278 Other lack of coordination: Secondary | ICD-10-CM

## 2017-12-14 DIAGNOSIS — M6281 Muscle weakness (generalized): Secondary | ICD-10-CM | POA: Diagnosis not present

## 2017-12-14 NOTE — Patient Instructions (Signed)
Access Code: [email or web address]  URL: [email or web address]  Date: 12/14/2017  Prepared by: Lovett Calender   Exercises  Quadruped Pelvic Floor Contraction with Opposite Arm and Leg Lift - 10 reps - 1 sets - 1x daily - 7x weekly  Seated Pelvic Floor Contraction on Swiss Ball - 10 reps - 1 sets - 5 sec hold - 1x daily - 7x weekly  Swiss Ball Knee Extension - 10 reps - 2 sets - 1x daily - 7x weekly  Sidelying Hip External Rotation in Abduction - 8 reps - 2 sets - 1x daily - 7x weekly  Clamshell - 10 reps - 2 sets - 1x daily - 7x weekly  Wall Slide with Posterior Pelvic Tilt - 10 reps - 1 sets - 1-2 second hold hold - 1x daily - 7x weekly    [facility] [address], [place], [place] [address] Phone # [phone] Fax [phone]

## 2017-12-14 NOTE — Therapy (Signed)
[facility] [address] [place], [place], [address] Phone: [phone]   Fax:  [phone]  Physical Therapy Treatment  Patient Details  Name: Shelia [name] MRN: [id number] Date of Birth: 1941-04-03 Referring Provider: [name], MD   Encounter Date: 12/14/2017  PT End of Session - 12/14/17 1456    Visit Number  5    Date for PT Re-Evaluation  01/28/18    PT Start Time  1452    PT Stop Time  1530    PT Time Calculation (min)  38 min    Activity Tolerance  Patient tolerated treatment well    Behavior During Therapy  [facility] for tasks assessed/performed       Past Medical History:  Diagnosis Date  . Anxiety   . Arthritis    "left knee; left hip; lower back; mild to moderate in right hip" (09/30/2016)  . Chronic lower back pain   . Gastritis   . Heart palpitations   . Hypercholesteremia   . Seasonal allergies     Past Surgical History:  Procedure Laterality Date  . COLONOSCOPY    . DILATION AND CURETTAGE OF UTERUS    . JOINT REPLACEMENT    . TONSILLECTOMY    . 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   Procedure: TOTAL HIP ARTHROPLASTY ANTERIOR APPROACH;  Surgeon: Dorna Leitz, MD;  Location: [place];  Service: Orthopedics;  Laterality: Left;    There were no vitals filed for this visit.  Subjective Assessment - 12/14/17 1604    Subjective  I am feeling good.  I still can't hold the contraction more than 3 seconds    Pertinent History  vaginal deliveries, chronic UTI, history of low back pain, left hip replacement    Patient Stated Goals  Not have leakage and feel like I can drink water when needed    Currently in Pain?  No/denies                       [facility] Adult PT Treatment/Exercise - 12/14/17 0001      Lumbar Exercises: Aerobic   Elliptical  4 min with pelvic floor contraction 10 steps of contracting, 10 steps rest      Lumbar  Exercises: Standing   Wall Slides  20 reps;1 second small ROM maintaining pelvic floor contraction    Other Standing Lumbar Exercises  hip abduction and extension - red band - 2 x 10 reps    Other Standing Lumbar Exercises  walking with sports cord - back 1 plate; forward 15#plate - 8 x each way with bracing             PT Education - 12/14/17 1530    Education provided  Yes    Education Details   Access Code: [email or web address]     Person(s) Educated  Patient    Methods  Explanation;Demonstration;Handout;Tactile cues    Comprehension  Verbalized understanding;Returned demonstration       PT Short Term Goals - 12/09/17 1018      PT SHORT TERM GOAL #1   Title  ind with initial HEP    Status  Achieved        PT Long Term Goals - 12/14/17 1515      PT LONG TERM GOAL #1   Title  Patient will demonstrate proper technique with home program for core and hip strengthening     Time  8  Period  Weeks    Status  On-going      PT LONG TERM GOAL #2   Title  Pt will report ability to go to the store and manage urge to void without leakage or feeling like she will leak before getting to the bathroom    Time  8    Period  Weeks    Status  On-going      PT LONG TERM GOAL #3   Title  Pt will be able to make one full weak without leakage    Baseline  I haven't leaked and sneezed a couple of times    Time  8    Period  Weeks    Status  Partially Met      PT LONG TERM GOAL #4   Title  Pt will be able to cough and sneeze without leakage due to ability to perform knack technique    Time  8    Period  Weeks    Status  Achieved            Plan - 12/14/17 1522    Clinical Impression Statement  Pt is doing well with exercises and is able to progress to standing exercises during PT session.  She is feeling the contraction but only able to hold contraction for 1-3 seconds.  Pt monitored for technique and knee/hip alignment. Pt will benefit from skilled PT to progress exercises for  improved postural and bladder control    PT Treatment/Interventions  ADLs/Self Care Home Management;Biofeedback;Therapeutic activities;Therapeutic exercise;Neuromuscular re-education;Patient/family education;Electrical Stimulation;Cryotherapy;Moist Heat;Manual techniques;Passive range of motion;Dry needling;Taping    PT Next Visit Plan  progress strength in different positions, biofeedback, hip abduction strength    PT Home Exercise Plan   Access Code: [email or web address]     Consulted and Agree with Plan of Care  Patient       Patient will benefit from skilled therapeutic intervention in order to improve the following deficits and impairments:  Pain, Postural dysfunction, Decreased endurance, Decreased strength, Hypomobility  Visit Diagnosis: Muscle weakness (generalized)  Other lack of coordination     Problem List Patient Active Problem List   Diagnosis Date Noted  . Primary osteoarthritis of left hip 09/29/2016  . Urinary incontinence 06/25/2016  . Lumbar radiculopathy 06/18/2016  . 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 Abnormality of gait 02/02/2013  . Nonallopathic lesion of lumbar region 04/14/2011  . OSTEOARTHRITIS, HIP, LEFT 11/14/2009  . METATARSALGIA 11/14/2009  . BUNION, RIGHT FOOT 11/14/2009  . HIP PAIN, LEFT 10/02/2009  . Pain in joint, lower leg 10/02/2009  . LOW BACK PAIN, CHRONIC 10/02/2009    Zannie Cove, PT 12/14/2017, 4:07 PM  [facility] [address], [place], [place], [address] Phone: [phone]   Fax:  [phone]  Name: Shelia [name] MRN: [id number] Date of Birth: 09-06-1940

## 2017-12-16 ENCOUNTER — Encounter: Payer: PPO | Admitting: Physical Therapy

## 2017-12-17 ENCOUNTER — Encounter: Payer: PPO | Admitting: Physical Therapy

## 2017-12-24 ENCOUNTER — Ambulatory Visit: Payer: PPO | Admitting: Physical Therapy

## 2017-12-24 ENCOUNTER — Encounter: Payer: Self-pay | Admitting: Physical Therapy

## 2017-12-24 DIAGNOSIS — M6281 Muscle weakness (generalized): Secondary | ICD-10-CM

## 2017-12-24 DIAGNOSIS — R278 Other lack of coordination: Secondary | ICD-10-CM

## 2017-12-24 NOTE — Therapy (Signed)
[facility] [address], [place], [place], [address] Phone: [phone]   Fax:  [phone]  Physical Therapy Treatment  Patient Details  Name: Shelia [name] MRN: [id number] Date of Birth: 03-06-1941 Referring Provider: [name], MD   Encounter Date: 12/24/2017  PT End of Session - 12/24/17 0933    Visit Number  6    Date for PT Re-Evaluation  01/28/18    PT Start Time  0933    PT Stop Time  1013    PT Time Calculation (min)  40 min    Activity Tolerance  Patient tolerated treatment well    Behavior During Therapy  [facility] for tasks assessed/performed       Past Medical History:  Diagnosis Date  . Anxiety   . Arthritis    "left knee; left hip; lower back; mild to moderate in right hip" (09/30/2016)  . Chronic lower back pain   . Gastritis   . Heart palpitations   . Hypercholesteremia   . Seasonal allergies     Past Surgical History:  Procedure Laterality Date  . COLONOSCOPY    . DILATION AND CURETTAGE OF UTERUS    . JOINT REPLACEMENT    . TONSILLECTOMY    . 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   Procedure: TOTAL HIP ARTHROPLASTY ANTERIOR APPROACH;  Surgeon: Dorna Leitz, MD;  Location: [place];  Service: Orthopedics;  Laterality: Left;    There were no vitals filed for this visit.  Subjective Assessment - 12/24/17 0943    Subjective  I had one tiny leak with urgency.    Pertinent History  vaginal deliveries, chronic UTI, history of low back pain, left hip replacement    Patient Stated Goals  Not have leakage and feel like I can drink water when needed    Currently in Pain?  No/denies                       [facility] Adult PT Treatment/Exercise - 12/24/17 0001      Neuro Re-ed    Neuro Re-ed Details   pelvic floor and TrA bracing      Lumbar Exercises: Aerobic   Elliptical  3 min fwd/ 3 min back with PF and TrA brace      Lumbar  Exercises: Machines for Strengthening   Leg Press  single leg 45# 2x10 with bracing      Lumbar Exercises: Standing   Wall Slides  20 reps;1 second;Limitations small ROM maintaining pelvic floor contraction,     Wall Slides Limitations  hip abduction with yellow band    Row  Strengthening;Both;20 reps;Theraband    Theraband Level (Row)  Level 2 (Red)    Other Standing Lumbar Exercises  hip abduction and extension - red band - 2 x 10 reps       focus on exhale on exertion throughout session         PT Short Term Goals - 12/09/17 1018      PT SHORT TERM GOAL #1   Title  ind with initial HEP    Status  Achieved        PT Long Term Goals - 12/24/17 5916      PT LONG TERM GOAL #1   Title  Patient will demonstrate proper technique with home program for core and hip strengthening     Time  8    Period  Weeks  Status  On-going            Plan - 12/24/17 0175    Clinical Impression Statement  Patient was able to progress core strength with standing band exercises.  She needed cues for breathing and exhale on exertion with exercises.  Pt is still unsure of how much she is holding the contraction when doing the squat position.  She will benefit from skilled PT to progress strengthening in functional positions.    PT Treatment/Interventions  ADLs/Self Care Home Management;Biofeedback;Therapeutic activities;Therapeutic exercise;Neuromuscular re-education;Patient/family education;Electrical Stimulation;Cryotherapy;Moist Heat;Manual techniques;Passive range of motion;Dry needling;Taping    PT Next Visit Plan  discharge likely today, biofeedback during standing exercises and squats, progress strength in different positions, hip abduction strength, posture strength    PT Home Exercise Plan   Access Code: [email or web address]     Consulted and Agree with Plan of Care  Patient       Patient will benefit from skilled therapeutic intervention in order to improve the following deficits and  impairments:  Pain, Postural dysfunction, Decreased endurance, Decreased strength, Hypomobility  Visit Diagnosis: Other lack of coordination  Muscle weakness (generalized)     Problem List Patient Active Problem List   Diagnosis Date Noted  . Primary osteoarthritis of left hip 09/29/2016  . Urinary incontinence 06/25/2016  . Lumbar radiculopathy 06/18/2016  . 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 Abnormality of gait 02/02/2013  . Nonallopathic lesion of lumbar region 04/14/2011  . OSTEOARTHRITIS, HIP, LEFT 11/14/2009  . METATARSALGIA 11/14/2009  . BUNION, RIGHT FOOT 11/14/2009  . HIP PAIN, LEFT 10/02/2009  . Pain in joint, lower leg 10/02/2009  . LOW BACK PAIN, CHRONIC 10/02/2009    Zannie Cove, PT 12/24/2017, 12:38 PM  [facility] [address] [place], [place], [address] Phone: [phone]   Fax:  [phone]  Name: Shelia [name] MRN: [id number] Date of Birth: Aug 11, 1940

## 2017-12-25 DIAGNOSIS — S0181XA Laceration without foreign body of other part of head, initial encounter: Secondary | ICD-10-CM | POA: Diagnosis not present

## 2017-12-25 DIAGNOSIS — S0990XA Unspecified injury of head, initial encounter: Secondary | ICD-10-CM | POA: Diagnosis not present

## 2018-01-04 ENCOUNTER — Encounter: Payer: PPO | Admitting: Physical Therapy

## 2018-01-04 DIAGNOSIS — H2512 Age-related nuclear cataract, left eye: Secondary | ICD-10-CM | POA: Diagnosis not present

## 2018-01-05 DIAGNOSIS — H2512 Age-related nuclear cataract, left eye: Secondary | ICD-10-CM | POA: Diagnosis not present

## 2018-01-11 ENCOUNTER — Ambulatory Visit
Admission: RE | Admit: 2018-01-11 | Discharge: 2018-01-11 | Disposition: A | Discharge status: Home / Self Care | Payer: PPO | Source: Ambulatory Visit | Attending: Family Medicine | Admitting: Family Medicine

## 2018-01-11 DIAGNOSIS — Z1231 Encounter for screening mammogram for malignant neoplasm of breast: Secondary | ICD-10-CM

## 2018-01-13 ENCOUNTER — Encounter: Payer: PPO | Admitting: Physical Therapy

## 2018-01-18 ENCOUNTER — Encounter: Payer: Self-pay | Admitting: Physical Therapy

## 2018-01-18 ENCOUNTER — Ambulatory Visit: Payer: PPO | Attending: Family Medicine | Admitting: Physical Therapy

## 2018-01-18 DIAGNOSIS — R278 Other lack of coordination: Secondary | ICD-10-CM

## 2018-01-18 DIAGNOSIS — M6281 Muscle weakness (generalized): Secondary | ICD-10-CM | POA: Diagnosis not present

## 2018-01-18 NOTE — Patient Instructions (Signed)
Access Code: [email or web address]  URL: [email or web address]  Date: 01/18/2018  Prepared by: Lovett Calender   Exercises  Quadruped Pelvic Floor Contraction with Opposite Arm and Leg Lift - 10 reps - 1 sets - 1x daily - 7x weekly  Seated Pelvic Floor Contraction on Swiss Ball - 10 reps - 1 sets - 5 sec hold - 1x daily - 7x weekly  Swiss Ball Knee Extension - 10 reps - 2 sets - 1x daily - 7x weekly  Sidelying Hip External Rotation in Abduction - 8 reps - 2 sets - 1x daily - 7x weekly  Clamshell - 10 reps - 2 sets - 1x daily - 7x weekly  Wall Slide with Posterior Pelvic Tilt - 10 reps - 1 sets - 1-2 second hold hold - 1x daily - 7x weekly  Standing Hamstring Stretch on Chair - 10 reps - 3 sets - 1x daily - 7x weekly  Seated Figure 4 Piriformis Stretch - 10 reps - 3 sets - 1x daily - 7x weekly  Sit to Stand with Pelvic Floor Contraction - 10 reps - 1 sets - 3x daily - 7x weekly  Seated Pelvic Floor Contraction with Hip Abduction and Resistance Loop - 10 reps - 1 sets - 5 seconds hold - 3x daily - 7x weekly

## 2018-01-18 NOTE — Therapy (Signed)
[facility] [address], [address] [place], [place], [address] Phone: [phone]   Fax:  [phone]  Physical Therapy Treatment  Patient Details  Name: Shelia [name] MRN: [id number] Date of Birth: 02/27/1941 Referring Provider: Griffin, Elaine, MD   Encounter Date: 01/18/2018  PT End of Session - 01/18/18 1448    Visit Number  7    Date for PT Re-Evaluation  02/15/18    PT Start Time  1448    PT Stop Time  1530    PT Time Calculation (min)  42 min    Activity Tolerance  Patient tolerated treatment well    Behavior During Therapy  WFL for tasks assessed/performed       Past Medical History:  Diagnosis Date  . Anxiety   . Arthritis    "left knee; left hip; lower back; mild to moderate in right hip" (09/30/2016)  . Chronic lower back pain   . Gastritis   . Heart palpitations   . Hypercholesteremia   . Seasonal allergies     Past Surgical History:  Procedure Laterality Date  . COLONOSCOPY    . DILATION AND CURETTAGE OF UTERUS    . JOINT REPLACEMENT    . TONSILLECTOMY    . 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   Procedure: TOTAL HIP ARTHROPLASTY ANTERIOR APPROACH;  Surgeon: Graves, John, MD;  [facility];  Service: Orthopedics;  Laterality: Left;    There were no vitals filed for this visit.  Subjective Assessment - 01/18/18 1456    Subjective  I was in the bathroom and didn't really have to go but a little leaked out.  I went after that and my bladder was mostly empty.  I am not sure why that happened.    Pertinent History  vaginal deliveries, chronic UTI, history of low back pain, left hip replacement    Currently in Pain?  No/denies                    Pelvic Floor Special Questions - 01/18/18 0001    Strength  weak squeeze, no lift    Strength # of reps  3    Strength # of seconds  10        [facility] Adult PT Treatment/Exercise - 01/18/18 0001       Neuro Re-ed    Neuro Re-ed Details   biofeedback with exercises; able to demonstrate 15-20mV; holding 10 sec with bridge as detailed below      Lumbar Exercises: Seated   Sit to Stand  20 reps 10x with yellow band; 5x with ball squeeze    Other Seated Lumbar Exercises  seated pelvic floor contraction 3 x 3 sec; clam and ball squeeze 20x each       Lumbar Exercises: Supine   Bridge  10 reps;3 seconds 6 reps with 10 sec hold             PT Education - 01/18/18 1534    Education provided  Yes    Education Details   Access Code: [email or web address]     Person(s) Educated  Patient    Methods  Explanation;Demonstration;Handout;Verbal cues;Tactile cues    Comprehension  Verbalized understanding;Returned demonstration       PT Short Term Goals - 12/09/17 1018      PT SHORT TERM GOAL #1   Title  ind with initial HEP    Status  Achieved          PT Long Term Goals - 01/18/18 1514      PT LONG TERM GOAL #1   Title  Patient will demonstrate proper technique with home program for core and hip strengthening     Time  4    Period  Weeks    Status  On-going    Target Date  02/15/18      PT LONG TERM GOAL #2   Title  Pt will report ability to go to the store and manage urge to void without leakage or feeling like she will leak before getting to the bathroom    Baseline  had one episode of leakage    Time  4    Period  Weeks    Status  On-going    Target Date  02/15/18      PT LONG TERM GOAL #3   Title  Pt will be able to make one full weak without leakage    Baseline  I have had a couple of weeks, then had leakage a couple time (wasn't doing my exercises as much)    Time  4    Period  Weeks    Status  Partially Met    Target Date  02/15/18      PT LONG TERM GOAL #4   Title  Pt will be able to cough and sneeze without leakage due to ability to perform knack technique    Status  Achieved      PT LONG TERM GOAL #5   Title  Pt will be able to demonstrate pelvic floor  contraction in standing of at least 36m with biofeedback for greater control when walking/standing.    Time  4    Period  Weeks    Status  New    Target Date  02/15/18            Plan - 01/18/18 1535    Clinical Impression Statement  Pt demonstrates improved pelvic floor contraction with hip abduction.  Pt was able to engage pelvic floor correctly and had decreased medial knee collapse today.  Pt is demonstrating progress.  She has had limited visits due to travel and eye surgery.  Pt will benefit from continued skilled PT to progress strength of pelvic floor to standing positions.  Pt     PT Frequency  1x / week    PT Duration  4 weeks    PT Treatment/Interventions  ADLs/Self Care Home Management;Biofeedback;Therapeutic activities;Therapeutic exercise;Neuromuscular re-education;Patient/family education;Electrical Stimulation;Cryotherapy;Moist Heat;Manual techniques;Passive range of motion;Dry needling;Taping    PT Next Visit Plan  progress exercises to sitting, standing and squatting with biofeedback    PT Home Exercise Plan   Access Code: [email or web address]     Consulted and Agree with Plan of Care  Patient       Patient will benefit from skilled therapeutic intervention in order to improve the following deficits and impairments:  Pain, Postural dysfunction, Decreased endurance, Decreased strength, Hypomobility  Visit Diagnosis: Other lack of coordination - Plan: PT plan of care cert/re-cert  Muscle weakness (generalized) - Plan: PT plan of care cert/re-cert     Problem List Patient Active Problem List   Diagnosis Date Noted  . Primary osteoarthritis of left hip 09/29/2016  . Urinary incontinence 06/25/2016  . Lumbar radiculopathy 06/18/2016  . 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 Abnormality of gait 02/02/2013  . Nonallopathic lesion of lumbar region 04/14/2011  . OSTEOARTHRITIS, HIP, LEFT 11/14/2009  . METATARSALGIA 11/14/2009  . BUNION, RIGHT FOOT 11/14/2009  .  HIP PAIN, LEFT  10/02/2009  . Pain in joint, lower leg 10/02/2009  . LOW BACK PAIN, CHRONIC 10/02/2009    Jakki Crosser, PT 01/18/2018, 4:01 PM  [facility] [address], [address] [place], [place], [address] Phone: [phone]   Fax:  [phone]  Name: Shelia [name] MRN: [id number] Date of Birth: 05/01/1941   

## 2018-02-05 ENCOUNTER — Ambulatory Visit: Payer: PPO | Attending: Family Medicine | Admitting: Physical Therapy

## 2018-02-05 ENCOUNTER — Encounter: Payer: Self-pay | Admitting: Physical Therapy

## 2018-02-05 DIAGNOSIS — R278 Other lack of coordination: Secondary | ICD-10-CM

## 2018-02-05 DIAGNOSIS — M6281 Muscle weakness (generalized): Secondary | ICD-10-CM | POA: Insufficient documentation

## 2018-02-05 NOTE — Therapy (Signed)
[facility] [address] [place], [place], [address] Phone: [phone]   Fax:  [phone]  Physical Therapy Treatment  Patient Details  Name: Shelia [name] MRN: [id number] Date of Birth: 16-Aug-1940 Referring Provider: [name], MD   Encounter Date: 02/05/2018  PT End of Session - 02/05/18 1114    Visit Number  8    Date for PT Re-Evaluation  02/15/18    PT Start Time  1109    PT Stop Time  1149    PT Time Calculation (min)  40 min    Activity Tolerance  Patient tolerated treatment well    Behavior During Therapy  [facility] for tasks assessed/performed       Past Medical History:  Diagnosis Date  . Anxiety   . Arthritis    "left knee; left hip; lower back; mild to moderate in right hip" (09/30/2016)  . Chronic lower back pain   . Gastritis   . Heart palpitations   . Hypercholesteremia   . Seasonal allergies     Past Surgical History:  Procedure Laterality Date  . COLONOSCOPY    . DILATION AND CURETTAGE OF UTERUS    . JOINT REPLACEMENT    . TONSILLECTOMY    . 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   Procedure: TOTAL HIP ARTHROPLASTY ANTERIOR APPROACH;  Surgeon: Dorna Leitz, MD;  Location: [facility];  Service: Orthopedics;  Laterality: Left;    There were no vitals filed for this visit.  Subjective Assessment - 02/05/18 1115    Subjective  I haven't had any big leaks.  I have tried to fit my exercises in while I was away.  I leaked 3x and there was no urge, I just leaked.    Pertinent History  vaginal deliveries, chronic UTI, history of low back pain, left hip replacement    Patient Stated Goals  Not have leakage and feel like I can drink water when needed    Currently in Pain?  No/denies                       [facility] Adult PT Treatment/Exercise - 02/05/18 0001      Self-Care   Self-Care  Other Self-Care Comments    Other Self-Care Comments    review and updated HEP      Neuro Re-ed    Neuro Re-ed Details   biofeedback with exercises; able to demonstrate 15-3m contraction during exercises      Lumbar Exercises: Standing   Other Standing Lumbar Exercises  marching with pelvic contraction      Lumbar Exercises: Seated   Other Seated Lumbar Exercises  seated contraction with march - 20x      Lumbar Exercises: Supine   Clam  10 reps red band    Bent Knee Raise  20 reps;4 seconds    Bridge  10 reps;4 seconds    Large Ball Abdominal Isometric  10 reps;4 seconds roll out LE on red ball               PT Short Term Goals - 12/09/17 1018      PT SHORT TERM GOAL #1   Title  ind with initial HEP    Status  Achieved        PT Long Term Goals - 01/18/18 1514      PT LONG TERM GOAL #1   Title  Patient will demonstrate proper technique  with home program for core and hip strengthening     Time  4    Period  Weeks    Status  On-going    Target Date  02/15/18      PT LONG TERM GOAL #2   Title  Pt will report ability to go to the store and manage urge to void without leakage or feeling like she will leak before getting to the bathroom    Baseline  had one episode of leakage    Time  4    Period  Weeks    Status  On-going    Target Date  02/15/18      PT LONG TERM GOAL #3   Title  Pt will be able to make one full weak without leakage    Baseline  I have had a couple of weeks, then had leakage a couple time (wasn't doing my exercises as much)    Time  4    Period  Weeks    Status  Partially Met    Target Date  02/15/18      PT LONG TERM GOAL #4   Title  Pt will be able to cough and sneeze without leakage due to ability to perform knack technique    Status  Achieved      PT LONG TERM GOAL #5   Title  Pt will be able to demonstrate pelvic floor contraction in standing of at least 73m with biofeedback for greater control when walking/standing.    Time  4    Period  Weeks    Status  New    Target Date   02/15/18            Plan - 02/05/18 1223    Clinical Impression Statement  Pt did well using biofeedback today.  She had difficulty going straight to exercises in sitting.  After performing supine exercises, she was able to transition to sitting posture with improved ability to engage pelvic floor.  Pt was educated on adding exercises in supine to warm up before sitting and standing.  Pt will benefit from PT for improved strength in standing and sitting posture for reduction of leakage    PT Treatment/Interventions  ADLs/Self Care Home Management;Biofeedback;Therapeutic activities;Therapeutic exercise;Neuromuscular re-education;Patient/family education;Electrical Stimulation;Cryotherapy;Moist Heat;Manual techniques;Passive range of motion;Dry needling;Taping    PT Next Visit Plan  re-assess goals, progress exercises to sitting, standing and squatting with or without biofeedback    PT Home Exercise Plan   Access Code: [email or web address]     Consulted and Agree with Plan of Care  Patient       Patient will benefit from skilled therapeutic intervention in order to improve the following deficits and impairments:  Pain, Postural dysfunction, Decreased endurance, Decreased strength, Hypomobility  Visit Diagnosis: Other lack of coordination  Muscle weakness (generalized)     Problem List Patient Active Problem List   Diagnosis Date Noted  . Primary osteoarthritis of left hip 09/29/2016  . Urinary incontinence 06/25/2016  . Lumbar radiculopathy 06/18/2016  . 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 Abnormality of gait 02/02/2013  . Nonallopathic lesion of lumbar region 04/14/2011  . OSTEOARTHRITIS, HIP, LEFT 11/14/2009  . METATARSALGIA 11/14/2009  . BUNION, RIGHT FOOT 11/14/2009  . HIP PAIN, LEFT 10/02/2009  . Pain in joint, lower leg 10/02/2009  . LOW BACK PAIN, CHRONIC 10/02/2009    JZannie Cove PT 02/05/2018, 12:46 PM  [facility] [address]  [address] Phone: [phone]   Fax:  [phone]  Name: Shelia [name] MRN: [id number] Date of Birth: [birth year]

## 2018-02-05 NOTE — Patient Instructions (Signed)
   [facility] [address], [address] [place], [place] [address] Phone # [phone] Fax [phone]  

## 2018-02-12 ENCOUNTER — Encounter: Payer: PPO | Admitting: Physical Therapy

## 2018-02-16 ENCOUNTER — Encounter: Payer: Self-pay | Admitting: Physical Therapy

## 2018-02-16 ENCOUNTER — Ambulatory Visit: Payer: PPO | Admitting: Physical Therapy

## 2018-02-16 DIAGNOSIS — R278 Other lack of coordination: Secondary | ICD-10-CM | POA: Diagnosis not present

## 2018-02-16 DIAGNOSIS — M6281 Muscle weakness (generalized): Secondary | ICD-10-CM

## 2018-02-16 NOTE — Therapy (Signed)
[facility] [address], [address] [place], [place], [address] Phone: [phone]   Fax:  [phone]  Physical Therapy Treatment  Patient Details  Name: Shelia [name] MRN: [id number] Date of Birth: 12/19/1940 Referring Provider: Griffin, Elaine, MD   Encounter Date: 02/16/2018  PT End of Session - 02/16/18 1413    Visit Number  9    Date for PT Re-Evaluation  02/15/18    PT Start Time  1110    PT Stop Time  1145    PT Time Calculation (min)  35 min    Activity Tolerance  Patient tolerated treatment well    Behavior During Therapy  WFL for tasks assessed/performed       Past Medical History:  Diagnosis Date  . Anxiety   . Arthritis    "left knee; left hip; lower back; mild to moderate in right hip" (09/30/2016)  . Chronic lower back pain   . Gastritis   . Heart palpitations   . Hypercholesteremia   . Seasonal allergies     Past Surgical History:  Procedure Laterality Date  . COLONOSCOPY    . DILATION AND CURETTAGE OF UTERUS    . JOINT REPLACEMENT    . TONSILLECTOMY    . 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   Procedure: TOTAL HIP ARTHROPLASTY ANTERIOR APPROACH;  Surgeon: Graves, John, MD;  [facility];  Service: Orthopedics;  Laterality: Left;    There were no vitals filed for this visit.  Subjective Assessment - 02/16/18 1411    Subjective  The exercises are going well.  I have been working out more    Patient Stated Goals  Not have leakage and feel like I can drink water when needed    Currently in Pain?  No/denies                       [facility] Adult PT Treatment/Exercise - 02/16/18 0001      Neuro Re-ed    Neuro Re-ed Details   biofeedback with exercises in HEP; able to demonstrate more than 20mV contraction during exercises      Lumbar Exercises: Aerobic   Nustep  L2 x 8 min PT present to review progress      Lumbar Exercises: Standing    Other Standing Lumbar Exercises  marching with pelvic contraction      Lumbar Exercises: Seated   Sit to Stand  20 reps               PT Short Term Goals - 12/09/17 1018      PT SHORT TERM GOAL #1   Title  ind with initial HEP    Status  Achieved        PT Long Term Goals - 02/16/18 1434      PT LONG TERM GOAL #1   Title  Patient will demonstrate proper technique with home program for core and hip strengthening     Status  Achieved      PT LONG TERM GOAL #2   Title  Pt will report ability to go to the store and manage urge to void without leakage or feeling like she will leak before getting to the bathroom    Baseline  able to manage the urge and no issues at the store    Status  Achieved      PT LONG TERM GOAL #3   Title  Pt   will be able to make one full weak without leakage    Baseline  haven't had any noticeable     Status  Achieved      PT LONG TERM GOAL #4   Title  Pt will be able to cough and sneeze without leakage due to ability to perform knack technique    Status  Achieved      PT LONG TERM GOAL #5   Title  Pt will be able to demonstrate pelvic floor contraction in standing of at least 10mV with biofeedback for greater control when walking/standing.    Baseline  able to consistently contract 30-40mV in standing during session today    Status  Achieved            Plan - 02/16/18 1418    Clinical Impression Statement  Pt is doing well with her HEP.  She was able to engage pelvic floor in standing up to     PT Treatment/Interventions  ADLs/Self Care Home Management;Biofeedback;Therapeutic activities;Therapeutic exercise;Neuromuscular re-education;Patient/family education;Electrical Stimulation;Cryotherapy;Moist Heat;Manual techniques;Passive range of motion;Dry needling;Taping    PT Next Visit Plan  discharged today    PT Home Exercise Plan   Access Code: [email or web address]     Consulted and Agree with Plan of Care  Patient       Patient will benefit  from skilled therapeutic intervention in order to improve the following deficits and impairments:  Pain, Postural dysfunction, Decreased endurance, Decreased strength, Hypomobility  Visit Diagnosis: Other lack of coordination  Muscle weakness (generalized)     Problem List Patient Active Problem List   Diagnosis Date Noted  . Primary osteoarthritis of left hip 09/29/2016  . Urinary incontinence 06/25/2016  . Lumbar radiculopathy 06/18/2016  . 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 Abnormality of gait 02/02/2013  . Nonallopathic lesion of lumbar region 04/14/2011  . OSTEOARTHRITIS, HIP, LEFT 11/14/2009  . METATARSALGIA 11/14/2009  . BUNION, RIGHT FOOT 11/14/2009  . HIP PAIN, LEFT 10/02/2009  . Pain in joint, lower leg 10/02/2009  . LOW BACK PAIN, CHRONIC 10/02/2009    Jakki Crosser, PT 02/16/2018, 2:55 PM  [facility] [address], [address] [place], [place], [address] Phone: [phone]   Fax:  [phone]  Name: Shelia [name] MRN: [id number] Date of Birth: 01/27/1941  PHYSICAL THERAPY DISCHARGE SUMMARY  Visits from Start of Care: 9  Current functional level related to goals / functional outcomes: See above   Remaining deficits: See above   Education / Equipment: HEP  Plan: Patient agrees to discharge.  Patient goals were met. Patient is being discharged due to meeting the stated rehab goals.  ?????    Jakki Crosser, PT 02/16/18 2:57 PM   

## 2018-03-04 IMAGING — MR MR LUMBAR SPINE W/O CM
5 series · 43 of 48 positions shown · non-contrast
Comparison: Plain films 06/18/2016.  MRI lumbar spine 11/08/2014.

CLINICAL DATA: Low back and RIGHT buttock pain. RIGHT hip pain. L5
radicular symptoms. Urinary incontinence.

EXAM:
MRI LUMBAR SPINE WITHOUT CONTRAST
TECHNIQUE: Multiplanar, multisequence MR imaging of the lumbar spine was
performed. No intravenous contrast was administered.

[Series 3: tirm sag · sagittal · 4.0mm · 0.55mm/px · 6 of 13 slices shown]
[im 1/13]
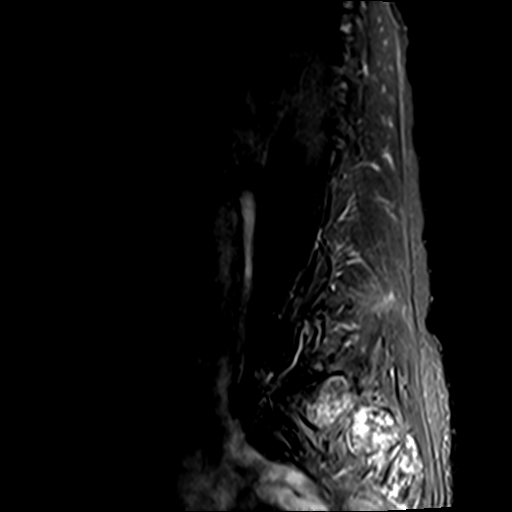
[im 3/13]
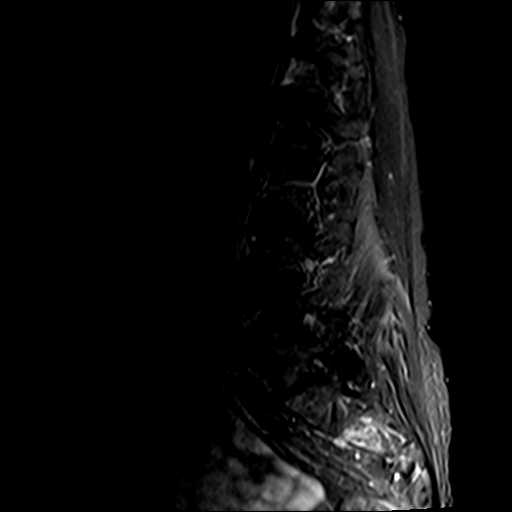
[im 5/13]
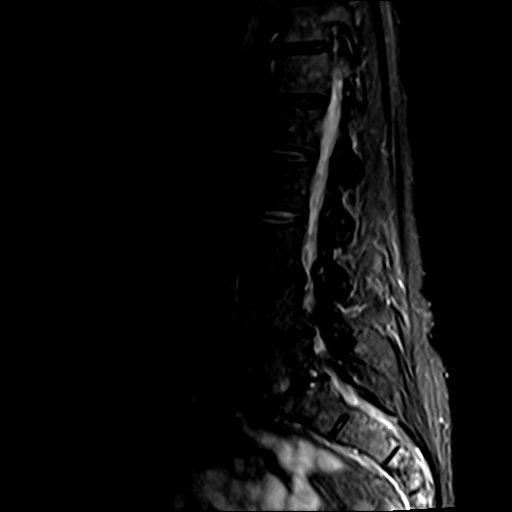
[im 8/13]
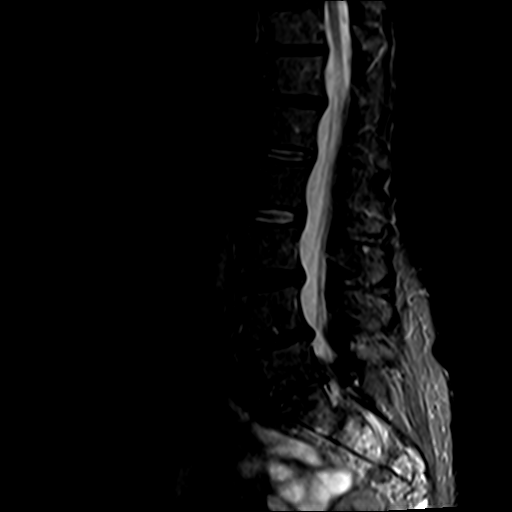
[im 10/13]
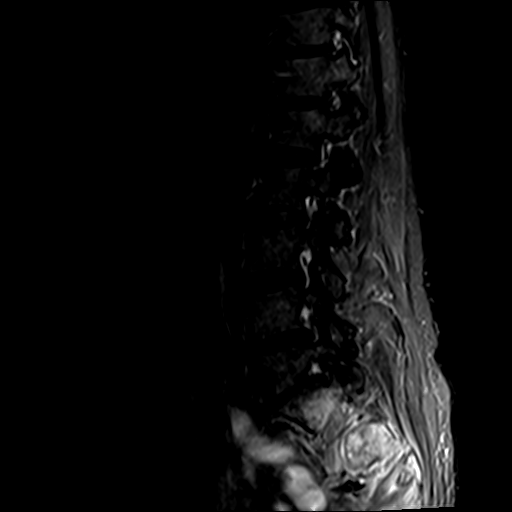
[im 13/13]
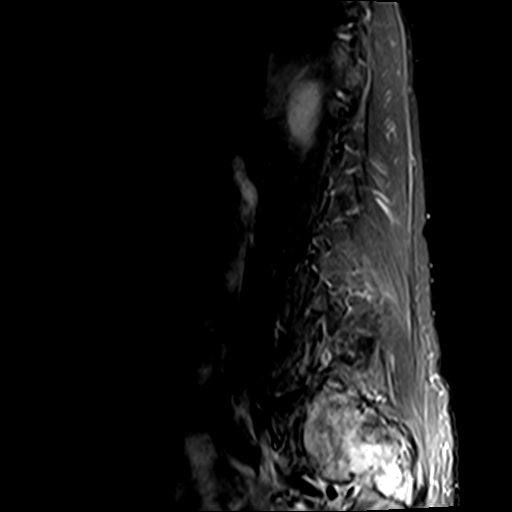

[Series 4: T2 · sagittal · 4.0mm · 0.88mm/px · 6 of 13 slices shown (1 of 2)]
[im 1/13]
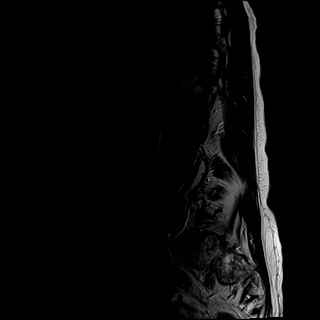
[im 3/13]
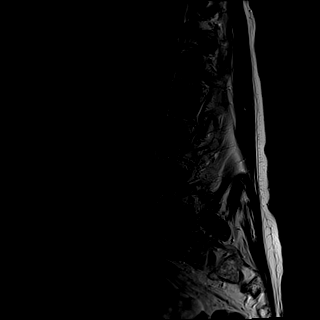
[im 5/13]
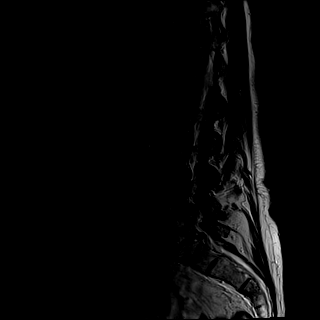
[im 8/13]
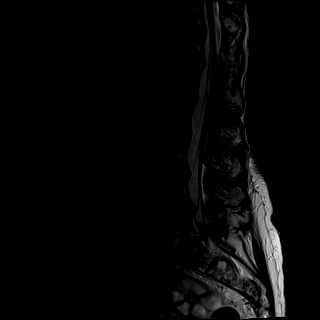
[im 10/13]
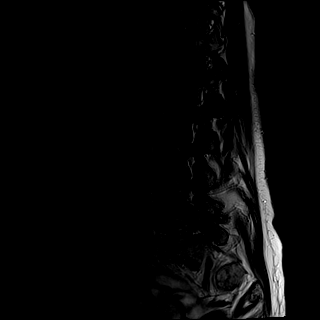
[im 13/13]
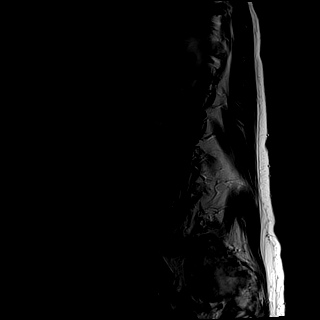

[Series 5: T1 · sagittal · 4.0mm · 0.88mm/px · 6 of 13 slices shown (1 of 2)]
[im 1/13]
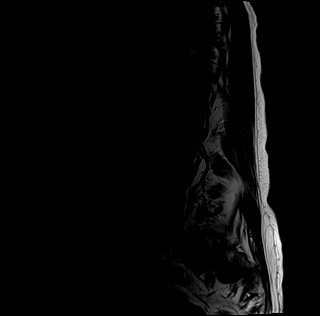
[im 3/13]
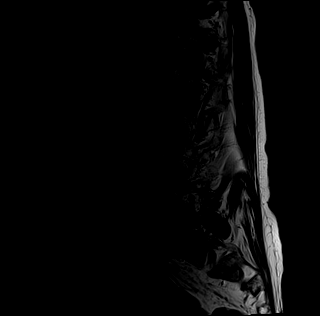
[im 5/13]
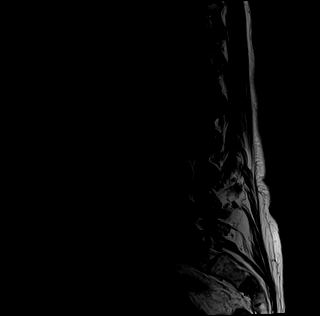
[im 8/13]
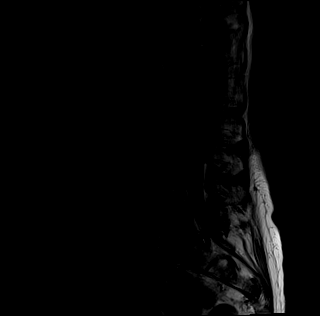
[im 10/13]
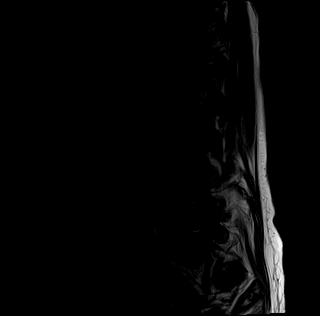
[im 13/13]
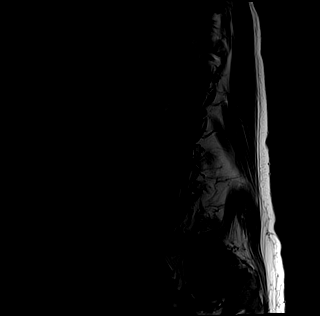

[Series 6: T1 · axial · 4.0mm · 0.78mm/px · z∈[-41,+138]mm · 10 of 32 slices shown (2 of 2)]
[im 1/32]
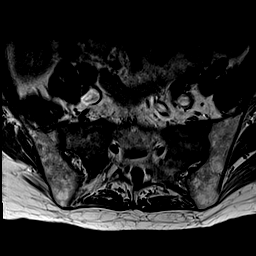
[im 3/32]
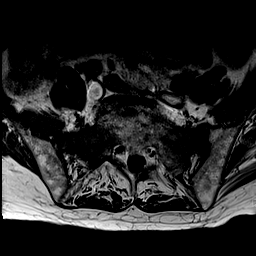
[im 5/32]
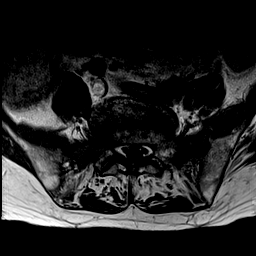
[im 9/32]
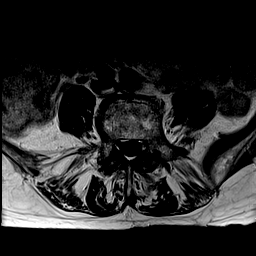
[im 14/32]
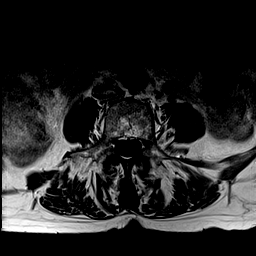
[im 16/32]
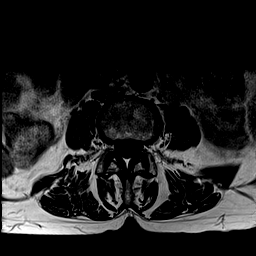
[im 18/32]
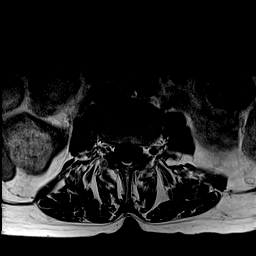
[im 23/32]
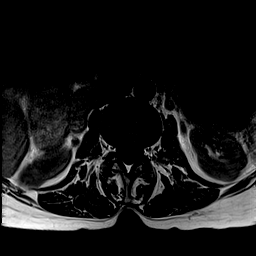
[im 27/32]
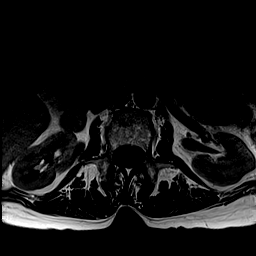
[im 32/32]
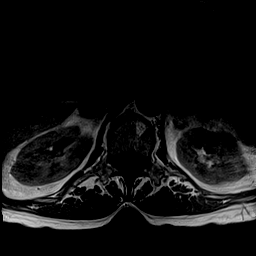

[Series 8: T2 · axial · 4.0mm · 0.78mm/px · z∈[-41,+138]mm · 15 of 32 slices shown (2 of 2)]
[im 1/32]
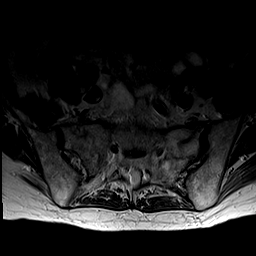
[im 3/32]
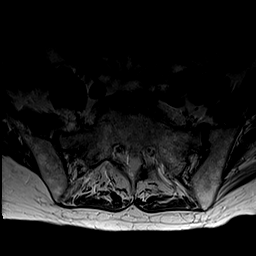
[im 5/32]
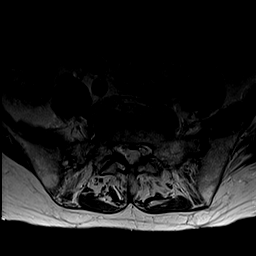
[im 7/32]
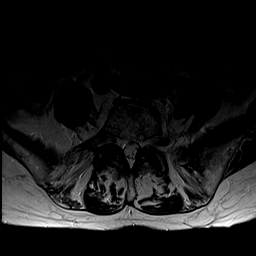
[im 9/32]
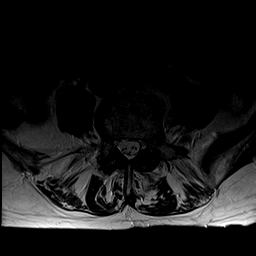
[im 12/32]
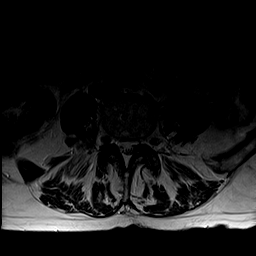
[im 14/32]
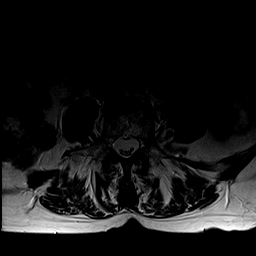
[im 16/32]
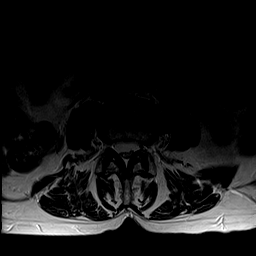
[im 18/32]
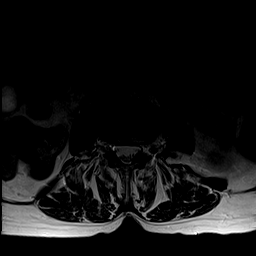
[im 20/32]
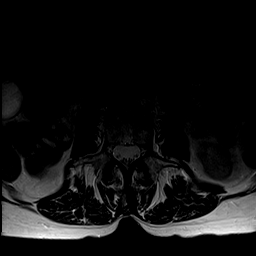
[im 23/32]
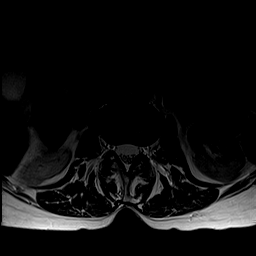
[im 25/32]
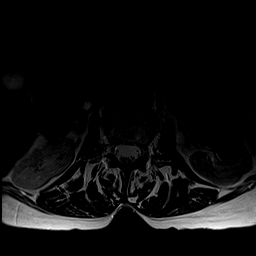
[im 27/32]
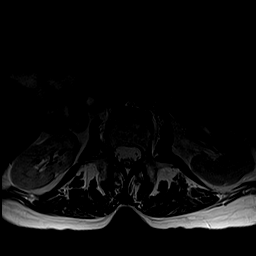
[im 29/32]
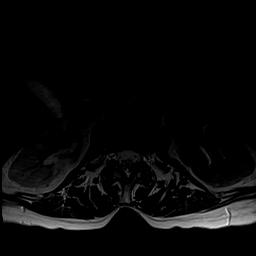
[im 32/32]
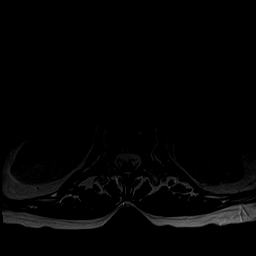

[43 of 48 positions shown; findings below may reference images not displayed]

FINDINGS: Segmentation:  Standard.

Alignment:  Physiologic.

Vertebrae:  No fracture, evidence of discitis, or bone lesion.

Conus medullaris: Extends to the L1-L2 level and appears normal.

Paraspinal and other soft tissues: Negative.

Disc levels:

Minor disc pathology at T11-12 and T12-L1.

L1-L2:  Normal.

L2-L3:  Normal.

L3-L4: Disc desiccation. Central protrusion. Posterior element
hypertrophy. No definite impingement.

L4-L5: Advanced disc space narrowing. Central protrusion. Posterior
element hypertrophy. Moderate stenosis. BILATERAL subarticular zone
narrowing potentially compresses the L5 nerve roots. Mild foraminal
narrowing does not clearly resulting in L4 nerve root impingement.

L5-S1: Near complete loss of interspace height. Osseous ridging with
central protrusion. Posterior element hypertrophy. Mild stenosis.
Mild-to-moderate foraminal narrowing, worse on the RIGHT, could
affect the L5 nerve roots.
IMPRESSION: No areas of critical spinal stenosis or dramatic interval change
from 9754 to suggest a cause for recent urinary incontinence.

Lumbar degenerative disc disease at L3-4, L4-5, and L5-S1 appears
fairly similar to priors.

Moderate stenosis at L4-5 is the dominant finding but not clearly
rightward predominant.

Foraminal narrowing at L5-S1 on the RIGHT is related to a
combination of severe loss of interspace height, posterior element
hypertrophy, and disc material. Potentially the RIGHT L5 nerve root
is not risk although the findings are not significantly worse from

## 2018-03-05 DIAGNOSIS — Z1211 Encounter for screening for malignant neoplasm of colon: Secondary | ICD-10-CM | POA: Diagnosis not present

## 2018-03-05 DIAGNOSIS — Z800 Family history of malignant neoplasm of digestive organs: Secondary | ICD-10-CM | POA: Diagnosis not present

## 2018-06-10 DIAGNOSIS — H01022 Squamous blepharitis right lower eyelid: Secondary | ICD-10-CM | POA: Diagnosis not present

## 2018-06-10 DIAGNOSIS — H01025 Squamous blepharitis left lower eyelid: Secondary | ICD-10-CM | POA: Diagnosis not present

## 2018-06-10 DIAGNOSIS — H16223 Keratoconjunctivitis sicca, not specified as Sjogren's, bilateral: Secondary | ICD-10-CM | POA: Diagnosis not present

## 2018-08-02 DIAGNOSIS — Z6822 Body mass index (BMI) 22.0-22.9, adult: Secondary | ICD-10-CM | POA: Diagnosis not present

## 2018-08-02 DIAGNOSIS — Z01419 Encounter for gynecological examination (general) (routine) without abnormal findings: Secondary | ICD-10-CM | POA: Diagnosis not present

## 2018-11-30 DIAGNOSIS — M2041 Other hammer toe(s) (acquired), right foot: Secondary | ICD-10-CM | POA: Diagnosis not present

## 2018-11-30 DIAGNOSIS — M21612 Bunion of left foot: Secondary | ICD-10-CM | POA: Diagnosis not present

## 2018-11-30 DIAGNOSIS — M2042 Other hammer toe(s) (acquired), left foot: Secondary | ICD-10-CM | POA: Diagnosis not present

## 2018-11-30 DIAGNOSIS — M21611 Bunion of right foot: Secondary | ICD-10-CM | POA: Diagnosis not present

## 2018-12-07 DIAGNOSIS — Z1389 Encounter for screening for other disorder: Secondary | ICD-10-CM | POA: Diagnosis not present

## 2018-12-07 DIAGNOSIS — M1990 Unspecified osteoarthritis, unspecified site: Secondary | ICD-10-CM | POA: Diagnosis not present

## 2018-12-07 DIAGNOSIS — J309 Allergic rhinitis, unspecified: Secondary | ICD-10-CM | POA: Diagnosis not present

## 2018-12-07 DIAGNOSIS — E7800 Pure hypercholesterolemia, unspecified: Secondary | ICD-10-CM | POA: Diagnosis not present

## 2018-12-07 DIAGNOSIS — M859 Disorder of bone density and structure, unspecified: Secondary | ICD-10-CM | POA: Diagnosis not present

## 2018-12-07 DIAGNOSIS — Z0000 Encounter for general adult medical examination without abnormal findings: Secondary | ICD-10-CM | POA: Diagnosis not present

## 2018-12-13 DIAGNOSIS — Z0000 Encounter for general adult medical examination without abnormal findings: Secondary | ICD-10-CM | POA: Diagnosis not present

## 2018-12-13 DIAGNOSIS — E7800 Pure hypercholesterolemia, unspecified: Secondary | ICD-10-CM | POA: Diagnosis not present

## 2018-12-28 DIAGNOSIS — N958 Other specified menopausal and perimenopausal disorders: Secondary | ICD-10-CM | POA: Diagnosis not present

## 2019-01-19 DIAGNOSIS — Z1231 Encounter for screening mammogram for malignant neoplasm of breast: Secondary | ICD-10-CM | POA: Diagnosis not present

## 2019-06-16 DIAGNOSIS — H16223 Keratoconjunctivitis sicca, not specified as Sjogren's, bilateral: Secondary | ICD-10-CM | POA: Diagnosis not present

## 2019-06-16 DIAGNOSIS — H01025 Squamous blepharitis left lower eyelid: Secondary | ICD-10-CM | POA: Diagnosis not present

## 2019-06-16 DIAGNOSIS — H01022 Squamous blepharitis right lower eyelid: Secondary | ICD-10-CM | POA: Diagnosis not present

## 2019-06-21 ENCOUNTER — Other Ambulatory Visit: Payer: Self-pay

## 2019-06-21 DIAGNOSIS — Z20822 Contact with and (suspected) exposure to covid-19: Secondary | ICD-10-CM

## 2019-06-22 LAB — NOVEL CORONAVIRUS, NAA: SARS-CoV-2, NAA: NOT DETECTED

## 2019-07-15 ENCOUNTER — Ambulatory Visit: Payer: PPO | Attending: Internal Medicine

## 2019-07-15 DIAGNOSIS — Z20828 Contact with and (suspected) exposure to other viral communicable diseases: Secondary | ICD-10-CM | POA: Diagnosis not present

## 2019-07-15 DIAGNOSIS — Z20822 Contact with and (suspected) exposure to covid-19: Secondary | ICD-10-CM

## 2019-07-16 LAB — NOVEL CORONAVIRUS, NAA: SARS-CoV-2, NAA: NOT DETECTED

## 2019-07-29 DIAGNOSIS — Z9289 Personal history of other medical treatment: Secondary | ICD-10-CM

## 2019-07-29 HISTORY — DX: Personal history of other medical treatment: Z92.89

## 2019-08-10 DIAGNOSIS — Z01419 Encounter for gynecological examination (general) (routine) without abnormal findings: Secondary | ICD-10-CM | POA: Diagnosis not present

## 2019-08-10 DIAGNOSIS — N951 Menopausal and female climacteric states: Secondary | ICD-10-CM | POA: Diagnosis not present

## 2019-08-10 DIAGNOSIS — Z6821 Body mass index (BMI) 21.0-21.9, adult: Secondary | ICD-10-CM | POA: Diagnosis not present

## 2019-10-13 DIAGNOSIS — I4891 Unspecified atrial fibrillation: Secondary | ICD-10-CM | POA: Diagnosis not present

## 2019-10-13 DIAGNOSIS — R0609 Other forms of dyspnea: Secondary | ICD-10-CM | POA: Diagnosis not present

## 2019-10-20 ENCOUNTER — Ambulatory Visit: Payer: PPO | Admitting: Cardiology

## 2019-10-20 NOTE — Progress Notes (Signed)
[name], MD Reason for referral-atrial fibrillation  HPI: 79 year old female for evaluation of atrial fibrillation at request of [name], MD. Patient recently seen by primary care and noted to be in atrial fibrillation.  Cardiology asked to evaluate.  Patient states that she has had some degree of dyspnea on exertion for the past 6 weeks.  No orthopnea, PND, pedal edema, chest pain or syncope.  Occasional brief fluttering sensation in her neck and chest.  She notes mild reduction in energy.  Current Outpatient Medications  Medication Sig Dispense Refill  . apixaban (ELIQUIS) 5 MG TABS tablet Take 5 mg by mouth 2 (two) times daily.    . cholecalciferol (VITAMIN D) 1000 units tablet Take 2,500 Units by mouth daily.     Marland Kitchen conjugated estrogens (PREMARIN) vaginal cream Place 1 Applicatorful vaginally 2 (two) times a week.    . loratadine (CLARITIN) 10 MG tablet Take 10 mg by mouth daily as needed for allergies.     . Melatonin 5 MG CAPS Take 5 mg by mouth at bedtime.    Marland Kitchen MELATONIN PO Take by mouth as needed.    . Omega-3 Fatty Acids (FISH OIL PO) Take by mouth.    Marland Kitchen OVER THE COUNTER MEDICATION Bone up     No current facility-administered medications for this visit.    Allergies  Allergen Reactions  . Nsaids Other (See Comments)    Caused painful stomach problems  . Prednisone Other (See Comments)    Caused painful stomach problems     Past Medical History:  Diagnosis Date  . Anxiety   . Arthritis    "left knee; left hip; lower back; mild to moderate in right hip" (09/30/2016)  . Atrial fibrillation ([place])   . Chronic lower back pain   . Gastritis   . Heart palpitations   . Hypercholesteremia   . Seasonal allergies     Past Surgical History:  Procedure Laterality Date  . COLONOSCOPY    . DILATION AND CURETTAGE OF UTERUS    . JOINT REPLACEMENT    . TONSILLECTOMY    . 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 Marital status: Married    Spouse name: Not on file  . Number of children: Not on file  . Years of education: Not on file  . Highest education level: Not on file  Occupational History  . Not on file  Tobacco Use  . Smoking status: Former Smoker    Packs/day: 2.00    Types: Cigarettes  . Smokeless tobacco: Never Used  . Tobacco comment: "quit smoking mid-late 1980s"  Substance and Sexual Activity  . Alcohol use: Yes    Alcohol/week: 5.0 standard drinks    Types: 5 Glasses of wine per week  . Drug use: No  . Sexual activity: Yes  Other Topics Concern  . Not on file  Social History Narrative  . Not on file   Social Determinants of Health   Financial Resource Strain:   . Difficulty of Paying Living Expenses:   Food Insecurity:   . Worried About Charity fundraiser in the Last Year:   . Arboriculturist in the Last Year:   Transportation Needs:   . Film/video editor (Medical):   Marland Kitchen Lack of Transportation (Non-Medical):  Physical Activity:   . Days of Exercise per Week:   . Minutes of Exercise per Session:   Stress:   . Feeling of Stress :   Social Connections:   . Frequency of Communication with Friends and Family:   . Frequency of Social Gatherings with Friends and Family:   . Attends Religious Services:   . Active Member of Clubs or Organizations:   . Attends Archivist Meetings:   Marland Kitchen Marital Status:   Intimate Partner Violence:   . Fear of Current or Ex-Partner:   . Emotionally Abused:   Marland Kitchen Physically Abused:   . Sexually Abused:     Family History  Problem Relation Age of Onset  . Hypertension Mother   . Cancer Father   . Diabetes Sister   . Breast cancer Neg Hx     ROS: no fevers or chills, productive cough, hemoptysis, dysphasia, odynophagia, melena, hematochezia, dysuria, hematuria, rash, seizure  activity, orthopnea, PND, pedal edema, claudication. Remaining systems are negative.  Physical Exam:   Blood pressure 110/76, pulse 75, height 5' 7.5" (1.715 m), weight 135 lb 12.8 oz (61.6 kg).  General:  Well developed/well nourished in NAD Skin warm/dry Patient not depressed No peripheral clubbing Back-normal HEENT-normal/normal eyelids Neck supple/normal carotid upstroke bilaterally; no bruits; no JVD; no thyromegaly chest - CTA/ normal expansion CV - irregular/normal S1 and S2; no murmurs, rubs or gallops;  PMI nondisplaced Abdomen -NT/ND, no HSM, no mass, + bowel sounds, no bruit 2+ femoral pulses, no bruits Ext-no edema, chords, 2+ DP Neuro-grossly nonfocal  ECG -October 13, 2019-atrial fibrillation, left axis deviation, RV conduction delay.  Personally reviewed  Electrocardiogram today personally reviewed and shows atrial fibrillation at a rate of 75, no ST changes.  A/P  1 newly diagnosed atrial fibrillation-check echocardiogram for LV function.  I will obtain most recent laboratories from primary care including TSH, hemoglobin and creatinine.  Add Toprol 25 mg daily.  CHADSvasc 3.  Continue apixaban 5 mg twice daily.  We discussed the rate control versus rhythm control today.  She appears to be symptomatic with increased fatigue and dyspnea on exertion.  We will therefore plan elective cardioversion in 3 weeks.  If she does not hold sinus rhythm then she may require antiarrhythmic versus referral for ablation.  2 hyperlipidemia-per primary care.  Kirk Ruths, MD

## 2019-10-26 ENCOUNTER — Other Ambulatory Visit: Payer: Self-pay

## 2019-10-26 ENCOUNTER — Encounter: Payer: Self-pay | Admitting: *Deleted

## 2019-10-26 ENCOUNTER — Ambulatory Visit (INDEPENDENT_AMBULATORY_CARE_PROVIDER_SITE_OTHER): Payer: PPO | Admitting: Cardiology

## 2019-10-26 ENCOUNTER — Encounter: Payer: Self-pay | Admitting: Cardiology

## 2019-10-26 VITALS — BP 110/76 | HR 75 | Ht 67.5 in | Wt 135.8 lb

## 2019-10-26 DIAGNOSIS — E7800 Pure hypercholesterolemia, unspecified: Secondary | ICD-10-CM | POA: Diagnosis not present

## 2019-10-26 DIAGNOSIS — I4819 Other persistent atrial fibrillation: Secondary | ICD-10-CM

## 2019-10-26 MED ORDER — METOPROLOL SUCCINATE ER 25 MG PO TB24: 25.0000 mg | ORAL_TABLET | Freq: Every day | ORAL | 3 refills | Status: DC

## 2019-10-26 NOTE — Patient Instructions (Addendum)
Medication Instructions:  START METOPROLOL SUCC ER 25 MG ONCE DAILY  *If you need a refill on your cardiac medications before your next appointment, please call your pharmacy*   Lab Work: If you have labs (blood work) drawn today and your tests are completely normal, you will receive your results only by: Marland Kitchen MyChart Message (if you have MyChart) OR . A paper copy in the mail If you have any lab test that is abnormal or we need to change your treatment, we will call you to review the results.   Testing/Procedures:  Your physician has requested that you have an echocardiogram. Echocardiography is a painless test that uses sound waves to create images of your heart. It provides your doctor with information about the size and shape of your heart and how well your heart's chambers and valves are working. This procedure takes approximately one hour. There are no restrictions for this procedure.  1ST FLOOR IMAGING DEPARTMENT IN [place] [facility]ICE  Your physician has recommended that you have a Cardioversion (DCCV). Electrical Cardioversion uses a jolt of electricity to your heart either through paddles or wired patches attached to your chest. This is a controlled, usually prescheduled, procedure. Defibrillation is done under light anesthesia in the hospital, and you usually go home the day of the procedure. This is done to get your heart back into a normal rhythm. You are not awake for the procedure. Please see the instruction sheet given to you today.  Follow-Up: At [facility], you and your health needs are our priority.  As part of our continuing mission to provide you with exceptional heart care, we have created designated Provider Care Teams.  These Care Teams include your primary Cardiologist (physician) and Advanced Practice Providers (APPs -  Physician Assistants and Nurse Practitioners) who all work together to provide you with the care you need, when you need it.  We recommend signing up  for the patient portal called "MyChart".  Sign up information is provided on this After Visit Summary.  MyChart is used to connect with patients for Virtual Visits (Telemedicine).  Patients are able to view lab/test results, encounter notes, upcoming appointments, etc.  Non-urgent messages can be sent to your provider as well.   To learn more about what you can do with MyChart, go to [email or web address].    Your next appointment:   6 week(s)  The format for your next appointment:   In Person  Provider:   Kirk Ruths, MD

## 2019-10-31 ENCOUNTER — Other Ambulatory Visit: Payer: Self-pay

## 2019-10-31 ENCOUNTER — Ambulatory Visit (HOSPITAL_COMMUNITY)
Admission: RE | Admit: 2019-10-31 | Discharge: 2019-10-31 | Disposition: A | Discharge status: Home / Self Care | Payer: PPO | Source: Ambulatory Visit | Attending: Cardiology | Admitting: Cardiology

## 2019-10-31 ENCOUNTER — Other Ambulatory Visit (HOSPITAL_COMMUNITY): Payer: PPO

## 2019-10-31 DIAGNOSIS — I351 Nonrheumatic aortic (valve) insufficiency: Secondary | ICD-10-CM | POA: Insufficient documentation

## 2019-10-31 DIAGNOSIS — E785 Hyperlipidemia, unspecified: Secondary | ICD-10-CM | POA: Insufficient documentation

## 2019-10-31 DIAGNOSIS — I4819 Other persistent atrial fibrillation: Secondary | ICD-10-CM

## 2019-10-31 NOTE — Progress Notes (Signed)
Echocardiogram 2D Echocardiogram has been performed.  Oneal Deputy Tennille Montelongo 10/31/2019, 10:44 AM

## 2019-11-14 ENCOUNTER — Other Ambulatory Visit (HOSPITAL_COMMUNITY)
Admission: RE | Admit: 2019-11-14 | Discharge: 2019-11-14 | Disposition: A | Discharge status: Home / Self Care | Payer: PPO | Source: Ambulatory Visit | Attending: Cardiology | Admitting: Cardiology

## 2019-11-14 DIAGNOSIS — Z20822 Contact with and (suspected) exposure to covid-19: Secondary | ICD-10-CM | POA: Diagnosis not present

## 2019-11-14 DIAGNOSIS — Z01812 Encounter for preprocedural laboratory examination: Secondary | ICD-10-CM | POA: Insufficient documentation

## 2019-11-14 LAB — SARS CORONAVIRUS 2 (TAT 6-24 HRS): SARS Coronavirus 2: NEGATIVE

## 2019-11-17 ENCOUNTER — Encounter (HOSPITAL_COMMUNITY)
Admission: RE | Disposition: A | Discharge status: Home / Self Care | Payer: Self-pay | Source: Home / Self Care | Attending: Cardiology

## 2019-11-17 ENCOUNTER — Ambulatory Visit (HOSPITAL_COMMUNITY): Payer: PPO | Admitting: Certified Registered Nurse Anesthetist

## 2019-11-17 ENCOUNTER — Ambulatory Visit (HOSPITAL_COMMUNITY)
Admission: RE | Admit: 2019-11-17 | Discharge: 2019-11-17 | Disposition: A | Discharge status: Home / Self Care | Payer: PPO | Attending: Cardiology | Admitting: Cardiology

## 2019-11-17 ENCOUNTER — Other Ambulatory Visit: Payer: Self-pay

## 2019-11-17 ENCOUNTER — Encounter (HOSPITAL_COMMUNITY): Payer: Self-pay | Admitting: Cardiology

## 2019-11-17 DIAGNOSIS — F419 Anxiety disorder, unspecified: Secondary | ICD-10-CM | POA: Diagnosis not present

## 2019-11-17 DIAGNOSIS — Z79899 Other long term (current) drug therapy: Secondary | ICD-10-CM | POA: Insufficient documentation

## 2019-11-17 DIAGNOSIS — R0609 Other forms of dyspnea: Secondary | ICD-10-CM | POA: Insufficient documentation

## 2019-11-17 DIAGNOSIS — Z7901 Long term (current) use of anticoagulants: Secondary | ICD-10-CM | POA: Insufficient documentation

## 2019-11-17 DIAGNOSIS — Z87891 Personal history of nicotine dependence: Secondary | ICD-10-CM | POA: Insufficient documentation

## 2019-11-17 DIAGNOSIS — E785 Hyperlipidemia, unspecified: Secondary | ICD-10-CM | POA: Insufficient documentation

## 2019-11-17 DIAGNOSIS — E7800 Pure hypercholesterolemia, unspecified: Secondary | ICD-10-CM | POA: Insufficient documentation

## 2019-11-17 DIAGNOSIS — I4819 Other persistent atrial fibrillation: Secondary | ICD-10-CM

## 2019-11-17 HISTORY — PX: CARDIOVERSION: SHX1299

## 2019-11-17 LAB — POCT I-STAT, CHEM 8
BUN: 25 mg/dL — ABNORMAL HIGH (ref 8–23)
Calcium, Ion: 1.17 mmol/L (ref 1.15–1.40)
Chloride: 106 mmol/L (ref 98–111)
Creatinine, Ser: 0.8 mg/dL (ref 0.44–1.00)
Glucose, Bld: 76 mg/dL (ref 70–99)
HCT: 43 % (ref 36.0–46.0)
Hemoglobin: 14.6 g/dL (ref 12.0–15.0)
Potassium: 4.6 mmol/L (ref 3.5–5.1)
Sodium: 141 mmol/L (ref 135–145)
TCO2: 27 mmol/L (ref 22–32)

## 2019-11-17 SURGERY — CARDIOVERSION
Anesthesia: General

## 2019-11-17 MED ORDER — PROPOFOL 10 MG/ML IV BOLUS
INTRAVENOUS | Status: DC | PRN
  Administered 2019-11-17: 50 mg via INTRAVENOUS

## 2019-11-17 MED ORDER — LIDOCAINE 2% (20 MG/ML) 5 ML SYRINGE
INTRAMUSCULAR | Status: DC | PRN
  Administered 2019-11-17: 40 mg via INTRAVENOUS

## 2019-11-17 MED ORDER — SODIUM CHLORIDE 0.9 % IV SOLN: INTRAVENOUS | Status: DC

## 2019-11-17 MED ORDER — EPHEDRINE SULFATE-NACL 50-0.9 MG/10ML-% IV SOSY
PREFILLED_SYRINGE | INTRAVENOUS | Status: DC | PRN
  Administered 2019-11-17: 5 mg via INTRAVENOUS

## 2019-11-17 NOTE — Procedures (Signed)
Electrical Cardioversion Procedure Note Shelia [name] TO:[id number] [birth year]  Procedure: Electrical Cardioversion Indications:  Atrial Fibrillation  Procedure Details Consent: Risks of procedure as well as the alternatives and risks of each were explained to the (patient/caregiver).  Consent for procedure obtained. Time Out: Verified patient identification, verified procedure, site/side was marked, verified correct patient position, special equipment/implants available, medications/allergies/relevent history reviewed, required imaging and test results available.  Performed  Patient placed on cardiac monitor, pulse oximetry, supplemental oxygen as necessary.  Sedation given: Pt sedated by anesthesia with lidocaine 40 mg and diprovan 50 mg IV. Pacer pads placed anterior and posterior chest.  Cardioverted 1 time(s).  Cardioverted at 120J.  Evaluation Findings: Post procedure EKG shows: sinus bradycardia Complications: None Patient did tolerate procedure well.   Kirk Ruths 11/17/2019, 9:20 AM

## 2019-11-17 NOTE — Anesthesia Preprocedure Evaluation (Signed)
Anesthesia Evaluation  Patient identified by MRN, date of birth, ID band Patient awake    Reviewed: Allergy & Precautions, NPO status , Patient's Chart, lab work & pertinent test results  Airway Mallampati: I  TM Distance: >3 FB Neck ROM: Full    Dental  (+) Teeth Intact, Dental Advisory Given   Pulmonary former smoker,    breath sounds clear to auscultation       Cardiovascular negative cardio ROS   Rhythm:Irregular Rate:Normal     Neuro/Psych Anxiety  Neuromuscular disease    GI/Hepatic negative GI ROS, Neg liver ROS,   Endo/Other  negative endocrine ROS  Renal/GU negative Renal ROS     Musculoskeletal  (+) Arthritis ,   Abdominal Normal abdominal exam  (+)   Peds  Hematology negative hematology ROS (+)   Anesthesia Other Findings   Reproductive/Obstetrics                             Echo:  1. Left ventricular ejection fraction, by estimation, is 60 to 65%. The  left ventricle has normal function. The left ventricle has no regional  wall motion abnormalities. Left ventricular diastolic parameters are  indeterminate.  2. Right ventricular systolic function is normal. The right ventricular  size is normal. There is normal pulmonary artery systolic pressure.  3. Left atrial size was moderately dilated.  4. The mitral valve is normal in structure. Mild mitral valve  regurgitation. No evidence of mitral stenosis.  5. The aortic valve is normal in structure. Aortic valve regurgitation is  trivial. No aortic stenosis is present.  6. The inferior vena cava is normal in size with greater than 50%  respiratory variability, suggesting right atrial pressure of 3 mmHg.   Anesthesia Physical Anesthesia Plan  ASA: III  Anesthesia Plan: General   Post-op Pain Management:    Induction: Intravenous  PONV Risk Score and Plan: 0  Airway Management Planned: Natural Airway and Simple  Face Mask  Additional Equipment: None  Intra-op Plan:   Post-operative Plan:   Informed Consent:   Plan Discussed with: CRNA  Anesthesia Plan Comments:         Anesthesia Quick Evaluation

## 2019-11-17 NOTE — Interval H&P Note (Signed)
History and Physical Interval Note:  11/17/2019 9:19 AM  Shelia [name]  has presented today for surgery, with the diagnosis of ATRIAL FIB.  The various methods of treatment have been discussed with the patient and family. After consideration of risks, benefits and other options for treatment, the patient has consented to  Procedure(s): CARDIOVERSION (N/A) as a surgical intervention.  The patient's history has been reviewed, patient examined, no change in status, stable for surgery.  I have reviewed the patient's chart and labs.  Questions were answered to the patient's satisfaction.     Kirk Ruths

## 2019-11-17 NOTE — Discharge Instructions (Signed)
Electrical Cardioversion Electrical cardioversion is the delivery of a jolt of electricity to restore a normal rhythm to the heart. A rhythm that is too fast or is not regular keeps the heart from pumping well. In this procedure, sticky patches or metal paddles are placed on the chest to deliver electricity to the heart from a device.  What can I expect after the procedure?  Your blood pressure, heart rate, breathing rate, and blood oxygen level will be monitored until you leave the hospital or clinic.  Your heart rhythm will be watched to make sure it does not change.  You may have some redness on the skin where the shocks were given.  Follow these instructions at home:  Do not drive for 24 hours if you were given a sedative during your procedure.  Take over-the-counter and prescription medicines only as told by your health care provider.  Ask your health care provider how to check your pulse. Check it often.  Rest for 48 hours after the procedure or as told by your health care provider.  Avoid or limit your caffeine use as told by your health care provider.  Keep all follow-up visits as told by your health care provider. This is important.  Contact a health care provider if:  You feel like your heart is beating too quickly or your pulse is not regular.  You have a serious muscle cramp that does not go away.  Get help right away if:  You have discomfort in your chest.  You are dizzy or you feel faint.  You have trouble breathing or you are short of breath.  Your speech is slurred.  You have trouble moving an arm or leg on one side of your body.  Your fingers or toes turn cold or blue.  Summary  Electrical cardioversion is the delivery of a jolt of electricity to restore a normal rhythm to the heart.  This procedure may be done right away in an emergency or may be a scheduled procedure if the condition is not an emergency.  Generally, this is a safe  procedure.  After the procedure, check your pulse often as told by your health care provider.  This information is not intended to replace advice given to you by your health care provider. Make sure you discuss any questions you have with your health care provider. Document Revised: 02/14/2019 Document Reviewed: 02/14/2019 Elsevier Patient Education  2020 Elsevier Inc.  

## 2019-11-17 NOTE — Anesthesia Procedure Notes (Signed)
Procedure Name: General with mask airway Date/Time: 11/17/2019 10:08 AM Performed by: Janene Harvey, CRNA Pre-anesthesia Checklist: Patient identified, Emergency Drugs available, Suction available and Patient being monitored Oxygen Delivery Method: Ambu bag Placement Confirmation: positive ETCO2 Dental Injury: Teeth and Oropharynx as per pre-operative assessment

## 2019-11-17 NOTE — Anesthesia Postprocedure Evaluation (Signed)
Anesthesia Post Note  Patient: Shelia [name]  Procedure(s) Performed: CARDIOVERSION (N/A )     Patient location during evaluation: PACU Anesthesia Type: General Level of consciousness: awake and alert Pain management: pain level controlled Vital Signs Assessment: post-procedure vital signs reviewed and stable Respiratory status: spontaneous breathing, nonlabored ventilation, respiratory function stable and patient connected to nasal cannula oxygen Cardiovascular status: blood pressure returned to baseline and stable Postop Assessment: no apparent nausea or vomiting Anesthetic complications: no    Last Vitals:  Vitals:   11/17/19 1021 11/17/19 1032  BP: (!) 95/50 (!) 94/55  Pulse: (!) 45 (!) 53  Resp: 20 16  Temp: 36.5 C   SpO2: 100% 100%    Last Pain:  Vitals:   11/17/19 1032  TempSrc:   PainSc: 0-No pain                 Effie Berkshire

## 2019-11-17 NOTE — H&P (Signed)
Office Visit     10/26/2019  [facility]            Shelia Perla, MD   Cardiology        Persistent atrial fibrillation [facility]) +1 more   Dx        Atrial Fibrillation . New Patient (Initial Visit); Referred by [name], MD   Reason for Visit        Additional Documentation   Vitals:       BP 110/76       Pulse 75       Ht 5' 7.5" (1.715 m)       Wt 61.6 kg       BMI 20.96 kg/m       BSA 1.71 m             More Vitals    Flowsheets:        Anthropometrics,       NEWS,       MEWS Score     Encounter Info:        Billing Info,       History,       Allergies,       Detailed Report            All Notes      Progress Notes by Shelia Perla, MD at 10/26/2019 12:20 PM   Author: [name], MD Author Type: Physician Filed: 10/26/2019  2:40 PM  Note Status: Signed Cosign: Cosign Not Required Encounter Date: 10/26/2019  Editor: Shelia Perla, MD (Physician)              untitled image        Shelia Rivers, MD  Reason for referral-atrial fibrillation     HPI: 79 year old female for evaluation of atrial fibrillation at request of [name], MD. Patient recently seen by primary care and noted to be in atrial fibrillation.  Cardiology asked to evaluate.  Patient states that she has had some degree of dyspnea on exertion for the past 6 weeks.  No orthopnea, PND, pedal edema, chest pain or syncope.  Occasional brief fluttering sensation in her neck and chest.  She notes mild reduction in energy.            Current Outpatient Medications    Medication   Sig   Dispense   Refill    .   apixaban (ELIQUIS) 5 MG TABS tablet   Take 5 mg by mouth 2 (two) times daily.            .   cholecalciferol (VITAMIN D) 1000 units tablet   Take 2,500 Units by mouth daily.              Marland Kitchen   conjugated estrogens (PREMARIN) vaginal cream   Place 1 Applicatorful vaginally 2 (two) times a week.            .   loratadine (CLARITIN) 10 MG tablet   Take 10 mg by mouth daily as needed for allergies.             .   Melatonin 5 MG CAPS   Take 5 mg by mouth at bedtime.            Marland Kitchen   MELATONIN PO   Take by mouth as needed.            .   Omega-3 Fatty Acids (FISH OIL PO)  Take by mouth.            Marland Kitchen   OVER THE COUNTER MEDICATION   Bone up                No current facility-administered medications for this visit.                Allergies    Allergen   Reactions    .   Nsaids   Other (See Comments)            Caused painful stomach problems    .   Prednisone   Other (See Comments)            Caused painful stomach problems                Past Medical History:    Diagnosis   Date    .   Anxiety        .   Arthritis            "left knee; left hip; lower back; mild to moderate in right hip" (09/30/2016)    .   Atrial fibrillation ([place])        .   Chronic lower back pain        .   Gastritis        .   Heart palpitations        .   Hypercholesteremia        .   Seasonal allergies                    Past Surgical History:    Procedure   Laterality   Date    .   COLONOSCOPY            .   DILATION AND CURETTAGE OF UTERUS            .   JOINT REPLACEMENT            .   TONSILLECTOMY            .   TOTAL HIP ARTHROPLASTY   Left   09/29/2016        Procedure: TOTAL HIP ARTHROPLASTY ANTERIOR APPROACH;  Surgeon: Dorna Leitz, MD;  Location: [place];  Service: Orthopedics;  Laterality: Left;           Social History             Socioeconomic History    .   Marital status:   Married            Spouse name:   Not on file    .    Number of children:   Not on file    .   Years of education:   Not on file    .   Highest education level:   Not on file    Occupational History    .   Not on file    Tobacco Use    .   Smoking status:   Former Smoker            Packs/day:   2.00            Types:   Cigarettes    .   Smokeless tobacco:   Never Used    .   Tobacco comment: "quit smoking mid-late 1980s"    Substance and Sexual Activity    .   Alcohol use:  Yes            Alcohol/week:   5.0 standard drinks            Types:   5 Glasses of wine per week    .   Drug use:   No    .   Sexual activity:   Yes    Other Topics   Concern    .   Not on file    Social History Narrative    .   Not on file        Social Determinants of Health           Financial Resource Strain:     .   Difficulty of Paying Living Expenses:     Food Insecurity:     .   Worried About Charity fundraiser in the Last Year:     .   Arboriculturist in the Last Year:     Transportation Needs:     .   Film/video editor (Medical):     Marland Kitchen   Lack of Transportation (Non-Medical):     Physical Activity:     .   Days of Exercise per Week:     .   Minutes of Exercise per Session:     Stress:     .   Feeling of Stress :     Social Connections:     .   Frequency of Communication with Friends and Family:     .   Frequency of Social Gatherings with Friends and Family:     .   Attends Religious Services:     .   Active Member of Clubs or Organizations:     .   Attends Archivist Meetings:     Marland Kitchen   Marital Status:     Intimate Partner Violence:     .   Fear of Current or Ex-Partner:     .   Emotionally Abused:     Marland Kitchen   Physically Abused:     .   Sexually Abused:                 Family History    Problem   Relation   Age of  Onset    .   Hypertension   Mother        .   Cancer   Father        .   Diabetes   Sister        .   Breast cancer   Neg Hx              ROS: no fevers or chills, productive cough, hemoptysis, dysphasia, odynophagia, melena, hematochezia, dysuria, hematuria, rash, seizure activity, orthopnea, PND, pedal edema, claudication. Remaining systems are negative.     Physical Exam:      Blood pressure 110/76, pulse 75, height 5' 7.5" (1.715 m), weight 135 lb 12.8 oz (61.6 kg).     General:  Well developed/well nourished in NAD  Skin warm/dry  Patient not depressed  No peripheral clubbing  Back-normal  HEENT-normal/normal eyelids  Neck supple/normal carotid upstroke bilaterally; no bruits; no JVD; no thyromegaly  chest - CTA/ normal expansion  CV - irregular/normal S1 and S2; no murmurs, rubs or gallops;  PMI nondisplaced  Abdomen -NT/ND, no HSM, no mass, + bowel sounds, no bruit  2+ femoral pulses, no bruits  Ext-no edema, chords, 2+  DP  Neuro-grossly nonfocal     ECG -October 13, 2019-atrial fibrillation, left axis deviation, RV conduction delay.  Personally reviewed     Electrocardiogram today personally reviewed and shows atrial fibrillation at a rate of 75, no ST changes.     A/P     1 newly diagnosed atrial fibrillation-check echocardiogram for LV function.  I will obtain most recent laboratories from primary care including TSH, hemoglobin and creatinine.  Add Toprol 25 mg daily.  CHADSvasc 3.  Continue apixaban 5 mg twice daily.  We discussed the rate control versus rhythm control today.  She appears to be symptomatic with increased fatigue and dyspnea on exertion.  We will therefore plan elective cardioversion in 3 weeks.  If she does not hold sinus rhythm then she may require antiarrhythmic versus referral for ablation.     2 hyperlipidemia-per primary care.     Kirk Ruths, MD   For DCCV; compliant with  apixaban; no changes Kirk Ruths

## 2019-11-17 NOTE — Transfer of Care (Signed)
Immediate Anesthesia Transfer of Care Note  Patient: Shelia [name]  Procedure(s) Performed: CARDIOVERSION (N/A )  Patient Location: Endoscopy Unit  Anesthesia Type:General  Level of Consciousness: drowsy  Airway & Oxygen Therapy: Patient Spontanous Breathing and Patient connected to nasal cannula oxygen  Post-op Assessment: Report given to RN and Post -op Vital signs reviewed and stable  Post vital signs: Reviewed  Last Vitals:  Vitals Value Taken Time  BP    Temp    Pulse    Resp    SpO2      Last Pain:  Vitals:   11/17/19 0922  TempSrc: Temporal  PainSc: 0-No pain         Complications: No apparent anesthesia complications

## 2019-11-20 ENCOUNTER — Telehealth: Payer: Self-pay | Admitting: Nurse Practitioner

## 2019-11-20 NOTE — Telephone Encounter (Signed)
   S/p DCCV 4/22.  Beginning on the evening of 4/23, she noted mild lower back discomfort, but notes that she lifted some heavy groceries.  She has also noted some abd bloating and read online that that may be a symptom of a heart attack. She denies chest pain or dyspnea.  She has checked her pulse and it has been regular w/ rates in the 60's.  I advised that her symptoms are somewhat vague and based on her description, do not sound cardiac in origin, however, if she cont to feel poorly, she should contact the office in the AM to arrange for ECG.  Caller verbalized understanding and was grateful for the call back.  Murray Hodgkins, NP 11/20/2019, 5:00 PM

## 2019-11-22 ENCOUNTER — Telehealth: Payer: Self-pay | Admitting: Cardiology

## 2019-11-22 NOTE — Telephone Encounter (Signed)
FU as scheduled Milfred Krammes  

## 2019-11-22 NOTE — Telephone Encounter (Signed)
Patient is calling about irregular heart rate and chest pressure & discomfort, she had yesterday.  She states she had a cardioversion done on 4/22.  She wants to know what she should do when she experiences something like that. If Hilda Blades can call her after 1:15pm today.

## 2019-11-22 NOTE — Telephone Encounter (Signed)
Left message for patient with Dr Creshaw's recommendations.   

## 2019-11-22 NOTE — Telephone Encounter (Signed)
Called and spoke with pt she states that she had a cardioversion on last Wednesday and yesterday she was experiencing some irregular pulses and chest discomfort. She stated that over this weekend she felt distended and bloated and experience a bad headache along with back pain and chest tightness. She stated that with the chest tightness yesterday it was over the left side and she also felt heart flutters and her pulse was irregular.   She reports that she feels fine today and denies any of these symptoms at this time. She wanted to know what she was supposed to do when these symptoms occurred.   Notified pt that it was correct to call us when she experiences symptoms such as this. Notified that if she begins to have active chest pain or SOB that we do recommend she seek immediate evaluation at the nearest ED. Pt verbalized understanding with these instructions. Notified I would send a message to Dr.Crenshaw to review and we would let her know if he had any other recommendations at this time.  Pt thankful with no other questions at this time.  Pt has an appt with Dr.Crenshaw on 12/14/19 at 11:40am.

## 2019-11-25 DIAGNOSIS — M2042 Other hammer toe(s) (acquired), left foot: Secondary | ICD-10-CM | POA: Diagnosis not present

## 2019-11-25 DIAGNOSIS — M21612 Bunion of left foot: Secondary | ICD-10-CM | POA: Diagnosis not present

## 2019-11-25 DIAGNOSIS — R2689 Other abnormalities of gait and mobility: Secondary | ICD-10-CM | POA: Diagnosis not present

## 2019-11-25 DIAGNOSIS — M21611 Bunion of right foot: Secondary | ICD-10-CM | POA: Diagnosis not present

## 2019-11-29 DIAGNOSIS — R001 Bradycardia, unspecified: Secondary | ICD-10-CM | POA: Diagnosis not present

## 2019-11-29 DIAGNOSIS — I4891 Unspecified atrial fibrillation: Secondary | ICD-10-CM | POA: Diagnosis not present

## 2019-12-08 NOTE — Progress Notes (Signed)
HPI: Follow-up atrial fibrillation.  Patient recently seen for newly diagnosed atrial fibrillation.  Echocardiogram April 2021 showed normal LV function, moderate left atrial enlargement, mild mitral regurgitation and trace aortic insufficiency.  Patient had successful cardioversion November 17, 2019.  Since last seen she feels much better.  Her energy has improved.  She denies dyspnea, chest pain, palpitations, syncope or bleeding.  Current Outpatient Medications  Medication Sig Dispense Refill  . apixaban (ELIQUIS) 5 MG TABS tablet Take 5 mg by mouth 2 (two) times daily.    . Cholecalciferol (VITAMIN D) 125 MCG (5000 UT) CAPS Take 2,500 Units by mouth daily.     Marland Kitchen conjugated estrogens (PREMARIN) vaginal cream Place 1 Applicatorful vaginally 2 (two) times a week.    . loratadine (CLARITIN) 10 MG tablet Take 10 mg by mouth daily as needed for allergies.     . Melatonin 5 MG CAPS Take 5 mg by mouth at bedtime.    . Omega-3 Fatty Acids (FISH OIL) 1000 MG CAPS Take 1,000 mg by mouth in the morning, at noon, and at bedtime.      No current facility-administered medications for this visit.     Past Medical History:  Diagnosis Date  . Anxiety   . Arthritis    "left knee; left hip; lower back; mild to moderate in right hip" (09/30/2016)  . Atrial fibrillation ([place])   . Chronic lower back pain   . Gastritis   . Heart palpitations   . Hypercholesteremia   . Seasonal allergies     Past Surgical History:  Procedure Laterality Date  . CARDIOVERSION N/A 11/17/2019   Procedure: CARDIOVERSION;  Surgeon: [name], MD;  Location: [facility] ENDOSCOPY;  Service: Cardiovascular;  Laterality: N/A;  . COLONOSCOPY    . DILATION AND CURETTAGE OF UTERUS    . JOINT REPLACEMENT    . TONSILLECTOMY    . 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   Procedure: TOTAL HIP ARTHROPLASTY ANTERIOR APPROACH;  Surgeon: Dorna Leitz, MD;  Location: [place];  Service: Orthopedics;  Laterality: Left;    Social History     Socioeconomic History  . Marital status: Married    Spouse name: Not on file  . Number of children: Not on file  . Years of education: Not on file  . Highest education level: Not on file  Occupational History  . Not on file  Tobacco Use  . Smoking status: Former Smoker    Packs/day: 2.00    Types: Cigarettes  . Smokeless tobacco: Never Used  . Tobacco comment: "quit smoking mid-late 1980s"  Substance and Sexual Activity  . Alcohol use: Yes    Alcohol/week: 5.0 standard drinks    Types: 5 Glasses of wine per week  . Drug use: No  . Sexual activity: Yes  Other Topics Concern  . Not on file  Social History Narrative  . Not on file   Social Determinants of Health   Financial Resource Strain:   . Difficulty of Paying Living Expenses:   Food Insecurity:   . Worried About Charity fundraiser in the Last Year:   . Arboriculturist in the Last Year:   Transportation Needs:   . Film/video editor (Medical):   Marland Kitchen Lack of Transportation (Non-Medical):   Physical Activity:   . Days of Exercise per Week:   . Minutes of Exercise per Session:   Stress:   . Feeling of Stress :   Social Connections:   .  Frequency of Communication with Friends and Family:   . Frequency of Social Gatherings with Friends and Family:   . Attends Religious Services:   . Active Member of Clubs or Organizations:   . Attends Archivist Meetings:   Marland Kitchen Marital Status:   Intimate Partner Violence:   . Fear of Current or Ex-Partner:   . Emotionally Abused:   Marland Kitchen Physically Abused:   . Sexually Abused:     Family History  Problem Relation Age of Onset  . Hypertension Mother   . Cancer Father   . Diabetes Sister   . Breast cancer Neg Hx     ROS: no fevers or chills, productive cough, hemoptysis, dysphasia, odynophagia, melena, hematochezia, dysuria, hematuria, rash, seizure activity, orthopnea, PND, pedal edema, claudication. Remaining systems are negative.  Physical Exam: Well-developed  well-nourished in no acute distress.  Skin is warm and dry.  HEENT is normal.  Neck is supple.  Chest is clear to auscultation with normal expansion.  Cardiovascular exam is regular rate and rhythm.  Abdominal exam nontender or distended. No masses palpated. Extremities show no edema. neuro grossly intact  ECG-sinus bradycardia at a rate of 47, left anterior fascicular block, RV conduction delay.  Personally reviewed  A/P  1 paroxysmal atrial fibrillation-patient remains in sinus rhythm status post cardioversion.  Metoprolol discontinued because of bradycardia.  Continue apixaban.  Note LV function is normal.  Obtain most recent hemoglobin, renal function and TSH from primary care..  If she does not hold sinus rhythm we will need to consider an antiarrhythmic in the future versus referral for ablation as she was symptomatic.  2 hyperlipidemia-Per primary care.  Kirk Ruths, MD

## 2019-12-09 DIAGNOSIS — Z0000 Encounter for general adult medical examination without abnormal findings: Secondary | ICD-10-CM | POA: Diagnosis not present

## 2019-12-09 DIAGNOSIS — L719 Rosacea, unspecified: Secondary | ICD-10-CM | POA: Diagnosis not present

## 2019-12-09 DIAGNOSIS — F39 Unspecified mood [affective] disorder: Secondary | ICD-10-CM | POA: Diagnosis not present

## 2019-12-09 DIAGNOSIS — E7800 Pure hypercholesterolemia, unspecified: Secondary | ICD-10-CM | POA: Diagnosis not present

## 2019-12-09 DIAGNOSIS — I4891 Unspecified atrial fibrillation: Secondary | ICD-10-CM | POA: Diagnosis not present

## 2019-12-09 DIAGNOSIS — J309 Allergic rhinitis, unspecified: Secondary | ICD-10-CM | POA: Diagnosis not present

## 2019-12-09 DIAGNOSIS — K2970 Gastritis, unspecified, without bleeding: Secondary | ICD-10-CM | POA: Diagnosis not present

## 2019-12-09 DIAGNOSIS — R2689 Other abnormalities of gait and mobility: Secondary | ICD-10-CM | POA: Diagnosis not present

## 2019-12-09 DIAGNOSIS — M859 Disorder of bone density and structure, unspecified: Secondary | ICD-10-CM | POA: Diagnosis not present

## 2019-12-09 DIAGNOSIS — R32 Unspecified urinary incontinence: Secondary | ICD-10-CM | POA: Diagnosis not present

## 2019-12-09 DIAGNOSIS — M1990 Unspecified osteoarthritis, unspecified site: Secondary | ICD-10-CM | POA: Diagnosis not present

## 2019-12-09 DIAGNOSIS — Z1389 Encounter for screening for other disorder: Secondary | ICD-10-CM | POA: Diagnosis not present

## 2019-12-14 ENCOUNTER — Other Ambulatory Visit: Payer: Self-pay

## 2019-12-14 ENCOUNTER — Ambulatory Visit: Payer: PPO | Admitting: Cardiology

## 2019-12-14 ENCOUNTER — Encounter: Payer: Self-pay | Admitting: Cardiology

## 2019-12-14 VITALS — BP 104/62 | HR 47 | Ht 67.0 in | Wt 135.8 lb

## 2019-12-14 DIAGNOSIS — E7800 Pure hypercholesterolemia, unspecified: Secondary | ICD-10-CM | POA: Diagnosis not present

## 2019-12-14 DIAGNOSIS — I4819 Other persistent atrial fibrillation: Secondary | ICD-10-CM

## 2019-12-14 MED ORDER — APIXABAN 5 MG PO TABS: 5.0000 mg | ORAL_TABLET | Freq: Two times a day (BID) | ORAL | 3 refills | Status: DC

## 2019-12-14 NOTE — Patient Instructions (Signed)
Medication Instructions:  NO CHANGE *If you need a refill on your cardiac medications before your next appointment, please call your pharmacy*   Lab Work: If you have labs (blood work) drawn today and your tests are completely normal, you will receive your results only by: . MyChart Message (if you have MyChart) OR . A paper copy in the mail If you have any lab test that is abnormal or we need to change your treatment, we will call you to review the results.   Follow-Up: At [facility], you and your health needs are our priority.  As part of our continuing mission to provide you with exceptional heart care, we have created designated Provider Care Teams.  These Care Teams include your primary Cardiologist (physician) and Advanced Practice Providers (APPs -  Physician Assistants and Nurse Practitioners) who all work together to provide you with the care you need, when you need it.  We recommend signing up for the patient portal called "MyChart".  Sign up information is provided on this After Visit Summary.  MyChart is used to connect with patients for Virtual Visits (Telemedicine).  Patients are able to view lab/test results, encounter notes, upcoming appointments, etc.  Non-urgent messages can be sent to your provider as well.   To learn more about what you can do with MyChart, go to [email or web address].    Your next appointment:   6 month(s)  The format for your next appointment:   Either In Person or Virtual  Provider:   Brian Crenshaw, MD    

## 2020-01-03 ENCOUNTER — Encounter: Payer: Self-pay | Admitting: Physical Therapy

## 2020-01-03 ENCOUNTER — Ambulatory Visit: Payer: PPO | Attending: Family Medicine | Admitting: Physical Therapy

## 2020-01-03 ENCOUNTER — Other Ambulatory Visit: Payer: Self-pay

## 2020-01-03 DIAGNOSIS — R2689 Other abnormalities of gait and mobility: Secondary | ICD-10-CM | POA: Diagnosis not present

## 2020-01-03 DIAGNOSIS — R278 Other lack of coordination: Secondary | ICD-10-CM | POA: Insufficient documentation

## 2020-01-03 DIAGNOSIS — R2681 Unsteadiness on feet: Secondary | ICD-10-CM | POA: Diagnosis not present

## 2020-01-03 DIAGNOSIS — M6281 Muscle weakness (generalized): Secondary | ICD-10-CM | POA: Diagnosis not present

## 2020-01-03 NOTE — Patient Instructions (Signed)
Prepared By: [place], [place]  Phone: [phone]  Step 1  Step 2  Single Leg Stance sets: 1  reps: 5  hold: 30  daily: 1  weekly: 7 Setup  Begin in a standing upright position with your feet together and arms resting at your sides. Movement  Lift one foot off the floor, balancing on your other leg. Maintain your balance in this position. Tip  Try not to move your arms away from your body or let your weight shift from side to side. Step 1  Step 2  Step 3  Step 4  Single Leg Balance with Clock Reach sets: 1  reps: 10  hold: 30  daily: 1  weekly: 7 Setup  Begin in a standing upright position. Imagine you are standing in the middle of a clock. Movement  Bend your legs slightly, then reach forward with one foot toward 12 o'clock, then bring it back to the starting position and reach toward 3 o'clock. Continue, reaching toward 6 o'clock, then 9 o'clock, and repeat. Tip  Make sure to keep your hips level as you reach with your leg, and do not let your standing knee collapse inward. Step 1  Step 2  Standing Tandem Balance with Counter Support sets: 1  reps: 5  hold: 30  daily: 1  weekly: 7 5 each leg Try slowly turning your head slowly in this position with counter top nearby   Setup  Begin in a standing upright position with your hands resting on a counter. Movement  Place one foot directly behind the other, so that you are in a heel-to-toe position. Maintain your balance in this position. Tip  Make sure to maintain an upright posture and use the counter to help you balance as needed.

## 2020-01-04 NOTE — Therapy (Signed)
[place], [place], [address] Phone: [phone]   Fax:  [phone]  Physical Therapy Evaluation  Patient Details  Name: Shelia [name] MRN: [id number] Date of Birth: 13-Apr-1941 Referring Provider (PT): Dr. Izora Gala    Encounter Date: 01/03/2020  PT End of Session - 01/03/20 1945    Visit Number  1    Number of Visits  6    Date for PT Re-Evaluation  02/14/20    PT Start Time  1220    PT Stop Time  1303    PT Time Calculation (min)  43 min    Activity Tolerance  Patient tolerated treatment well    Behavior During Therapy  [facility] for tasks assessed/performed       Past Medical History:  Diagnosis Date  . Anxiety   . Arthritis    "left knee; left hip; lower back; mild to moderate in right hip" (09/30/2016)  . Atrial fibrillation ([place])   . Chronic lower back pain   . Gastritis   . Heart palpitations   . Hypercholesteremia   . Seasonal allergies     Past Surgical History:  Procedure Laterality Date  . CARDIOVERSION N/A 11/17/2019   Procedure: CARDIOVERSION;  Surgeon: [name], MD;  Location: [facility] ENDOSCOPY;  Service: Cardiovascular;  Laterality: N/A;  . COLONOSCOPY    . DILATION AND CURETTAGE OF UTERUS    . JOINT REPLACEMENT    . TONSILLECTOMY    . 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   Subjective Assessment - 01/03/20 1224    Subjective  Pt has had a few falls within the past several months.    Sitting a long time will bring about an unsteadiness when she stands and transitions to walking. She continues to exercise 20-30 min per day. Would like to be more confident with balance and get some exercises she could work on at home.    Pertinent History  A fibrillation. L THR hip, back pain    Limitations  Standing;Walking;House hold activities    How long can you walk comfortably?  can do 1.5 miles without much fatigue, up to 1 hour, not limited by pain, but legs fatigued    Patient Stated Goals  Better balance    Currently in Pain?  No/denies         [facility] PT Assessment - 01/03/20 0001      Assessment   Medical Diagnosis  gait, balance     Referring Provider (PT)  Dr. Izora Gala     Onset Date/Surgical Date  --   6 mos    Prior Therapy  Yes       Precautions   Precautions  Other (comment)    Precaution Comments  A-fib      Restrictions   Weight Bearing Restrictions  No      Balance Screen   Has the patient fallen in the past 6 months  Yes    How many times?  2    Has the patient had a decrease in activity level because of a fear of falling?   Yes    Is the patient reluctant to leave their home because of a fear of falling?   No      Home Environment   Living  Environment  Private residence    Living Arrangements  Spouse/significant other    Type of [place] to enter    Entrance Stairs-Number of Steps  10    Entrance Stairs-Rails  Right    [place]  Two level;Laundry or work area in Education administrator of Steps  12    Alternate Level Stairs-Rails  Right    Additional Comments  curve in stairs , nervous about carrying laundry , narrow doorways       Prior Function   Level of Independence  Independent    Vocation  Retired    Physiological scientist, active     Leisure  exercises, family      Cognition   Overall Cognitive Status  Within Functional Limits for tasks assessed      Sensation   Light Touch  Appears Intact      Posture/Postural Control   Posture/Postural Control  No significant limitations      ROM / Strength   AROM / PROM / Strength  --   hips 4/5 knees 5/5      Transfers   Five time sit to stand comments   9.3 sec       Ambulation/Gait   Ambulation Distance (Feet)   300 Feet    Ambulation Surface  Level;Indoor    Gait Comments  no deviations in straight path, steady gait and normal pace       Standardized Balance Assessment   Standardized Balance Assessment  Timed Up and Go Test      Dynamic Gait Index   Level Surface  Normal    Change in Gait Speed  Normal    Gait with Horizontal Head Turns  Mild Impairment    Gait with Vertical Head Turns  Mild Impairment    Gait and Pivot Turn  Mild Impairment    Step Over Obstacle  Normal    Step Around Obstacles  Normal    Steps  Normal    Total Score  21    DGI comment:  21/24         Objective measurements completed on examination: See above findings.      PT Education - 01/03/20 1944    Education Details  Pt/POC, Balance testing and simple balance exercises    Person(s) Educated  Patient    Methods  Explanation;Demonstration;Handout    Comprehension  Returned demonstration;Verbalized understanding          PT Long Term Goals - 01/03/20 1945      PT LONG TERM GOAL #1   Title  Patient will demonstrate proper technique with home program for core and hip strengthening     Time  6    Period  Weeks    Status  New    Target Date  02/14/20      PT LONG TERM GOAL #2   Title  Pt will be able to negotiate stairs without rails to her basement and mod to light object with greater confidence in LE strength, balance    Time  6    Period  Weeks    Status  New    Target Date  02/14/20      PT LONG TERM GOAL #3   Title  Pt will be able to increase DGI to 24/24 to reduce risk of falls    Time  6    Period  Weeks  Status  New    Target Date  02/14/20      PT LONG TERM GOAL #4   Title  Pt will be able to stand for 30 sec in single leg stance without UE support and min dynamic activity    Time  6    Period  Weeks    Status  New    Target Date  02/14/20             Plan - 01/03/20 1414    Clinical Impression Statement  Patient presents for low complexity eval with complaint of  unsteadiness in gait intermittently.  She lacks confidence with stair negotiation in her home.  She loses her balance at times (but catches herself) when turning or transitioning to stand.  Testing showed mild difficulty with gait and head turns.  Lacks SLS stability bilaterally. MIld weakness in hips and core.  She will do well with PT intervention to address functional deficits and prevent further falls.    Personal Factors and Comorbidities  Age;Comorbidity 2    Comorbidities  Atrial fibrillation , L THR    Examination-Activity Limitations  Stairs;Transfers;Locomotion Level    Examination-Participation Restrictions  Church;Community Activity;Interpersonal Relationship;Laundry    Stability/Clinical Decision Making  Stable/Uncomplicated    Clinical Decision Making  Low    Rehab Potential  Excellent    PT Frequency  1x / week    PT Duration  6 weeks    PT Treatment/Interventions  ADLs/Self Care Home Management;Therapeutic activities;Therapeutic exercise;Neuromuscular re-education;Patient/family education;Gait training    PT Next Visit Plan  Berg, TUG, Core routine    PT Home Exercise Plan  given SLS , tandem and head turns    Consulted and Agree with Plan of Care  Patient       Patient will benefit from skilled therapeutic intervention in order to improve the following deficits and impairments:  Decreased balance, Decreased coordination  Visit Diagnosis: Other abnormalities of gait and mobility  Unsteadiness on feet     Problem List Patient Active Problem List   Diagnosis Date Noted  . Primary osteoarthritis of left hip 09/29/2016  . Urinary incontinence 06/25/2016  . Lumbar radiculopathy 06/18/2016  . 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 Abnormality of gait 02/02/2013  . Nonallopathic lesion of lumbar region 04/14/2011  . OSTEOARTHRITIS, HIP, LEFT 11/14/2009  . METATARSALGIA 11/14/2009  . BUNION, RIGHT FOOT 11/14/2009  . HIP PAIN, LEFT 10/02/2009  . Pain in joint, lower leg  10/02/2009  . LOW BACK PAIN, CHRONIC 10/02/2009    Minie Roadcap 01/04/2020, 7:27 AM  [facility] [address] [place], [place], [address] Phone: [phone]   Fax:  [phone]  Name: KATRIN GRABEL MRN: [id number] Date of Birth: June 03, 1941   [name], PT 01/04/20 7:28 AM Phone: [phone] Fax: [phone]

## 2020-01-10 ENCOUNTER — Other Ambulatory Visit: Payer: Self-pay

## 2020-01-10 ENCOUNTER — Ambulatory Visit: Payer: PPO | Admitting: Physical Therapy

## 2020-01-10 ENCOUNTER — Encounter: Payer: Self-pay | Admitting: Physical Therapy

## 2020-01-10 DIAGNOSIS — R278 Other lack of coordination: Secondary | ICD-10-CM

## 2020-01-10 DIAGNOSIS — R2689 Other abnormalities of gait and mobility: Secondary | ICD-10-CM | POA: Diagnosis not present

## 2020-01-10 DIAGNOSIS — M6281 Muscle weakness (generalized): Secondary | ICD-10-CM

## 2020-01-10 DIAGNOSIS — R2681 Unsteadiness on feet: Secondary | ICD-10-CM

## 2020-01-10 NOTE — Therapy (Signed)
[place], [place], [address] Phone: [phone]   Fax:  [phone]  Physical Therapy Treatment  Patient Details  Name: Shelia [name] MRN: [id number] Date of Birth: [birth year] Referring Provider (PT): Dr. Izora Gala    Encounter Date: 01/10/2020   PT End of Session - 01/10/20 1719    Visit Number 2    Number of Visits 6    Date for PT Re-Evaluation 02/14/20    PT Start Time 0814    PT Stop Time 4818    PT Time Calculation (min) 46 min           Past Medical History:  Diagnosis Date  . Anxiety   . Arthritis    "left knee; left hip; lower back; mild to moderate in right hip" (09/30/2016)  . Atrial fibrillation ([place])   . Chronic lower back pain   . Gastritis   . Heart palpitations   . Hypercholesteremia   . Seasonal allergies     Past Surgical History:  Procedure Laterality Date  . CARDIOVERSION N/A 11/17/2019   Procedure: CARDIOVERSION;  Surgeon: [name], MD;  Location: [facility] ENDOSCOPY;  Service: Cardiovascular;  Laterality: N/A;  . COLONOSCOPY    . DILATION AND CURETTAGE OF UTERUS    . JOINT REPLACEMENT    . TONSILLECTOMY    . 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   Subjective Assessment - 01/10/20 1711    Subjective No pain.  Has been practicing her balance exercises. After I get going with walking I feel better.  The 1st 5 min I feel like "i'm drunk"              [facility] PT Assessment - 01/10/20 0001      Berg Balance Test   Sit to Stand Able to stand without using hands and stabilize independently    Standing Unsupported Able to stand safely 2 minutes    Sitting with Back Unsupported but Feet Supported on Floor or Stool Able to sit safely and securely 2 minutes    Stand to Sit  Sits safely with minimal use of hands    Transfers Able to transfer safely, minor use of hands    Standing Unsupported with Eyes Closed Able to stand 10 seconds safely    Standing Unsupported with Feet Together Able to place feet together independently and stand 1 minute safely    From Standing, Reach Forward with Outstretched Arm Can reach confidently >25 cm (10")    From Standing Position, Pick up Object from Floor Able to pick up shoe safely and easily    From Standing Position, Turn to Look Behind Over each Shoulder Looks behind from both sides and weight shifts well    Turn 360 Degrees Able to turn 360 degrees safely in 4 seconds or less    Standing Unsupported, Alternately Place Feet on Step/Stool Able to stand independently and safely and complete 8 steps in 20 seconds    Standing Unsupported, One Foot in Front Able to place foot tandem independently and hold 30 seconds    Standing on One Leg Able to lift leg independently and hold > 10 seconds    Total Score 56  [facility] Adult PT Treatment/Exercise - 01/10/20 0001      High Level Balance   High Level Balance Activities Braiding;Backward walking;Head turns;Tandem walking    High Level Balance Comments tandem with EO, EC,      Self-Care   Self-Care Posture;Other Self-Care Comments    Posture core     Other Self-Care Comments  HEP, goals, how to challenge safely, causes of balance issues       Knee/Hip Exercises: Standing   Heel Raises 1 set;10 reps    Heel Raises Limitations eyes closed     SLS with red band: shoulder extension, and tandem as well x 5 each leg , heel rasise hold with shoulder ext     SLS with Vectors 3 point toe touch on foam x 10                        PT Long Term Goals - 01/10/20 1747      PT LONG TERM GOAL #1   Title Patient will demonstrate proper technique with home program for core and hip strengthening     Status On-going      PT LONG TERM GOAL #2    Title Pt will be able to negotiate stairs without rails to her basement and mod to light object with greater confidence in LE strength, balance    Baseline no rails after the bend in the staircase    Status On-going      PT LONG TERM GOAL #3   Title Pt will be able to increase DGI to 24/24 to reduce risk of falls    Status On-going      PT LONG TERM GOAL #4   Title Pt will be able to stand for 30 sec in single leg stance without UE support and min dynamic activity    Status On-going                 Plan - 01/10/20 1751    Clinical Impression Statement Pt requires increased time due to sequencing difficulty.  Did so well on many tasks, above average for age.  Given tips for increasing balance challenge within her current HEP. Included eyes closed, compliant surface and head turns.  56/56 on Berg.    PT Treatment/Interventions ADLs/Self Care Home Management;Therapeutic activities;Therapeutic exercise;Neuromuscular re-education;Patient/family education;Gait training    PT Next Visit Plan elliptical, balance  Core routine    PT Home Exercise Plan given SLS , tandem and head turns    Consulted and Agree with Plan of Care Patient           Patient will benefit from skilled therapeutic intervention in order to improve the following deficits and impairments:     Visit Diagnosis: Other abnormalities of gait and mobility  Unsteadiness on feet  Other lack of coordination  Muscle weakness (generalized)     Problem List Patient Active Problem List   Diagnosis Date Noted  . Primary osteoarthritis of left hip 09/29/2016  . Urinary incontinence 06/25/2016  . Lumbar radiculopathy 06/18/2016  . 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 Abnormality of gait 02/02/2013  . Nonallopathic lesion of lumbar region 04/14/2011  . OSTEOARTHRITIS, HIP, LEFT 11/14/2009  . METATARSALGIA 11/14/2009  . BUNION, RIGHT FOOT 11/14/2009  . HIP PAIN, LEFT 10/02/2009  . Pain in joint, lower leg  10/02/2009  . LOW BACK PAIN, CHRONIC 10/02/2009    Rayansh Herbst 01/10/2020, 6:05 PM  [facility] [place]  [place], [place], [address] Phone: [phone]   Fax:  [phone]  Name: Shelia [name] MRN: [id number] Date of Birth: May 17, 1941  [name], PT 01/10/20 6:05 PM Phone: [phone] Fax: [phone]

## 2020-01-17 ENCOUNTER — Other Ambulatory Visit: Payer: Self-pay

## 2020-01-17 ENCOUNTER — Ambulatory Visit: Payer: PPO | Admitting: Physical Therapy

## 2020-01-17 DIAGNOSIS — R2689 Other abnormalities of gait and mobility: Secondary | ICD-10-CM | POA: Diagnosis not present

## 2020-01-17 DIAGNOSIS — R278 Other lack of coordination: Secondary | ICD-10-CM

## 2020-01-17 DIAGNOSIS — M6281 Muscle weakness (generalized): Secondary | ICD-10-CM

## 2020-01-17 DIAGNOSIS — R2681 Unsteadiness on feet: Secondary | ICD-10-CM

## 2020-01-17 NOTE — Therapy (Signed)
[place], [place], [address] Phone: [phone]   Fax:  [phone]  Physical Therapy Treatment  Patient Details  Name: Shelia Shelia [name] MRN: [id number] Date of Birth: Aug 29, 1940 Referring Provider (PT): Dr. Izora Gala    Encounter Date: 01/17/2020   PT End of Session - 01/17/20 1657    Visit Number 3    Number of Visits 6    Date for PT Re-Evaluation 02/14/20    PT Start Time 1616    PT Stop Time 1700    PT Time Calculation (min) 44 min    Activity Tolerance Patient tolerated treatment well    Behavior During Therapy [facility] for tasks assessed/performed           Past Medical History:  Diagnosis Date  . Anxiety   . Arthritis    "left knee; left hip; lower back; mild to moderate in right hip" (09/30/2016)  . Atrial fibrillation ([place])   . Chronic lower back pain   . Gastritis   . Heart palpitations   . Hypercholesteremia   . Seasonal allergies     Past Surgical History:  Procedure Laterality Date  . CARDIOVERSION N/A 11/17/2019   Procedure: CARDIOVERSION;  Surgeon: [name], MD;  Location: [facility] ENDOSCOPY;  Service: Cardiovascular;  Laterality: N/A;  . COLONOSCOPY    . DILATION AND CURETTAGE OF UTERUS    . JOINT REPLACEMENT    . TONSILLECTOMY    . 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;  Service: Orthopedics;  Laterality: Left;    There were no vitals filed for this visit.   Subjective Assessment - 01/17/20 2019    Subjective I feel like i can stand longer on each leg the exercises are helping.    Currently in Pain? No/denies             [facility] Adult PT Treatment/Exercise - 01/17/20 0001      Neuro Re-ed    Neuro Re-ed Details  standing balance on foam : EC, head movements , upper trunk rotation march and opp arm lift /opp leg       Lumbar Exercises: Supine   Advanced  Lumbar Stabilization Limitations lower abs from table top and in hooklying.  Given for HEP       Knee/Hip Exercises: Stretches   Active Hamstring Stretch Both;2 reps      Knee/Hip Exercises: Aerobic   Elliptical 5 min L 6 ramp, L 1 resist.       Knee/Hip Exercises: Supine   Bridges Strengthening;Both;1 set;15 reps    Bridges Limitations with ball       Shoulder Exercises: Standing   Shoulder Flexion Weight (lbs) push 6 lbs wgt forward in SLS x 10 each leg     Theraband Level (Shoulder Diagonals) Level 2 (Red)    Diagonals Weight (lbs) x 10 reach with head turn     Other Standing Exercises row in hip hinge 6 lbs x 10 each                   PT Education - 01/17/20 2020    Education Details core and lower abs    Person(s) Educated Patient    Methods Explanation;Demonstration;Handout    Comprehension Verbalized understanding;Returned demonstration               PT Long Term Goals - 01/10/20 1747  PT LONG TERM GOAL #1   Title Patient Shelia [name] demonstrate proper technique with home program for core and hip strengthening     Status On-going      PT LONG TERM GOAL #2   Title Pt Shelia [name] be able to negotiate stairs without rails to her basement and mod to light object with greater confidence in LE strength, balance    Baseline no rails after the bend in the staircase    Status On-going      PT LONG TERM GOAL #3   Title Pt Shelia [name] be able to increase DGI to 24/24 to reduce risk of falls    Status On-going      PT LONG TERM GOAL #4   Title Pt Shelia [name] be able to stand for 30 sec in single leg stance without UE support and min dynamic activity    Status On-going                 Plan - 01/17/20 2021    Clinical Impression Statement Continued to challenge patient in standing balance and recovery.  Reviewed her core routine (verbally) but she loses lower abdominals specific exercises.  Shelia [name] likely DC after next visit.    PT Treatment/Interventions ADLs/Self Care Home  Management;Therapeutic activities;Therapeutic exercise;Neuromuscular re-education;Patient/family education;Gait training    PT Next Visit Plan elliptical, balance  Core routine and DC? Fall recovery , try with 1 arm    PT Home Exercise Plan given SLS , tandem and head turns, lower ab 90/90 with toe taps    Consulted and Agree with Plan of Care Patient           Patient Shelia [name] benefit from skilled therapeutic intervention in order to improve the following deficits and impairments:  Decreased balance, Decreased coordination  Visit Diagnosis: Other abnormalities of gait and mobility  Unsteadiness on feet  Other lack of coordination  Muscle weakness (generalized)     Problem List Patient Active Problem List   Diagnosis Date Noted  . Primary osteoarthritis of left hip 09/29/2016  . Urinary incontinence 06/25/2016  . Lumbar radiculopathy 06/18/2016  . 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 Abnormality of gait 02/02/2013  . Nonallopathic lesion of lumbar region 04/14/2011  . OSTEOARTHRITIS, HIP, LEFT 11/14/2009  . METATARSALGIA 11/14/2009  . BUNION, RIGHT FOOT 11/14/2009  . HIP PAIN, LEFT 10/02/2009  . Pain in joint, lower leg 10/02/2009  . LOW BACK PAIN, CHRONIC 10/02/2009    Naarah Borgerding 01/17/2020, 8:25 PM  [facility] [address] [place], [place], [address] Phone: [phone]   Fax:  [phone]  Name: Shelia Shelia [name] MRN: [id number] Date of Birth: 1941/02/08  [name], PT 01/17/20 8:26 PM Phone: [phone] Fax: [phone]

## 2020-01-24 ENCOUNTER — Encounter: Payer: Self-pay | Admitting: Physical Therapy

## 2020-01-24 ENCOUNTER — Other Ambulatory Visit: Payer: Self-pay

## 2020-01-24 ENCOUNTER — Ambulatory Visit: Payer: PPO | Admitting: Physical Therapy

## 2020-01-24 DIAGNOSIS — R2689 Other abnormalities of gait and mobility: Secondary | ICD-10-CM | POA: Diagnosis not present

## 2020-01-24 DIAGNOSIS — M6281 Muscle weakness (generalized): Secondary | ICD-10-CM

## 2020-01-24 DIAGNOSIS — R278 Other lack of coordination: Secondary | ICD-10-CM

## 2020-01-24 DIAGNOSIS — R2681 Unsteadiness on feet: Secondary | ICD-10-CM

## 2020-01-24 NOTE — Therapy (Signed)
[place], [place], [address] Phone: [phone]   Fax:  [phone]  Physical Therapy Treatment/Discharge   Patient Details  Name: Shelia [name] MRN: [id number] Date of Birth: 25-Aug-1940 Referring Provider (PT): Dr. Izora Gala    Encounter Date: 01/24/2020   PT End of Session - 01/24/20 1705    Visit Number 4    Date for PT Re-Evaluation 02/14/20    PT Start Time 1704    PT Stop Time 1750    PT Time Calculation (min) 46 min    Activity Tolerance Patient tolerated treatment well    Behavior During Therapy [facility] for tasks assessed/performed           Past Medical History:  Diagnosis Date  . Anxiety   . Arthritis    "left knee; left hip; lower back; mild to moderate in right hip" (09/30/2016)  . Atrial fibrillation ([place])   . Chronic lower back pain   . Gastritis   . Heart palpitations   . Hypercholesteremia   . Seasonal allergies     Past Surgical History:  Procedure Laterality Date  . CARDIOVERSION N/A 11/17/2019   Procedure: CARDIOVERSION;  Surgeon: [name], MD;  Location: [facility] ENDOSCOPY;  Service: Cardiovascular;  Laterality: N/A;  . COLONOSCOPY    . DILATION AND CURETTAGE OF UTERUS    . JOINT REPLACEMENT    . TONSILLECTOMY    . 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   Procedure: TOTAL HIP ARTHROPLASTY ANTERIOR APPROACH;  Surgeon: Dorna Leitz, MD;  Location: [place];  Service: Orthopedics;  Laterality: Left;    There were no vitals filed for this visit.   Subjective Assessment - 01/24/20 1704    Subjective The exercises are helping.  Last visit today, its just what I needed.              [facility] PT Assessment - 01/24/20 0001      Dynamic Gait Index   Level Surface Normal    Change in Gait Speed Normal    Gait with Horizontal Head Turns Normal    Gait with Vertical Head Turns Mild Impairment    Gait and Pivot Turn Mild Impairment    Step Over Obstacle Normal     Step Around Obstacles Normal    Steps Normal    Total Score 22    DGI comment: 22/24      Timed Up and Go Test   Normal TUG (seconds) 6.5    Cognitive TUG (seconds) 7.5           [facility] Adult PT Treatment/Exercise - 01/24/20 0001      Neuro Re-ed    Neuro Re-ed Details  see note           Standing balance in parallel bars: SLS on foam pod, bilateral LEs on foam   Added arm movements overhead and horizontal abduction  Rocker board lateral weightshifts, static and min dynamic Anterior/post.  Static and min dynamic ,used bars intermittently as needed   PT Education - 01/24/20 1755    Education Details fall recovery, balance challenges, problem solving for laundry and stairs    Person(s) Educated Patient    Methods Explanation;Demonstration;Verbal cues    Comprehension Verbalized understanding;Returned demonstration               PT Long Term Goals - 01/24/20 1744      PT LONG TERM GOAL #1   Title Patient will demonstrate proper technique with  home program for core and hip strengthening     Status Achieved      PT LONG TERM GOAL #2   Title Pt will be able to negotiate stairs without rails to her basement and mod to light object with greater confidence in LE strength, balance    Baseline has changed behavior and is problem solving    Status Achieved      PT LONG TERM GOAL #3   Title Pt will be able to increase DGI to 24/24 to reduce risk of falls    Baseline 22/24    Status Partially Met      PT LONG TERM GOAL #4   Title Pt will be able to stand for 30 sec in single leg stance without UE support and min dynamic activity    Status Achieved                 Plan - 01/24/20 1748    Clinical Impression Statement Pt has a good plan to cont to improve balance and balance reactions with gait, mobility. She continues to have diffiuclty (very min) with head movements while balancing.  She was reassured by her abilities in the clinic and is quite fit and strong for  age.    PT Treatment/Interventions ADLs/Self Care Home Management;Therapeutic activities;Therapeutic exercise;Neuromuscular re-education;Patient/family education;Gait training    PT Next Visit Plan NA, DC    PT Home Exercise Plan given SLS , tandem and head turns, lower ab 90/90 with toe taps    Consulted and Agree with Plan of Care Patient           Patient will benefit from skilled therapeutic intervention in order to improve the following deficits and impairments:  Decreased balance, Decreased coordination  Visit Diagnosis: Other abnormalities of gait and mobility  Other lack of coordination  Unsteadiness on feet  Muscle weakness (generalized)     Problem List Patient Active Problem List   Diagnosis Date Noted  . Primary osteoarthritis of left hip 09/29/2016  . Urinary incontinence 06/25/2016  . Lumbar radiculopathy 06/18/2016  . 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 Abnormality of gait 02/02/2013  . Nonallopathic lesion of lumbar region 04/14/2011  . OSTEOARTHRITIS, HIP, LEFT 11/14/2009  . METATARSALGIA 11/14/2009  . BUNION, RIGHT FOOT 11/14/2009  . HIP PAIN, LEFT 10/02/2009  . Pain in joint, lower leg 10/02/2009  . LOW BACK PAIN, CHRONIC 10/02/2009    , 01/24/2020, 6:02 PM  [facility] [address] [place], [place], [address] Phone: [phone]   Fax:  [phone]  Name: Shelia [name] MRN: [id number] Date of Birth: 1940-12-13  [name], PT 01/24/20 6:06 PM Phone: [phone] Fax: [phone]  PHYSICAL THERAPY DISCHARGE SUMMARY  Visits from Start of Care: 4   Current functional level related to goals / functional outcomes: See above    Remaining deficits: Min dynamic balance when head turns involved , can correct    Education / Equipment: Balance, role of core , fall recovery  Plan: Patient agrees to discharge.  Patient goals were met. Patient is being discharged due to  meeting the stated rehab goals.  ?????    [name], PT 01/24/20 6:11 PM Phone: [phone] Fax: [phone]

## 2020-03-09 ENCOUNTER — Other Ambulatory Visit: Payer: Self-pay | Admitting: Radiology

## 2020-03-09 ENCOUNTER — Other Ambulatory Visit: Payer: PPO

## 2020-03-09 DIAGNOSIS — Z20822 Contact with and (suspected) exposure to covid-19: Secondary | ICD-10-CM | POA: Diagnosis not present

## 2020-03-10 LAB — SARS-COV-2, NAA 2 DAY TAT

## 2020-03-10 LAB — NOVEL CORONAVIRUS, NAA: SARS-CoV-2, NAA: DETECTED — AB

## 2020-03-11 ENCOUNTER — Telehealth (HOSPITAL_COMMUNITY): Payer: Self-pay | Admitting: Nurse Practitioner

## 2020-03-11 ENCOUNTER — Telehealth: Payer: Self-pay | Admitting: Unknown Physician Specialty

## 2020-03-11 NOTE — Telephone Encounter (Signed)
Called to discuss with Shelia [name] about Covid symptoms and the use of a monoclonal antibody infusion for those with mild to moderate Covid symptoms and at a high risk of hospitalization.       Pt is not a good candidate for infusion therapy as her symptoms started 6 days ago, they are mild, and improving.  She has been vaccinated.   Symptoms tier reviewed as well as criteria for ending isolation. Preventative practices reviewed. Patient verbalized understanding     Patient Active Problem List   Diagnosis Date Noted  . Primary osteoarthritis of left hip 09/29/2016  . Urinary incontinence 06/25/2016  . Lumbar radiculopathy 06/18/2016  . 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 Abnormality of gait 02/02/2013  . Nonallopathic lesion of lumbar region 04/14/2011  . OSTEOARTHRITIS, HIP, LEFT 11/14/2009  . METATARSALGIA 11/14/2009  . BUNION, RIGHT FOOT 11/14/2009  . HIP PAIN, LEFT 10/02/2009  . Pain in joint, lower leg 10/02/2009  . LOW BACK PAIN, CHRONIC 10/02/2009

## 2020-03-11 NOTE — Telephone Encounter (Signed)
Called to Discuss with patient about Covid symptoms and the use of regeneron, a monoclonal antibody infusion for those with mild to moderate Covid symptoms and at a high risk of hospitalization.     Pt is qualified for this infusion at the [facility] infusion center due to co-morbid conditions and/or a member of an at-risk group.     Unable to reach pt. Left message to return call. Sent mychart message.   Eulas Schweitzer, DNP, AGNP-C [phone] (Infusion Center Hotline)  

## 2020-03-26 DIAGNOSIS — Z1231 Encounter for screening mammogram for malignant neoplasm of breast: Secondary | ICD-10-CM | POA: Diagnosis not present

## 2020-05-01 ENCOUNTER — Ambulatory Visit: Payer: PPO | Attending: Internal Medicine

## 2020-05-01 DIAGNOSIS — Z23 Encounter for immunization: Secondary | ICD-10-CM

## 2020-05-01 NOTE — Progress Notes (Signed)
   Covid-19 Vaccination Clinic  Name:  Shelia [name]    MRN: [id number] DOB: 1940-08-12  05/01/2020  Shelia [name] was observed post Covid-19 immunization for 15 minutes without incident. She was provided with Vaccine Information Sheet and instruction to access the V-Safe system.   Shelia [name] was instructed to call 911 with any severe reactions post vaccine: Marland Kitchen Difficulty breathing  . Swelling of face and throat  . A fast heartbeat  . A bad rash all over body  . Dizziness and weakness

## 2020-06-02 DIAGNOSIS — Z23 Encounter for immunization: Secondary | ICD-10-CM | POA: Diagnosis not present

## 2020-07-24 NOTE — Progress Notes (Signed)
HPI: Follow-up atrial fibrillation.  Patient recently seen for newly diagnosed atrial fibrillation.  Echocardiogram April 2021 showed normal LV function, moderate left atrial enlargement, mild mitral regurgitation and trace aortic insufficiency.  Patient had successful cardioversion November 17, 2019.  Since last seen  she denies dyspnea, chest pain, syncope or bleeding.  Occasional brief flutter in her throat but not sustained.  Current Outpatient Medications  Medication Sig Dispense Refill  . apixaban (ELIQUIS) 5 MG TABS tablet Take 1 tablet (5 mg total) by mouth 2 (two) times daily. 180 tablet 3  . Cholecalciferol (VITAMIN D) 125 MCG (5000 UT) CAPS Take 2,500 Units by mouth daily.     Marland Kitchen conjugated estrogens (PREMARIN) vaginal cream Place 1 Applicatorful vaginally 2 (two) times a week.    . loratadine (CLARITIN) 10 MG tablet Take 10 mg by mouth daily as needed for allergies.    . Melatonin 5 MG CAPS Take 5 mg by mouth at bedtime.    . NON FORMULARY Take 2 tablets by mouth daily. BONE-UP    . Omega-3 Fatty Acids (FISH OIL) 1000 MG CAPS Take 1,000 mg by mouth in the morning, at noon, and at bedtime.      No current facility-administered medications for this visit.     Past Medical History:  Diagnosis Date  . Anxiety   . Arthritis    "left knee; left hip; lower back; mild to moderate in right hip" (09/30/2016)  . Atrial fibrillation ([facility])   . Chronic lower back pain   . Gastritis   . Heart palpitations   . Hypercholesteremia   . Seasonal allergies     Past Surgical History:  Procedure Laterality Date  . CARDIOVERSION N/A 11/17/2019   Procedure: CARDIOVERSION;  Surgeon: Lewayne Bunting, MD;  Location: [facility] ENDOSCOPY;  Service: Cardiovascular;  Laterality: N/A;  . COLONOSCOPY    . DILATION AND CURETTAGE OF UTERUS    . JOINT REPLACEMENT    . TONSILLECTOMY    . 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   Procedure: TOTAL HIP ARTHROPLASTY ANTERIOR APPROACH;  Surgeon: Jodi Geralds, MD;   Location: [facility] OR;  Service: Orthopedics;  Laterality: Left;    Social History   Socioeconomic History  . Marital status: Married    Spouse name: Not on file  . Number of children: Not on file  . Years of education: Not on file  . Highest education level: Not on file  Occupational History  . Not on file  Tobacco Use  . Smoking status: Former Smoker    Packs/day: 2.00    Types: Cigarettes  . Smokeless tobacco: Never Used  . Tobacco comment: "quit smoking mid-late 1980s"  Substance and Sexual Activity  . Alcohol use: Yes    Alcohol/week: 5.0 standard drinks    Types: 5 Glasses of wine per week  . Drug use: No  . Sexual activity: Yes  Other Topics Concern  . Not on file  Social History Narrative  . Not on file   Social Determinants of Health   Financial Resource Strain: Not on file  Food Insecurity: Not on file  Transportation Needs: Not on file  Physical Activity: Not on file  Stress: Not on file  Social Connections: Not on file  Intimate Partner Violence: Not on file    Family History  Problem Relation Age of Onset  . Hypertension Mother   . Cancer Father   . Diabetes Sister   . Breast cancer Neg Hx  ROS: no fevers or chills, productive cough, hemoptysis, dysphasia, odynophagia, melena, hematochezia, dysuria, hematuria, rash, seizure activity, orthopnea, PND, pedal edema, claudication. Remaining systems are negative.  Physical Exam: Well-developed well-nourished in no acute distress.  Skin is warm and dry.  HEENT is normal.  Neck is supple.  Chest is clear to auscultation with normal expansion.  Cardiovascular exam is regular rate and rhythm.  Abdominal exam nontender or distended. No masses palpated. Extremities show no edema. neuro grossly intact  ECG-sinus bradycardia with PACs, cannot rule out septal infarct, left anterior fascicular block. Personally reviewed  A/P  1 paroxysmal atrial fibrillation-patient remains in sinus rhythm.  Note her  beta-blocker was discontinued previously due to bradycardia.  Continue apixaban.  If she has more frequent episodes in the future we will consider addition of an antiarrhythmic versus referral for ablation.  Check hemoglobin renal function.  2 hyperlipidemia-managed by primary care.  Kirk Ruths, MD

## 2020-07-26 DIAGNOSIS — S76319A Strain of muscle, fascia and tendon of the posterior muscle group at thigh level, unspecified thigh, initial encounter: Secondary | ICD-10-CM | POA: Diagnosis not present

## 2020-08-01 ENCOUNTER — Other Ambulatory Visit: Payer: Self-pay

## 2020-08-01 ENCOUNTER — Encounter: Payer: Self-pay | Admitting: Cardiology

## 2020-08-01 ENCOUNTER — Ambulatory Visit: Payer: PPO | Admitting: Cardiology

## 2020-08-01 VITALS — BP 106/58 | HR 50 | Ht 67.0 in | Wt 136.0 lb

## 2020-08-01 DIAGNOSIS — E7800 Pure hypercholesterolemia, unspecified: Secondary | ICD-10-CM | POA: Diagnosis not present

## 2020-08-01 DIAGNOSIS — I480 Paroxysmal atrial fibrillation: Secondary | ICD-10-CM | POA: Diagnosis not present

## 2020-08-01 NOTE — Patient Instructions (Signed)

## 2020-08-02 LAB — CBC
Hematocrit: 41.4 % (ref 34.0–46.6)
Hemoglobin: 14.5 g/dL (ref 11.1–15.9)
MCH: 33.6 pg — ABNORMAL HIGH (ref 26.6–33.0)
MCHC: 35 g/dL (ref 31.5–35.7)
MCV: 96 fL (ref 79–97)
Platelets: 179 10*3/uL (ref 150–450)
RBC: 4.31 x10E6/uL (ref 3.77–5.28)
RDW: 11.2 % — ABNORMAL LOW (ref 11.7–15.4)
WBC: 3.6 10*3/uL (ref 3.4–10.8)

## 2020-08-02 LAB — BASIC METABOLIC PANEL
BUN/Creatinine Ratio: 18 (ref 12–28)
BUN: 15 mg/dL (ref 8–27)
CO2: 25 mmol/L (ref 20–29)
Calcium: 9.6 mg/dL (ref 8.7–10.3)
Chloride: 104 mmol/L (ref 96–106)
Creatinine, Ser: 0.83 mg/dL (ref 0.57–1.00)
GFR calc Af Amer: 78 mL/min/{1.73_m2} (ref >59)
GFR calc non Af Amer: 68 mL/min/{1.73_m2} (ref >59)
Glucose: 75 mg/dL (ref 65–99)
Potassium: 4.7 mmol/L (ref 3.5–5.2)
Sodium: 143 mmol/L (ref 134–144)

## 2020-08-17 DIAGNOSIS — B349 Viral infection, unspecified: Secondary | ICD-10-CM | POA: Diagnosis not present

## 2020-09-05 DIAGNOSIS — R2689 Other abnormalities of gait and mobility: Secondary | ICD-10-CM | POA: Diagnosis not present

## 2020-09-05 DIAGNOSIS — M21611 Bunion of right foot: Secondary | ICD-10-CM | POA: Diagnosis not present

## 2020-09-05 DIAGNOSIS — M21612 Bunion of left foot: Secondary | ICD-10-CM | POA: Diagnosis not present

## 2020-09-05 DIAGNOSIS — M2042 Other hammer toe(s) (acquired), left foot: Secondary | ICD-10-CM | POA: Diagnosis not present

## 2020-09-13 DIAGNOSIS — N958 Other specified menopausal and perimenopausal disorders: Secondary | ICD-10-CM | POA: Diagnosis not present

## 2020-09-13 DIAGNOSIS — Z6821 Body mass index (BMI) 21.0-21.9, adult: Secondary | ICD-10-CM | POA: Diagnosis not present

## 2020-09-13 DIAGNOSIS — Z01419 Encounter for gynecological examination (general) (routine) without abnormal findings: Secondary | ICD-10-CM | POA: Diagnosis not present

## 2020-10-01 DIAGNOSIS — H0288A Meibomian gland dysfunction right eye, upper and lower eyelids: Secondary | ICD-10-CM | POA: Diagnosis not present

## 2020-10-01 DIAGNOSIS — H16223 Keratoconjunctivitis sicca, not specified as Sjogren's, bilateral: Secondary | ICD-10-CM | POA: Diagnosis not present

## 2020-10-01 DIAGNOSIS — H26491 Other secondary cataract, right eye: Secondary | ICD-10-CM | POA: Diagnosis not present

## 2020-10-01 DIAGNOSIS — H0288B Meibomian gland dysfunction left eye, upper and lower eyelids: Secondary | ICD-10-CM | POA: Diagnosis not present

## 2020-10-26 ENCOUNTER — Other Ambulatory Visit: Payer: Self-pay | Admitting: Cardiology

## 2020-10-26 NOTE — Telephone Encounter (Signed)
Prescription refill request for Eliquis received. Indication: atrial fib Last office visit:1/22 [place] Scr:0.83 1/22 Age: 80 Weight:61.7 kg  Prescription refilled

## 2020-11-15 ENCOUNTER — Encounter: Payer: Self-pay | Admitting: Family

## 2020-11-15 ENCOUNTER — Ambulatory Visit (INDEPENDENT_AMBULATORY_CARE_PROVIDER_SITE_OTHER): Payer: PPO | Admitting: Family

## 2020-11-15 ENCOUNTER — Other Ambulatory Visit: Payer: Self-pay

## 2020-11-15 VITALS — BP 110/80 | HR 52 | Temp 97.3°F | Resp 16 | Ht 67.0 in | Wt 135.6 lb

## 2020-11-15 DIAGNOSIS — M85852 Other specified disorders of bone density and structure, left thigh: Secondary | ICD-10-CM

## 2020-11-15 DIAGNOSIS — M1612 Unilateral primary osteoarthritis, left hip: Secondary | ICD-10-CM | POA: Diagnosis not present

## 2020-11-15 DIAGNOSIS — Z0000 Encounter for general adult medical examination without abnormal findings: Secondary | ICD-10-CM

## 2020-11-15 DIAGNOSIS — Z1231 Encounter for screening mammogram for malignant neoplasm of breast: Secondary | ICD-10-CM

## 2020-11-15 NOTE — Progress Notes (Signed)
Provider: Marlowe Sax FNP-C   Merrit Waugh, Nelda Bucks, NP  Patient Care Team: Kenya Kook, Nelda Bucks, NP as PCP - General (Family Medicine) Molli Posey, MD as Consulting Physician (Obstetrics and Gynecology) Stanford Breed Denice Bors, MD as Consulting Physician (Cardiology) Stephannie Li, [place] (Ophthalmology)  Extended Emergency Contact Information Primary Emergency Contact: Gentle,Tom Address: [phone]          [place] [address] [place] Phone: [phone] Relation: Spouse Secondary Emergency Contact: Stephannie Li Address: [address]          [place], [place] [address] [place] Mobile Phone: [phone] Relation: Daughter  Code Status: Full Code  Goals of care: Advanced Directive information Advanced Directives 11/15/2020  Does Patient Have a Medical Advance Directive? Yes  Type of Advance Directive [place]  Does patient want to make changes to medical advance directive? No - Patient declined  Copy of [place] in Chart? Yes - validated most recent copy scanned in chart (See row information)     Chief Complaint  Patient presents with  . Establish Care    New Patient.    HPI:  Pt is a 80 y.o. female seen today for medical management of chronic diseases. Has a medical history of Arterial fibrillation on Eliquis,hyperlipidemia,Osteopaenia, seasonal Allergies,Insomnia,Flatuelence among other conditions.  Memory issues - several year seems to be worsening.Has seen a Neuropsychology in the past thinks will schedule an appointment.  Left hip pain - tylenol effective.  Osteopenia - latest bone density bilateral hips T-score -1.3 currently on Bone up supplement.  Has been following up with [facility] for pap smear but has not been seen over couple of years. On conjugated estrogen vaginal cream    Due for mammogram.No symptoms.    Past Medical History:  Diagnosis Date   . Anxiety   . Arthritis    "left knee; left hip; lower back; mild to moderate in right hip" (09/30/2016)  . Atrial fibrillation ([place])   . Chronic lower back pain   . Gastritis   . Heart palpitations   . History of bone density study 2018  . History of mammogram 2021  . History of MRI 2018  . History of Papanicolaou smear of cervix   . Hypercholesteremia   . Osteopenia   . Seasonal allergies    Past Surgical History:  Procedure Laterality Date  . CARDIOVERSION N/A 11/17/2019   Procedure: CARDIOVERSION;  Surgeon: [name], MD;  Location: [facility] ENDOSCOPY;  Service: Cardiovascular;  Laterality: N/A;  . COLONOSCOPY    . DILATION AND CURETTAGE OF UTERUS    . JOINT REPLACEMENT    . TONSILLECTOMY    . 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   Procedure: TOTAL HIP ARTHROPLASTY ANTERIOR APPROACH;  Surgeon: Dorna Leitz, MD;  Location: [facility];  Service: Orthopedics;  Laterality: Left;    Allergies  Allergen Reactions  . Nsaids Other (See Comments)    Caused painful stomach problems  . Prednisone Other (See Comments)    Caused painful stomach problems    Allergies as of 11/15/2020      Reactions   Nsaids Other (See Comments)   Caused painful stomach problems   Prednisone Other (See Comments)   Caused painful stomach problems      Medication List       Accurate as of November 15, 2020  2:14 PM. If you have any questions, ask your nurse or doctor.  Eliquis 5 MG Tabs tablet Generic drug: apixaban TAKE 1 TABLET(5 MG) BY MOUTH TWICE DAILY   GAS-X PO Take 1 capsule by mouth as needed.   loratadine 10 MG tablet Commonly known as: CLARITIN Take 10 mg by mouth daily as needed for allergies.   MELATONIN PO Take 3 mg by mouth daily.   NON FORMULARY Take 2 tablets by mouth daily. BONE-UP   OMEGA-3 FISH OIL-VITAMIN D3 PO Take 3 capsules by mouth daily.   Premarin vaginal cream Generic drug: conjugated estrogens Place 1 Applicatorful vaginally once a week.   TYLENOL  PO Take 1 tablet by mouth.       Review of Systems  Constitutional: Negative for appetite change, chills, fatigue, fever and unexpected weight change.  HENT: Positive for postnasal drip and rhinorrhea. Negative for congestion, dental problem, ear discharge, ear pain, facial swelling, hearing loss, nosebleeds, sinus pressure, sinus pain, sneezing, sore throat, tinnitus and trouble swallowing.        Allergies   Eyes: Positive for visual disturbance. Negative for pain, discharge, redness and itching.       Wears eye glasses  Dry eyes   Respiratory: Negative for cough, chest tightness, shortness of breath and wheezing.   Cardiovascular: Negative for chest pain, palpitations and leg swelling.       Arterial fibrillation   Gastrointestinal: Negative for abdominal distention, abdominal pain, blood in stool, constipation, diarrhea, nausea and vomiting.  Endocrine: Positive for cold intolerance. Negative for heat intolerance, polydipsia, polyphagia and polyuria.  Genitourinary: Negative for difficulty urinating, dysuria, flank pain, frequency and urgency.       Light incontinence   Musculoskeletal: Positive for arthralgias and back pain. Negative for gait problem, joint swelling, myalgias, neck pain and neck stiffness.       Occasional knee pain stretching helps  Osteopenia   Skin: Negative for color change, pallor, rash and wound.  Neurological: Negative for dizziness, syncope, speech difficulty, weakness, light-headedness, numbness and headaches.  Hematological: Does not bruise/bleed easily.  Psychiatric/Behavioral: Negative for agitation, behavioral problems, confusion, hallucinations, self-injury, sleep disturbance and suicidal ideas. The patient is nervous/anxious.        Has strong faith.just worries too much about the future and children.Has seen Neurologist. Chronic memory issues     Immunization History  Administered Date(s) Administered  . Influenza Split 04/30/2015, 04/20/2016,  04/27/2018, 06/16/2019  . Influenza, High Dose Seasonal PF 06/28/2017, 06/16/2019, 06/02/2020  . PFIZER(Purple Top)SARS-COV-2 Vaccination 08/12/2019, 09/02/2019, 05/01/2020  . Pneumococcal Conjugate-13 09/30/2013  . Pneumococcal Polysaccharide-23 07/27/2003, 09/24/2010  . Td 04/26/2001, 12/25/2017  . Tdap 09/24/2010  . Zoster 12/11/2006, 04/27/2018, 06/29/2018  . Zoster Recombinat (Shingrix) 11/16/2017, 01/06/2018   Pertinent  Health Maintenance Due  Topic Date Due  . INFLUENZA VACCINE  02/25/2021  . DEXA SCAN  Completed  . PNA vac Low Risk Adult  Completed   Fall Risk  11/15/2020  Falls in the past year? 0  Number falls in past yr: 0  Injury with Fall? 0   Functional Status Survey:    Vitals:   11/15/20 1323  BP: 110/80  Pulse: (!) 52  Resp: 16  Temp: (!) 97.3 F (36.3 C)  SpO2: 98%  Weight: 135 lb 9.6 oz (61.5 kg)  Height: 5\' 7"  (1.702 m)   Body mass index is 21.24 kg/m. Physical Exam Vitals reviewed.  Constitutional:      General: She is not in acute distress.    Appearance: Normal appearance. She is normal weight. She is not ill-appearing or  diaphoretic.  HENT:     Head: Normocephalic.     Right Ear: Tympanic membrane, ear canal and external ear normal. There is no impacted cerumen.     Left Ear: Tympanic membrane, ear canal and external ear normal. There is no impacted cerumen.     Nose: Nose normal. No congestion or rhinorrhea.     Mouth/Throat:     Mouth: Mucous membranes are moist.     Pharynx: Oropharynx is clear. No oropharyngeal exudate or posterior oropharyngeal erythema.  Eyes:     General: No scleral icterus.       Right eye: No discharge.        Left eye: No discharge.     Extraocular Movements: Extraocular movements intact.     Conjunctiva/sclera: Conjunctivae normal.     Pupils: Pupils are equal, round, and reactive to light.  Neck:     Vascular: No carotid bruit.  Cardiovascular:     Rate and Rhythm: Normal rate and regular rhythm.      Pulses: Normal pulses.     Heart sounds: Normal heart sounds. No murmur heard. No friction rub. No gallop.   Pulmonary:     Effort: Pulmonary effort is normal. No respiratory distress.     Breath sounds: Normal breath sounds. No wheezing, rhonchi or rales.  Chest:     Chest wall: No tenderness.  Abdominal:     General: Bowel sounds are normal. There is no distension.     Palpations: Abdomen is soft. There is no mass.     Tenderness: There is no abdominal tenderness. There is no right CVA tenderness, left CVA tenderness, guarding or rebound.  Musculoskeletal:        General: No swelling or tenderness. Normal range of motion.     Cervical back: Normal range of motion. No rigidity or tenderness.     Right lower leg: No edema.     Left lower leg: No edema.  Lymphadenopathy:     Cervical: No cervical adenopathy.  Skin:    General: Skin is warm and dry.     Coloration: Skin is not pale.     Findings: No bruising, erythema, lesion or rash.  Neurological:     Mental Status: She is alert and oriented to person, place, and time.     Cranial Nerves: No cranial nerve deficit.     Sensory: No sensory deficit.     Motor: No weakness.     Coordination: Coordination normal.     Gait: Gait normal.  Psychiatric:        Mood and Affect: Mood normal.        Speech: Speech normal.        Behavior: Behavior normal.        Thought Content: Thought content normal.        Cognition and Memory: Memory is impaired.        Judgment: Judgment normal.     Labs reviewed: Recent Labs    11/17/19 0943 08/01/20 1117  NA 141 143  K 4.6 4.7  CL 106 104  CO2  --  25  GLUCOSE 76 75  BUN 25* 15  CREATININE 0.80 0.83  CALCIUM  --  9.6   No results for input(s): AST, ALT, ALKPHOS, BILITOT, PROT, ALBUMIN in the last 8760 hours. Recent Labs    11/17/19 0943 08/01/20 1117  WBC  --  3.6  HGB 14.6 14.5  HCT 43.0 41.4  MCV  --  96  PLT  --  179   No results found for: TSH No results found for:  HGBA1C No results found for: CHOL, HDL, LDLCALC, LDLDIRECT, TRIG, CHOLHDL  Significant Diagnostic Results in last 30 days:  No results found.  Assessment/Plan 1. Encounter for medical examination to establish care  Up to date with immunization. Medication and available labs reviewed patient counselled regarding yearly exam, prevention of dental and periodontal disease, diet, regular sustained exercise for at least 30 minutes x 3 /week,COVID-19 hand hygiene, mask and social distancing per CDC guidelines. - advised to schedule appointment to complete MOST form reports has DNR in advance directives.she will come with POA to complete forms next visit.   2. Primary osteoarthritis of left hip Continue on tylenol for pain.   3. Osteopenia of neck of left femur T- score -1.3 on both hips done 09/08/2016  Will order bone density.made aware [facility] will call to schedule appointment.  - DG Bone Density; Future  4. Encounter for screening mammogram for malignant neoplasm of breast Asymptomatic Request order to be send to [facility] where she had mammogram and bone density in the past.  - MM DIGITAL SCREENING BILATERAL; Future  Family/ staff Communication: Reviewed plan of care with patient verbalized understanding  Labs/tests ordered:   - DG Bone Density; Future - MM DIGITAL SCREENING BILATERAL; Future  Next Appointment : 12/07/2020 for Physical Exam,Fasting Labs and complete MOST /DNR form   Sandrea Hughs, NP

## 2020-11-30 ENCOUNTER — Encounter: Payer: Self-pay | Admitting: Family

## 2020-11-30 DIAGNOSIS — Z780 Asymptomatic menopausal state: Secondary | ICD-10-CM | POA: Diagnosis not present

## 2020-11-30 DIAGNOSIS — M85851 Other specified disorders of bone density and structure, right thigh: Secondary | ICD-10-CM | POA: Diagnosis not present

## 2020-12-03 ENCOUNTER — Other Ambulatory Visit: Payer: Self-pay

## 2020-12-03 ENCOUNTER — Other Ambulatory Visit: Payer: PPO

## 2020-12-03 ENCOUNTER — Ambulatory Visit (INDEPENDENT_AMBULATORY_CARE_PROVIDER_SITE_OTHER): Payer: PPO | Admitting: Family

## 2020-12-03 ENCOUNTER — Encounter: Payer: Self-pay | Admitting: Family

## 2020-12-03 VITALS — BP 114/60 | HR 93 | Temp 96.6°F | Resp 18 | Ht 67.0 in | Wt 135.2 lb

## 2020-12-03 DIAGNOSIS — Z1159 Encounter for screening for other viral diseases: Secondary | ICD-10-CM

## 2020-12-03 DIAGNOSIS — Z0001 Encounter for general adult medical examination with abnormal findings: Secondary | ICD-10-CM | POA: Diagnosis not present

## 2020-12-03 DIAGNOSIS — Z789 Other specified health status: Secondary | ICD-10-CM | POA: Diagnosis not present

## 2020-12-03 NOTE — Patient Instructions (Signed)
Health Maintenance, Female Adopting a healthy lifestyle and getting preventive care are important in promoting health and wellness. Ask your health care provider about:  The right schedule for you to have regular tests and exams.  Things you can do on your own to prevent diseases and keep yourself healthy. What should I know about diet, weight, and exercise? Eat a healthy diet  Eat a diet that includes plenty of vegetables, fruits, low-fat dairy products, and lean protein.  Do not eat a lot of foods that are high in solid fats, added sugars, or sodium.   Maintain a healthy weight Body mass index (BMI) is used to identify weight problems. It estimates body fat based on height and weight. Your health care provider can help determine your BMI and help you achieve or maintain a healthy weight. Get regular exercise Get regular exercise. This is one of the most important things you can do for your health. Most adults should:  Exercise for at least 150 minutes each week. The exercise should increase your heart rate and make you sweat (moderate-intensity exercise).  Do strengthening exercises at least twice a week. This is in addition to the moderate-intensity exercise.  Spend less time sitting. Even light physical activity can be beneficial. Watch cholesterol and blood lipids Have your blood tested for lipids and cholesterol at 79 years of age, then have this test every 5 years. Have your cholesterol levels checked more often if:  Your lipid or cholesterol levels are high.  You are older than 79 years of age.  You are at high risk for heart disease. What should I know about cancer screening? Depending on your health history and family history, you may need to have cancer screening at various ages. This may include screening for:  Breast cancer.  Cervical cancer.  Colorectal cancer.  Skin cancer.  Lung cancer. What should I know about heart disease, diabetes, and high blood  pressure? Blood pressure and heart disease  High blood pressure causes heart disease and increases the risk of stroke. This is more likely to develop in people who have high blood pressure readings, are of African descent, or are overweight.  Have your blood pressure checked: ? Every 3-5 years if you are 18-39 years of age. ? Every year if you are 40 years old or older. Diabetes Have regular diabetes screenings. This checks your fasting blood sugar level. Have the screening done:  Once every three years after age 40 if you are at a normal weight and have a low risk for diabetes.  More often and at a younger age if you are overweight or have a high risk for diabetes. What should I know about preventing infection? Hepatitis B If you have a higher risk for hepatitis B, you should be screened for this virus. Talk with your health care provider to find out if you are at risk for hepatitis B infection. Hepatitis C Testing is recommended for:  Everyone born from 1945 through 1965.  Anyone with known risk factors for hepatitis C. Sexually transmitted infections (STIs)  Get screened for STIs, including gonorrhea and chlamydia, if: ? You are sexually active and are younger than 79 years of age. ? You are older than 79 years of age and your health care provider tells you that you are at risk for this type of infection. ? Your sexual activity has changed since you were last screened, and you are at increased risk for chlamydia or gonorrhea. Ask your health care provider   if you are at risk.  Ask your health care provider about whether you are at high risk for HIV. Your health care provider may recommend a prescription medicine to help prevent HIV infection. If you choose to take medicine to prevent HIV, you should first get tested for HIV. You should then be tested every 3 months for as long as you are taking the medicine. Pregnancy  If you are about to stop having your period (premenopausal) and  you may become pregnant, seek counseling before you get pregnant.  Take 400 to 800 micrograms (mcg) of folic acid every day if you become pregnant.  Ask for birth control (contraception) if you want to prevent pregnancy. Osteoporosis and menopause Osteoporosis is a disease in which the bones lose minerals and strength with aging. This can result in bone fractures. If you are 65 years old or older, or if you are at risk for osteoporosis and fractures, ask your health care provider if you should:  Be screened for bone loss.  Take a calcium or vitamin D supplement to lower your risk of fractures.  Be given hormone replacement therapy (HRT) to treat symptoms of menopause. Follow these instructions at home: Lifestyle  Do not use any products that contain nicotine or tobacco, such as cigarettes, e-cigarettes, and chewing tobacco. If you need help quitting, ask your health care provider.  Do not use street drugs.  Do not share needles.  Ask your health care provider for help if you need support or information about quitting drugs. Alcohol use  Do not drink alcohol if: ? Your health care provider tells you not to drink. ? You are pregnant, may be pregnant, or are planning to become pregnant.  If you drink alcohol: ? Limit how much you use to 0-1 drink a day. ? Limit intake if you are breastfeeding.  Be aware of how much alcohol is in your drink. In the [place] one drink equals one 12 oz bottle of beer (355 mL), one 5 oz glass of wine (148 mL), or one 1 oz glass of hard liquor (44 mL). General instructions  Schedule regular health, dental, and eye exams.  Stay current with your vaccines.  Tell your health care provider if: ? You often feel depressed. ? You have ever been abused or do not feel safe at home. Summary  Adopting a healthy lifestyle and getting preventive care are important in promoting health and wellness.  Follow your health care provider's instructions about healthy  diet, exercising, and getting tested or screened for diseases.  Follow your health care provider's instructions on monitoring your cholesterol and blood pressure. This information is not intended to replace advice given to you by your health care provider. Make sure you discuss any questions you have with your health care provider. Document Revised: 07/07/2018 Document Reviewed: 07/07/2018 Elsevier Patient Education  2021 [employer]  

## 2020-12-03 NOTE — Progress Notes (Signed)
Provider: Marlowe Sax FNP-C   Eriverto Byrnes, Nelda Bucks, NP  Patient Care Team: Adriaan Maltese, Nelda Bucks, NP as PCP - General (Family Medicine) Molli Posey, MD as Consulting Physician (Obstetrics and Gynecology) Stanford Breed Denice Bors, MD as Consulting Physician (Cardiology) Stephannie Li, [place] (Ophthalmology)  Extended Emergency Contact Information Primary Emergency Contact: Buth,Tom Address: [phone]          [place] [address] [place] Phone: [phone] Relation: Spouse Secondary Emergency Contact: Stephannie Li Address: [place]          [place], [place] [address] [place] Mobile Phone: [phone] Relation: Daughter  Code Status: Full Code  Goals of care: Advanced Directive information Advanced Directives 12/03/2020  Does Patient Have a Medical Advance Directive? Yes  Type of Advance Directive [place]  Does patient want to make changes to medical advance directive? No - Patient declined  Copy of [place] in Chart? Yes - validated most recent copy scanned in chart (See row information)     Chief Complaint  Patient presents with  . Annual Exam    Physical.  . Health Maintenance    Hepatitis C screening.    HPI:  Pt is a 80 y.o. female seen today for Annual Physical Examination.she is here with Husband to complete MOST form.she denies any acute issues during visit. States in case of a cardiopulmonary event she would like CPR done.She would also like full scope of treatment including transfer to the ICU and use of ventilator.she would like IVF and use of antibiotics as indicated but Tube feeding for a trial period of time.Form completed and signed by patient.  Immunization reviewed all are up to date.  She is due for Hep C screening.does not recall any previous blood transfusion.    Past Medical History:  Diagnosis Date  . Anxiety   . Arthritis    "left knee; left hip; lower back;  mild to moderate in right hip" (09/30/2016)  . Atrial fibrillation ([place])   . Chronic lower back pain   . Gastritis   . Heart palpitations   . History of bone density study 2018  . History of mammogram 2021  . History of MRI 2018  . History of Papanicolaou smear of cervix   . Hypercholesteremia   . Osteopenia   . Seasonal allergies    Past Surgical History:  Procedure Laterality Date  . CARDIOVERSION N/A 11/17/2019   Procedure: CARDIOVERSION;  Surgeon: [name], MD;  Location: [facility] ENDOSCOPY;  Service: Cardiovascular;  Laterality: N/A;  . COLONOSCOPY    . DILATION AND CURETTAGE OF UTERUS    . JOINT REPLACEMENT    . TONSILLECTOMY    . 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 Nsaids Other (See Comments)    Caused painful stomach problems  . Prednisone Other (See Comments)    Caused painful stomach problems    Allergies as of 12/03/2020      Reactions   Nsaids Other (See Comments)   Caused painful stomach problems   Prednisone Other (See Comments)   Caused painful stomach problems      Medication List       Accurate as of Dec 03, 2020 11:30 AM. If you have any questions, ask your nurse or doctor.  Eliquis 5 MG Tabs tablet Generic drug: apixaban TAKE 1 TABLET(5 MG) BY MOUTH TWICE DAILY   GAS-X PO Take 1 capsule by mouth as needed.   loratadine 10 MG tablet Commonly known as: CLARITIN Take 10 mg by mouth daily as needed for allergies.   MELATONIN PO Take 3 mg by mouth daily.   NON FORMULARY Take 2 tablets by mouth daily. BONE-UP   OMEGA-3 FISH OIL-VITAMIN D3 PO Take 3 capsules by mouth daily.   Premarin vaginal cream Generic drug: conjugated estrogens Place 1 Applicatorful vaginally once a week.   TYLENOL PO Take 1 tablet by mouth.       Review of Systems   Constitutional: Negative for appetite change, chills, fatigue, fever and unexpected weight change.  HENT: Negative for congestion, dental problem, ear discharge, ear pain, facial swelling, hearing loss, nosebleeds, postnasal drip, rhinorrhea, sinus pressure, sinus pain, sneezing, sore throat, tinnitus and trouble swallowing.   Eyes: Negative for pain, discharge, redness, itching and visual disturbance.  Respiratory: Negative for cough, chest tightness, shortness of breath and wheezing.   Cardiovascular: Negative for chest pain, palpitations and leg swelling.  Gastrointestinal: Negative for abdominal distention, abdominal pain, blood in stool, constipation, diarrhea, nausea and vomiting.  Endocrine: Negative for cold intolerance, heat intolerance, polydipsia, polyphagia and polyuria.  Genitourinary: Negative for difficulty urinating, dysuria, flank pain, frequency and urgency.  Musculoskeletal: Negative for arthralgias, back pain, gait problem, joint swelling, myalgias, neck pain and neck stiffness.  Skin: Negative for color change, pallor, rash and wound.  Neurological: Negative for dizziness, syncope, speech difficulty, weakness, light-headedness, numbness and headaches.  Hematological: Does not bruise/bleed easily.  Psychiatric/Behavioral: Negative for agitation, behavioral problems, confusion, hallucinations, self-injury, sleep disturbance and suicidal ideas. The patient is not nervous/anxious.     Immunization History  Administered Date(s) Administered  . Influenza Split 04/30/2015, 04/20/2016, 04/27/2018, 06/16/2019  . Influenza, High Dose Seasonal PF 06/28/2017, 06/16/2019, 06/02/2020  . PFIZER(Purple Top)SARS-COV-2 Vaccination 08/12/2019, 09/02/2019, 05/01/2020  . Pneumococcal Conjugate-13 09/30/2013  . Pneumococcal Polysaccharide-23 07/27/2003, 09/24/2010  . Td 04/26/2001, 12/25/2017  . Tdap 09/24/2010  . Zoster 12/11/2006, 04/27/2018, 06/29/2018  . Zoster Recombinat (Shingrix)  11/16/2017, 01/06/2018   Pertinent  Health Maintenance Due  Topic Date Due  . INFLUENZA VACCINE  02/25/2021  . DEXA SCAN  Completed  . PNA vac Low Risk Adult  Completed   Fall Risk  12/03/2020 11/15/2020  Falls in the past year? 0 0  Number falls in past yr: 0 0  Injury with Fall? 0 0   Functional Status Survey:    Vitals:   12/03/20 1021  BP: 114/60  Pulse: 93  Resp: 18  Temp: (!) 96.6 F (35.9 C)  TempSrc: Temporal  SpO2: 98%  Weight: 135 lb 3.2 oz (61.3 kg)  Height: '5\' 7"'  (1.702 m)   Body mass index is 21.18 kg/m. Physical Exam Vitals reviewed.  Constitutional:      General: She is not in acute distress.    Appearance: Normal appearance. She is normal weight. She is not ill-appearing or diaphoretic.  HENT:     Head: Normocephalic.     Right Ear: Tympanic membrane, ear canal and external ear normal. There is no impacted cerumen.     Left Ear: Tympanic membrane, ear canal and external ear normal. There is no impacted cerumen.     Nose: Nose normal. No congestion or rhinorrhea.     Mouth/Throat:     Mouth: Mucous membranes are moist.     Pharynx: Oropharynx is  clear. No oropharyngeal exudate or posterior oropharyngeal erythema.  Eyes:     General: No scleral icterus.       Right eye: No discharge.        Left eye: No discharge.     Extraocular Movements: Extraocular movements intact.     Conjunctiva/sclera: Conjunctivae normal.     Pupils: Pupils are equal, round, and reactive to light.  Neck:     Vascular: No carotid bruit.  Cardiovascular:     Rate and Rhythm: Normal rate and regular rhythm.     Pulses: Normal pulses.     Heart sounds: Normal heart sounds. No murmur heard. No friction rub. No gallop.   Pulmonary:     Effort: Pulmonary effort is normal. No respiratory distress.     Breath sounds: Normal breath sounds. No wheezing, rhonchi or rales.  Chest:     Chest wall: No tenderness.  Abdominal:     General: Bowel sounds are normal. There is no  distension.     Palpations: Abdomen is soft. There is no mass.     Tenderness: There is no abdominal tenderness. There is no right CVA tenderness, left CVA tenderness, guarding or rebound.  Musculoskeletal:        General: No swelling or tenderness. Normal range of motion.     Cervical back: Normal range of motion. No rigidity or tenderness.     Right lower leg: No edema.     Left lower leg: No edema.  Lymphadenopathy:     Cervical: No cervical adenopathy.  Skin:    General: Skin is warm and dry.     Coloration: Skin is not pale.     Findings: No bruising, erythema, lesion or rash.  Neurological:     Mental Status: She is alert and oriented to person, place, and time.     Cranial Nerves: No cranial nerve deficit.     Sensory: No sensory deficit.     Motor: No weakness.     Coordination: Coordination normal.     Gait: Gait normal.     Comments: Scored 29/30 on MMSE   Psychiatric:        Mood and Affect: Mood normal.        Speech: Speech normal.        Behavior: Behavior normal.        Thought Content: Thought content normal.        Judgment: Judgment normal.     Labs reviewed: Recent Labs    08/01/20 1117  NA 143  K 4.7  CL 104  CO2 25  GLUCOSE 75  BUN 15  CREATININE 0.83  CALCIUM 9.6   No results for input(s): AST, ALT, ALKPHOS, BILITOT, PROT, ALBUMIN in the last 8760 hours. Recent Labs    08/01/20 1117  WBC 3.6  HGB 14.5  HCT 41.4  MCV 96  PLT 179   No results found for: TSH No results found for: HGBA1C No results found for: CHOL, HDL, LDLCALC, LDLDIRECT, TRIG, CHOLHDL  Significant Diagnostic Results in last 30 days:  No results found.  Assessment/Plan  1. Encounter for general adult medical examination with abnormal findings Up to date with immunization. Medication and labs reviewed patient counselled regarding yearly exam, prevention of dental and periodontal disease, diet, regular sustained exercise for at least 30 minutes x 3 /week,COVID-19 hand  hygiene, mask and social distancing per CDC guidelines. Proper use of sun screen and protective clothing, recommended schedule for routine labs. Fall screening and  MMSE scored 29/30 up dated. Goals of care discussion done. - Additional education information on Health maintenance - CBC with Differential/Platelet - CMP with eGFR([facility]) - Lipid Panel - TSH - Hep C Antibody  2. Encounter for hepatitis C screening test for low risk patient No previous blood transfusion or high risk behaviors.  - Hep C Antibody  3. Full code status Reviewed MOST form with patient and spouse present during visit. Patient wishes to be resuscitated in case of cardiopulmonary event with full scope of treatment including transfer to ICU and intubation.Use of  I.V fluids and antibiotic for defined trial period if indicated.feeding tube for a trial period of time. MOST form has been filled out today. Reviewed goals of care and filled MOST form between 10:20-11:00 am. Answered question/ concern from patient and spouse to the best of my knowledge.  - Full code  Family/ staff Communication: Reviewed plan of care with patient and Husband verbalized understanding  Labs/tests ordered: - CBC with Differential/Platelet - CMP with eGFR([facility]) - TSH - Hgb A1C - Lipid panel - Vitamin B 12 - Hep C Antibody)  Next Appointment : one year for annual Physical examination   Sandrea Hughs, NP

## 2020-12-04 LAB — COMPLETE METABOLIC PANEL WITH GFR
AG Ratio: 2.3 (calc) (ref 1.0–2.5)
ALT: 21 U/L (ref 6–29)
AST: 32 U/L (ref 10–35)
Albumin: 4.2 g/dL (ref 3.6–5.1)
Alkaline phosphatase (APISO): 47 U/L (ref 37–153)
BUN: 19 mg/dL (ref 7–25)
CO2: 29 mmol/L (ref 20–32)
Calcium: 9.1 mg/dL (ref 8.6–10.4)
Chloride: 106 mmol/L (ref 98–110)
Creat: 0.73 mg/dL (ref 0.60–0.93)
GFR, Est African American: 91 mL/min/{1.73_m2} (ref >=60)
GFR, Est Non African American: 79 mL/min/{1.73_m2} (ref >=60)
Globulin: 1.8 g/dL (calc) — ABNORMAL LOW (ref 1.9–3.7)
Glucose, Bld: 77 mg/dL (ref 65–99)
Potassium: 4.2 mmol/L (ref 3.5–5.3)
Sodium: 141 mmol/L (ref 135–146)
Total Bilirubin: 0.8 mg/dL (ref 0.2–1.2)
Total Protein: 6 g/dL — ABNORMAL LOW (ref 6.1–8.1)

## 2020-12-04 LAB — TSH: TSH: 2.9 mIU/L (ref 0.40–4.50)

## 2020-12-04 LAB — CBC WITH DIFFERENTIAL/PLATELET
Absolute Monocytes: 483 cells/uL (ref 200–950)
Basophils Absolute: 30 cells/uL (ref 0–200)
Basophils Relative: 0.8 %
Eosinophils Absolute: 122 cells/uL (ref 15–500)
Eosinophils Relative: 3.2 %
HCT: 40.8 % (ref 35.0–45.0)
Hemoglobin: 13.7 g/dL (ref 11.7–15.5)
Lymphs Abs: 1649 cells/uL (ref 850–3900)
MCH: 32.9 pg (ref 27.0–33.0)
MCHC: 33.6 g/dL (ref 32.0–36.0)
MCV: 97.8 fL (ref 80.0–100.0)
MPV: 10.5 fL (ref 7.5–12.5)
Monocytes Relative: 12.7 %
Neutro Abs: 1516 cells/uL (ref 1500–7800)
Neutrophils Relative %: 39.9 %
Platelets: 168 10*3/uL (ref 140–400)
RBC: 4.17 10*6/uL (ref 3.80–5.10)
RDW: 11.4 % (ref 11.0–15.0)
Total Lymphocyte: 43.4 %
WBC: 3.8 10*3/uL (ref 3.8–10.8)

## 2020-12-04 LAB — LIPID PANEL
Cholesterol: 189 mg/dL (ref <200)
HDL: 59 mg/dL (ref >=50)
LDL Cholesterol (Calc): 116 mg/dL (calc) — ABNORMAL HIGH
Non-HDL Cholesterol (Calc): 130 mg/dL (calc) — ABNORMAL HIGH (ref <130)
Total CHOL/HDL Ratio: 3.2 (calc) (ref <5.0)
Triglycerides: 50 mg/dL (ref <150)

## 2020-12-04 LAB — HEPATITIS C ANTIBODY
Hepatitis C Ab: NONREACTIVE
SIGNAL TO CUT-OFF: 0 (ref <1.00)

## 2020-12-07 ENCOUNTER — Ambulatory Visit: Payer: PPO | Admitting: Family

## 2020-12-07 ENCOUNTER — Other Ambulatory Visit: Payer: PPO

## 2020-12-11 ENCOUNTER — Other Ambulatory Visit: Payer: Self-pay | Admitting: Family Medicine

## 2020-12-11 DIAGNOSIS — D229 Melanocytic nevi, unspecified: Secondary | ICD-10-CM

## 2020-12-17 ENCOUNTER — Telehealth: Payer: Self-pay | Admitting: Dermatology

## 2020-12-17 NOTE — Telephone Encounter (Signed)
Notes documented in referral notes.

## 2020-12-17 NOTE — Telephone Encounter (Signed)
Referral from [facility]. I left her a message telling her 1st new patient appt. is November + she should call back if that's OK

## 2021-01-07 DIAGNOSIS — Z03818 Encounter for observation for suspected exposure to other biological agents ruled out: Secondary | ICD-10-CM | POA: Diagnosis not present

## 2021-02-15 ENCOUNTER — Telehealth: Payer: PPO | Admitting: Family

## 2021-02-15 NOTE — Telephone Encounter (Signed)
Patient called asking about a record release for she said [facility], She was asking if it would be easier if we requested the records. I told her it would be the same either way if she filled out their form or if she filled out the one in our office so she is going to fill that out and [facility] to [facility].

## 2021-03-22 ENCOUNTER — Telehealth: Payer: Self-pay

## 2021-03-22 ENCOUNTER — Telehealth: Payer: Self-pay | Admitting: Cardiology

## 2021-03-22 ENCOUNTER — Telehealth (INDEPENDENT_AMBULATORY_CARE_PROVIDER_SITE_OTHER): Payer: PPO | Admitting: Family

## 2021-03-22 ENCOUNTER — Encounter: Payer: Self-pay | Admitting: Family

## 2021-03-22 ENCOUNTER — Other Ambulatory Visit: Payer: Self-pay

## 2021-03-22 ENCOUNTER — Telehealth: Payer: Self-pay | Admitting: *Deleted

## 2021-03-22 DIAGNOSIS — I480 Paroxysmal atrial fibrillation: Secondary | ICD-10-CM | POA: Insufficient documentation

## 2021-03-22 DIAGNOSIS — I4819 Other persistent atrial fibrillation: Secondary | ICD-10-CM | POA: Diagnosis not present

## 2021-03-22 DIAGNOSIS — I4891 Unspecified atrial fibrillation: Secondary | ICD-10-CM | POA: Insufficient documentation

## 2021-03-22 NOTE — Telephone Encounter (Signed)
[name], maslanka are scheduled for a virtual visit with your provider today.    Just as we do with appointments in the office, we must obtain your consent to participate.  Your consent will be active for this visit and any virtual visit you may have with one of our providers in the next 365 days.    If you have a MyChart account, I can also send a copy of this consent to you electronically.  All virtual visits are billed to your insurance company just like a traditional visit in the office.  As this is a virtual visit, video technology does not allow for your provider to perform a traditional examination.  This may limit your provider's ability to fully assess your condition.  If your provider identifies any concerns that need to be evaluated in person or the need to arrange testing such as labs, EKG, etc, we will make arrangements to do so.    Although advances in technology are sophisticated, we cannot ensure that it will always work on either your end or our end.  If the connection with a video visit is poor, we may have to switch to a telephone visit.  With either a video or telephone visit, we are not always able to ensure that we have a secure connection.   I need to obtain your verbal consent now.   Are you willing to proceed with your visit today?   Shelia [name] has provided verbal consent on 03/22/2021 for a virtual visit (video or telephone).   [name], [place] 03/22/2021  3:56 PM

## 2021-03-22 NOTE — Telephone Encounter (Signed)
  pt called in stating that she thinks her rapid heartbeat is back and she would like an ekg... please advise

## 2021-03-22 NOTE — Progress Notes (Signed)
This service is provided via telemedicine  No vital signs collected/recorded due to the encounter was a telemedicine visit.   Location of patient (ex: home, work):  Home  Patient consents to a telephone visit: Yes, see telephone visit dated 03/22/21  Location of the provider (ex: office, home):  [facility], Office   Name of any referring provider:  N/A  Names of all persons participating in the telemedicine service and their role in the encounter:  S.Chrae B/CMA, Marlowe Sax, NP, and Patient   Time spent on call:  6 min with medical assistant    Provider: Shamera Yarberry FNP-C  Shelia [name], Nelda Bucks, NP  Patient Care Team: Shelia [name], Nelda Bucks, NP as PCP - General (Family Medicine) Molli Posey, MD as Consulting Physician (Obstetrics and Gynecology) Stanford Breed Denice Bors, MD as Consulting Physician (Cardiology) Stephannie Li, [place] (Ophthalmology)  Extended Emergency Contact Information Primary Emergency Contact: Lenhoff,Tom Address: [address]          [place] [address] [place] [employer] [place] Phone: [phone] Relation: Spouse Secondary Emergency Contact: Stephannie Li Address: [address]          [place], [place] [address] [place] Phone: [phone] Relation: Daughter  Code Status: Full Code  Goals of care: Advanced Directive information Advanced Directives 12/03/2020  Does Patient Have a Medical Advance Directive? Yes  Type of Advance Directive [place]  Does patient want to make changes to medical advance directive? No - Patient declined  Copy of [place] in Chart? Yes - validated most recent copy scanned in chart (See row information)     Chief Complaint  Patient presents with   Acute Visit    Patient c/o of SOB and possible a-fib since yesterday.     HPI:  Pt is a 80 y.o. female seen today for an acute visit for evaluation of shortness of breath  and possible Afib yesterday.Feeling tired and had rapid heart beat worst with walking around.Heart rate improve when sitting.States had similar symptoms march 30 th 2021.She denies any denies any headache,dizziness,vision changes,chest tightness or chest pain.she has a medical history of Afib on Eliquis.Has been taking as directed.  States HR 80 b/min and B/p 108/77 she rechecked during the visit HR 70 b/min and B/p 107/66  Denies any exposure to sick person with COVID-19.Has had no fever,chills or acute illness. Has also placed a call to her Cardiologist waiting for response.    Past Medical History:  Diagnosis Date   Anxiety    Arthritis    "left knee; left hip; lower back; mild to moderate in right hip" (09/30/2016)   Atrial fibrillation (HCC)    Chronic lower back pain    Gastritis    Heart palpitations    History of bone density study 2018   History of mammogram 2021   History of MRI 2018   History of Papanicolaou smear of cervix    Hypercholesteremia    Osteopenia    Seasonal allergies    Past Surgical History:  Procedure Laterality Date   CARDIOVERSION N/A 11/17/2019   Procedure: CARDIOVERSION;  Surgeon: [name], MD;  Location: [place];  Service: Cardiovascular;  Laterality: N/A;   COLONOSCOPY     DILATION AND CURETTAGE OF UTERUS     JOINT REPLACEMENT     TONSILLECTOMY     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   amoxicillin (AMOXIL) 500 MG capsule 2,000 mg as directed. Prior to dental procedures   conjugated estrogens  (PREMARIN) vaginal cream Place 1 Applicatorful vaginally once a week.   ELIQUIS 5 MG TABS tablet TAKE 1 TABLET(5 MG) BY MOUTH TWICE DAILY   Fish Oil-Cholecalciferol (OMEGA-3 FISH OIL-VITAMIN D3 PO) Take 3 capsules by mouth daily.   loratadine (CLARITIN) 10 MG tablet Take 10 mg by mouth daily as needed for allergies.   MELATONIN PO Take 3 mg by mouth daily.   NON FORMULARY Take 2 tablets by mouth daily. BONE-UP   Simethicone (GAS-X PO) Take 1 capsule by mouth as needed.   No facility-administered encounter medications on file as of 03/22/2021.    Review of Systems  Constitutional:  Positive for fatigue. Negative for appetite change, chills, fever and unexpected weight change.  HENT:  Negative for congestion, dental problem, ear discharge, ear pain, facial swelling, hearing loss, nosebleeds, postnasal drip, rhinorrhea, sinus pressure, sinus pain, sneezing, sore throat and tinnitus.   Eyes:  Negative for pain, discharge, redness, itching and visual disturbance.  Respiratory:  Negative for cough, chest tightness, shortness of breath and wheezing.   Cardiovascular:  Positive for palpitations. Negative for chest pain and leg swelling.  Gastrointestinal:  Negative for abdominal distention, abdominal pain, blood in stool, constipation, diarrhea, nausea and vomiting.  Musculoskeletal:  Negative for arthralgias, back pain, gait problem, joint swelling, myalgias, neck pain and neck stiffness.  Skin:  Negative for color change, pallor, rash and wound.  Neurological:  Negative for dizziness, syncope, speech difficulty, weakness, light-headedness, numbness and headaches.  Hematological:  Does not bruise/bleed easily.  Psychiatric/Behavioral:  Negative for agitation, behavioral problems, confusion, hallucinations and sleep disturbance. The patient is not nervous/anxious.    Immunization History  Administered Date(s) Administered   Influenza Split 04/30/2015, 04/20/2016, 04/27/2018, 06/16/2019   Influenza,  High Dose Seasonal PF 06/28/2017, 06/16/2019, 06/02/2020   PFIZER(Purple Top)SARS-COV-2 Vaccination 08/12/2019, 09/02/2019, 05/01/2020   Pneumococcal Conjugate-13 09/30/2013   Pneumococcal Polysaccharide-23 07/27/2003, 09/24/2010   Td 04/26/2001, 12/25/2017   Tdap 09/24/2010   Zoster Recombinat (Shingrix) 11/16/2017, 01/06/2018   Zoster, Live 12/11/2006, 04/27/2018, 06/29/2018   Pertinent  Health Maintenance Due  Topic Date Due   INFLUENZA VACCINE  02/25/2021   DEXA SCAN  Completed   PNA vac Low Risk Adult  Completed   Fall Risk  12/03/2020 11/15/2020  Falls in the past year? 0 0  Number falls in past yr: 0 0  Injury with Fall? 0 0   Functional Status Survey:    Vitals:   03/22/21 1551  BP: 108/77  Pulse: 80   There is no height or weight on file to calculate BMI. Physical Exam Unable to complete on telephone visit   Labs reviewed: Recent Labs    08/01/20 1117 12/03/20 1153  NA 143 141  K 4.7 4.2  CL 104 106  CO2 25 29  GLUCOSE 75 77  BUN 15 19  CREATININE 0.83 0.73  CALCIUM 9.6 9.1   Recent Labs    12/03/20 1153  AST 32  ALT 21  BILITOT 0.8  PROT 6.0*   Recent Labs    08/01/20 1117 12/03/20 1153  WBC 3.6 3.8  NEUTROABS  --  1,516  HGB 14.5 13.7  HCT 41.4 40.8  MCV 96 97.8  PLT 179 168   Lab Results  Component Value Date   TSH 2.90 12/03/2020   No results found for: HGBA1C Lab Results  Component Value Date   CHOL 189 12/03/2020   HDL 59 12/03/2020   LDLCALC 116 (H) 12/03/2020   TRIG 50 12/03/2020   CHOLHDL 3.2 12/03/2020    Significant Diagnostic Results in last 30 days:  No results found.  Assessment/Plan   Persistent atrial fibrillation (HCC) Reports irregular pounding heart rate with shortness of breath since yesterday. No chest pain. Recommended to go to ED for evaluation of palpitation given it's late afternoon Friday unable to make to our office for EKG.Has also called her Cardiology still waiting for response.she will go to ED  or urgent Care for evaluation.   Family/ staff Communication: Reviewed plan of care with patient  Labs/tests ordered: None   Next Appointment: As needed if symptoms worsen or fail to improve   I connected with  Shelia [name] on 03/22/21 by a video enabled telemedicine application and verified that I am speaking with the correct person using two identifiers.   I discussed the limitations of evaluation and management by telemedicine. The patient expressed understanding and agreed to proceed.  Spent 12 minutes of face to face with patient     Sandrea Hughs, NP

## 2021-03-22 NOTE — Telephone Encounter (Signed)
Pt called reporting she feels like she is flipping in and out of afib again. She report yesterday while walking [place] she felt more winded. Today she report she felt like her heart was racing while just walking from the parking lot to the store. Pt report symptoms usually resolve with rest and right now feels fine.   Current vitals  BP 108/77 HR 80  Appointment scheduled for 8/30 with Dorene Sorrow, PA for further evaluations. Pt verbalized understanding.

## 2021-03-22 NOTE — Patient Instructions (Signed)
Please go to ED or urgent care for evaluation of palpitation and shortness of breath

## 2021-03-22 NOTE — Telephone Encounter (Signed)
Patient called and stated that she would like to speak with Shelia [name] concerning AFIB Symptoms. Stated that her Pulse is 82 and BP 108/77 with irregular pulse and SOB.  Patient has also placed a call to her Cardiologist but haven't heard anything back yet.  MyChart Visit Scheduled with Shelia [name].

## 2021-03-23 ENCOUNTER — Encounter (HOSPITAL_COMMUNITY): Payer: Self-pay | Admitting: Emergency Medicine

## 2021-03-23 ENCOUNTER — Other Ambulatory Visit: Payer: Self-pay

## 2021-03-23 ENCOUNTER — Ambulatory Visit (HOSPITAL_COMMUNITY)
Admission: EM | Admit: 2021-03-23 | Discharge: 2021-03-23 | Disposition: A | Discharge status: Home / Self Care | Payer: PPO | Attending: Student | Admitting: Student

## 2021-03-23 DIAGNOSIS — I480 Paroxysmal atrial fibrillation: Secondary | ICD-10-CM | POA: Diagnosis not present

## 2021-03-23 NOTE — ED Triage Notes (Signed)
Patient reports with movement, she is sob.  Patient reports this feels similar to initial diagnosis 1 year ago when diagnosed with atrial fibrillation.

## 2021-03-23 NOTE — ED Provider Notes (Addendum)
[place]    CSN: [id number] Arrival date & time: 03/23/21  1240      History   Chief Complaint Chief Complaint  Patient presents with   Shortness of Breath    HPI Shelia [name] is a 80 y.o. female. Presenting with shortness of breath x2 days. Medical history A. fib.  She is status post ablation 1.5 years ago, states that she has been out of A. fib since then.  She is taking Eliquis for anticoagulation.  Today presenting with 2 days of shortness of breath, worse with exertion.  Denies absolutely any other symptoms, including chest pain, dizziness, weakness, swelling, pedal edema, headaches.  States her cardiologist told her to go to urgent care.  She does note that she has recently increased her alcohol intake.  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   Urinary incontinence 06/25/2016   Lumbar radiculopathy 06/18/2016   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   Abnormality of gait 02/02/2013   Nonallopathic lesion of lumbar region 04/14/2011   OSTEOARTHRITIS, HIP, LEFT 11/14/2009   METATARSALGIA 11/14/2009   BUNION, RIGHT FOOT 11/14/2009   HIP PAIN, LEFT 10/02/2009   Pain in joint, lower leg 10/02/2009   LOW BACK PAIN, CHRONIC 10/02/2009    Past Surgical History:  Procedure Laterality Date   CARDIOVERSION N/A 11/17/2019   Procedure: CARDIOVERSION;  Surgeon: [name], MD;  Location: [facility] ENDOSCOPY;  Service: Cardiovascular;  Laterality:  N/A;   COLONOSCOPY     DILATION AND CURETTAGE OF UTERUS     JOINT REPLACEMENT     TONSILLECTOMY     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   Procedure: TOTAL HIP ARTHROPLASTY ANTERIOR APPROACH;  Surgeon: Dorna Leitz, MD;  Location: [facility];  Service: Orthopedics;  Laterality: Left;    OB History   No obstetric history on file.      Home Medications    Prior to Admission medications   Medication Sig Start Date End Date Taking? Authorizing Provider  Acetaminophen (TYLENOL PO) Take 1 tablet by mouth as needed.    [provider]  amoxicillin (AMOXIL) 500 MG capsule 2,000 mg as directed. Prior to dental procedures 11/27/20   [provider]  conjugated estrogens (PREMARIN) vaginal cream Place 1 Applicatorful vaginally once a week.    [provider]  ELIQUIS 5 MG TABS tablet TAKE 1 TABLET(5 MG) BY MOUTH TWICE DAILY 10/26/20   [name], MD  Fish Oil-Cholecalciferol (OMEGA-3 FISH OIL-VITAMIN D3 PO) Take 3 capsules by mouth daily.    [provider]  loratadine (CLARITIN) 10 MG tablet Take 10 mg by mouth daily as needed for allergies.    [provider]  MELATONIN PO Take 3 mg by mouth daily.    [provider]  NON FORMULARY Take 2 tablets by mouth daily. BONE-UP  [provider]  Simethicone (GAS-X PO) Take 1 capsule by mouth as needed.    [provider]    Family History Family History  Problem Relation Age of Onset   Hypertension Mother    Cancer Father    Diabetes Sister    Anxiety disorder Daughter    Breast cancer Neg Hx     Social History Social History   Tobacco Use   Smoking status: Former    Packs/day: 2.00    Types: Cigarettes   Smokeless tobacco: Never   Tobacco comments:    "quit smoking mid-late 1980s"  Vaping Use   Vaping Use: Never used  Substance Use Topics   Alcohol use: Yes    Comment: 3 drinks weekly   Drug use: No     Allergies   Nsaids and Prednisone   Review of  Systems Review of Systems  Respiratory:  Positive for shortness of breath.   All other systems reviewed and are negative.   Physical Exam Triage Vital Signs ED Triage Vitals  Enc Vitals Group     BP 03/23/21 1325 109/62     Pulse Rate 03/23/21 1325 72     Resp 03/23/21 1325 18     Temp 03/23/21 1325 98.2 F (36.8 C)     Temp Source 03/23/21 1325 Oral     SpO2 03/23/21 1325 98 %     Weight --      Height --      Head Circumference --      Peak Flow --      Pain Score 03/23/21 1322 0     Pain Loc --      Pain Edu? --      Excl. in [place]? --    No data found.  Updated Vital Signs BP 109/62 (BP Location: Right Arm)   Pulse 72   Temp 98.2 F (36.8 C) (Oral)   Resp 18   SpO2 98%   Visual Acuity Right Eye Distance:   Left Eye Distance:   Bilateral Distance:    Right Eye Near:   Left Eye Near:    Bilateral Near:     Physical Exam Vitals reviewed.  Constitutional:      Appearance: Normal appearance. She is not diaphoretic.  HENT:     Head: Normocephalic and atraumatic.     Mouth/Throat:     Mouth: Mucous membranes are moist.  Eyes:     Extraocular Movements: Extraocular movements intact.     Pupils: Pupils are equal, round, and reactive to light.  Cardiovascular:     Rate and Rhythm: Normal rate and regular rhythm.     Pulses:          Radial pulses are 2+ on the right side and 2+ on the left side.     Heart sounds: Normal heart sounds.     Comments: Radial pulses irregularly regular Pulmonary:     Effort: Pulmonary effort is normal.     Breath sounds: Normal breath sounds.  Abdominal:     Palpations: Abdomen is soft.     Tenderness: There is no abdominal tenderness. There is no guarding or rebound.  Musculoskeletal:     Right lower leg: No edema.     Left lower leg: No edema.  Skin:    General: Skin is warm.     Capillary Refill: Capillary refill takes less than 2 seconds.  Neurological:     General: No focal deficit present.  Mental Status: She is  alert and oriented to person, place, and time.  Psychiatric:        Mood and Affect: Mood normal.        Behavior: Behavior normal.        Thought Content: Thought content normal.        Judgment: Judgment normal.     UC Treatments / Results  Labs (all labs ordered are listed, but only abnormal results are displayed) Labs Reviewed - No data to display  EKG   Radiology No results found.  Procedures Procedures (including critical care time)  Medications Ordered in UC Medications - No data to display  Initial Impression / Assessment and Plan / UC Course  I have reviewed the triage vital signs and the nursing notes.  Pertinent labs & imaging results that were available during my care of the patient were reviewed by me and considered in my medical decision making (see chart for details).     This patient is a very pleasant 80 y.o. year old female presenting with afib. Pt was successfully cardioverted 1.5 years ago. Pulse 72 today, running 70s and 80s at home.  Currently her only symptom is DOE. She is on long term anticoagulation: Eliquis. Discussed risks of afib, including clotting and stroke. I am amenable to her following up with her cardiologist outpatient in 2 days. But she understands if ANY new symptoms, she must head to ED or call 911. Patient verbalizes understanding and agreement multiple times.   Level 4: acute illness with systemic symptoms, review of past notes, order and interpretation of tests today.  Final Clinical Impressions(s) / UC Diagnoses   Final diagnoses:  Paroxysmal A-fib [facility])     Discharge Instructions      -You are unfortunately back in a fib.  -Please keep a close eye on your symptoms. If any worsening shortness of breath, new dizziness, chest pain, weakness, swelling in arms/legs, headaches, etc- head to the ED. -Monitor your pulse at home 3x daily. This should stay below 100.  -You can also monitor your bloodpressure 1x daily. This should  stay below 140/90.  -Call your cardiologist next business day (Monday) to discuss further management.   ED Prescriptions   None    PDMP not reviewed this encounter.   Hazel Sams, PA-C 03/23/21 1426    Hazel Sams, PA-C 03/23/21 1435

## 2021-03-23 NOTE — Discharge Instructions (Addendum)
-  You are unfortunately back in a fib.  -Please keep a close eye on your symptoms. If any worsening shortness of breath, new dizziness, chest pain, weakness, swelling in arms/legs, headaches, etc- head to the ED. -Monitor your pulse at home 3x daily. This should stay below 100.  -You can also monitor your bloodpressure 1x daily. This should stay below 140/90.  -Call your cardiologist next business day (Monday) to discuss further management.

## 2021-03-25 NOTE — Progress Notes (Addendum)
Cardiology Office Note   Date:  03/26/2021   ID:  Shelia [name], DOB 11/20/1940, MRN [id number]  PCP:  Sandrea Hughs, NP  Cardiologist:  Kirk Ruths, MD EP: None  Chief Complaint  Patient presents with   Atrial Fibrillation      History of Present Illness: Shelia [name] is a 80 y.o. female with a PMH of paroxysmal atrial fibrillation and HLD, who presents for evaluation of DOE and recurrent Afib.  She was last evaluated by cardiology at an outpatient visit with Dr. Stanford Breed 07/2020, at which time she was doing well from a cardiac standpoint with only occasional brief flutters in her throat which was no sustained. She was maintaining sinus rhythm at that visit following a cardioversion for management of persistent atrial fibrillation 10/2019. No medication changes occurred and she was recommended to follow-up in 6 months. Consideration for antiarrhythmic vs ablations was mentioned should she have increasing frequency of Afib in the future. Her last echocardiogram in 2021 showed EF 60-65%, indeterminate LV diastolic function, no RWMA, moderate LAE, and mild MR.   She contacted our office 03/22/21 to report feeling like she was experiencing more paroxysmal atrial fibrillation. She noted increased DOE recently and racing heart beat sensation walking from the parking lot to the store. She was scheduled for the appointment to further evaluate symptoms. Following this call, she presented to urgent care 03/23/21 with complaints of DOE and was found to be back in atrial fibrillation at that time. Compliance with eliquis was encouraged, as well as following up with cardiology as scheduled.  She presents today for follow-up of her recurrent atrial fibrillation. She reports starting last Thursday, 03/21/21 she noticed shortness of breath after doing her usual walk at [facility] which was out of the ordinary. She reported having similar symptoms with her last episode of atrial  fibrillation 10/2019. She has not noticed significant palpitations with this episode but persistent DOE and mild fatigue. She has not had any significant chest pain, orthopnea, PND LE edema, dizziness, lightheadedness, or syncope. She reports occasional twinges in her chest which last for seconds and resolve spontaneously - these are not frequent occurrences. She has never had an ischemic evaluation in the past. She reports missing 2-3 doses of eliquis in the past month but does not recall when her last missed dose is.     Past Medical History:  Diagnosis Date   Anxiety    Arthritis    "left knee; left hip; lower back; mild to moderate in right hip" (09/30/2016)   Atrial fibrillation (HCC)    Chronic lower back pain    Gastritis    Heart palpitations    History of bone density study 2018   History of mammogram 2021   History of MRI 2018   History of Papanicolaou smear of cervix    Hypercholesteremia    Osteopenia    Seasonal allergies     Past Surgical History:  Procedure Laterality Date   CARDIOVERSION N/A 11/17/2019   Procedure: CARDIOVERSION;  Surgeon: [name], MD;  Location: [facility];  Service: Cardiovascular;  Laterality: N/A;   COLONOSCOPY     DILATION AND CURETTAGE OF UTERUS     JOINT REPLACEMENT     TONSILLECTOMY     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   Procedure: TOTAL HIP ARTHROPLASTY ANTERIOR APPROACH;  Surgeon: Dorna Leitz, MD;  Location: [place];  Service: Orthopedics;  Laterality: Left;     Current Outpatient Medications  Medication Sig Dispense Refill   Acetaminophen (TYLENOL PO) Take 1 tablet by mouth as needed.     amoxicillin (AMOXIL) 500 MG capsule 2,000 mg as directed. Prior to dental procedures     conjugated estrogens (PREMARIN) vaginal cream Place 1 Applicatorful vaginally once a week.     ELIQUIS 5 MG TABS tablet TAKE 1 TABLET(5 MG) BY MOUTH TWICE DAILY 180 tablet 1   Fish Oil-Cholecalciferol (OMEGA-3 FISH OIL-VITAMIN D3 PO) Take 3 capsules  by mouth daily.     loratadine (CLARITIN) 10 MG tablet Take 10 mg by mouth daily as needed for allergies.     MELATONIN PO Take 3 mg by mouth daily.     metoprolol tartrate (LOPRESSOR) 50 MG tablet Take 1 tablet (50 mg total) by mouth 2 (two) times daily. 1 tablet 0   NON FORMULARY Take 2 tablets by mouth daily. BONE-UP     Simethicone (GAS-X PO) Take 1 capsule by mouth as needed.     No current facility-administered medications for this visit.    Allergies:   Nsaids and Prednisone    Social History:  The patient  reports that she has quit smoking. Her smoking use included cigarettes. She smoked an average of 2 packs per day. She has never used smokeless tobacco. She reports current alcohol use. She reports that she does not use drugs.   Family History:  The patient's family history includes Anxiety disorder in her daughter; Cancer in her father; Diabetes in her sister; Hypertension in her mother.    ROS:  Please see the history of present illness.   Otherwise, review of systems are positive for none.   All other systems are reviewed and negative.    PHYSICAL EXAM: VS:  BP 118/78   Pulse 65   Ht '5\' 7"'$  (1.702 m)   Wt 134 lb (60.8 kg)   SpO2 98%   BMI 20.99 kg/m  , BMI Body mass index is 20.99 kg/m. GEN: Well nourished, well developed, in no acute distress HEENT: sclera anicteric Neck: no JVD, carotid bruits, or masses Cardiac: IRIR; no murmurs, rubs, or gallops,no edema  Respiratory:  clear to auscultation bilaterally, normal work of breathing GI: soft, nontender, nondistended, + BS MS: no deformity or atrophy Skin: warm and dry, no rash Neuro:  Strength and sensation are intact Psych: euthymic mood, full affect   EKG:  EKG is ordered today. The ekg ordered today demonstrates atrial fibrillation, rate 65 bpm, no STE/D   Recent Labs: 12/03/2020: ALT 21; BUN 19; Creat 0.73; Hemoglobin 13.7; Platelets 168; Potassium 4.2; Sodium 141; TSH 2.90    Lipid Panel    Component  Value Date/Time   CHOL 189 12/03/2020 1153   TRIG 50 12/03/2020 1153   HDL 59 12/03/2020 1153   CHOLHDL 3.2 12/03/2020 1153   LDLCALC 116 (H) 12/03/2020 1153      Wt Readings from Last 3 Encounters:  03/26/21 134 lb (60.8 kg)  12/03/20 135 lb 3.2 oz (61.3 kg)  11/15/20 135 lb 9.6 oz (61.5 kg)      Other studies Reviewed: Additional studies/ records that were reviewed today include:   Echocardiogram 10/2019: 1. Left ventricular ejection fraction, by estimation, is 60 to 65%. The  left ventricle has normal function. The left ventricle has no regional  wall motion abnormalities. Left ventricular diastolic parameters are  indeterminate.   2. Right ventricular systolic function is normal. The right ventricular  size is normal. There is normal pulmonary artery systolic pressure.  3. Left atrial size was moderately dilated.   4. The mitral valve is normal in structure. Mild mitral valve  regurgitation. No evidence of mitral stenosis.   5. The aortic valve is normal in structure. Aortic valve regurgitation is  trivial. No aortic stenosis is present.   6. The inferior vena cava is normal in size with greater than 50%  respiratory variability, suggesting right atrial pressure of 3 mmHg.     ASSESSMENT AND PLAN:  1. Paroxysmal atrial fibrillation: s/p DCCV 10/2019. Now appears to be back in persistent atrial fibrillation after recent complaints of DOE. TSH 11/2020 was wnl. She has been off AV nodal blocking agents given baseline bradycardia. She is not feeling so poorly that a TEE/DCCV needs to be pursued.  - Will plan for DCCV  04/16/21 following 3 weeks on uninturrupted anticoagulation.  - Shared Decision Making/Informed Consent The risks (stroke, cardiac arrhythmias rarely resulting in the need for a temporary or permanent pacemaker, skin irritation or burns and complications associated with conscious sedation including aspiration, arrhythmia, respiratory failure and death), benefits  (restoration of normal sinus rhythm) and alternatives of a direct current cardioversion were explained in detail to Shelia [name] and she agrees to proceed.  - Given recurrent atrial fibrillation, will plan to repeat a coronary CTA after DCCV to r/o ischemia - Continue eliquis for stroke ppx - importance of compliance stressed - Will send referral to EP for recurrent symptomatic Afib and consideration for antiarrhythmic medications vs ablation   2. HLD: LDL 116 11/2020  - Continue fish oil - Information provided to help lower cholesterol - Will determine need for more aggressive management pending coronary CTA results.    Current medicines are reviewed at length with the patient today.  The patient does not have concerns regarding medicines.  The following changes have been made:  As above  Labs/ tests ordered today include:   Orders Placed This Encounter  Procedures   CT CORONARY MORPH W/CTA COR W/SCORE W/CA W/CM &/OR WO/CM   Basic metabolic panel   CBC   Ambulatory referral to Cardiac Electrophysiology   EKG 12-Lead      Disposition:   FU with myself or Dr. Stanford Breed in 2 months  Signed, Abigail Butts, PA-C  03/26/2021 12:07 PM

## 2021-03-25 NOTE — H&P (View-Only) (Signed)
Cardiology Office Note   Date:  03/26/2021   ID:  Shelia [name], DOB [birth year], MRN [id number]  PCP:  Sandrea Hughs, NP  Cardiologist:  Kirk Ruths, MD EP: None  Chief Complaint  Patient presents with   Atrial Fibrillation      History of Present Illness: Shelia [name] is a 80 y.o. female with a PMH of paroxysmal atrial fibrillation and HLD, who presents for evaluation of DOE and recurrent Afib.  She was last evaluated by cardiology at an outpatient visit with Dr. Stanford Breed 07/2020, at which time she was doing well from a cardiac standpoint with only occasional brief flutters in her throat which was no sustained. She was maintaining sinus rhythm at that visit following a cardioversion for management of persistent atrial fibrillation 10/2019. No medication changes occurred and she was recommended to follow-up in 6 months. Consideration for antiarrhythmic vs ablations was mentioned should she have increasing frequency of Afib in the future. Her last echocardiogram in 2021 showed EF 60-65%, indeterminate LV diastolic function, no RWMA, moderate LAE, and mild MR.   She contacted our office 03/22/21 to report feeling like she was experiencing more paroxysmal atrial fibrillation. She noted increased DOE recently and racing heart beat sensation walking from the parking lot to the store. She was scheduled for the appointment to further evaluate symptoms. Following this call, she presented to urgent care 03/23/21 with complaints of DOE and was found to be back in atrial fibrillation at that time. Compliance with eliquis was encouraged, as well as following up with cardiology as scheduled.  She presents today for follow-up of her recurrent atrial fibrillation. She reports starting last Thursday, 03/21/21 she noticed shortness of breath after doing her usual walk at [facility] which was out of the ordinary. She reported having similar symptoms with her last episode of atrial  fibrillation 10/2019. She has not noticed significant palpitations with this episode but persistent DOE and mild fatigue. She has not had any significant chest pain, orthopnea, PND LE edema, dizziness, lightheadedness, or syncope. She reports occasional twinges in her chest which last for seconds and resolve spontaneously - these are not frequent occurrences. She has never had an ischemic evaluation in the past. She reports missing 2-3 doses of eliquis in the past month but does not recall when her last missed dose is.     Past Medical History:  Diagnosis Date   Anxiety    Arthritis    "left knee; left hip; lower back; mild to moderate in right hip" (09/30/2016)   Atrial fibrillation (HCC)    Chronic lower back pain    Gastritis    Heart palpitations    History of bone density study 2018   History of mammogram 2021   History of MRI 2018   History of Papanicolaou smear of cervix    Hypercholesteremia    Osteopenia    Seasonal allergies     Past Surgical History:  Procedure Laterality Date   CARDIOVERSION N/A 11/17/2019   Procedure: CARDIOVERSION;  Surgeon: [name], MD;  Location: [place];  Service: Cardiovascular;  Laterality: N/A;   COLONOSCOPY     DILATION AND CURETTAGE OF UTERUS     JOINT REPLACEMENT     TONSILLECTOMY     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   Procedure: TOTAL HIP ARTHROPLASTY ANTERIOR APPROACH;  Surgeon: Dorna Leitz, MD;  Location: [place];  Service: Orthopedics;  Laterality: Left;     Current Outpatient Medications  Medication Sig Dispense Refill   Acetaminophen (TYLENOL PO) Take 1 tablet by mouth as needed.     amoxicillin (AMOXIL) 500 MG capsule 2,000 mg as directed. Prior to dental procedures     conjugated estrogens (PREMARIN) vaginal cream Place 1 Applicatorful vaginally once a week.     ELIQUIS 5 MG TABS tablet TAKE 1 TABLET(5 MG) BY MOUTH TWICE DAILY 180 tablet 1   Fish Oil-Cholecalciferol (OMEGA-3 FISH OIL-VITAMIN D3 PO) Take 3 capsules  by mouth daily.     loratadine (CLARITIN) 10 MG tablet Take 10 mg by mouth daily as needed for allergies.     MELATONIN PO Take 3 mg by mouth daily.     metoprolol tartrate (LOPRESSOR) 50 MG tablet Take 1 tablet (50 mg total) by mouth 2 (two) times daily. 1 tablet 0   NON FORMULARY Take 2 tablets by mouth daily. BONE-UP     Simethicone (GAS-X PO) Take 1 capsule by mouth as needed.     No current facility-administered medications for this visit.    Allergies:   Nsaids and Prednisone    Social History:  The patient  reports that she has quit smoking. Her smoking use included cigarettes. She smoked an average of 2 packs per day. She has never used smokeless tobacco. She reports current alcohol use. She reports that she does not use drugs.   Family History:  The patient's family history includes Anxiety disorder in her daughter; Cancer in her father; Diabetes in her sister; Hypertension in her mother.    ROS:  Please see the history of present illness.   Otherwise, review of systems are positive for none.   All other systems are reviewed and negative.    PHYSICAL EXAM: VS:  BP 118/78   Pulse 65   Ht '5\' 7"'$  (1.702 m)   Wt 134 lb (60.8 kg)   SpO2 98%   BMI 20.99 kg/m  , BMI Body mass index is 20.99 kg/m. GEN: Well nourished, well developed, in no acute distress HEENT: sclera anicteric Neck: no JVD, carotid bruits, or masses Cardiac: IRIR; no murmurs, rubs, or gallops,no edema  Respiratory:  clear to auscultation bilaterally, normal work of breathing GI: soft, nontender, nondistended, + BS MS: no deformity or atrophy Skin: warm and dry, no rash Neuro:  Strength and sensation are intact Psych: euthymic mood, full affect   EKG:  EKG is ordered today. The ekg ordered today demonstrates atrial fibrillation, rate 65 bpm, no STE/D   Recent Labs: 12/03/2020: ALT 21; BUN 19; Creat 0.73; Hemoglobin 13.7; Platelets 168; Potassium 4.2; Sodium 141; TSH 2.90    Lipid Panel    Component  Value Date/Time   CHOL 189 12/03/2020 1153   TRIG 50 12/03/2020 1153   HDL 59 12/03/2020 1153   CHOLHDL 3.2 12/03/2020 1153   LDLCALC 116 (H) 12/03/2020 1153      Wt Readings from Last 3 Encounters:  03/26/21 134 lb (60.8 kg)  12/03/20 135 lb 3.2 oz (61.3 kg)  11/15/20 135 lb 9.6 oz (61.5 kg)      Other studies Reviewed: Additional studies/ records that were reviewed today include:   Echocardiogram 10/2019: 1. Left ventricular ejection fraction, by estimation, is 60 to 65%. The  left ventricle has normal function. The left ventricle has no regional  wall motion abnormalities. Left ventricular diastolic parameters are  indeterminate.   2. Right ventricular systolic function is normal. The right ventricular  size is normal. There is normal pulmonary artery systolic pressure.  3. Left atrial size was moderately dilated.   4. The mitral valve is normal in structure. Mild mitral valve  regurgitation. No evidence of mitral stenosis.   5. The aortic valve is normal in structure. Aortic valve regurgitation is  trivial. No aortic stenosis is present.   6. The inferior vena cava is normal in size with greater than 50%  respiratory variability, suggesting right atrial pressure of 3 mmHg.     ASSESSMENT AND PLAN:  1. Paroxysmal atrial fibrillation: s/p DCCV 10/2019. Now appears to be back in persistent atrial fibrillation after recent complaints of DOE. TSH 11/2020 was wnl. She has been off AV nodal blocking agents given baseline bradycardia. She is not feeling so poorly that a TEE/DCCV needs to be pursued.  - Will plan for DCCV  04/16/21 following 3 weeks on uninturrupted anticoagulation.  - Shared Decision Making/Informed Consent The risks (stroke, cardiac arrhythmias rarely resulting in the need for a temporary or permanent pacemaker, skin irritation or burns and complications associated with conscious sedation including aspiration, arrhythmia, respiratory failure and death), benefits  (restoration of normal sinus rhythm) and alternatives of a direct current cardioversion were explained in detail to Ms. Konicki and she agrees to proceed.  - Given recurrent atrial fibrillation, will plan to repeat a coronary CTA after DCCV to r/o ischemia - Continue eliquis for stroke ppx - importance of compliance stressed - Will send referral to EP for recurrent symptomatic Afib and consideration for antiarrhythmic medications vs ablation   2. HLD: LDL 116 11/2020  - Continue fish oil - Information provided to help lower cholesterol - Will determine need for more aggressive management pending coronary CTA results.    Current medicines are reviewed at length with the patient today.  The patient does not have concerns regarding medicines.  The following changes have been made:  As above  Labs/ tests ordered today include:   Orders Placed This Encounter  Procedures   CT CORONARY MORPH W/CTA COR W/SCORE W/CA W/CM &/OR WO/CM   Basic metabolic panel   CBC   Ambulatory referral to Cardiac Electrophysiology   EKG 12-Lead      Disposition:   FU with myself or Dr. Stanford Breed in 2 months  Signed, Abigail Butts, PA-C  03/26/2021 12:07 PM

## 2021-03-26 ENCOUNTER — Ambulatory Visit: Payer: PPO | Admitting: Medical

## 2021-03-26 ENCOUNTER — Encounter: Payer: Self-pay | Admitting: Medical

## 2021-03-26 ENCOUNTER — Telehealth: Payer: Self-pay | Admitting: Family

## 2021-03-26 ENCOUNTER — Other Ambulatory Visit: Payer: Self-pay

## 2021-03-26 VITALS — BP 118/78 | HR 65 | Ht 67.0 in | Wt 134.0 lb

## 2021-03-26 DIAGNOSIS — E7800 Pure hypercholesterolemia, unspecified: Secondary | ICD-10-CM

## 2021-03-26 DIAGNOSIS — I480 Paroxysmal atrial fibrillation: Secondary | ICD-10-CM

## 2021-03-26 DIAGNOSIS — R0600 Dyspnea, unspecified: Secondary | ICD-10-CM | POA: Diagnosis not present

## 2021-03-26 DIAGNOSIS — R0609 Other forms of dyspnea: Secondary | ICD-10-CM

## 2021-03-26 MED ORDER — METOPROLOL TARTRATE 50 MG PO TABS: 50.0000 mg | ORAL_TABLET | Freq: Two times a day (BID) | ORAL | 0 refills | Status: DC

## 2021-03-26 NOTE — Patient Instructions (Addendum)
Medication Instructions:  Metoprolol 50 mg ( As Directed prior to CT) *If you need a refill on your cardiac medications before your next appointment, please call your pharmacy*   Lab Work: BMET, CBC If you have labs (blood work) drawn today and your tests are completely normal, you will receive your results only by: [place] (if you have MyChart) OR A paper copy in the mail If you have any lab test that is abnormal or we need to change your treatment, we will call you to review the results.   Testing/Procedures: [facility], [address]. Your physician has requested that you have cardiac CT. Cardiac computed tomography (CT) is a painless test that uses an x-ray machine to take clear, detailed pictures of your heart. For further information please visit [facility]. Please follow instruction sheet as given.    [facility], [address]. Your physician has recommended that you have a Cardioversion (DCCV). Electrical Cardioversion uses a jolt of electricity to your heart either through paddles or wired patches attached to your chest. This is a controlled, usually prescheduled, procedure. Defibrillation is done under light anesthesia in the hospital, and you usually go home the day of the procedure. This is done to get your heart back into a normal rhythm. You are not awake for the procedure. Please see the instruction sheet given to you today.   Follow-Up: At [facility], you and your health needs are our priority.  As part of our continuing mission to provide you with exceptional heart care, we have created designated Provider Care Teams.  These Care Teams include your primary Cardiologist (physician) and Advanced Practice Providers (APPs -  Physician Assistants and Nurse Practitioners) who all work together to provide you with the care you need, when you need it.  Your next appointment:   2 month(s)  The format for your next  appointment:   In Person  Provider:   Roby Lofts, PA-C  Electrical Cardioversion Electrical cardioversion is the delivery of a jolt of electricity to restore a normal rhythm to the heart. A rhythm that is too fast or is not regular keeps the heart from pumping well. In this procedure, sticky patches or metal paddlesare placed on the chest to deliver electricity to the heart from a device. This procedure may be done in an emergency if: There is low or no blood pressure as a result of the heart rhythm. Normal rhythm must be restored as fast as possible to protect the brain and heart from further damage. It may save a life. This may also be a scheduled procedure for irregular or fast heart rhythms thatare not immediately life-threatening. Tell a health care provider about: Any allergies you have. All medicines you are taking, including vitamins, herbs, eye drops, creams, and over-the-counter medicines. Any problems you or family members have had with anesthetic medicines. Any blood disorders you have. Any surgeries you have had. Any medical conditions you have. Whether you are pregnant or may be pregnant. What are the risks? Generally, this is a safe procedure. However, problems may occur, including: Allergic reactions to medicines. A blood clot that breaks free and travels to other parts of your body. The possible return of an abnormal heart rhythm within hours or days after the procedure. Your heart stopping (cardiac arrest). This is rare. What happens before the procedure? Medicines Your health care provider may have you start taking: Blood-thinning medicines (anticoagulants) so your blood does not clot as easily.  Medicines to help stabilize your heart rate and rhythm. Ask your health care provider about: Changing or stopping your regular medicines. This is especially important if you are taking diabetes medicines or blood thinners. Taking medicines such as aspirin and  ibuprofen. These medicines can thin your blood. Do not take these medicines unless your health care provider tells you to take them. Taking over-the-counter medicines, vitamins, herbs, and supplements. General instructions Follow instructions from your health care provider about eating or drinking restrictions. Plan to have someone take you home from the hospital or clinic. If you will be going home right after the procedure, plan to have someone with you for 24 hours. Ask your health care provider what steps will be taken to help prevent infection. These may include washing your skin with a germ-killing soap. What happens during the procedure?  An IV will be inserted into one of your veins. Sticky patches (electrodes) or metal paddles may be placed on your chest. You will be given a medicine to help you relax (sedative). An electrical shock will be delivered. The procedure may vary among health care providers and hospitals. What can I expect after the procedure? Your blood pressure, heart rate, breathing rate, and blood oxygen level will be monitored until you leave the hospital or clinic. Your heart rhythm will be watched to make sure it does not change. You may have some redness on the skin where the shocks were given. Follow these instructions at home: Do not drive for 24 hours if you were given a sedative during your procedure. Take over-the-counter and prescription medicines only as told by your health care provider. Ask your health care provider how to check your pulse. Check it often. Rest for 48 hours after the procedure or as told by your health care provider. Avoid or limit your caffeine use as told by your health care provider. Keep all follow-up visits as told by your health care provider. This is important. Contact a health care provider if: You feel like your heart is beating too quickly or your pulse is not regular. You have a serious muscle cramp that does not go  away. Get help right away if: You have discomfort in your chest. You are dizzy or you feel faint. You have trouble breathing or you are short of breath. Your speech is slurred. You have trouble moving an arm or leg on one side of your body. Your fingers or toes turn cold or blue. Summary Electrical cardioversion is the delivery of a jolt of electricity to restore a normal rhythm to the heart. This procedure may be done right away in an emergency or may be a scheduled procedure if the condition is not an emergency. Generally, this is a safe procedure. After the procedure, check your pulse often as told by your health care provider. This information is not intended to replace advice given to you by your health care provider. Make sure you discuss any questions you have with your healthcare provider. Document Revised: 02/14/2019 Document Reviewed: 02/14/2019 Elsevier Patient Education  [place] Many factors influence your heart (coronary) health, including eating and exercise habits. Coronary risk increases with abnormal blood fat (lipid) levels. Heart-healthy meal planning includes limiting unhealthy fats,increasing healthy fats, and making other diet and lifestyle changes. What is my plan? Your health care provider may recommend that you: Limit your fat intake to _________% or less of your total calories each day. Limit your saturated fat intake to _________% or  less of your total calories each day. Limit the amount of cholesterol in your diet to less than _________ mg per day. What are tips for following this plan? Cooking Cook foods using methods other than frying. Baking, boiling, grilling, and broiling are all good options. Other ways to reduce fat include: Removing the skin from poultry. Removing all visible fats from meats. Steaming vegetables in water or broth. Meal planning  At meals, imagine dividing your plate into fourths: Fill one-half  of your plate with vegetables and green salads. Fill one-fourth of your plate with whole grains. Fill one-fourth of your plate with lean protein foods. Eat 4-5 servings of vegetables per day. One serving equals 1 cup raw or cooked vegetable, or 2 cups raw leafy greens. Eat 4-5 servings of fruit per day. One serving equals 1 medium whole fruit,  cup dried fruit,  cup fresh, frozen, or canned fruit, or  cup 100% fruit juice. Eat more foods that contain soluble fiber. Examples include apples, broccoli, carrots, beans, peas, and barley. Aim to get 25-30 g of fiber per day. Increase your consumption of legumes, nuts, and seeds to 4-5 servings per week. One serving of dried beans or legumes equals  cup cooked, 1 serving of nuts is  cup, and 1 serving of seeds equals 1 tablespoon.  Fats Choose healthy fats more often. Choose monounsaturated and polyunsaturated fats, such as olive and canola oils, flaxseeds, walnuts, almonds, and seeds. Eat more omega-3 fats. Choose salmon, mackerel, sardines, tuna, flaxseed oil, and ground flaxseeds. Aim to eat fish at least 2 times each week. Check food labels carefully to identify foods with trans fats or high amounts of saturated fat. Limit saturated fats. These are found in animal products, such as meats, butter, and cream. Plant sources of saturated fats include palm oil, palm kernel oil, and coconut oil. Avoid foods with partially hydrogenated oils in them. These contain trans fats. Examples are stick margarine, some tub margarines, cookies, crackers, and other baked goods. Avoid fried foods. General information Eat more home-cooked food and less restaurant, buffet, and fast food. Limit or avoid alcohol. Limit foods that are high in starch and sugar. Lose weight if you are overweight. Losing just 5-10% of your body weight can help your overall health and prevent diseases such as diabetes and heart disease. Monitor your salt (sodium) intake, especially if you  have high blood pressure. Talk with your health care provider about your sodium intake. Try to incorporate more vegetarian meals weekly. What foods can I eat? Fruits All fresh, canned (in natural juice), or frozen fruits. Vegetables Fresh or frozen vegetables (raw, steamed, roasted, or grilled). Green salads. Grains Most grains. Choose whole wheat and whole grains most of the time. Rice andpasta, including brown rice and pastas made with whole wheat. Meats and other proteins Lean, well-trimmed beef, veal, pork, and lamb. Chicken and [place] without skin. All fish and shellfish. Wild duck, rabbit, pheasant, and venison. Egg whites or low-cholesterol egg substitutes. Dried beans, peas, lentils, and tofu. Seedsand most nuts. Dairy Low-fat or nonfat cheeses, including ricotta and mozzarella. Skim or 1% milk (liquid, powdered, or evaporated). Buttermilk made with low-fat milk. Nonfat orlow-fat yogurt. Fats and oils Non-hydrogenated (trans-free) margarines. Vegetable oils, including soybean, sesame, sunflower, olive, peanut, safflower, corn, canola, and cottonseed. Salad dressings or mayonnaisemade with a vegetable oil. Beverages Water (mineral or sparkling). Coffee and tea. Diet carbonated beverages. Sweets and desserts Sherbet, gelatin, and fruit ice. Small amounts of dark chocolate. Limit all sweets and  desserts. Seasonings and condiments All seasonings and condiments. The items listed above may not be a complete list of foods and beverages you can eat. Contact a dietitian for more options. What foods are not recommended? Fruits Canned fruit in heavy syrup. Fruit in cream or butter sauce. Fried fruit. Limitcoconut. Vegetables Vegetables cooked in cheese, cream, or butter sauce. Fried vegetables. Grains Breads made with saturated or trans fats, oils, or whole milk. Croissants. Sweet rolls. Donuts. High-fat crackers,such as cheese crackers. Meats and other proteins Fatty meats, such as hot  dogs, ribs, sausage, bacon, rib-eye roast or steak. High-fat deli meats, such as salami and bologna. Caviar. Domestic duck andgoose. Organ meats, such as liver. Dairy Cream, sour cream, cream cheese, and creamed cottage cheese. Whole milk cheeses. Whole or 2% milk (liquid, evaporated, or condensed). Whole buttermilk.Cream sauce or high-fat cheese sauce. Whole-milk yogurt. Fats and oils Meat fat, or shortening. Cocoa butter, hydrogenated oils, palm oil, coconut oil, palm kernel oil. Solid fats and shortenings, including bacon fat, salt pork, lard, and butter. Nondairy cream substitutes. Salad dressings with cheeseor sour cream. Beverages Regular sodas and any drinks with added sugar. Sweets and desserts Frosting. Pudding. Cookies. Cakes. Pies. Milk chocolate or white chocolate.Buttered syrups. Full-fat ice cream or ice cream drinks. The items listed above may not be a complete list of foods and beverages to avoid. Contact a dietitian for more information. Summary Heart-healthy meal planning includes limiting unhealthy fats, increasing healthy fats, and making other diet and lifestyle changes. Lose weight if you are overweight. Losing just 5-10% of your body weight can help your overall health and prevent diseases such as diabetes and heart disease. Focus on eating a balance of foods, including fruits and vegetables, low-fat or nonfat dairy, lean protein, nuts and legumes, whole grains, and heart-healthy oils and fats. This information is not intended to replace advice given to you by your health care provider. Make sure you discuss any questions you have with your healthcare provider. Document Revised: 08/21/2017 Document Reviewed: 08/21/2017 Elsevier Patient Education  2022 [employer].

## 2021-03-26 NOTE — Telephone Encounter (Signed)
I left the patient a vm to call us back and schedule an awv

## 2021-03-27 LAB — BASIC METABOLIC PANEL
BUN/Creatinine Ratio: 32 — ABNORMAL HIGH (ref 12–28)
BUN: 23 mg/dL (ref 8–27)
CO2: 27 mmol/L (ref 20–29)
Calcium: 9.5 mg/dL (ref 8.7–10.3)
Chloride: 102 mmol/L (ref 96–106)
Creatinine, Ser: 0.73 mg/dL (ref 0.57–1.00)
Glucose: 67 mg/dL (ref 65–99)
Potassium: 4.5 mmol/L (ref 3.5–5.2)
Sodium: 141 mmol/L (ref 134–144)
eGFR: 84 mL/min/{1.73_m2} (ref >59)

## 2021-03-27 LAB — CBC
Hematocrit: 42.7 % (ref 34.0–46.6)
Hemoglobin: 15 g/dL (ref 11.1–15.9)
MCH: 33.9 pg — ABNORMAL HIGH (ref 26.6–33.0)
MCHC: 35.1 g/dL (ref 31.5–35.7)
MCV: 96 fL (ref 79–97)
Platelets: 157 10*3/uL (ref 150–450)
RBC: 4.43 x10E6/uL (ref 3.77–5.28)
RDW: 11.5 % — ABNORMAL LOW (ref 11.7–15.4)
WBC: 5.6 10*3/uL (ref 3.4–10.8)

## 2021-03-28 DIAGNOSIS — Z1231 Encounter for screening mammogram for malignant neoplasm of breast: Secondary | ICD-10-CM | POA: Diagnosis not present

## 2021-04-03 ENCOUNTER — Ambulatory Visit (HOSPITAL_COMMUNITY): Admission: RE | Admit: 2021-04-03 | Payer: PPO | Source: Ambulatory Visit

## 2021-04-04 DIAGNOSIS — D2371 Other benign neoplasm of skin of right lower limb, including hip: Secondary | ICD-10-CM | POA: Diagnosis not present

## 2021-04-04 DIAGNOSIS — D225 Melanocytic nevi of trunk: Secondary | ICD-10-CM | POA: Diagnosis not present

## 2021-04-04 DIAGNOSIS — L578 Other skin changes due to chronic exposure to nonionizing radiation: Secondary | ICD-10-CM | POA: Diagnosis not present

## 2021-04-04 DIAGNOSIS — D2372 Other benign neoplasm of skin of left lower limb, including hip: Secondary | ICD-10-CM | POA: Diagnosis not present

## 2021-04-04 DIAGNOSIS — L821 Other seborrheic keratosis: Secondary | ICD-10-CM | POA: Diagnosis not present

## 2021-04-16 ENCOUNTER — Ambulatory Visit (HOSPITAL_COMMUNITY): Payer: PPO | Admitting: Certified Registered Nurse Anesthetist

## 2021-04-16 ENCOUNTER — Other Ambulatory Visit: Payer: Self-pay

## 2021-04-16 ENCOUNTER — Encounter (HOSPITAL_COMMUNITY): Payer: Self-pay | Admitting: Cardiovascular Disease

## 2021-04-16 ENCOUNTER — Telehealth: Payer: Self-pay | Admitting: Cardiology

## 2021-04-16 ENCOUNTER — Encounter (HOSPITAL_COMMUNITY)
Admission: RE | Disposition: A | Discharge status: Home / Self Care | Payer: Self-pay | Source: Home / Self Care | Attending: Cardiovascular Disease

## 2021-04-16 ENCOUNTER — Ambulatory Visit (HOSPITAL_COMMUNITY)
Admission: RE | Admit: 2021-04-16 | Discharge: 2021-04-16 | Disposition: A | Discharge status: Home / Self Care | Payer: PPO | Attending: Cardiovascular Disease | Admitting: Cardiovascular Disease

## 2021-04-16 DIAGNOSIS — Z79899 Other long term (current) drug therapy: Secondary | ICD-10-CM | POA: Diagnosis not present

## 2021-04-16 DIAGNOSIS — Z7901 Long term (current) use of anticoagulants: Secondary | ICD-10-CM | POA: Diagnosis not present

## 2021-04-16 DIAGNOSIS — Z888 Allergy status to other drugs, medicaments and biological substances status: Secondary | ICD-10-CM | POA: Diagnosis not present

## 2021-04-16 DIAGNOSIS — Z8249 Family history of ischemic heart disease and other diseases of the circulatory system: Secondary | ICD-10-CM | POA: Insufficient documentation

## 2021-04-16 DIAGNOSIS — E7800 Pure hypercholesterolemia, unspecified: Secondary | ICD-10-CM | POA: Diagnosis not present

## 2021-04-16 DIAGNOSIS — J302 Other seasonal allergic rhinitis: Secondary | ICD-10-CM | POA: Diagnosis not present

## 2021-04-16 DIAGNOSIS — I480 Paroxysmal atrial fibrillation: Secondary | ICD-10-CM | POA: Insufficient documentation

## 2021-04-16 DIAGNOSIS — I4819 Other persistent atrial fibrillation: Secondary | ICD-10-CM

## 2021-04-16 DIAGNOSIS — Z96642 Presence of left artificial hip joint: Secondary | ICD-10-CM | POA: Diagnosis not present

## 2021-04-16 DIAGNOSIS — I4891 Unspecified atrial fibrillation: Secondary | ICD-10-CM | POA: Diagnosis not present

## 2021-04-16 DIAGNOSIS — F419 Anxiety disorder, unspecified: Secondary | ICD-10-CM | POA: Diagnosis not present

## 2021-04-16 DIAGNOSIS — Z886 Allergy status to analgesic agent status: Secondary | ICD-10-CM | POA: Diagnosis not present

## 2021-04-16 DIAGNOSIS — Z87891 Personal history of nicotine dependence: Secondary | ICD-10-CM | POA: Diagnosis not present

## 2021-04-16 HISTORY — PX: CARDIOVERSION: SHX1299

## 2021-04-16 SURGERY — CARDIOVERSION
Anesthesia: General

## 2021-04-16 MED ORDER — LIDOCAINE 2% (20 MG/ML) 5 ML SYRINGE
INTRAMUSCULAR | Status: DC | PRN
  Administered 2021-04-16: 100 mg via INTRAVENOUS

## 2021-04-16 MED ORDER — PROPOFOL 10 MG/ML IV BOLUS
INTRAVENOUS | Status: DC | PRN
  Administered 2021-04-16: 50 mg via INTRAVENOUS

## 2021-04-16 MED ORDER — SODIUM CHLORIDE 0.9 % IV SOLN: INTRAVENOUS | Status: DC | PRN

## 2021-04-16 NOTE — Anesthesia Preprocedure Evaluation (Signed)
Anesthesia Evaluation  Patient identified by MRN, date of birth, ID band Patient awake    Reviewed: Allergy & Precautions, NPO status , Patient's Chart, lab work & pertinent test results  Airway Mallampati: I  TM Distance: >3 FB Neck ROM: Full    Dental  (+) Teeth Intact, Dental Advisory Given   Pulmonary former smoker,    breath sounds clear to auscultation       Cardiovascular negative cardio ROS   Rhythm:Irregular Rate:Normal     Neuro/Psych Anxiety  Neuromuscular disease    GI/Hepatic negative GI ROS, Neg liver ROS,   Endo/Other  negative endocrine ROS  Renal/GU negative Renal ROS     Musculoskeletal  (+) Arthritis , Osteoarthritis,    Abdominal Normal abdominal exam  (+)   Peds  Hematology negative hematology ROS (+)   Anesthesia Other Findings   Reproductive/Obstetrics                             Echo:  1. Left ventricular ejection fraction, by estimation, is 60 to 65%. The  left ventricle has normal function. The left ventricle has no regional  wall motion abnormalities. Left ventricular diastolic parameters are  indeterminate.  2. Right ventricular systolic function is normal. The right ventricular  size is normal. There is normal pulmonary artery systolic pressure.  3. Left atrial size was moderately dilated.  4. The mitral valve is normal in structure. Mild mitral valve  regurgitation. No evidence of mitral stenosis.  5. The aortic valve is normal in structure. Aortic valve regurgitation is  trivial. No aortic stenosis is present.  6. The inferior vena cava is normal in size with greater than 50%  respiratory variability, suggesting right atrial pressure of 3 mmHg.   Anesthesia Physical  Anesthesia Plan  ASA: 3  Anesthesia Plan: General   Post-op Pain Management:    Induction: Intravenous  PONV Risk Score and Plan: 3 and Treatment may vary due to age or  medical condition  Airway Management Planned: Natural Airway, Simple Face Mask and Nasal Cannula  Additional Equipment: None  Intra-op Plan:   Post-operative Plan:   Informed Consent:   Plan Discussed with: CRNA and Anesthesiologist  Anesthesia Plan Comments:         Anesthesia Quick Evaluation

## 2021-04-16 NOTE — Anesthesia Postprocedure Evaluation (Signed)
Anesthesia Post Note  Patient: Shelia [name]  Procedure(s) Performed: CARDIOVERSION     Patient location during evaluation: PACU Anesthesia Type: General Level of consciousness: awake and alert Pain management: pain level controlled Vital Signs Assessment: post-procedure vital signs reviewed and stable Respiratory status: spontaneous breathing, nonlabored ventilation, respiratory function stable and patient connected to nasal cannula oxygen Cardiovascular status: blood pressure returned to baseline and stable Postop Assessment: no apparent nausea or vomiting Anesthetic complications: no   No notable events documented.  Last Vitals:  Vitals:   04/16/21 0951 04/16/21 0955  BP: 100/62 (!) 98/59  Pulse: (!) 55 (!) 54  Resp: 13 19  Temp: (!) 36.4 C   SpO2: 100% 98%    Last Pain:  Vitals:   04/16/21 0951  TempSrc: Axillary  PainSc:                  Padraig Nhan

## 2021-04-16 NOTE — Telephone Encounter (Signed)
Pt c/o medication issue:  1. Name of Medication: metoprolol tartrate (LOPRESSOR) 50 MG tablet  2. How are you currently taking this medication (dosage and times per day)? Not taking  3. Are you having a reaction (difficulty breathing--STAT)? no  4. What is your medication issue? Patient states she has to take a 1 time tablet for the CT, but it was not sent to the pharmacy. She states she thinks it was mistakenly added as a medication she takes regularly, but she does not take it.

## 2021-04-16 NOTE — Interval H&P Note (Signed)
History and Physical Interval Note:  04/16/2021 9:41 AM  Shelia [name]  has presented today for surgery, with the diagnosis of AFIB.  The various methods of treatment have been discussed with the patient and family. After consideration of risks, benefits and other options for treatment, the patient has consented to  Procedure(s): CARDIOVERSION (N/A) as a surgical intervention.  The patient's history has been reviewed, patient examined, no change in status, stable for surgery.  I have reviewed the patient's chart and labs.  Questions were answered to the patient's satisfaction.     Skeet Latch, MD

## 2021-04-16 NOTE — Transfer of Care (Addendum)
Immediate Anesthesia Transfer of Care Note  Patient: Shelia [name]  Procedure(s) Performed: CARDIOVERSION  Patient Location: Endoscopy Unit  Anesthesia Type:General  Level of Consciousness: drowsy and patient cooperative  Airway & Oxygen Therapy: Patient Spontanous Breathing  Post-op Assessment: Report given to RN and Post -op Vital signs reviewed and stable  Post vital signs: Reviewed and stable  Last Vitals:  Vitals Value Taken Time  BP 100/62   Temp    Pulse 55   Resp 10   SpO2 99     Last Pain:  Vitals:   04/16/21 0857  TempSrc: Temporal  PainSc: 0-No pain         Complications: No notable events documented.

## 2021-04-16 NOTE — CV Procedure (Signed)
Electrical Cardioversion Procedure Note KAMBER VIGNOLA [id number] 11-25-1940  Procedure: Electrical Cardioversion Indications:  Atrial Fibrillation  Procedure Details Consent: Risks of procedure as well as the alternatives and risks of each were explained to the (patient/caregiver).  Consent for procedure obtained. Time Out: Verified patient identification, verified procedure, site/side was marked, verified correct patient position, special equipment/implants available, medications/allergies/relevent history reviewed, required imaging and test results available.  Performed  Patient placed on cardiac monitor, pulse oximetry, supplemental oxygen as necessary.  Sedation given:  propofol Pacer pads placed anterior and posterior chest.  Cardioverted 1 time(s).  Cardioverted at 150J.  Evaluation Findings: Post procedure EKG shows:  sinus bradycardia Complications: None Patient did tolerate procedure well.   Skeet Latch, MD 04/16/2021, 9:44 AM

## 2021-04-16 NOTE — Telephone Encounter (Signed)
Attempted to contact pt. Will forward to PA and MA for clarification. Pt sent a prescription for Metoprolol 50 mg BID. Per last office note, pt was no longer taking beta blocker.    Shelia [name] note from 03/26/21  1. Paroxysmal atrial fibrillation: s/p DCCV 10/2019. Now appears to be back in persistent atrial fibrillation after recent complaints of DOE. TSH 11/2020 was wnl. She has been off AV nodal blocking agents given baseline bradycardia. She is not feeling so poorly that a TEE/DCCV needs to be pursued.  - Will plan for DCCV  04/16/21 following 3 weeks on uninturrupted anticoagulation.

## 2021-04-16 NOTE — Discharge Instructions (Signed)
Electrical Cardioversion  Electrical cardioversion is the delivery of a jolt of electricity to restore a normal rhythm to the heart. A rhythm that is too fast or is not regular keeps the heart from pumping well. In this procedure, sticky patches or metal paddles are placed on the chest to deliver electricity to the heart from a device.  What can I expect after the procedure?  Your blood pressure, heart rate, breathing rate, and blood oxygen level will be monitored until you leave the hospital or clinic.  Your heart rhythm will be watched to make sure it does not change.  You may have some redness on the skin where the shocks were given.If this occurs, can use hydrocortisone cream or Aloe vera.  Follow these instructions at home:  Do not drive for 24 hours if you were given a sedative during your procedure.  Take over-the-counter and prescription medicines only as told by your health care provider.  Ask your health care provider how to check your pulse. Check it often.  Rest for 48 hours after the procedure or as told by your health care provider.  Avoid or limit your caffeine use as told by your health care provider.  Keep all follow-up visits as told by your health care provider. This is important.  Contact a health care provider if:  You feel like your heart is beating too quickly or your pulse is not regular.  You have a serious muscle cramp that does not go away.  Get help right away if:  You have discomfort in your chest.  You are dizzy or you feel faint.  You have trouble breathing or you are short of breath.  Your speech is slurred.  You have trouble moving an arm or leg on one side of your body.  Your fingers or toes turn cold or blue.  Summary  Electrical cardioversion is the delivery of a jolt of electricity to restore a normal rhythm to the heart.  This procedure may be done right away in an emergency or may be a scheduled procedure if the condition is not  an emergency.  Generally, this is a safe procedure.  After the procedure, check your pulse often as told by your health care provider.  This information is not intended to replace advice given to you by your health care provider. Make sure you discuss any questions you have with your health care provider. Document Revised: 02/14/2019 Document Reviewed: 02/14/2019 Elsevier Patient Education  2021 Elsevier Inc.  

## 2021-04-17 ENCOUNTER — Telehealth (HOSPITAL_COMMUNITY): Payer: Self-pay | Admitting: *Deleted

## 2021-04-17 ENCOUNTER — Telehealth: Payer: Self-pay | Admitting: Cardiology

## 2021-04-17 NOTE — Telephone Encounter (Signed)
Patient called in to say that she feels a flutter in her chest after the procedure she had on yesterday. Stated that her pulse seems normal not sure to why she feels that way. Please advise

## 2021-04-17 NOTE — Telephone Encounter (Signed)
Spoke with pt and provided recommendation from [place]. Pt state she spoke to radiologist and was informed they could provide her IV medication if needed. Pt state she would prefer not to take one time dose due to HR being 54 yesterday.   Will forward to PA to make aware.   Kroeger, Lorelee Cover., PA-C  You 3 hours ago (7:54 AM)   [facility] [place] - I think she is absolutely correct. The intended prescription was for pre-CT as she stated. Her HR was 56 bpm following her cardioversion. Please update prescription to indicate a one time 50mg  dose prior to her CT scan if her HR is greater than 60 2 hours prior to her CT scan, otherwise she does not need to take the metoprolol. Thank you!

## 2021-04-17 NOTE — Telephone Encounter (Signed)
Reaching out to patient to offer assistance regarding upcoming cardiac imaging study; pt verbalizes understanding of appt date/time, parking situation and where to check in, pre-test NPO status, and verified current allergies; name and call back number provided for further questions should they arise  Ulani Degrasse RN Navigator Cardiac Imaging [facility] [phone] office [phone] cell  

## 2021-04-17 NOTE — Telephone Encounter (Signed)
Patient made aware  Kroeger, Lorelee Cover., PA-C  You; Jacqulynn Cadet, CMA 8 minutes ago (4:16 PM)   Agree with proceeding with coronary CT as planned if she's feeling up to it. If she has ongoing concerns about her heart rhythm, would recommend obtaining the [employer] mobile app/device for home monitoring which may give her peace of mind. Certainly if she begins to feel poorly or palpitations persist, may consider bringing her in for an earlier visit. Thanks for your help!

## 2021-04-17 NOTE — Telephone Encounter (Signed)
Spoke to the patient. She stated that she had a successful cardioversion yesterday. This morning she stated that she had a fluttering feeling and wondered if she was back in atrial fib. Her daughter checked her pulse and stated that it felt normal. Pulse was 55.  She currently denies any symptoms and stated that it could be anxiety. She wanted to know if she should proceed with the coronary ct tomorrow. She has been advised that she can but if she feels bad later today to call back.   She would also like to have [employer] PA, opinion. Message has routed.

## 2021-04-18 ENCOUNTER — Other Ambulatory Visit: Payer: Self-pay

## 2021-04-18 ENCOUNTER — Ambulatory Visit (HOSPITAL_COMMUNITY)
Admission: RE | Admit: 2021-04-18 | Discharge: 2021-04-18 | Disposition: A | Discharge status: Home / Self Care | Payer: PPO | Source: Ambulatory Visit | Attending: Medical | Admitting: Medical

## 2021-04-18 ENCOUNTER — Encounter (HOSPITAL_COMMUNITY): Payer: Self-pay

## 2021-04-18 ENCOUNTER — Other Ambulatory Visit: Payer: Self-pay | Admitting: Cardiology

## 2021-04-18 ENCOUNTER — Ambulatory Visit (HOSPITAL_COMMUNITY)
Admission: RE | Admit: 2021-04-18 | Discharge: 2021-04-18 | Disposition: A | Discharge status: Home / Self Care | Payer: PPO | Source: Ambulatory Visit | Attending: Cardiology | Admitting: Cardiology

## 2021-04-18 DIAGNOSIS — I480 Paroxysmal atrial fibrillation: Secondary | ICD-10-CM | POA: Insufficient documentation

## 2021-04-18 DIAGNOSIS — E7800 Pure hypercholesterolemia, unspecified: Secondary | ICD-10-CM

## 2021-04-18 DIAGNOSIS — R0609 Other forms of dyspnea: Secondary | ICD-10-CM

## 2021-04-18 DIAGNOSIS — R0789 Other chest pain: Secondary | ICD-10-CM

## 2021-04-18 DIAGNOSIS — R931 Abnormal findings on diagnostic imaging of heart and coronary circulation: Secondary | ICD-10-CM

## 2021-04-18 DIAGNOSIS — R0600 Dyspnea, unspecified: Secondary | ICD-10-CM | POA: Diagnosis not present

## 2021-04-18 DIAGNOSIS — I700 Atherosclerosis of aorta: Secondary | ICD-10-CM | POA: Diagnosis not present

## 2021-04-18 MED ORDER — NITROGLYCERIN 0.4 MG SL SUBL
SUBLINGUAL_TABLET | SUBLINGUAL | Status: AC
  Filled 2021-04-18: qty 2

## 2021-04-18 MED ORDER — NITROGLYCERIN 0.4 MG SL SUBL
0.8000 mg | SUBLINGUAL_TABLET | Freq: Once | SUBLINGUAL | Status: AC
  Administered 2021-04-18: 0.8 mg via SUBLINGUAL

## 2021-04-18 MED ORDER — IOHEXOL 350 MG/ML SOLN
95.0000 mL | Freq: Once | INTRAVENOUS | Status: AC | PRN
  Administered 2021-04-18: 95 mL via INTRAVENOUS

## 2021-04-19 DIAGNOSIS — I700 Atherosclerosis of aorta: Secondary | ICD-10-CM | POA: Diagnosis not present

## 2021-04-23 ENCOUNTER — Encounter: Payer: Self-pay | Admitting: Family

## 2021-04-23 ENCOUNTER — Other Ambulatory Visit: Payer: Self-pay

## 2021-04-23 ENCOUNTER — Ambulatory Visit (INDEPENDENT_AMBULATORY_CARE_PROVIDER_SITE_OTHER): Payer: PPO | Admitting: Family

## 2021-04-23 DIAGNOSIS — Z0000 Encounter for general adult medical examination without abnormal findings: Secondary | ICD-10-CM

## 2021-04-23 NOTE — Patient Instructions (Signed)
Shelia [name] , Thank you for taking time to come for your Medicare Wellness Visit. I appreciate your ongoing commitment to your health goals. Please review the following plan we discussed and let me know if I can assist you in the future.   Screening recommendations/referrals: Colonoscopy: N/A  Mammogram : Up to date  Bone Density: Up to date  Recommended yearly ophthalmology/optometry visit for glaucoma screening and checkup Recommended yearly dental visit for hygiene and checkup  Vaccinations: Influenza vaccine: Due please get flu shot at your pharmacy or come to our office  Pneumococcal vaccine: will verify with Pharmacy then up date record  Tdap vaccine : Up to date  Shingles vaccine : Up to date     Advanced directives: Yes   Conditions/risks identified: Advance age female > 49 yrs,Hx of smoking   Next appointment: 1 year    Preventive Care 2 Years and Older, Female Preventive care refers to lifestyle choices and visits with your health care provider that can promote health and wellness. What does preventive care include? A yearly physical exam. This is also called an annual well check. Dental exams once or twice a year. Routine eye exams. Ask your health care provider how often you should have your eyes checked. Personal lifestyle choices, including: Daily care of your teeth and gums. Regular physical activity. Eating a healthy diet. Avoiding tobacco and drug use. Limiting alcohol use. Practicing safe sex. Taking low-dose aspirin every day. Taking vitamin and mineral supplements as recommended by your health care provider. What happens during an annual well check? The services and screenings done by your health care provider during your annual well check will depend on your age, overall health, lifestyle risk factors, and family history of disease. Counseling  Your health care provider may ask you questions about your: Alcohol use. Tobacco use. Drug use. Emotional  well-being. Home and relationship well-being. Sexual activity. Eating habits. History of falls. Memory and ability to understand (cognition). Work and work Statistician. Reproductive health. Screening  You may have the following tests or measurements: Height, weight, and BMI. Blood pressure. Lipid and cholesterol levels. These may be checked every 5 years, or more frequently if you are over 65 years old. Skin check. Lung cancer screening. You may have this screening every year starting at age 76 if you have a 30-pack-year history of smoking and currently smoke or have quit within the past 15 years. Fecal occult blood test (FOBT) of the stool. You may have this test every year starting at age 71. Flexible sigmoidoscopy or colonoscopy. You may have a sigmoidoscopy every 5 years or a colonoscopy every 10 years starting at age 60. Hepatitis C blood test. Hepatitis B blood test. Sexually transmitted disease (STD) testing. Diabetes screening. This is done by checking your blood sugar (glucose) after you have not eaten for a while (fasting). You may have this done every 1-3 years. Bone density scan. This is done to screen for osteoporosis. You may have this done starting at age 24. Mammogram. This may be done every 1-2 years. Talk to your health care provider about how often you should have regular mammograms. Talk with your health care provider about your test results, treatment options, and if necessary, the need for more tests. Vaccines  Your health care provider may recommend certain vaccines, such as: Influenza vaccine. This is recommended every year. Tetanus, diphtheria, and acellular pertussis (Tdap, Td) vaccine. You may need a Td booster every 10 years. Zoster vaccine. You may need this after  age 6. Pneumococcal 13-valent conjugate (PCV13) vaccine. One dose is recommended after age 61. Pneumococcal polysaccharide (PPSV23) vaccine. One dose is recommended after age 39. Talk to your  health care provider about which screenings and vaccines you need and how often you need them. This information is not intended to replace advice given to you by your health care provider. Make sure you discuss any questions you have with your health care provider. Document Released: 08/10/2015 Document Revised: 04/02/2016 Document Reviewed: 05/15/2015 Elsevier Interactive Patient Education  2017 [place] Prevention in the Home Falls can cause injuries. They can happen to people of all ages. There are many things you can do to make your home safe and to help prevent falls. What can I do on the outside of my home? Regularly fix the edges of walkways and driveways and fix any cracks. Remove anything that might make you trip as you walk through a door, such as a raised step or threshold. Trim any bushes or trees on the path to your home. Use bright outdoor lighting. Clear any walking paths of anything that might make someone trip, such as rocks or tools. Regularly check to see if handrails are loose or broken. Make sure that both sides of any steps have handrails. Any raised decks and porches should have guardrails on the edges. Have any leaves, snow, or ice cleared regularly. Use sand or salt on walking paths during winter. Clean up any spills in your garage right away. This includes oil or grease spills. What can I do in the bathroom? Use night lights. Install grab bars by the toilet and in the tub and shower. Do not use towel bars as grab bars. Use non-skid mats or decals in the tub or shower. If you need to sit down in the shower, use a plastic, non-slip stool. Keep the floor dry. Clean up any water that spills on the floor as soon as it happens. Remove soap buildup in the tub or shower regularly. Attach bath mats securely with double-sided non-slip rug tape. Do not have throw rugs and other things on the floor that can make you trip. What can I do in the bedroom? Use night  lights. Make sure that you have a light by your bed that is easy to reach. Do not use any sheets or blankets that are too big for your bed. They should not hang down onto the floor. Have a firm chair that has side arms. You can use this for support while you get dressed. Do not have throw rugs and other things on the floor that can make you trip. What can I do in the kitchen? Clean up any spills right away. Avoid walking on wet floors. Keep items that you use a lot in easy-to-reach places. If you need to reach something above you, use a strong step stool that has a grab bar. Keep electrical cords out of the way. Do not use floor polish or wax that makes floors slippery. If you must use wax, use non-skid floor wax. Do not have throw rugs and other things on the floor that can make you trip. What can I do with my stairs? Do not leave any items on the stairs. Make sure that there are handrails on both sides of the stairs and use them. Fix handrails that are broken or loose. Make sure that handrails are as long as the stairways. Check any carpeting to make sure that it is firmly attached to the stairs.  Fix any carpet that is loose or worn. Avoid having throw rugs at the top or bottom of the stairs. If you do have throw rugs, attach them to the floor with carpet tape. Make sure that you have a light switch at the top of the stairs and the bottom of the stairs. If you do not have them, ask someone to add them for you. What else can I do to help prevent falls? Wear shoes that: Do not have high heels. Have rubber bottoms. Are comfortable and fit you well. Are closed at the toe. Do not wear sandals. If you use a stepladder: Make sure that it is fully opened. Do not climb a closed stepladder. Make sure that both sides of the stepladder are locked into place. Ask someone to hold it for you, if possible. Clearly mark and make sure that you can see: Any grab bars or handrails. First and last  steps. Where the edge of each step is. Use tools that help you move around (mobility aids) if they are needed. These include: Canes. Walkers. Scooters. Crutches. Turn on the lights when you go into a dark area. Replace any light bulbs as soon as they burn out. Set up your furniture so you have a clear path. Avoid moving your furniture around. If any of your floors are uneven, fix them. If there are any pets around you, be aware of where they are. Review your medicines with your doctor. Some medicines can make you feel dizzy. This can increase your chance of falling. Ask your doctor what other things that you can do to help prevent falls. This information is not intended to replace advice given to you by your health care provider. Make sure you discuss any questions you have with your health care provider. Document Released: 05/10/2009 Document Revised: 12/20/2015 Document Reviewed: 08/18/2014 Elsevier Interactive Patient Education  2017 [employer].

## 2021-04-23 NOTE — Progress Notes (Signed)
This service is provided via telemedicine  No vital signs collected/recorded due to the encounter was a telemedicine visit.   Location of patient (ex: home, work):  Home  Patient consents to a telephone visit:  Yes, see encounter dated 03/22/2021  Location of the provider (ex: office, home):  [facility]  Name of any referring provider:  N/A  Names of all persons participating in the telemedicine service and their role in the encounter:  Marlowe Sax, NP, Bryant Sink Jones,CMA,and patient  Time spent on call:  9 minutes with medical assistant    Subjective:   KALIS FRIESE is a 80 y.o. female who presents for Medicare Annual (Subsequent) preventive examination.  Review of Systems     Cardiac Risk Factors include: advanced age (>26men, >57 women);smoking/ tobacco exposure     Objective:    Today's Vitals   04/23/21 1018  PainSc: 3    There is no height or weight on file to calculate BMI.  Advanced Directives 04/23/2021 04/16/2021 12/03/2020 11/15/2020 01/03/2020 11/17/2019 11/05/2016  Does Patient Have a Medical Advance Directive? Yes Yes Yes Yes Yes Yes Yes  Type of Paramedic of [place];Living will Healthcare Power of [place];Living will [place];Living will [place];Living will  Does patient want to make changes to medical advance directive? No - Patient declined - No - Patient declined No - Patient declined No - Patient declined - -  Copy of [place] in Chart? Yes - validated most recent copy scanned in chart (See row information) Yes - validated most recent copy scanned in chart (See row information) Yes - validated most recent copy scanned in chart (See row information) Yes - validated most recent copy scanned in chart (See row information) Yes - validated most recent copy scanned in  chart (See row information) No - copy requested -    Current Medications (verified) Outpatient Encounter Medications as of 04/23/2021  Medication Sig   amoxicillin (AMOXIL) 500 MG capsule 2,000 mg as directed. Prior to dental procedures   conjugated estrogens (PREMARIN) vaginal cream Place 1 Applicatorful vaginally every Wednesday.   ELIQUIS 5 MG TABS tablet TAKE 1 TABLET(5 MG) BY MOUTH TWICE DAILY   loratadine (CLARITIN) 10 MG tablet Take 10 mg by mouth daily as needed for allergies.   melatonin 3 MG TABS tablet Take 3 mg by mouth at bedtime.   NON FORMULARY Take 2 tablets by mouth daily. BONE-UP   Omega-3 Fatty Acids (FISH OIL PO) Take 3 capsules by mouth daily.   Simethicone (GAS-X PO) Take 1 capsule by mouth as needed (gas).   Varenicline Tartrate (TYRVAYA) 0.03 MG/ACT SOLN Place 1 spray into both nostrils 2 (two) times daily.   No facility-administered encounter medications on file as of 04/23/2021.    Allergies (verified) Nsaids and Prednisone   History: Past Medical History:  Diagnosis Date   Anxiety    Arthritis    "left knee; left hip; lower back; mild to moderate in right hip" (09/30/2016)   Atrial fibrillation (HCC)    Chronic lower back pain    Gastritis    Heart palpitations    History of bone density study 2018   History of mammogram 2021   History of MRI 2018   History of Papanicolaou smear of cervix    Hypercholesteremia    Osteopenia    Seasonal allergies  Past Surgical History:  Procedure Laterality Date   CARDIOVERSION N/A 11/17/2019   Procedure: CARDIOVERSION;  Surgeon: [name], MD;  Location: [facility] ENDOSCOPY;  Service: Cardiovascular;  Laterality: N/A;   CARDIOVERSION N/A 04/16/2021   Procedure: CARDIOVERSION;  Surgeon: Skeet Latch, MD;  Location: [facility] ENDOSCOPY;  Service: Cardiovascular;  Laterality: N/A;   COLONOSCOPY     DILATION AND CURETTAGE OF UTERUS     JOINT REPLACEMENT     TONSILLECTOMY     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    Procedure: TOTAL HIP ARTHROPLASTY ANTERIOR APPROACH;  Surgeon: Dorna Leitz, MD;  Location: [place];  Service: Orthopedics;  Laterality: Left;   Family History  Problem Relation Age of Onset   Hypertension Mother    Cancer Father    Diabetes Sister    Anxiety disorder Daughter    Breast cancer Neg Hx    Social History   Socioeconomic History   Marital status: Married    Spouse name: Not on file   Number of children: Not on file   Years of education: Not on file   Highest education level: Not on file  Occupational History   Not on file  Tobacco Use   Smoking status: Former    Packs/day: 2.00    Types: Cigarettes   Smokeless tobacco: Never   Tobacco comments:    "quit smoking mid-late 1980s"  Vaping Use   Vaping Use: Never used  Substance and Sexual Activity   Alcohol use: Not Currently    Comment: 3 drinks weekly   Drug use: No   Sexual activity: Yes  Other Topics Concern   Not on file  Social History Narrative   Tobacco use, amount per day now: None.   Past tobacco use, amount per day: Maximum 2 packs    How many years did you use tobacco: Quit 1985   Alcohol use (drinks per week): 3   Diet: Vegetables, Chicken, Fish, Grains, and Fruit.   Do you drink/eat things with caffeine: Coffee   Marital status: Married                                  What year were you married? 1969   Do you live in a house, apartment, assisted living, condo, trailer, etc.? House.   Is it one or more stories? 2 stories.   How many persons live in your home? 2   Do you have pets in your home?( please list) No.   Highest Level of education completed? Masters in Adult Educations.   Current or past profession: Regulatory affairs officer    Do you exercise? Yes.                                  Type and how often? Floor excercises 5 times week. Walk 2 miles 6 times week.   Do you have a living will? Yes   Do you have a DNR form?  Yes                                 If not, do you want to discuss one?   Do  you have signed POA/HPOA forms?  Yes  If so, please bring to you appointment      Do you have any difficulty bathing or dressing yourself? No   Do you have any difficulty preparing food or eating? No   Do you have any difficulty managing your medications? No   Do you have any difficulty managing your finances? No   Do you have any difficulty affording your medications? No   Social Determinants of Radio broadcast assistant Strain: Not on file  Food Insecurity: Not on file  Transportation Needs: Not on file  Physical Activity: Not on file  Stress: Not on file  Social Connections: Not on file    Tobacco Counseling Counseling given: Not Answered Tobacco comments: "quit smoking mid-late 1980s"   Clinical Intake:  Pre-visit preparation completed: No  Pain : 0-10 Pain Score: 3  Pain Type: Chronic pain Pain Location: Back Pain Orientation: Lower Pain Radiating Towards: no Pain Descriptors / Indicators: Aching Pain Relieving Factors: Tylenol,Stretching exercise Effect of Pain on Daily Activities: no  Pain Relieving Factors: Tylenol,Stretching exercise  BMI - recorded: 20.99 Nutritional Status: BMI of 19-24  Normal Nutritional Risks: None Diabetes: No  How often do you need to have someone help you when you read instructions, pamphlets, or other written materials from your doctor or pharmacy?: 1 - Never What is the last grade level you completed in school?: Master Degree  Diabetic?No   Interpreter Needed?: No  Information entered by :: Jeremaine Maraj,FNP-C   Activities of Daily Living In your present state of health, do you have any difficulty performing the following activities: 04/23/2021  Hearing? N  Vision? N  Difficulty concentrating or making decisions? Y  Comment remembering  Walking or climbing stairs? N  Dressing or bathing? N  Doing errands, shopping? N  Preparing Food and eating ? N  Using the Toilet? N  In the past six months,  have you accidently leaked urine? Y  Do you have problems with loss of bowel control? N  Managing your Medications? N  Managing your Finances? N  Housekeeping or managing your Housekeeping? N  Some recent data might be hidden    Patient Care Team: Delma Drone, Nelda Bucks, NP as PCP - General (Family Medicine) Stanford Breed Denice Bors, MD as PCP - Cardiology (Cardiology) Molli Posey, MD as Consulting Physician (Obstetrics and Gynecology) Stanford Breed Denice Bors, MD as Consulting Physician (Cardiology) Stephannie Li, OD (Ophthalmology)  Indicate any recent Medical Services you may have received from other than [facility] providers in the past year (date may be approximate).     Assessment:   This is a routine wellness examination for Willine.  Hearing/Vision screen Hearing Screening - Comments:: Patient has no hearing problems Vision Screening - Comments:: Patient wears glasses. Patient can't remember last eye exam. Patient sees Dr. Stephannie Li  Dietary issues and exercise activities discussed: Current Exercise Habits: Home exercise routine, Type of exercise: walking;stretching, Time (Minutes): 60, Frequency (Times/Week): 7, Weekly Exercise (Minutes/Week): 420, Intensity: Moderate, Exercise limited by: None identified   Goals Addressed             This Visit's Progress    Patient Stated       Reduce Alcohol intake        Depression Screen PHQ 2/9 Scores 04/23/2021  PHQ - 2 Score 0    Fall Risk Fall Risk  04/23/2021 12/03/2020 11/15/2020  Falls in the past year? 0 0 0  Number falls in past yr: 0 0 0  Injury with Fall? 0 0 0  Risk for fall due to : No Fall Risks - -  Follow up Falls evaluation completed - -    FALL RISK PREVENTION PERTAINING TO THE HOME:  Any stairs in or around the home? Yes  If so, are there any without handrails? No  Home free of loose throw rugs in walkways, pet beds, electrical cords, etc? Yes  Adequate lighting in your home to reduce risk of falls? Yes    ASSISTIVE DEVICES UTILIZED TO PREVENT FALLS:  Life alert? No  Use of a cane, walker or w/c? No  Grab bars in the bathroom? No  Shower chair or bench in shower? No  Elevated toilet seat or a handicapped toilet? No   TIMED UP AND GO:  Was the test performed? No .  Length of time to ambulate 10 feet: N/A  sec.   Gait steady and fast without use of assistive device  Cognitive Function: MMSE - Mini Mental State Exam 12/03/2020  Orientation to time 5  Orientation to Place 5  Registration 3  Attention/ Calculation 5  Recall 3  Language- name 2 objects 2  Language- repeat 1  Language- follow 3 step command 2  Language- read & follow direction 1  Write a sentence 1  Copy design 1  Total score 29     6CIT Screen 04/23/2021  What Year? 0 points  What month? 0 points  What time? 0 points  Count back from 20 0 points  Months in reverse 0 points  Repeat phrase 0 points  Total Score 0    Immunizations Immunization History  Administered Date(s) Administered   Influenza Split 04/30/2015, 04/20/2016, 04/27/2018, 06/16/2019   Influenza, High Dose Seasonal PF 06/28/2017, 06/16/2019, 06/02/2020   PFIZER(Purple Top)SARS-COV-2 Vaccination 08/12/2019, 09/02/2019, 05/01/2020, 11/13/2020, 04/12/2021   Pneumococcal Conjugate-13 09/30/2013   Pneumococcal Polysaccharide-23 07/27/2003, 09/24/2010   Td 04/26/2001, 12/25/2017   Tdap 09/24/2010   Zoster Recombinat (Shingrix) 11/16/2017, 01/06/2018   Zoster, Live 12/11/2006, 04/27/2018, 06/29/2018    TDAP status: Up to date  Flu Vaccine status: Due, Education has been provided regarding the importance of this vaccine. Advised may receive this vaccine at local pharmacy or Health Dept. Aware to provide a copy of the vaccination record if obtained from local pharmacy or Health Dept. Verbalized acceptance and understanding.  Pneumococcal vaccine status: Due, Education has been provided regarding the importance of this vaccine. Advised may  receive this vaccine at local pharmacy or Health Dept. Aware to provide a copy of the vaccination record if obtained from local pharmacy or Health Dept. Verbalized acceptance and understanding.  Covid-19 vaccine status: Completed vaccines  Qualifies for Shingles Vaccine? Yes   Zostavax completed Yes   Shingrix Completed?: Yes  Screening Tests Health Maintenance  Topic Date Due   INFLUENZA VACCINE  02/25/2021   TETANUS/TDAP  12/26/2027   DEXA SCAN  Completed   COVID-19 Vaccine  Completed   Hepatitis C Screening  Completed   Zoster Vaccines- Shingrix  Completed   HPV VACCINES  Aged Out    Health Maintenance  Health Maintenance Due  Topic Date Due   INFLUENZA VACCINE  02/25/2021    Colorectal cancer screening: No longer required.   Mammogram status: Completed 04/03/2021. Repeat every year  Bone Density status: Completed 11/30/2020. Results reflect: Bone density results: OSTEOPENIA. Repeat every 2 years.  Lung Cancer Screening: (Low Dose CT Chest recommended if Age 46-80 years, 30 pack-year currently smoking OR have quit w/in 15years.) does not qualify.   Lung Cancer Screening Referral:  No   Additional Screening:  Hepatitis C Screening: does not qualify; Completed yes   Vision Screening: Recommended annual ophthalmology exams for early detection of glaucoma and other disorders of the eye. Is the patient up to date with their annual eye exam?  Yes  Who is the provider or what is the name of the office in which the patient attends annual eye exams? Dr. Stephannie Li  If pt is not established with a provider, would they like to be referred to a provider to establish care? No .   Dental Screening: Recommended annual dental exams for proper oral hygiene  Community Resource Referral / Chronic Care Management: CRR required this visit?  No   CCM required this visit?  No      Plan:     I have personally reviewed and noted the following in the patient's chart:   Medical  and social history Use of alcohol, tobacco or illicit drugs  Current medications and supplements including opioid prescriptions.  Functional ability and status Nutritional status Physical activity Advanced directives List of other physicians Hospitalizations, surgeries, and ER visits in previous 12 months Vitals Screenings to include cognitive, depression, and falls Referrals and appointments  In addition, I have reviewed and discussed with patient certain preventive protocols, quality metrics, and best practice recommendations. A written personalized care plan for preventive services as well as general preventive health recommendations were provided to patient.     Sandrea Hughs, NP   04/23/2021   Nurse Notes:will get her Influenza vaccine at her pharmacy. Will verify with her pharmacy Pneumococcal vaccine not on chart.

## 2021-05-09 ENCOUNTER — Ambulatory Visit: Payer: PPO | Admitting: Cardiology

## 2021-05-09 ENCOUNTER — Encounter: Payer: Self-pay | Admitting: Cardiology

## 2021-05-09 ENCOUNTER — Other Ambulatory Visit: Payer: Self-pay

## 2021-05-09 VITALS — BP 88/58 | HR 49 | Ht 67.0 in | Wt 131.8 lb

## 2021-05-09 DIAGNOSIS — I480 Paroxysmal atrial fibrillation: Secondary | ICD-10-CM

## 2021-05-09 NOTE — Progress Notes (Signed)
Electrophysiology Office Note   Date:  05/09/2021   ID:  Shelia [name], DOB [birth year], MRN [id number]  PCP:  Sandrea Hughs, NP  Cardiologist:  Whitney Post Primary Electrophysiologist:  Zeinab Rodwell Meredith Leeds, MD    Chief Complaint: AF   History of Present Illness: Shelia [name] is a 80 y.o. female who is being seen today for the evaluation of AF at the request of Kroeger, Krista M., PA-C. Presenting today for electrophysiology evaluation.  She has a history significant for atrial fibrillation and hyperlipidemia.  She has had atrial fibrillation for the last few years.  She had a cardioversion April 2021.  She recently presented to cardiology clinic feeling more short of breath with palpitations.  Her episode of atrial fibrillation started 03/21/2021.  She is status post cardioversion 04/16/2021.  She noted shortness of breath when walking [place] which was not usual for her.  Today, she denies symptoms of palpitations, chest pain, shortness of breath, orthopnea, PND, lower extremity edema, claudication, dizziness, presyncope, syncope, bleeding, or neurologic sequela. The patient is tolerating medications without difficulties.  When she is in atrial fibrillation, she has shortness of breath and fatigue.  She states that it is difficult to do all of her daily activities when she is in atrial fibrillation.  Immediately after cardioversion, she had palpitations.  Palpitations occurred a few times a week and lasted for up to 10 minutes at a time.  She had similar symptoms when she had these palpitations.  They have not occurred over the last week and a half.  She is felt well during this time is continued to exercise.   Past Medical History:  Diagnosis Date   Anxiety    Arthritis    "left knee; left hip; lower back; mild to moderate in right hip" (09/30/2016)   Atrial fibrillation (HCC)    Chronic lower back pain    Gastritis    Heart palpitations    History of bone  density study 2018   History of mammogram 2021   History of MRI 2018   History of Papanicolaou smear of cervix    Hypercholesteremia    Osteopenia    Seasonal allergies    Past Surgical History:  Procedure Laterality Date   CARDIOVERSION N/A 11/17/2019   Procedure: CARDIOVERSION;  Surgeon: [name], MD;  Location: [place];  Service: Cardiovascular;  Laterality: N/A;   CARDIOVERSION N/A 04/16/2021   Procedure: CARDIOVERSION;  Surgeon: Skeet Latch, MD;  Location: [place];  Service: Cardiovascular;  Laterality: N/A;   COLONOSCOPY     DILATION AND CURETTAGE OF UTERUS     JOINT REPLACEMENT     TONSILLECTOMY     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   Procedure: TOTAL HIP ARTHROPLASTY ANTERIOR APPROACH;  Surgeon: Dorna Leitz, MD;  Location: [place];  Service: Orthopedics;  Laterality: Left;     Current Outpatient Medications  Medication Sig Dispense Refill   amoxicillin (AMOXIL) 500 MG capsule 2,000 mg as directed. Prior to dental procedures     conjugated estrogens (PREMARIN) vaginal cream Place 1 Applicatorful vaginally every Wednesday.     ELIQUIS 5 MG TABS tablet TAKE 1 TABLET(5 MG) BY MOUTH TWICE DAILY 180 tablet 1   loratadine (CLARITIN) 10 MG tablet Take 10 mg by mouth daily as needed for allergies.     melatonin 3 MG TABS tablet Take 3 mg by mouth at bedtime.     NON FORMULARY Take 2 tablets by mouth daily. BONE-UP  Omega-3 Fatty Acids (FISH OIL PO) Take 3 capsules by mouth daily.     Simethicone (GAS-X PO) Take 1 capsule by mouth as needed (gas).     Varenicline Tartrate (TYRVAYA) 0.03 MG/ACT SOLN Place 1 spray into both nostrils 2 (two) times daily.     No current facility-administered medications for this visit.    Allergies:   Nsaids and Prednisone   Social History:  The patient  reports that she has quit smoking. Her smoking use included cigarettes. She smoked an average of 2 packs per day. She has never used smokeless tobacco. She reports that  she does not currently use alcohol. She reports that she does not use drugs.   Family History:  The patient's family history includes Anxiety disorder in her daughter; Cancer in her father; Diabetes in her sister; Hypertension in her mother.    ROS:  Please see the history of present illness.   Otherwise, review of systems is positive for none.   All other systems are reviewed and negative.    PHYSICAL EXAM: VS:  BP (!) 88/58   Pulse (!) 49   Ht 5\' 7"  (1.702 m)   Wt 131 lb 12.8 oz (59.8 kg)   SpO2 99%   BMI 20.64 kg/m  , BMI Body mass index is 20.64 kg/m. GEN: Well nourished, well developed, in no acute distress  HEENT: normal  Neck: no JVD, carotid bruits, or masses Cardiac: RRR; no murmurs, rubs, or gallops,no edema  Respiratory:  clear to auscultation bilaterally, normal work of breathing GI: soft, nontender, nondistended, + BS MS: no deformity or atrophy  Skin: warm and dry Neuro:  Strength and sensation are intact Psych: euthymic mood, full affect  EKG:  EKG is ordered today. Personal review of the ekg ordered shows sinus rhythm  Recent Labs: 12/03/2020: ALT 21; TSH 2.90 03/26/2021: BUN 23; Creatinine, Ser 0.73; Hemoglobin 15.0; Platelets 157; Potassium 4.5; Sodium 141    Lipid Panel     Component Value Date/Time   CHOL 189 12/03/2020 1153   TRIG 50 12/03/2020 1153   HDL 59 12/03/2020 1153   CHOLHDL 3.2 12/03/2020 1153   LDLCALC 116 (H) 12/03/2020 1153     Wt Readings from Last 3 Encounters:  05/09/21 131 lb 12.8 oz (59.8 kg)  04/16/21 134 lb (60.8 kg)  03/26/21 134 lb (60.8 kg)      Other studies Reviewed: Additional studies/ records that were reviewed today include: TTE 10/31/19  Review of the above records today demonstrates:   1. Left ventricular ejection fraction, by estimation, is 60 to 65%. The  left ventricle has normal function. The left ventricle has no regional  wall motion abnormalities. Left ventricular diastolic parameters are  indeterminate.    2. Right ventricular systolic function is normal. The right ventricular  size is normal. There is normal pulmonary artery systolic pressure.   3. Left atrial size was moderately dilated.   4. The mitral valve is normal in structure. Mild mitral valve  regurgitation. No evidence of mitral stenosis.   5. The aortic valve is normal in structure. Aortic valve regurgitation is  trivial. No aortic stenosis is present.   6. The inferior vena cava is normal in size with greater than 50%  respiratory variability, suggesting right atrial pressure of 3 mmHg.   Coronary CT 04/18/2021 1. Coronary calcium score of 192 (LAD). This was 71 percentile for age and sex matched control. 2. Normal coronary origin with Left dominance. 3. There is proximal LAD  calcified plaque with 25-49% stenosis. Sending for FFR analysis. 4.  Aortic atherosclerosis.  ASSESSMENT AND PLAN:  1.  Persistent atrial fibrillation: Currently on Eliquis 5 mg twice daily.  CHA2DS2-VASc of 3.  She is unfortunately had multiple episodes of atrial fibrillation each requiring cardioversion.  I do think she would benefit from rhythm control, though with her infrequent episodes of atrial fibrillation, could also continue to monitor.  We discussed ablation for her atrial fibrillation as well as medication management with flecainide.  She Jayleana Colberg discuss these options with her husband.  With her palpitations, she Gwyn Mehring likely get a cardia mobile for monitoring.  2.  Hyperlipidemia: Continue fish oil per primary cardiology.  Case discussed with primary cardiology  Current medicines are reviewed at length with the patient today.   The patient does not have concerns regarding her medicines.  The following changes were made today:  none  Labs/ tests ordered today include:  Orders Placed This Encounter  Procedures   EKG 12-Lead     Disposition:   FU with Hani Patnode 6 months  Signed, Vickii Volland Meredith Leeds, MD  05/09/2021 9:35 AM     [facility]  [facility] [address] [place] [address] [phone] (office) [phone] (fax)

## 2021-05-09 NOTE — Patient Instructions (Addendum)
Medication Instructions:  Your physician recommends that you continue on your current medications as directed. Please refer to the Current Medication list given to you today.  *If you need a refill on your cardiac medications before your next appointment, please call your pharmacy*   Lab Work: None ordered   Testing/Procedures: None ordered   Follow-Up: At [facility], you and your health needs are our priority.  As part of our continuing mission to provide you with exceptional heart care, we have created designated Provider Care Teams.  These Care Teams include your primary Cardiologist (physician) and Advanced Practice Providers (APPs -  Physician Assistants and Nurse Practitioners) who all work together to provide you with the care you need, when you need it.  We recommend signing up for the patient portal called "MyChart".  Sign up information is provided on this After Visit Summary.  MyChart is used to connect with patients for Virtual Visits (Telemedicine).  Patients are able to view lab/test results, encounter notes, upcoming appointments, etc.  Non-urgent messages can be sent to your provider as well.   To learn more about what you can do with MyChart, go to [email or web address].    Your next appointment:   6 month(s)  The format for your next appointment:   In Person  Provider:   Allegra Lai, MD    Thank you for choosing [facility]!!   Trinidad Curet, RN [phone]   Other Instructions  Flecainide Tablets What is this medication? FLECAINIDE (FLEK a nide) prevents and treats a fast or irregular heartbeat (arrhythmia). It is often used to treat a type of arrhythmia known as AFib (atrial fibrillation). It works by slowing down overactive electric signals in the heart, which stabilizes your heart rhythm. It belongs to a group of medications called antiarrhythmics. This medicine may be used for other purposes; ask your health care provider or pharmacist if  you have questions. COMMON BRAND NAME(S): Tambocor What should I tell my care team before I take this medication? They need to know if you have any of these conditions: Abnormal levels of potassium in the blood Heart disease including heart rhythm and heart rate problems Kidney or liver disease Recent heart attack An unusual or allergic reaction to flecainide, local anesthetics, other medications, foods, dyes, or preservatives Pregnant or trying to get pregnant Breast-feeding How should I use this medication? Take this medication by mouth with a glass of water. Follow the directions on the prescription label. You can take this medication with or without food. Take your doses at regular intervals. Do not take your medication more often than directed. Do not stop taking this medication suddenly. This may cause serious, heart-related side effects. If your care team wants you to stop the medication, the dose may be slowly lowered over time to avoid any side effects. Talk to your care team regarding the use of this medication in children. While this medication may be prescribed for children as young as 1 year of age for selected conditions, precautions do apply. Overdosage: If you think you have taken too much of this medicine contact a poison control center or emergency room at once. NOTE: This medicine is only for you. Do not share this medicine with others. What if I miss a dose? If you miss a dose, take it as soon as you can. If it is almost time for your next dose, take only that dose. Do not take double or extra doses. What may interact with this medication? Do not  take this medication with any of the following: Amoxapine Arsenic trioxide Certain antibiotics like clarithromycin, erythromycin, gatifloxacin, gemifloxacin, levofloxacin, moxifloxacin, sparfloxacin, or troleandomycin Certain antidepressants called tricyclic antidepressants like amitriptyline, imipramine, or nortriptyline Certain  medications to control heart rhythm like disopyramide, encainide, moricizine, procainamide, propafenone, and quinidine Cisapride Delavirdine Droperidol Haloperidol [place]orn Imatinib Levomethadyl Maprotiline Medications for malaria like chloroquine and halofantrine Pentamidine Phenothiazines like chlorpromazine, mesoridazine, prochlorperazine, thioridazine Pimozide Quinine Ranolazine Ritonavir Sertindole This medication may also interact with the following: Cimetidine Dofetilide Medications for angina or high blood pressure Medications to control heart rhythm like amiodarone and digoxin Ziprasidone This list may not describe all possible interactions. Give your health care provider a list of all the medicines, herbs, non-prescription drugs, or dietary supplements you use. Also tell them if you smoke, drink alcohol, or use illegal drugs. Some items may interact with your medicine. What should I watch for while using this medication? Visit your care team for regular checks on your progress. Because your condition and the use of this medication carries some risk, it is a good idea to carry an identification card, necklace or bracelet with details of your condition, medications, and care team. Check your blood pressure and pulse rate regularly. Ask your care team what your blood pressure and pulse rate should be, and when you should contact them. Your care team also may schedule regular blood tests and electrocardiograms to check your progress. You may get drowsy or dizzy. Do not drive, use machinery, or do anything that needs mental alertness until you know how this medication affects you. Do not stand or sit up quickly, especially if you are an older patient. This reduces the risk of dizzy or fainting spells. Alcohol can make you more dizzy, increase flushing and rapid heartbeats. Avoid alcoholic drinks. What side effects may I notice from receiving this medication? Side effects that you  should report to your care team as soon as possible: Allergic reactions-skin rash, itching, hives, swelling of the face, lips, tongue, or throat Heart failure-shortness of breath, swelling of the ankles, feet, or hands, sudden weight gain, unusual weakness or fatigue Heart rhythm changes-fast or irregular heartbeat, dizziness, feeling faint or lightheaded, chest pain, trouble breathing Liver injury-right upper belly pain, loss of appetite, nausea, light-colored stool, dark yellow or brown urine, yellowing skin or eyes, unusual weakness or fatigue Side effects that usually do not require medical attention (report to your care team if they continue or are bothersome): Blurry vision Constipation Dizziness Fatigue Headache Nausea Tremors or shaking This list may not describe all possible side effects. Call your doctor for medical advice about side effects. You may report side effects to FDA at 1-800-FDA-1088. Where should I keep my medication? Keep out of the reach of children and pets. Store at room temperature between 15 and 30 degrees C (59 and 86 degrees F). Protect from light. Keep container tightly closed. Throw away any unused medication after the expiration date. NOTE: This sheet is a summary. It may not cover all possible information. If you have questions about this medicine, talk to your doctor, pharmacist, or health care provider.  2022 Elsevier/Gold Standard (2020-08-16 12:17:39)    Cardiac Ablation Cardiac ablation is a procedure to destroy (ablate) some heart tissue that is sending bad signals. These bad signals cause problems in heart rhythm. The heart has many areas that make these signals. If there are problems in these areas, they can make the heart beat in a way that is not normal. Destroying some tissues  can help make the heart rhythm normal. Tell your doctor about: Any allergies you have. All medicines you are taking. These include vitamins, herbs, eye drops, creams, and  over-the-counter medicines. Any problems you or family members have had with medicines that make you fall asleep (anesthetics). Any blood disorders you have. Any surgeries you have had. Any medical conditions you have, such as kidney failure. Whether you are pregnant or may be pregnant. What are the risks? This is a safe procedure. But problems may occur, including: Infection. Bruising and bleeding. Bleeding into the chest. Stroke or blood clots. Damage to nearby areas of your body. Allergies to medicines or dyes. The need for a pacemaker if the normal system is damaged. Failure of the procedure to treat the problem. What happens before the procedure? Medicines Ask your doctor about: Changing or stopping your normal medicines. This is important. Taking aspirin and ibuprofen. Do not take these medicines unless your doctor tells you to take them. Taking other medicines, vitamins, herbs, and supplements. General instructions Follow instructions from your doctor about what you cannot eat or drink. Plan to have someone take you home from the hospital or clinic. If you will be going home right after the procedure, plan to have someone with you for 24 hours. Ask your doctor what steps will be taken to prevent infection. What happens during the procedure?  An IV tube will be put into one of your veins. You will be given a medicine to help you relax. The skin on your neck or groin will be numbed. A cut (incision) will be made in your neck or groin. A needle will be put through your cut and into a large vein. A tube (catheter) will be put into the needle. The tube will be moved to your heart. Dye may be put through the tube. This helps your doctor see your heart. Small devices (electrodes) on the tube will send out signals. A type of energy will be used to destroy some heart tissue. The tube will be taken out. Pressure will be held on your cut. This helps stop bleeding. A bandage will be  put over your cut. The exact procedure may vary among doctors and hospitals. What happens after the procedure? You will be watched until you leave the hospital or clinic. This includes checking your heart rate, breathing rate, oxygen, and blood pressure. Your cut will be watched for bleeding. You will need to lie still for a few hours. Do not drive for 24 hours or as long as your doctor tells you. Summary Cardiac ablation is a procedure to destroy some heart tissue. This is done to treat heart rhythm problems. Tell your doctor about any medical conditions you may have. Tell him or her about all medicines you are taking to treat them. This is a safe procedure. But problems may occur. These include infection, bruising, bleeding, and damage to nearby areas of your body. Follow what your doctor tells you about food and drink. You may also be told to change or stop some of your medicines. After the procedure, do not drive for 24 hours or as long as your doctor tells you. This information is not intended to replace advice given to you by your health care provider. Make sure you discuss any questions you have with your health care provider. Document Revised: 06/16/2019 Document Reviewed: 06/16/2019 Elsevier Patient Education  2022 [employer].

## 2021-05-13 ENCOUNTER — Encounter: Payer: Self-pay | Admitting: *Deleted

## 2021-05-16 ENCOUNTER — Telehealth: Payer: Self-pay | Admitting: *Deleted

## 2021-05-16 NOTE — Telephone Encounter (Signed)
-----   Message from [name], MD sent at 05/09/2021 11:15 AM EDT ----- Given mild coronary disease and elevated calcium score would begin Crestor 20 mg daily.  Check lipids and liver in 12 weeks. Kirk Ruths  ----- Message ----- From: Constance Haw, MD Sent: 05/09/2021   9:35 AM EDT To: [name], MD, Abigail Butts, PA-C  I saw Shelia [name] today for her atrial fibrillation.  She is currently feeling well, but has had some palpitations.  I talked her about ablation or medication management.  She will discuss this with her husband.  She is also concerned about her elevated calcium score.  I told her that she would be contacted about the possibility of starting a statin.  She otherwise feels well.  Thanks.

## 2021-05-16 NOTE — Telephone Encounter (Signed)
Spoke with pt, Aware of dr Jacalyn Lefevre recommendations.  She would like to work on diet and would like to see a nutritionist  She has a follow up appointment in November and will have lab work done at that time and will go from there.

## 2021-05-27 NOTE — Progress Notes (Signed)
Cardiology Office Note   Date:  05/28/2021   ID:  Shelia [name], DOB 25-Jul-1941, MRN [id number]  PCP:  Sandrea Hughs, NP  Cardiologist:  Kirk Ruths, MD EP: Will Meredith Leeds, MD  Chief Complaint  Patient presents with   Follow-up    Paroxysmal A. fib s/p DCCV      History of Present Illness: Shelia [name] is a 80 y.o. female  with a PMH of nonobstructive CAD, paroxysmal atrial fibrillation, and HLD, who presents for post cardioversion follow-up.  I saw Shelia [name] 03/26/2021 for recurrent atrial fibrillation.  She had noticed increased DOE and racing heartbeat sensation.  Just prior to this visit she was seen at an urgent care facility and found to be in recurrent atrial fibrillation.  Given persistent atrial fibrillation at follow-up she was recommended to undergo DCCV.  This occurred 04/16/2021 with successful restoration of sinus rhythm.  She underwent a coronary CTA 04/19/2021 to rule out ischemia which suggested 25-49% stenosis in her proximal LAD with negative FFR study, otherwise no significant disease.  She was seen by Dr. Curt Bears with the EP 05/09/2021 at which time she was felt to benefit from rhythm control given episodes of recurrent atrial fibrillation each requiring cardioversion.  Patient wished to discuss options further with her husband prior to making a decision.  Following her coronary CTA results she was recommended to start a statin given LDL of 115 however patient wished to trial diet/exercise and reassess at her upcoming visit in November.  She presents today for 74-month follow-up.  She has been doing fairly well over the past couple months.  She notes occasional episodes of palpitations which do not last more than 5 minutes but are not associated with shortness of breath as her previous episodes of A. fib have been.  She wishes to continue watchful waiting to determine ongoing frequency of A. fib prior to initiating antiarrhythmic medications.  We  reviewed her coronary CTA results and need for risk factor modifications going forward.  She wishes to have her cholesterol levels rechecked today prior to making decision on starting a statin.  She also wishes to have a referral to a nutritionist to help her navigate heart healthy diet and.  She has not had any complaints of chest pain, shortness of breath, DOE, dizziness, lightheadedness, syncope, lower extremity edema, orthopnea, PND.    Past Medical History:  Diagnosis Date   Anxiety    Arthritis    "left knee; left hip; lower back; mild to moderate in right hip" (09/30/2016)   Atrial fibrillation (HCC)    Chronic lower back pain    Gastritis    Heart palpitations    History of bone density study 2018   History of mammogram 2021   History of MRI 2018   History of Papanicolaou smear of cervix    Hypercholesteremia    Osteopenia    Seasonal allergies     Past Surgical History:  Procedure Laterality Date   CARDIOVERSION N/A 11/17/2019   Procedure: CARDIOVERSION;  Surgeon: [name], MD;  Location: [place];  Service: Cardiovascular;  Laterality: N/A;   CARDIOVERSION N/A 04/16/2021   Procedure: CARDIOVERSION;  Surgeon: Skeet Latch, MD;  Location: [place];  Service: Cardiovascular;  Laterality: N/A;   COLONOSCOPY     DILATION AND CURETTAGE OF UTERUS     JOINT REPLACEMENT     TONSILLECTOMY     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   Procedure: TOTAL HIP ARTHROPLASTY ANTERIOR  APPROACH;  Surgeon: Dorna Leitz, MD;  Location: [place];  Service: Orthopedics;  Laterality: Left;     Current Outpatient Medications  Medication Sig Dispense Refill   amoxicillin (AMOXIL) 500 MG capsule 2,000 mg as directed. Prior to dental procedures     conjugated estrogens (PREMARIN) vaginal cream Place 1 Applicatorful vaginally every Wednesday.     ELIQUIS 5 MG TABS tablet TAKE 1 TABLET(5 MG) BY MOUTH TWICE DAILY 180 tablet 1   loratadine (CLARITIN) 10 MG tablet Take 10 mg by mouth  daily as needed for allergies.     melatonin 3 MG TABS tablet Take 3 mg by mouth at bedtime.     NON FORMULARY Take 2 tablets by mouth daily. BONE-UP     Omega-3 Fatty Acids (FISH OIL PO) Take 3 capsules by mouth daily.     Simethicone (GAS-X PO) Take 1 capsule by mouth as needed (gas).     Varenicline Tartrate (TYRVAYA) 0.03 MG/ACT SOLN Place 1 spray into both nostrils 2 (two) times daily.     No current facility-administered medications for this visit.    Allergies:   Nsaids and Prednisone    Social History:  The patient  reports that she has quit smoking. Her smoking use included cigarettes. She smoked an average of 2 packs per day. She has never used smokeless tobacco. She reports that she does not currently use alcohol. She reports that she does not use drugs.   Family History:  The patient's family history includes Anxiety disorder in her daughter; Cancer in her father; Diabetes in her sister; Hypertension in her mother.    ROS:  Please see the history of present illness.   Otherwise, review of systems are positive for none.   All other systems are reviewed and negative.    PHYSICAL EXAM: VS:  BP 108/60 (BP Location: Left Arm, Patient Position: Sitting, Cuff Size: Normal)   Pulse (!) 48   Ht 5\' 7"  (1.702 m)   Wt 129 lb 12.8 oz (58.9 kg)   SpO2 100%   BMI 20.33 kg/m  , BMI Body mass index is 20.33 kg/m. GEN: Well nourished, well developed, in no acute distress HEENT: Sclera anicteric Neck: no JVD, carotid bruits, or masses Cardiac: Bradycardic, regular rhythm; no murmurs, rubs, or gallops, no edema  Respiratory:  clear to auscultation bilaterally, normal work of breathing GI: soft, nontender, nondistended, + BS MS: no deformity or atrophy Skin: warm and dry, no rash Neuro:  Strength and sensation are intact Psych: euthymic mood, full affect   EKG:  EKG is not ordered today.    Recent Labs: 12/03/2020: ALT 21; TSH 2.90 03/26/2021: BUN 23; Creatinine, Ser 0.73;  Hemoglobin 15.0; Platelets 157; Potassium 4.5; Sodium 141    Lipid Panel    Component Value Date/Time   CHOL 189 12/03/2020 1153   TRIG 50 12/03/2020 1153   HDL 59 12/03/2020 1153   CHOLHDL 3.2 12/03/2020 1153   LDLCALC 116 (H) 12/03/2020 1153      Wt Readings from Last 3 Encounters:  05/28/21 129 lb 12.8 oz (58.9 kg)  05/09/21 131 lb 12.8 oz (59.8 kg)  04/16/21 134 lb (60.8 kg)      Other studies Reviewed: Additional studies/ records that were reviewed today include:   Echocardiogram 10/2019: 1. Left ventricular ejection fraction, by estimation, is 60 to 65%. The  left ventricle has normal function. The left ventricle has no regional  wall motion abnormalities. Left ventricular diastolic parameters are  indeterminate.  2. Right ventricular systolic function is normal. The right ventricular  size is normal. There is normal pulmonary artery systolic pressure.   3. Left atrial size was moderately dilated.   4. The mitral valve is normal in structure. Mild mitral valve  regurgitation. No evidence of mitral stenosis.   5. The aortic valve is normal in structure. Aortic valve regurgitation is  trivial. No aortic stenosis is present.   6. The inferior vena cava is normal in size with greater than 50%  respiratory variability, suggesting right atrial pressure of 3 mmHg.   Coronary CTA 03/2021: IMPRESSION: 1. Coronary calcium score of 192 (LAD). This was 80 percentile for age and sex matched control.   2. Normal coronary origin with Left dominance.   3. There is proximal LAD calcified plaque with 25-49% stenosis. Sending for FFR analysis.   4.  Aortic atherosclerosis.  FFR:   IMPRESSION: 1.  CT FFR analysis didn't show any significant stenosis.    ASSESSMENT AND PLAN:  1.  Paroxysmal atrial fibrillation: HR 48 today with regular rhythm on exam.  She is not on any AV nodal blocking agents due to baseline bradycardia.  No complaints of bleeding on Eliquis -Continue  Eliquis for stroke Ppx -We again discussed the utility of the kardia mobile app -We will hold off on initiation of antiarrhythmics at this time  2.  Nonobstructive coronary artery disease: Noted to have 25-49% stenosis of proximal LAD coronary CTA 04/19/2021.  She has not had any anginal complaints.  Not on aspirin due to need for [facility] as above.  Patient declined statin previously -Continue aggressive risk factor modifications for goal BP <130/80, LDL <70, and A1c < 7  3.  HLD: LDL 116 11/2020.  Given coronary CTA results she was recommended to start statin though declined in favor of dietary modifications. -We will repeat FLP today-if LDL remains above 70, which I anticipate it will be, will recommend initiation of statin medication at that time -Can continue fish oil -If statin initiated, she will need repeat FLP/LFTs at follow-up visit as scheduled with Dr. Stanford Breed 08/13/2021  4. Bradycardia: HR 48 today.  This appears to be her baseline and is stable from her visit with Dr. Curt Bears 05/09/2021.  No symptoms to suggest symptomatic bradycardia, high-grade AV block, or pauses. -Continue to avoid AV nodal blocking agents  Will send referral to a nutritionist, at her request, to help her navigate a heart healthy diet   Current medicines are reviewed at length with the patient today.  The patient does not have concerns regarding medicines.  The following changes have been made: As above  Labs/ tests ordered today include:   Orders Placed This Encounter  Procedures   Lipid panel   Amb ref to Medical Nutrition Therapy-MNT     Disposition:   FU with Dr. Stanford Breed as scheduled in January 2023   Signed, Abigail Butts, PA-C  05/28/2021 9:46 AM

## 2021-05-28 ENCOUNTER — Ambulatory Visit: Payer: PPO | Admitting: Medical

## 2021-05-28 ENCOUNTER — Other Ambulatory Visit: Payer: Self-pay

## 2021-05-28 ENCOUNTER — Encounter: Payer: Self-pay | Admitting: Medical

## 2021-05-28 VITALS — BP 108/60 | HR 48 | Ht 67.0 in | Wt 129.8 lb

## 2021-05-28 DIAGNOSIS — I480 Paroxysmal atrial fibrillation: Secondary | ICD-10-CM | POA: Diagnosis not present

## 2021-05-28 DIAGNOSIS — I2510 Atherosclerotic heart disease of native coronary artery without angina pectoris: Secondary | ICD-10-CM

## 2021-05-28 DIAGNOSIS — Z713 Dietary counseling and surveillance: Secondary | ICD-10-CM

## 2021-05-28 DIAGNOSIS — E785 Hyperlipidemia, unspecified: Secondary | ICD-10-CM

## 2021-05-28 LAB — LIPID PANEL
Chol/HDL Ratio: 3.3 ratio (ref 0.0–4.4)
Cholesterol, Total: 192 mg/dL (ref 100–199)
HDL: 59 mg/dL (ref >39)
LDL Chol Calc (NIH): 124 mg/dL — ABNORMAL HIGH (ref 0–99)
Triglycerides: 47 mg/dL (ref 0–149)
VLDL Cholesterol Cal: 9 mg/dL (ref 5–40)

## 2021-05-28 NOTE — Patient Instructions (Addendum)
Medication Instructions:  Your physician recommends that you continue on your current medications as directed. Please refer to the Current Medication list given to you today.  *If you need a refill on your cardiac medications before your next appointment, please call your pharmacy*   Lab Work: Your physician recommends that you return for lab work in:  TODAY: Lipids If you have labs (blood work) drawn today and your tests are completely normal, you will receive your results only by: [place] (if you have [facility]) OR A paper copy in the mail If you have any lab test that is abnormal or we need to change your treatment, we will call you to review the results.   Testing/Procedures: None   Follow-Up: At [facility], you and your health needs are our priority.  As part of our continuing mission to provide you with exceptional heart care, we have created designated Provider Care Teams.  These Care Teams include your primary Cardiologist (physician) and Advanced Practice Providers (APPs -  Physician Assistants and Nurse Practitioners) who all work together to provide you with the care you need, when you need it.  We recommend signing up for the patient portal called "MyChart".  Sign up information is provided on this After Visit Summary.  MyChart is used to connect with patients for Virtual Visits (Telemedicine).  Patients are able to view lab/test results, encounter notes, upcoming appointments, etc.  Non-urgent messages can be sent to your provider as well.   To learn more about what you can do with MyChart, go to [email or web address].    Your next appointment:   Jan 17th at 9 am  The format for your next appointment:   In Person  Provider:   Kirk Ruths, MD   Other Instructions If cholesterol medication id prescribed please obtain Co-Q10 over the counter.    Fat and Cholesterol Restricted Eating Plan Getting too much fat and cholesterol in your diet may cause health  problems. Choosing the right foods helps keep your fat and cholesterol at normal levels. This can keep you from getting certain diseases.  What are tips for following this plan? Meal planning At meals, divide your plate into four equal parts: Fill one-half of your plate with vegetables and green salads. Fill one-fourth of your plate with whole grains. Fill one-fourth of your plate with low-fat (lean) protein foods. Eat fish that is high in omega-3 fats at least two times a week. This includes mackerel, tuna, sardines, and salmon. Eat foods that are high in fiber, such as whole grains, beans, apples, broccoli, carrots, peas, and barley. General tips  Work with your doctor to lose weight if you need to. Avoid: Foods with added sugar. Fried foods. Foods with partially hydrogenated oils. Limit alcohol intake to no more than 1 drink a day for nonpregnant women and 2 drinks a day for men. One drink equals 12 oz of beer, 5 oz of wine, or 1 oz of hard liquor. Reading food labels Check food labels for: Trans fats. Partially hydrogenated oils. Saturated fat (g) in each serving. Cholesterol (mg) in each serving. Fiber (g) in each serving. Choose foods with healthy fats, such as: Monounsaturated fats. Polyunsaturated fats. Omega-3 fats. Choose grain products that have whole grains. Look for the word "whole" as the first word in the ingredient list. Cooking Cook foods using low-fat methods. These include baking, boiling, grilling, and broiling. Eat more home-cooked foods. Eat at restaurants and buffets less often. Avoid cooking using saturated fats, such as  butter, cream, palm oil, palm kernel oil, and coconut oil. Recommended foods Fruits All fresh, canned (in natural juice), or frozen fruits. Vegetables Fresh or frozen vegetables (raw, steamed, roasted, or grilled). Green salads. Grains Whole grains, such as whole wheat or whole grain breads, crackers, cereals, and pasta. Unsweetened  oatmeal, bulgur, barley, quinoa, or brown rice. Corn or whole wheat flour tortillas. Meats and other protein foods Ground beef (85% or leaner), grass-fed beef, or beef trimmed of fat. Skinless chicken or [place]. Ground chicken or [place]. Pork trimmed of fat. All fish and seafood. Egg whites. Dried beans, peas, or lentils. Unsalted nuts or seeds. Unsalted canned beans. Nut butters without added sugar or oil. Dairy Low-fat or nonfat dairy products, such as skim or 1% milk, 2% or reduced-fat cheeses, low-fat and fat-free ricotta or cottage cheese, or plain low-fat and nonfat yogurt. Fats and oils Tub margarine without trans fats. Light or reduced-fat mayonnaise and salad dressings. Avocado. Olive, canola, sesame, or safflower oils. The items listed above may not be a complete list of foods and beverages you can eat. Contact a dietitian for more information. Foods to avoid Fruits Canned fruit in heavy syrup. Fruit in cream or butter sauce. Fried fruit. Vegetables Vegetables cooked in cheese, cream, or butter sauce. Fried vegetables. Grains White bread. White pasta. White rice. Cornbread. Bagels, pastries, and croissants. Crackers and snack foods that contain trans fat and hydrogenated oils. Meats and other protein foods Fatty cuts of meat. Ribs, chicken wings, bacon, sausage, bologna, salami, chitterlings, fatback, hot dogs, bratwurst, and packaged lunch meats. Liver and organ meats. Whole eggs and egg yolks. Chicken and [place] with skin. Fried meat. Dairy Whole or 2% milk, cream, half-and-half, and cream cheese. Whole milk cheeses. Whole-fat or sweetened yogurt. Full-fat cheeses. Nondairy creamers and whipped toppings. Processed cheese, cheese spreads, and cheese curds. Beverages Alcohol. Sugar-sweetened drinks such as sodas, lemonade, and fruit drinks. Fats and oils Butter, stick margarine, lard, shortening, ghee, or bacon fat. Coconut, palm kernel, and palm oils. Sweets and desserts Corn  syrup, sugars, honey, and molasses. Candy. Jam and jelly. Syrup. Sweetened cereals. Cookies, pies, cakes, donuts, muffins, and ice cream. The items listed above may not be a complete list of foods and beverages you should avoid. Contact a dietitian for more information. Summary Choosing the right foods helps keep your fat and cholesterol at normal levels. This can keep you from getting certain diseases. At meals, fill one-half of your plate with vegetables and green salads. Eat high-fiber foods, like whole grains, beans, apples, carrots, peas, and barley. Limit added sugar, saturated fats, alcohol, and fried foods. This information is not intended to replace advice given to you by your health care provider. Make sure you discuss any questions you have with your health care provider. Document Revised: 11/16/2019 Document Reviewed: 11/16/2019 Elsevier Patient Education  2022 [place] Heart-healthy meal planning includes: Eating less unhealthy fats. Eating more healthy fats. Making other changes in your diet. Talk with your doctor or a diet specialist (dietitian) to create an eating plan that is right for you. What are tips for following this plan? Cooking Avoid frying your food. Try to bake, boil, grill, or broil it instead. You can also reduce fat by: Removing the skin from poultry. Removing all visible fats from meats. Steaming vegetables in water or broth. Meal planning  At meals, divide your plate into four equal parts: Fill one-half of your plate with vegetables and green salads. Fill one-fourth of  your plate with whole grains. Fill one-fourth of your plate with lean protein foods. Eat 4-5 servings of vegetables per day. A serving of vegetables is: 1 cup of raw or cooked vegetables. 2 cups of raw leafy greens. Eat 4-5 servings of fruit per day. A serving of fruit is: 1 medium whole fruit.  cup of dried fruit.  cup of fresh, frozen, or canned  fruit.  cup of 100% fruit juice. Eat more foods that have soluble fiber. These are apples, broccoli, carrots, beans, peas, and barley. Try to get 20-30 g of fiber per day. Eat 4-5 servings of nuts, legumes, and seeds per week: 1 serving of dried beans or legumes equals  cup after being cooked. 1 serving of nuts is  cup. 1 serving of seeds equals 1 tablespoon. General information Eat more home-cooked food. Eat less restaurant, buffet, and fast food. Limit or avoid alcohol. Limit foods that are high in starch and sugar. Avoid fried foods. Lose weight if you are overweight. Keep track of how much salt (sodium) you eat. This is important if you have high blood pressure. Ask your doctor to tell you more about this. Try to add vegetarian meals each week. Fats Choose healthy fats. These include olive oil and canola oil, flaxseeds, walnuts, almonds, and seeds. Eat more omega-3 fats. These include salmon, mackerel, sardines, tuna, flaxseed oil, and ground flaxseeds. Try to eat fish at least 2 times each week. Check food labels. Avoid foods with trans fats or high amounts of saturated fat. Limit saturated fats. These are often found in animal products, such as meats, butter, and cream. These are also found in plant foods, such as palm oil, palm kernel oil, and coconut oil. Avoid foods with partially hydrogenated oils in them. These have trans fats. Examples are stick margarine, some tub margarines, cookies, crackers, and other baked goods. What foods can I eat? Fruits All fresh, canned (in natural juice), or frozen fruits. Vegetables Fresh or frozen vegetables (raw, steamed, roasted, or grilled). Green salads. Grains Most grains. Choose whole wheat and whole grains most of the time. Rice and pasta, including brown rice and pastas made with whole wheat. Meats and other proteins Lean, well-trimmed beef, veal, pork, and lamb. Chicken and [place] without skin. All fish and shellfish. Wild duck,  rabbit, pheasant, and venison. Egg whites or low-cholesterol egg substitutes. Dried beans, peas, lentils, and tofu. Seeds and most nuts. Dairy Low-fat or nonfat cheeses, including ricotta and mozzarella. Skim or 1% milk that is liquid, powdered, or evaporated. Buttermilk that is made with low-fat milk. Nonfat or low-fat yogurt. Fats and oils Non-hydrogenated (trans-free) margarines. Vegetable oils, including soybean, sesame, sunflower, olive, peanut, safflower, corn, canola, and cottonseed. Salad dressings or mayonnaise made with a vegetable oil. Beverages Mineral water. Coffee and tea. Diet carbonated beverages. Sweets and desserts Sherbet, gelatin, and fruit ice. Small amounts of dark chocolate. Limit all sweets and desserts. Seasonings and condiments All seasonings and condiments. The items listed above may not be a complete list of foods and drinks you can eat. Contact a dietitian for more options. What foods should I avoid? Fruits Canned fruit in heavy syrup. Fruit in cream or butter sauce. Fried fruit. Limit coconut. Vegetables Vegetables cooked in cheese, cream, or butter sauce. Fried vegetables. Grains Breads that are made with saturated or trans fats, oils, or whole milk. Croissants. Sweet rolls. Donuts. High-fat crackers, such as cheese crackers. Meats and other proteins Fatty meats, such as hot dogs, ribs, sausage, bacon, rib-eye roast  or steak. High-fat deli meats, such as salami and bologna. Caviar. Domestic duck and goose. Organ meats, such as liver. Dairy Cream, sour cream, cream cheese, and creamed cottage cheese. Whole-milk cheeses. Whole or 2% milk that is liquid, evaporated, or condensed. Whole buttermilk. Cream sauce or high-fat cheese sauce. Yogurt that is made from whole milk. Fats and oils Meat fat, or shortening. Cocoa butter, hydrogenated oils, palm oil, coconut oil, palm kernel oil. Solid fats and shortenings, including bacon fat, salt pork, lard, and butter.  Nondairy cream substitutes. Salad dressings with cheese or sour cream. Beverages Regular sodas and juice drinks with added sugar. Sweets and desserts Frosting. Pudding. Cookies. Cakes. Pies. Milk chocolate or white chocolate. Buttered syrups. Full-fat ice cream or ice cream drinks. The items listed above may not be a complete list of foods and drinks to avoid. Contact a dietitian for more information. Summary Heart-healthy meal planning includes eating less unhealthy fats, eating more healthy fats, and making other changes in your diet. Eat a balanced diet. This includes fruits and vegetables, low-fat or nonfat dairy, lean protein, nuts and legumes, whole grains, and heart-healthy oils and fats. This information is not intended to replace advice given to you by your health care provider. Make sure you discuss any questions you have with your health care provider. Document Revised: 11/22/2020 Document Reviewed: 11/22/2020 Elsevier Patient Education  2022 [employer].

## 2021-05-30 ENCOUNTER — Other Ambulatory Visit: Payer: Self-pay

## 2021-05-30 DIAGNOSIS — E785 Hyperlipidemia, unspecified: Secondary | ICD-10-CM

## 2021-05-30 MED ORDER — ROSUVASTATIN CALCIUM 20 MG PO TABS
20.0000 mg | ORAL_TABLET | Freq: Every day | ORAL | 1 refills | Status: DC
Start: 2021-05-30 — End: 2021-11-15
  Filled 2021-08-28: qty 90, 90d supply, fill #0

## 2021-06-02 ENCOUNTER — Other Ambulatory Visit: Payer: Self-pay | Admitting: Cardiology

## 2021-06-03 NOTE — Telephone Encounter (Signed)
Prescription refill request for Eliquis received. Indication:Afib Last office visit:11/22 Scr:0.7 Age: 80 Weight:58.9 kg  Prescription refilled

## 2021-06-18 DIAGNOSIS — H6123 Impacted cerumen, bilateral: Secondary | ICD-10-CM | POA: Diagnosis not present

## 2021-07-12 ENCOUNTER — Other Ambulatory Visit: Payer: Self-pay

## 2021-07-12 DIAGNOSIS — E785 Hyperlipidemia, unspecified: Secondary | ICD-10-CM

## 2021-07-24 ENCOUNTER — Telehealth: Payer: Self-pay

## 2021-07-24 ENCOUNTER — Encounter (HOSPITAL_COMMUNITY): Payer: Self-pay | Admitting: *Deleted

## 2021-07-24 ENCOUNTER — Other Ambulatory Visit: Payer: Self-pay

## 2021-07-24 ENCOUNTER — Ambulatory Visit (HOSPITAL_COMMUNITY)
Admission: EM | Admit: 2021-07-24 | Discharge: 2021-07-24 | Disposition: A | Discharge status: Home / Self Care | Payer: PPO | Attending: Internal Medicine | Admitting: Internal Medicine

## 2021-07-24 DIAGNOSIS — I480 Paroxysmal atrial fibrillation: Secondary | ICD-10-CM

## 2021-07-24 NOTE — ED Triage Notes (Signed)
Pt reports this morning her heart felt irregular.

## 2021-07-24 NOTE — Discharge Instructions (Addendum)
Your symptoms today are likely due to your heart going back into Atrial Fibrillation. Your Blood pressure is perfect and your heart rate is 68. You are already on an anticoagulant. Please call your cardiologist in the morning to discuss if other options (flecainide, ablation, xio patch) for management of your A.fib. Avoid any stimulants such as alcohol and caffeine.  Increase your HDL with monounsaturated fats.

## 2021-07-24 NOTE — Telephone Encounter (Signed)
Pt came into office for lab draw and had tech call triage because she "has some concerns". Spoke with pt and she thinks that she is "out of rhythm" and would like some direction. She states that she has been cardioverted 03-23-21 and went back into rhythm after. Now she is back out of rhythm. Informed pt to go to the ER since we have no openings. She states that she does not want to go to the ER and will go to the urgent care and see what they say. Informed pt that she should go to ER since this is cardiac. She repeated that she will go to urgent care.

## 2021-07-24 NOTE — ED Provider Notes (Signed)
[facility]    CSN: [id number] Arrival date & time: 07/24/21  1506      History   Chief Complaint Chief Complaint  Patient presents with   Irregular Heart Beat    HPI Shelia [name] is a 80 y.o. female.   Pleasant 80 year old female with a known history of PAF presents today with concerns of an irregular heart beating starting abruptly at 10 AM this morning.  She states she was outside talking with a neighbor in the cold when she started to feel a weird "twinge "in her chest.  She went inside thinking it was just related to the cold, but states she started to feel her heart " do something weird."  She was walking up the stairs and felt a mild shortness of breath.  She states here irregular heartbeat sensation lasted roughly 1 hour this morning.  She denies taking any medications for this and states it stopped spontaneously.  She had a cardioversion in September and states she has been in normal sinus rhythm per her recollection since the procedure.  She checked her pulse rate at home this morning and states the highest was 94.  Rate in our office today is 71-68.  Patient takes her anticoagulation daily as prescribed.  Her only other prescription is for Crestor 20.  She recently had a full catheterization which revealed 25 to 49% stenosis.  Most recent LDL was 124.  She denies any active chest pain.  She states that the irregular heartbeat sensation he has she had this morning is no longer present.  She admits her follow-up with her cardiologist is in several weeks, and had previously discussed trying flecainide.  She denies any additional concerns in office today.    Past Medical History:  Diagnosis Date   Anxiety    Arthritis    "left knee; left hip; lower back; mild to moderate in right hip" (09/30/2016)   Atrial fibrillation (HCC)    Chronic lower back pain    Gastritis    Heart palpitations    History of bone density study 2018   History of mammogram 2021    History of MRI 2018   History of Papanicolaou smear of cervix    Hypercholesteremia    Osteopenia    Seasonal allergies     Patient Active Problem List   Diagnosis Date Noted   Atrial fibrillation ([place])    Primary osteoarthritis of left hip 09/29/2016   Urinary incontinence 06/25/2016   Lumbar radiculopathy 06/18/2016   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   Abnormality of gait 02/02/2013   Nonallopathic lesion of lumbar region 04/14/2011   OSTEOARTHRITIS, HIP, LEFT 11/14/2009   METATARSALGIA 11/14/2009   BUNION, RIGHT FOOT 11/14/2009   HIP PAIN, LEFT 10/02/2009   Pain in joint, lower leg 10/02/2009   LOW BACK PAIN, CHRONIC 10/02/2009    Past Surgical History:  Procedure Laterality Date   CARDIOVERSION N/A 11/17/2019   Procedure: CARDIOVERSION;  Surgeon: [name], MD;  Location: [facility] ENDOSCOPY;  Service: Cardiovascular;  Laterality: N/A;   CARDIOVERSION N/A 04/16/2021   Procedure: CARDIOVERSION;  Surgeon: Skeet Latch, MD;  Location: [facility];  Service: Cardiovascular;  Laterality: N/A;   COLONOSCOPY     DILATION AND CURETTAGE OF UTERUS     JOINT REPLACEMENT     TONSILLECTOMY     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   Procedure: TOTAL HIP ARTHROPLASTY ANTERIOR APPROACH;  Surgeon: Dorna Leitz, MD;  Location: [facility];  Service: Orthopedics;  Laterality:  Left;    OB History   No obstetric history on file.      Home Medications    Prior to Admission medications   Medication Sig Start Date End Date Taking? Authorizing Provider  amoxicillin (AMOXIL) 500 MG capsule 2,000 mg as directed. Prior to dental procedures 11/27/20   [provider]  conjugated estrogens (PREMARIN) vaginal cream Place 1 Applicatorful vaginally every Wednesday.    [provider]  ELIQUIS 5 MG TABS tablet TAKE 1 TABLET(5 MG) BY MOUTH TWICE DAILY 06/03/21   [name], MD  loratadine (CLARITIN) 10 MG tablet Take 10 mg by mouth daily as needed for allergies.    [provider]  melatonin 3 MG TABS tablet Take 3 mg by mouth at bedtime.    [provider]  NON FORMULARY Take 2 tablets by mouth daily. BONE-UP    [provider]  Omega-3 Fatty Acids (FISH OIL PO) Take 3 capsules by mouth daily.    [provider]  rosuvastatin (CRESTOR) 20 MG tablet Take 1 tablet (20 mg total) by mouth daily. 05/30/21   Kroeger, Lorelee Cover., PA-C  Simethicone (GAS-X PO) Take 1 capsule by mouth as needed (gas).    [provider]  Varenicline Tartrate (TYRVAYA) 0.03 MG/ACT SOLN Place 1 spray into both nostrils 2 (two) times daily.    [provider]    Family History Family History  Problem Relation Age of Onset   Hypertension Mother    Cancer Father    Diabetes Sister    Anxiety disorder Daughter    Breast cancer Neg Hx     Social History Social History   Tobacco Use   Smoking status: Former    Packs/day: 2.00    Types: Cigarettes   Smokeless tobacco: Never   Tobacco comments:    "quit smoking mid-late 1980s"  Vaping Use   Vaping Use: Never used  Substance Use Topics   Alcohol use: Not Currently    Comment: 3 drinks weekly   Drug use: No     Allergies   Nsaids and Prednisone   Review of Systems Review of Systems  Constitutional: Negative.   HENT: Negative.    Eyes: Negative.   Respiratory: Negative.    Cardiovascular:  Positive for palpitations (resolved in office -occurred this morning). Negative for chest pain and leg swelling.  Gastrointestinal: Negative.   Endocrine: Negative.   Genitourinary: Negative.   Musculoskeletal: Negative.   Allergic/Immunologic: Negative.   Neurological: Negative.   Hematological: Negative.   Psychiatric/Behavioral: Negative.    All other systems reviewed and are negative.   Physical Exam Triage Vital Signs ED Triage Vitals  Enc Vitals Group     BP 07/24/21 1516 (!) 109/96     Pulse Rate 07/24/21 1516 71     Resp 07/24/21 1516 18     Temp 07/24/21 1516 97.8  F (36.6 C)     Temp src --      SpO2 07/24/21 1516 98 %     Weight --      Height --      Head Circumference --      Peak Flow --      Pain Score 07/24/21 1514 0     Pain Loc --      Pain Edu? --      Excl. in [place]? --    No data found.  Updated Vital Signs BP (!) 109/96    Pulse 71  Temp 97.8 F (36.6 C)    Resp 18    SpO2 98%   Visual Acuity Right Eye Distance:   Left Eye Distance:   Bilateral Distance:    Right Eye Near:   Left Eye Near:    Bilateral Near:     Physical Exam Vitals and nursing note reviewed.  Constitutional:      General: She is not in acute distress.    Appearance: Normal appearance. She is well-developed and normal weight. She is not ill-appearing, toxic-appearing or diaphoretic.  HENT:     Head: Normocephalic and atraumatic.     Nose: Nose normal. No congestion or rhinorrhea.     Mouth/Throat:     Mouth: Mucous membranes are moist.  Eyes:     Conjunctiva/sclera: Conjunctivae normal.  Cardiovascular:     Rate and Rhythm: Normal rate. Rhythm irregular.     Pulses: Normal pulses.     Heart sounds: Normal heart sounds. No murmur heard.   No friction rub. No gallop.  Pulmonary:     Effort: Pulmonary effort is normal. No respiratory distress.     Breath sounds: Normal breath sounds. No stridor. No wheezing, rhonchi or rales.  Chest:     Chest wall: No tenderness.  Abdominal:     Palpations: Abdomen is soft.     Tenderness: There is no abdominal tenderness.  Musculoskeletal:        General: No swelling.     Cervical back: Normal range of motion and neck supple. No tenderness.     Right lower leg: No edema.     Left lower leg: No edema.  Lymphadenopathy:     Cervical: No cervical adenopathy.  Skin:    General: Skin is warm and dry.     Capillary Refill: Capillary refill takes less than 2 seconds.  Neurological:     Mental Status: She is alert.  Psychiatric:        Mood and Affect: Mood normal.     UC Treatments / Results   Labs (all labs ordered are listed, but only abnormal results are displayed) Labs Reviewed - No data to display  EKG   Radiology No results found.  Procedures ED EKG  Date/Time: 07/24/2021 9:26 PM Performed by: Chaney Malling, PA Authorized by: Chase Picket, MD   Interpretation:    Details:  Irregularly irregular Rate:    ECG rate:  71 bpm   ECG rate assessment: normal   Rhythm:    Rhythm: atrial fibrillation   QRS:    QRS axis:  Left   QRS conduction: normal   ST segments:    ST segments:  Normal T waves:    T waves: normal   (including critical care time)  Medications Ordered in UC Medications - No data to display  Initial Impression / Assessment and Plan / UC Course  I have reviewed the triage vital signs and the nursing notes.  Pertinent labs & imaging results that were available during my care of the patient were reviewed by me and considered in my medical decision making (see chart for details).  Clinical Course as of 07/24/21 2128  Wed Jul 24, 2021  1813 103/72 BP recheck [WC]    Clinical Course User Index [WC] Chaney Malling, [place]    Atrial fibrillation - rate is controlled, pt already on anticoagulation. No further treatment indicated today, vitals stable. Pt to reach out to cardiologist in the morning to discuss if further treatment options available vs  monitoring.  Hyperlipidemia - diet briefly discussed, increase monounsaturated fats, try to further elevate HDLs.  Final Clinical Impressions(s) / UC Diagnoses   Final diagnoses:  Paroxysmal atrial fibrillation [facility])     Discharge Instructions      Your symptoms today are likely due to your heart going back into Atrial Fibrillation. Your Blood pressure is perfect and your heart rate is 68. You are already on an anticoagulant. Please call your cardiologist in the morning to discuss if other options (flecainide, ablation, xio patch) for management of your A.fib. Avoid any stimulants such  as alcohol and caffeine.  Increase your HDL with monounsaturated fats.       ED Prescriptions   None    PDMP not reviewed this encounter.   Chaney Malling, [place] 07/24/21 2129

## 2021-07-24 NOTE — ED Triage Notes (Signed)
Pt stable and can wait in front lobby to be seen.

## 2021-07-25 ENCOUNTER — Encounter (HOSPITAL_COMMUNITY): Payer: Self-pay | Admitting: Physician Assistant

## 2021-07-25 ENCOUNTER — Telehealth: Payer: Self-pay | Admitting: Cardiology

## 2021-07-25 ENCOUNTER — Ambulatory Visit (HOSPITAL_COMMUNITY)
Admission: RE | Admit: 2021-07-25 | Discharge: 2021-07-25 | Disposition: A | Discharge status: Home / Self Care | Payer: PPO | Source: Ambulatory Visit | Attending: Physician Assistant | Admitting: Physician Assistant

## 2021-07-25 VITALS — BP 100/66 | HR 77 | Ht 67.0 in | Wt 128.8 lb

## 2021-07-25 DIAGNOSIS — Z7901 Long term (current) use of anticoagulants: Secondary | ICD-10-CM | POA: Insufficient documentation

## 2021-07-25 DIAGNOSIS — D6869 Other thrombophilia: Secondary | ICD-10-CM | POA: Diagnosis not present

## 2021-07-25 DIAGNOSIS — E785 Hyperlipidemia, unspecified: Secondary | ICD-10-CM | POA: Insufficient documentation

## 2021-07-25 DIAGNOSIS — I4819 Other persistent atrial fibrillation: Secondary | ICD-10-CM | POA: Insufficient documentation

## 2021-07-25 DIAGNOSIS — Z79899 Other long term (current) drug therapy: Secondary | ICD-10-CM | POA: Diagnosis not present

## 2021-07-25 DIAGNOSIS — I2510 Atherosclerotic heart disease of native coronary artery without angina pectoris: Secondary | ICD-10-CM | POA: Diagnosis not present

## 2021-07-25 NOTE — Telephone Encounter (Signed)
PT is calling to advise that she is out of rhythm since 06/28/21, she would like to know what she should do

## 2021-07-25 NOTE — Telephone Encounter (Signed)
Spoke to patient . She state she went [facility].  They informed her she was afib and asked her to call the office.  NO medication added per patient , EKG was done.  Patient's taking Eliquis 5 mg twice a day.  Appointment schedule for AFIB clinic today  Direction given.

## 2021-07-25 NOTE — Progress Notes (Signed)
Primary Care Physician: Sandrea Hughs, NP Primary Cardiologist: Dr Stanford Breed Primary Electrophysiologist: Dr Curt Bears Referring Physician: Antonieta Pert triage    Shelia [name] is a 80 y.o. female with a history of HLD, CAD, atrial fibrillation who presents for follow up in the [facility].  The patient was initially diagnosed with atrial fibrillation early 2021 and underwent DCCV on 11/17/19. She had recurrent symptoms of persistent afib and had repeat DCCV on 04/16/21. Patient is on Eliquis for a CHADS2VASC score of 4. She presented to urgent care 07/24/21 with palpitations and mild SOB. ECG showed rate controlled afib. There were no specific triggers that she could identify. She denies any bleeding issues on anticoagulation.   Today, she denies symptoms of chest pain, orthopnea, PND, lower extremity edema, dizziness, presyncope, syncope, snoring, daytime somnolence, bleeding, or neurologic sequela. The patient is tolerating medications without difficulties and is otherwise without complaint today.    Atrial Fibrillation Risk Factors:  she does not have symptoms or diagnosis of sleep apnea. she does not have a history of rheumatic fever. she does not have a history of alcohol use.   she has a BMI of Body mass index is 20.17 kg/m.Marland Kitchen Filed Weights   07/25/21 1417  Weight: 58.4 kg    Family History  Problem Relation Age of Onset   Hypertension Mother    Cancer Father    Diabetes Sister    Anxiety disorder Daughter    Breast cancer Neg Hx      Atrial Fibrillation Management history:  Previous antiarrhythmic drugs: none Previous cardioversions: 11/17/19, 04/16/21 Previous ablations: none CHADS2VASC score: 4 Anticoagulation history: Eliquis   Past Medical History:  Diagnosis Date   Anxiety    Arthritis    "left knee; left hip; lower back; mild to moderate in right hip" (09/30/2016)   Atrial fibrillation (HCC)    Chronic lower back pain     Gastritis    Heart palpitations    History of bone density study 2018   History of mammogram 2021   History of MRI 2018   History of Papanicolaou smear of cervix    Hypercholesteremia    Osteopenia    Seasonal allergies    Past Surgical History:  Procedure Laterality Date   CARDIOVERSION N/A 11/17/2019   Procedure: CARDIOVERSION;  Surgeon: [name], MD;  Location: [place];  Service: Cardiovascular;  Laterality: N/A;   CARDIOVERSION N/A 04/16/2021   Procedure: CARDIOVERSION;  Surgeon: Skeet Latch, MD;  Location: [place];  Service: Cardiovascular;  Laterality: N/A;   COLONOSCOPY     DILATION AND CURETTAGE OF UTERUS     JOINT REPLACEMENT     TONSILLECTOMY     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   Procedure: TOTAL HIP ARTHROPLASTY ANTERIOR APPROACH;  Surgeon: Dorna Leitz, MD;  Location: [place];  Service: Orthopedics;  Laterality: Left;    Current Outpatient Medications  Medication Sig Dispense Refill   amoxicillin (AMOXIL) 500 MG capsule 2,000 mg as directed. Prior to dental procedures     conjugated estrogens (PREMARIN) vaginal cream Place 1 Applicatorful vaginally every Wednesday.     ELIQUIS 5 MG TABS tablet TAKE 1 TABLET(5 MG) BY MOUTH TWICE DAILY 180 tablet 1   loratadine (CLARITIN) 10 MG tablet Take 10 mg by mouth daily as needed for allergies.     melatonin 3 MG TABS tablet Take 3 mg by mouth at bedtime.     NON FORMULARY Take 2 tablets by mouth daily. BONE-UP  Omega-3 Fatty Acids (FISH OIL PO) Take 3 capsules by mouth daily.     rosuvastatin (CRESTOR) 20 MG tablet Take 1 tablet (20 mg total) by mouth daily. 90 tablet 1   Simethicone (GAS-X PO) Take 1 capsule by mouth as needed (gas).     Varenicline Tartrate (TYRVAYA) 0.03 MG/ACT SOLN Place 1 spray into both nostrils 2 (two) times daily.     No current facility-administered medications for this encounter.    Allergies  Allergen Reactions   Nsaids Other (See Comments)    Caused painful stomach  problems   Prednisone Other (See Comments)    Caused painful stomach problems    Social History   Socioeconomic History   Marital status: Married    Spouse name: Not on file   Number of children: Not on file   Years of education: Not on file   Highest education level: Not on file  Occupational History   Not on file  Tobacco Use   Smoking status: Former    Packs/day: 2.00    Types: Cigarettes   Smokeless tobacco: Never   Tobacco comments:    "quit smoking mid-late 1980s"  Vaping Use   Vaping Use: Never used  Substance and Sexual Activity   Alcohol use: Not Currently    Comment: 3 drinks weekly   Drug use: Never   Sexual activity: Yes  Other Topics Concern   Not on file  Social History Narrative   Tobacco use, amount per day now: None.   Past tobacco use, amount per day: Maximum 2 packs    How many years did you use tobacco: Quit 1985   Alcohol use (drinks per week): 3   Diet: Vegetables, Chicken, Fish, Grains, and Fruit.   Do you drink/eat things with caffeine: Coffee   Marital status: Married                                  What year were you married? 1969   Do you live in a house, apartment, assisted living, condo, trailer, etc.? House.   Is it one or more stories? 2 stories.   How many persons live in your home? 2   Do you have pets in your home?( please list) No.   Highest Level of education completed? Masters in Adult Educations.   Current or past profession: Regulatory affairs officer    Do you exercise? Yes.                                  Type and how often? Floor excercises 5 times week. Walk 2 miles 6 times week.   Do you have a living will? Yes   Do you have a DNR form?  Yes                                 If not, do you want to discuss one?   Do you have signed POA/HPOA forms?  Yes                      If so, please bring to you appointment      Do you have any difficulty bathing or dressing yourself? No   Do you have any difficulty preparing food or eating? No    Do  you have any difficulty managing your medications? No   Do you have any difficulty managing your finances? No   Do you have any difficulty affording your medications? No   Social Determinants of Radio broadcast assistant Strain: Not on file  Food Insecurity: Not on file  Transportation Needs: Not on file  Physical Activity: Not on file  Stress: Not on file  Social Connections: Not on file  Intimate Partner Violence: Not on file     ROS- All systems are reviewed and negative except as per the HPI above.  Physical Exam: Vitals:   07/25/21 1417  BP: 100/66  Pulse: 77  Weight: 58.4 kg  Height: 5\' 7"  (1.702 m)    GEN- The patient is a well appearing elderly female, alert and oriented x 3 today.   Head- normocephalic, atraumatic Eyes-  Sclera clear, conjunctiva pink Ears- hearing intact Oropharynx- clear Neck- supple  Lungs- Clear to ausculation bilaterally, normal work of breathing Heart- irregular rate and rhythm, no murmurs, rubs or gallops  GI- soft, NT, ND, + BS Extremities- no clubbing, cyanosis, or edema MS- no significant deformity or atrophy Skin- no rash or lesion Psych- euthymic mood, full affect Neuro- strength and sensation are intact  Wt Readings from Last 3 Encounters:  07/25/21 58.4 kg  05/28/21 58.9 kg  05/09/21 59.8 kg    EKG today demonstrates  Afib Vent. rate 77 BPM PR interval * ms QRS duration 78 ms QT/QTcB 352/398 ms  Echo 10/31/19 demonstrated   1. Left ventricular ejection fraction, by estimation, is 60 to 65%. The  left ventricle has normal function. The left ventricle has no regional  wall motion abnormalities. Left ventricular diastolic parameters are  indeterminate.   2. Right ventricular systolic function is normal. The right ventricular  size is normal. There is normal pulmonary artery systolic pressure.   3. Left atrial size was moderately dilated.   4. The mitral valve is normal in structure. Mild mitral valve   regurgitation. No evidence of mitral stenosis.   5. The aortic valve is normal in structure. Aortic valve regurgitation is  trivial. No aortic stenosis is present.   6. The inferior vena cava is normal in size with greater than 50%  respiratory variability, suggesting right atrial pressure of 3 mmHg.   Epic records are reviewed at length today  CHA2DS2-VASc Score = 4  The patient's score is based upon: CHF History: 0 HTN History: 0 Diabetes History: 0 Stroke History: 0 Vascular Disease History: 1 Age Score: 2 Gender Score: 1       ASSESSMENT AND PLAN: 1. Persistent Atrial Fibrillation (ICD10:  I48.19) The patient's CHA2DS2-VASc score is 4, indicating a 4.8% annual risk of stroke.   We discussed rhythm control options today including AAD (flecainide, dofetilide, amiodarone) vs ablation. She did become bradycardic on beta blockers in the past, flecainide may not be best option with CAD. She would like to take time to consider her options. She will call the clinic back with decision. Continue Eliquis 5 mg BID  2. Secondary Hypercoagulable State (ICD10:  D68.69) The patient is at significant risk for stroke/thromboembolism based upon her CHA2DS2-VASc Score of 4.  Continue Apixaban (Eliquis).   3. CAD Coronary calcium score 192 (63rd percentile) FFR negative for significant stenosis No anginal symptoms.   Follow up with Dr Stanford Breed as scheduled. Follow up AF clinic (for dofetilide) or Dr Curt Bears (for ablation).   [facility] [address]  [place], [place] [address] [phone] 07/25/2021 3:27 PM

## 2021-07-26 ENCOUNTER — Other Ambulatory Visit (HOSPITAL_COMMUNITY): Payer: Self-pay | Admitting: *Deleted

## 2021-07-26 ENCOUNTER — Telehealth (HOSPITAL_COMMUNITY): Payer: Self-pay | Admitting: *Deleted

## 2021-07-26 ENCOUNTER — Encounter (HOSPITAL_COMMUNITY): Payer: Self-pay

## 2021-07-26 DIAGNOSIS — I4819 Other persistent atrial fibrillation: Secondary | ICD-10-CM

## 2021-07-26 LAB — HEPATIC FUNCTION PANEL
ALT: 31 IU/L (ref 0–32)
AST: 45 IU/L — ABNORMAL HIGH (ref 0–40)
Albumin: 4.5 g/dL (ref 3.7–4.7)
Alkaline Phosphatase: 54 IU/L (ref 44–121)
Bilirubin Total: 0.5 mg/dL (ref 0.0–1.2)
Bilirubin, Direct: 0.16 mg/dL (ref 0.00–0.40)
Total Protein: 5.8 g/dL — ABNORMAL LOW (ref 6.0–8.5)

## 2021-07-26 LAB — LIPID PANEL
Chol/HDL Ratio: 2.4 ratio (ref 0.0–4.4)
Cholesterol, Total: 127 mg/dL (ref 100–199)
HDL: 54 mg/dL (ref >39)
LDL Chol Calc (NIH): 62 mg/dL (ref 0–99)
Triglycerides: 46 mg/dL (ref 0–149)
VLDL Cholesterol Cal: 11 mg/dL (ref 5–40)

## 2021-07-26 NOTE — Telephone Encounter (Signed)
Patient called in stating she decided to pursue cardioversion for now as she has a upcoming trip she would like to take before making determining long term afib plan. Pt cardioversion instructions reviewed and follow up appt made. Pt in agreement.

## 2021-07-30 NOTE — H&P (View-Only) (Signed)
HPI: Follow-up atrial fibrillation.  Patient previously seen for newly diagnosed atrial fibrillation.  Echocardiogram April 2021 showed normal LV function, moderate left atrial enlargement, mild mitral regurgitation and trace aortic insufficiency.  Patient had successful cardioversion November 17, 2019.  Cardiac CTA September 2022 showed calcium score 192 which was 63rd percentile, 25 to 49% proximal LAD which had negative FFR.  Patient had repeat cardioversion September 2022.  Patient seen in December with recurrent atrial fibrillation.  There was discussion about initiation of antiarrhythmic with repeat cardioversion versus ablation.  Since last seen he has some fatigue with vigorous activities.  She denies dyspnea, chest pain, palpitations or syncope.  No bleeding.  She has some lightheaded feelings which improves with eating or drinking.  Current Outpatient Medications  Medication Sig Dispense Refill   amoxicillin (AMOXIL) 500 MG capsule Take 2,000 mg by mouth See admin instructions. Prior to dental procedures     Brimonidine Tartrate (LUMIFY) 0.025 % SOLN Place 1 drop into both eyes daily as needed (red eyes).     Carboxymeth-Glyc-Polysorb PF (REFRESH OPTIVE MEGA-3) 0.5-1-0.5 % SOLN Place 1 drop into both eyes 4 (four) times daily as needed (dry eyes).     conjugated estrogens (PREMARIN) vaginal cream Place 1 Applicatorful vaginally every Wednesday.     ELIQUIS 5 MG TABS tablet TAKE 1 TABLET(5 MG) BY MOUTH TWICE DAILY 180 tablet 1   ketotifen (ZADITOR) 0.025 % ophthalmic solution Place 1 drop into both eyes 2 (two) times daily as needed (itchy eyes).     loratadine (CLARITIN) 10 MG tablet Take 10 mg by mouth daily as needed for allergies.     melatonin 5 MG TABS Take 5 mg by mouth at bedtime.     NON FORMULARY Take 2 tablets by mouth daily. BONE-UP     Omega-3 Fatty Acids (FISH OIL PO) Take 3 capsules by mouth daily.     rosuvastatin (CRESTOR) 20 MG tablet Take 1 tablet (20 mg total) by  mouth daily. 90 tablet 1   Simethicone (GAS-X PO) Take 1 capsule by mouth as needed (gas).     Varenicline Tartrate (TYRVAYA) 0.03 MG/ACT SOLN Place 1 spray into both nostrils in the morning and at bedtime. (Patient not taking: Reported on 08/13/2021)     No current facility-administered medications for this visit.     Past Medical History:  Diagnosis Date   Anxiety    Arthritis    "left knee; left hip; lower back; mild to moderate in right hip" (09/30/2016)   Atrial fibrillation (HCC)    Chronic lower back pain    Gastritis    Heart palpitations    History of bone density study 2018   History of mammogram 2021   History of MRI 2018   History of Papanicolaou smear of cervix    Hypercholesteremia    Osteopenia    Seasonal allergies     Past Surgical History:  Procedure Laterality Date   CARDIOVERSION N/A 11/17/2019   Procedure: CARDIOVERSION;  Surgeon: [name], MD;  Location: [place];  Service: Cardiovascular;  Laterality: N/A;   CARDIOVERSION N/A 04/16/2021   Procedure: CARDIOVERSION;  Surgeon: Skeet Latch, MD;  Location: [place];  Service: Cardiovascular;  Laterality: N/A;   COLONOSCOPY     DILATION AND CURETTAGE OF UTERUS     JOINT REPLACEMENT     TONSILLECTOMY     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   Procedure: TOTAL HIP ARTHROPLASTY ANTERIOR APPROACH;  Surgeon: Dorna Leitz, MD;  Location: [place];  Service: Orthopedics;  Laterality: Left;    Social History   Socioeconomic History   Marital status: Married    Spouse name: Not on file   Number of children: Not on file   Years of education: Not on file   Highest education level: Not on file  Occupational History   Not on file  Tobacco Use   Smoking status: Former    Packs/day: 2.00    Types: Cigarettes   Smokeless tobacco: Never   Tobacco comments:    "quit smoking mid-late 1980s"  Vaping Use   Vaping Use: Never used  Substance and Sexual Activity   Alcohol use: Not Currently     Comment: 3 drinks weekly   Drug use: Never   Sexual activity: Yes  Other Topics Concern   Not on file  Social History Narrative   Tobacco use, amount per day now: None.   Past tobacco use, amount per day: Maximum 2 packs    How many years did you use tobacco: Quit 1985   Alcohol use (drinks per week): 3   Diet: Vegetables, Chicken, Fish, Grains, and Fruit.   Do you drink/eat things with caffeine: Coffee   Marital status: Married                                  What year were you married? 1969   Do you live in a house, apartment, assisted living, condo, trailer, etc.? House.   Is it one or more stories? 2 stories.   How many persons live in your home? 2   Do you have pets in your home?( please list) No.   Highest Level of education completed? Masters in Adult Educations.   Current or past profession: Regulatory affairs officer    Do you exercise? Yes.                                  Type and how often? Floor excercises 5 times week. Walk 2 miles 6 times week.   Do you have a living will? Yes   Do you have a DNR form?  Yes                                 If not, do you want to discuss one?   Do you have signed POA/HPOA forms?  Yes                      If so, please bring to you appointment      Do you have any difficulty bathing or dressing yourself? No   Do you have any difficulty preparing food or eating? No   Do you have any difficulty managing your medications? No   Do you have any difficulty managing your finances? No   Do you have any difficulty affording your medications? No   Social Determinants of Radio broadcast assistant Strain: Not on file  Food Insecurity: Not on file  Transportation Needs: Not on file  Physical Activity: Not on file  Stress: Not on file  Social Connections: Not on file  Intimate Partner Violence: Not on file    Family History  Problem Relation Age of Onset   Hypertension Mother    Cancer Father  Diabetes Sister    Anxiety disorder Daughter     Breast cancer Neg Hx     ROS: no fevers or chills, productive cough, hemoptysis, dysphasia, odynophagia, melena, hematochezia, dysuria, hematuria, rash, seizure activity, orthopnea, PND, pedal edema, claudication. Remaining systems are negative.  Physical Exam: Well-developed well-nourished in no acute distress.  Skin is warm and dry.  HEENT is normal.  Neck is supple.  Chest is clear to auscultation with normal expansion.  Cardiovascular exam is irregular Abdominal exam nontender or distended. No masses palpated. Extremities show no edema. neuro grossly intact  ECG-atrial fibrillation at a rate of 66, left axis deviation, cannot rule out septal infarct.  Personally reviewed  A/P  1 persistent atrial fibrillation-patient is scheduled for cardioversion later this week.  She is then scheduled to see the atrial fibrillation clinic and likely wants to proceed with ablation.  We will continue apixaban.  Her heart rate is controlled on no medications.  We will check hemoglobin and renal function.  We will repeat echocardiogram.  2 hyperlipidemia-continue statin.  3 coronary artery disease-mild on recent CTA.  Continue statin.  She is not on aspirin given need for anticoagulation.  She denies chest pain.  Kirk Ruths, MD

## 2021-07-30 NOTE — Progress Notes (Signed)
HPI: Follow-up atrial fibrillation.  Patient previously seen for newly diagnosed atrial fibrillation.  Echocardiogram April 2021 showed normal LV function, moderate left atrial enlargement, mild mitral regurgitation and trace aortic insufficiency.  Patient had successful cardioversion November 17, 2019.  Cardiac CTA September 2022 showed calcium score 192 which was 63rd percentile, 25 to 49% proximal LAD which had negative FFR.  Patient had repeat cardioversion September 2022.  Patient seen in December with recurrent atrial fibrillation.  There was discussion about initiation of antiarrhythmic with repeat cardioversion versus ablation.  Since last seen he has some fatigue with vigorous activities.  She denies dyspnea, chest pain, palpitations or syncope.  No bleeding.  She has some lightheaded feelings which improves with eating or drinking.  Current Outpatient Medications  Medication Sig Dispense Refill   amoxicillin (AMOXIL) 500 MG capsule Take 2,000 mg by mouth See admin instructions. Prior to dental procedures     Brimonidine Tartrate (LUMIFY) 0.025 % SOLN Place 1 drop into both eyes daily as needed (red eyes).     Carboxymeth-Glyc-Polysorb PF (REFRESH OPTIVE MEGA-3) 0.5-1-0.5 % SOLN Place 1 drop into both eyes 4 (four) times daily as needed (dry eyes).     conjugated estrogens (PREMARIN) vaginal cream Place 1 Applicatorful vaginally every Wednesday.     ELIQUIS 5 MG TABS tablet TAKE 1 TABLET(5 MG) BY MOUTH TWICE DAILY 180 tablet 1   ketotifen (ZADITOR) 0.025 % ophthalmic solution Place 1 drop into both eyes 2 (two) times daily as needed (itchy eyes).     loratadine (CLARITIN) 10 MG tablet Take 10 mg by mouth daily as needed for allergies.     melatonin 5 MG TABS Take 5 mg by mouth at bedtime.     NON FORMULARY Take 2 tablets by mouth daily. BONE-UP     Omega-3 Fatty Acids (FISH OIL PO) Take 3 capsules by mouth daily.     rosuvastatin (CRESTOR) 20 MG tablet Take 1 tablet (20 mg total) by  mouth daily. 90 tablet 1   Simethicone (GAS-X PO) Take 1 capsule by mouth as needed (gas).     Varenicline Tartrate (TYRVAYA) 0.03 MG/ACT SOLN Place 1 spray into both nostrils in the morning and at bedtime. (Patient not taking: Reported on 08/13/2021)     No current facility-administered medications for this visit.     Past Medical History:  Diagnosis Date   Anxiety    Arthritis    "left knee; left hip; lower back; mild to moderate in right hip" (09/30/2016)   Atrial fibrillation (HCC)    Chronic lower back pain    Gastritis    Heart palpitations    History of bone density study 2018   History of mammogram 2021   History of MRI 2018   History of Papanicolaou smear of cervix    Hypercholesteremia    Osteopenia    Seasonal allergies     Past Surgical History:  Procedure Laterality Date   CARDIOVERSION N/A 11/17/2019   Procedure: CARDIOVERSION;  Surgeon: [name], MD;  Location: [place];  Service: Cardiovascular;  Laterality: N/A;   CARDIOVERSION N/A 04/16/2021   Procedure: CARDIOVERSION;  Surgeon: Skeet Latch, MD;  Location: [place];  Service: Cardiovascular;  Laterality: N/A;   COLONOSCOPY     DILATION AND CURETTAGE OF UTERUS     JOINT REPLACEMENT     TONSILLECTOMY     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   Procedure: TOTAL HIP ARTHROPLASTY ANTERIOR APPROACH;  Surgeon: Dorna Leitz, MD;  Location: [place];  Service: Orthopedics;  Laterality: Left;    Social History   Socioeconomic History   Marital status: Married    Spouse name: Not on file   Number of children: Not on file   Years of education: Not on file   Highest education level: Not on file  Occupational History   Not on file  Tobacco Use   Smoking status: Former    Packs/day: 2.00    Types: Cigarettes   Smokeless tobacco: Never   Tobacco comments:    "quit smoking mid-late 1980s"  Vaping Use   Vaping Use: Never used  Substance and Sexual Activity   Alcohol use: Not Currently     Comment: 3 drinks weekly   Drug use: Never   Sexual activity: Yes  Other Topics Concern   Not on file  Social History Narrative   Tobacco use, amount per day now: None.   Past tobacco use, amount per day: Maximum 2 packs    How many years did you use tobacco: Quit 1985   Alcohol use (drinks per week): 3   Diet: Vegetables, Chicken, Fish, Grains, and Fruit.   Do you drink/eat things with caffeine: Coffee   Marital status: Married                                  What year were you married? 1969   Do you live in a house, apartment, assisted living, condo, trailer, etc.? House.   Is it one or more stories? 2 stories.   How many persons live in your home? 2   Do you have pets in your home?( please list) No.   Highest Level of education completed? Masters in Adult Educations.   Current or past profession: Regulatory affairs officer    Do you exercise? Yes.                                  Type and how often? Floor excercises 5 times week. Walk 2 miles 6 times week.   Do you have a living will? Yes   Do you have a DNR form?  Yes                                 If not, do you want to discuss one?   Do you have signed POA/HPOA forms?  Yes                      If so, please bring to you appointment      Do you have any difficulty bathing or dressing yourself? No   Do you have any difficulty preparing food or eating? No   Do you have any difficulty managing your medications? No   Do you have any difficulty managing your finances? No   Do you have any difficulty affording your medications? No   Social Determinants of Radio broadcast assistant Strain: Not on file  Food Insecurity: Not on file  Transportation Needs: Not on file  Physical Activity: Not on file  Stress: Not on file  Social Connections: Not on file  Intimate Partner Violence: Not on file    Family History  Problem Relation Age of Onset   Hypertension Mother    Cancer Father  Diabetes Sister    Anxiety disorder Daughter     Breast cancer Neg Hx     ROS: no fevers or chills, productive cough, hemoptysis, dysphasia, odynophagia, melena, hematochezia, dysuria, hematuria, rash, seizure activity, orthopnea, PND, pedal edema, claudication. Remaining systems are negative.  Physical Exam: Well-developed well-nourished in no acute distress.  Skin is warm and dry.  HEENT is normal.  Neck is supple.  Chest is clear to auscultation with normal expansion.  Cardiovascular exam is irregular Abdominal exam nontender or distended. No masses palpated. Extremities show no edema. neuro grossly intact  ECG-atrial fibrillation at a rate of 66, left axis deviation, cannot rule out septal infarct.  Personally reviewed  A/P  1 persistent atrial fibrillation-patient is scheduled for cardioversion later this week.  She is then scheduled to see the atrial fibrillation clinic and likely wants to proceed with ablation.  We will continue apixaban.  Her heart rate is controlled on no medications.  We will check hemoglobin and renal function.  We will repeat echocardiogram.  2 hyperlipidemia-continue statin.  3 coronary artery disease-mild on recent CTA.  Continue statin.  She is not on aspirin given need for anticoagulation.  She denies chest pain.  Kirk Ruths, MD

## 2021-07-31 DIAGNOSIS — L565 Disseminated superficial actinic porokeratosis (DSAP): Secondary | ICD-10-CM | POA: Diagnosis not present

## 2021-07-31 DIAGNOSIS — M21611 Bunion of right foot: Secondary | ICD-10-CM | POA: Diagnosis not present

## 2021-07-31 DIAGNOSIS — M21612 Bunion of left foot: Secondary | ICD-10-CM | POA: Diagnosis not present

## 2021-07-31 DIAGNOSIS — M2041 Other hammer toe(s) (acquired), right foot: Secondary | ICD-10-CM | POA: Diagnosis not present

## 2021-07-31 DIAGNOSIS — D2371 Other benign neoplasm of skin of right lower limb, including hip: Secondary | ICD-10-CM | POA: Diagnosis not present

## 2021-08-06 ENCOUNTER — Encounter (HOSPITAL_COMMUNITY): Payer: Self-pay | Admitting: Internal Medicine

## 2021-08-13 ENCOUNTER — Other Ambulatory Visit: Payer: Self-pay

## 2021-08-13 ENCOUNTER — Encounter: Payer: Self-pay | Admitting: Cardiology

## 2021-08-13 ENCOUNTER — Ambulatory Visit: Payer: PPO | Admitting: Cardiology

## 2021-08-13 VITALS — BP 86/50 | HR 66 | Ht 67.0 in | Wt 127.8 lb

## 2021-08-13 DIAGNOSIS — I4819 Other persistent atrial fibrillation: Secondary | ICD-10-CM

## 2021-08-13 DIAGNOSIS — E785 Hyperlipidemia, unspecified: Secondary | ICD-10-CM | POA: Diagnosis not present

## 2021-08-13 DIAGNOSIS — I2510 Atherosclerotic heart disease of native coronary artery without angina pectoris: Secondary | ICD-10-CM | POA: Diagnosis not present

## 2021-08-13 LAB — CBC
Hematocrit: 40.7 % (ref 34.0–46.6)
Hemoglobin: 14.5 g/dL (ref 11.1–15.9)
MCH: 34.4 pg — ABNORMAL HIGH (ref 26.6–33.0)
MCHC: 35.6 g/dL (ref 31.5–35.7)
MCV: 97 fL (ref 79–97)
Platelets: 137 10*3/uL — ABNORMAL LOW (ref 150–450)
RBC: 4.21 x10E6/uL (ref 3.77–5.28)
RDW: 11.3 % — ABNORMAL LOW (ref 11.7–15.4)
WBC: 2.9 10*3/uL — ABNORMAL LOW (ref 3.4–10.8)

## 2021-08-13 LAB — BASIC METABOLIC PANEL
BUN/Creatinine Ratio: 30 — ABNORMAL HIGH (ref 12–28)
BUN: 26 mg/dL (ref 8–27)
CO2: 27 mmol/L (ref 20–29)
Calcium: 9.4 mg/dL (ref 8.7–10.3)
Chloride: 105 mmol/L (ref 96–106)
Creatinine, Ser: 0.86 mg/dL (ref 0.57–1.00)
Glucose: 73 mg/dL (ref 70–99)
Potassium: 4.3 mmol/L (ref 3.5–5.2)
Sodium: 144 mmol/L (ref 134–144)
eGFR: 69 mL/min/{1.73_m2} (ref >59)

## 2021-08-13 NOTE — Patient Instructions (Signed)
TESTING:  Your physician has requested that you have an echocardiogram. Echocardiography is a painless test that uses sound waves to create images of your heart. It provides your doctor with information about the size and shape of your heart and how well your hearts chambers and valves are working. This procedure takes approximately one hour. There are no restrictions for this procedure. [place]   Follow-Up: At [facility], you and your health needs are our priority.  As part of our continuing mission to provide you with exceptional heart care, we have created designated Provider Care Teams.  These Care Teams include your primary Cardiologist (physician) and Advanced Practice Providers (APPs -  Physician Assistants and Nurse Practitioners) who all work together to provide you with the care you need, when you need it.  We recommend signing up for the patient portal called "MyChart".  Sign up information is provided on this After Visit Summary.  MyChart is used to connect with patients for Virtual Visits (Telemedicine).  Patients are able to view lab/test results, encounter notes, upcoming appointments, etc.  Non-urgent messages can be sent to your provider as well.   To learn more about what you can do with MyChart, go to [email or web address].    Your next appointment:   3 month(s)  The format for your next appointment:   In Person  Provider:   Kirk Ruths MD }

## 2021-08-15 ENCOUNTER — Encounter (HOSPITAL_COMMUNITY): Payer: Self-pay | Admitting: Internal Medicine

## 2021-08-15 ENCOUNTER — Ambulatory Visit (HOSPITAL_COMMUNITY)
Admission: RE | Admit: 2021-08-15 | Discharge: 2021-08-15 | Disposition: A | Discharge status: Home / Self Care | Payer: PPO | Attending: Internal Medicine | Admitting: Internal Medicine

## 2021-08-15 ENCOUNTER — Ambulatory Visit (HOSPITAL_COMMUNITY): Payer: PPO | Admitting: Certified Registered Nurse Anesthetist

## 2021-08-15 ENCOUNTER — Other Ambulatory Visit: Payer: Self-pay

## 2021-08-15 ENCOUNTER — Encounter: Payer: Self-pay | Admitting: Family

## 2021-08-15 ENCOUNTER — Ambulatory Visit (INDEPENDENT_AMBULATORY_CARE_PROVIDER_SITE_OTHER): Payer: PPO | Admitting: Family

## 2021-08-15 ENCOUNTER — Encounter (HOSPITAL_COMMUNITY)
Admission: RE | Disposition: A | Discharge status: Home / Self Care | Payer: Self-pay | Source: Home / Self Care | Attending: Internal Medicine

## 2021-08-15 ENCOUNTER — Ambulatory Visit: Payer: PPO | Admitting: Family

## 2021-08-15 VITALS — BP 100/60 | Temp 97.1°F | Resp 16 | Ht 67.0 in | Wt 130.0 lb

## 2021-08-15 DIAGNOSIS — D696 Thrombocytopenia, unspecified: Secondary | ICD-10-CM | POA: Diagnosis not present

## 2021-08-15 DIAGNOSIS — I4819 Other persistent atrial fibrillation: Secondary | ICD-10-CM | POA: Insufficient documentation

## 2021-08-15 DIAGNOSIS — Z7901 Long term (current) use of anticoagulants: Secondary | ICD-10-CM | POA: Insufficient documentation

## 2021-08-15 DIAGNOSIS — D709 Neutropenia, unspecified: Secondary | ICD-10-CM

## 2021-08-15 DIAGNOSIS — F419 Anxiety disorder, unspecified: Secondary | ICD-10-CM | POA: Diagnosis not present

## 2021-08-15 DIAGNOSIS — I2510 Atherosclerotic heart disease of native coronary artery without angina pectoris: Secondary | ICD-10-CM | POA: Diagnosis not present

## 2021-08-15 DIAGNOSIS — E785 Hyperlipidemia, unspecified: Secondary | ICD-10-CM | POA: Diagnosis not present

## 2021-08-15 DIAGNOSIS — I4891 Unspecified atrial fibrillation: Secondary | ICD-10-CM

## 2021-08-15 DIAGNOSIS — M1990 Unspecified osteoarthritis, unspecified site: Secondary | ICD-10-CM | POA: Diagnosis not present

## 2021-08-15 HISTORY — PX: CARDIOVERSION: SHX1299

## 2021-08-15 SURGERY — CARDIOVERSION
Anesthesia: General

## 2021-08-15 MED ORDER — SODIUM CHLORIDE 0.9 % IV SOLN: INTRAVENOUS | Status: DC

## 2021-08-15 MED ORDER — PROPOFOL 10 MG/ML IV BOLUS
INTRAVENOUS | Status: DC | PRN
  Administered 2021-08-15: 50 mg via INTRAVENOUS

## 2021-08-15 MED ORDER — LIDOCAINE 2% (20 MG/ML) 5 ML SYRINGE
INTRAMUSCULAR | Status: DC | PRN
  Administered 2021-08-15: 80 mg via INTRAVENOUS

## 2021-08-15 NOTE — Anesthesia Preprocedure Evaluation (Signed)
Anesthesia Evaluation  Patient identified by MRN, date of birth, ID band Patient awake    Reviewed: Allergy & Precautions, NPO status , Patient's Chart, lab work & pertinent test results  Airway Mallampati: II  TM Distance: >3 FB Neck ROM: Full    Dental no notable dental hx.    Pulmonary neg pulmonary ROS, former smoker,    Pulmonary exam normal breath sounds clear to auscultation       Cardiovascular Exercise Tolerance: Good Normal cardiovascular exam+ dysrhythmias Atrial Fibrillation  Rhythm:Regular Rate:Normal     Neuro/Psych Anxiety  Neuromuscular disease (lumbar radiculopathy)    GI/Hepatic negative GI ROS, Neg liver ROS,   Endo/Other  negative endocrine ROS  Renal/GU negative Renal ROS  negative genitourinary   Musculoskeletal  (+) Arthritis , Osteoarthritis,    Abdominal   Peds negative pediatric ROS (+)  Hematology negative hematology ROS (+)   Anesthesia Other Findings   Reproductive/Obstetrics negative OB ROS                             Anesthesia Physical Anesthesia Plan  ASA: 3  Anesthesia Plan: General   Post-op Pain Management:    Induction: Intravenous  PONV Risk Score and Plan: 3 and Propofol infusion and Treatment may vary due to age or medical condition  Airway Management Planned: Mask  Additional Equipment: None  Intra-op Plan:   Post-operative Plan: Extubation in OR  Informed Consent: I have reviewed the patients History and Physical, chart, labs and discussed the procedure including the risks, benefits and alternatives for the proposed anesthesia with the patient or authorized representative who has indicated his/her understanding and acceptance.     Dental advisory given  Plan Discussed with: CRNA, Anesthesiologist and Surgeon  Anesthesia Plan Comments:         Anesthesia Quick Evaluation

## 2021-08-15 NOTE — Transfer of Care (Signed)
Immediate Anesthesia Transfer of Care Note  Patient: Shelia [name]  Procedure(s) Performed: CARDIOVERSION  Patient Location: Endoscopy Unit  Anesthesia Type:General  Level of Consciousness: awake, alert , oriented and drowsy  Airway & Oxygen Therapy: Patient Spontanous Breathing and Patient connected to face mask oxygen  Post-op Assessment: Report given to RN and Post -op Vital signs reviewed and stable  Post vital signs: Reviewed and stable  Last Vitals:  Vitals Value Taken Time  BP    Temp    Pulse    Resp    SpO2      Last Pain:  Vitals:   08/15/21 0750  TempSrc: Temporal  PainSc: 0-No pain         Complications: No notable events documented.

## 2021-08-15 NOTE — Anesthesia Procedure Notes (Signed)
Procedure Name: General with mask airway Date/Time: 08/15/2021 8:39 AM Performed by: Mariea Clonts, CRNA Pre-anesthesia Checklist: Timeout performed, Patient being monitored, Suction available, Emergency Drugs available and Patient identified Patient Re-evaluated:Patient Re-evaluated prior to induction Oxygen Delivery Method: Simple face mask and Circle system utilized Preoxygenation: Pre-oxygenation with 100% oxygen Induction Type: IV induction

## 2021-08-15 NOTE — Anesthesia Postprocedure Evaluation (Signed)
Anesthesia Post Note  Patient: Shelia [name]  Procedure(s) Performed: CARDIOVERSION     Patient location during evaluation: PACU Anesthesia Type: General Level of consciousness: awake Pain management: pain level controlled Vital Signs Assessment: post-procedure vital signs reviewed and stable Respiratory status: spontaneous breathing and respiratory function stable Cardiovascular status: stable Postop Assessment: no apparent nausea or vomiting Anesthetic complications: no   No notable events documented.  Last Vitals:  Vitals:   08/15/21 0856 08/15/21 0901  BP:    Pulse: 76 68  Resp: 17 13  Temp:    SpO2: 100% 100%    Last Pain:  Vitals:   08/15/21 0845  TempSrc:   PainSc: 0-No pain                 Merlinda Frederick

## 2021-08-15 NOTE — Procedures (Signed)
Procedure: Electrical Cardioversion Indications:  Atrial Fibrillation  Procedure Details:  Consent: Risks of procedure as well as the alternatives and risks of each were explained to the (patient/caregiver).  Consent for procedure obtained.  Time Out: Verified patient identification, verified procedure, site/side was marked, verified correct patient position, special equipment/implants available, medications/allergies/relevent history reviewed, required imaging and test results available. PERFORMED.  Patient placed on cardiac monitor, pulse oximetry, supplemental oxygen as necessary.  Sedation given:  propofol Pacer pads placed anterior and posterior chest.  Cardioverted 1 time(s).  Cardioversion with synchronized biphasic 200J shock.  Evaluation: Findings: Post procedure EKG shows:  Sinus bradycardia heart rate 82P Complications: None Patient did tolerate procedure well.  Time Spent Directly with the Patient:  15 minutes   Shelia [name] 08/15/2021, 8:34 AM

## 2021-08-15 NOTE — Interval H&P Note (Signed)
History and Physical Interval Note:  08/15/2021 8:32 AM  Shelia [name]  has presented today for surgery, with the diagnosis of AFIB.  The various methods of treatment have been discussed with the patient and family. After consideration of risks, benefits and other options for treatment, the patient has consented to  Procedure(s): CARDIOVERSION (N/A) as a surgical intervention.  The patient's history has been reviewed, patient examined, no change in status, stable for surgery.  I have reviewed the patient's chart and labs.  Questions were answered to the patient's satisfaction.     Janina Mayo

## 2021-08-15 NOTE — Progress Notes (Signed)
° °Provider:   FNP-C ° °,  C, NP ° °Patient Care Team: °,  C, NP as PCP - General (Family Medicine) °Crenshaw, Brian S, MD as PCP - Cardiology (Cardiology) °Camnitz, Will Martin, MD as PCP - Electrophysiology (Cardiology) °Holland, Richard, MD as Consulting Physician (Obstetrics and Gynecology) °Crenshaw, Brian S, MD as Consulting Physician (Cardiology) °Lambeth, Angela, OD (Ophthalmology) ° °Extended Emergency Contact Information °Primary Emergency Contact: Sultan,Tom °Address: [address] °         [place] [address] [place] °Mobile Phone: [phone] °Relation: Spouse °Secondary Emergency Contact: Rathbun,Angela °Address: [address] °         [place], [place] [address] [place] °Mobile Phone: [phone] °Relation: Daughter ° °Code Status:  Full Code  °Goals of care: Advanced Directive information °Advanced Directives 08/15/2021  °Does Patient Have a Medical Advance Directive? Yes  °Type of Advance Directive Healthcare Power of Attorney  °Does patient want to make changes to medical advance directive? No - Patient declined  °Copy of Healthcare Power of Attorney in Chart? Yes - validated most recent copy scanned in chart (See row information)  ° ° ° °Chief Complaint  °Patient presents with  ° Acute Visit  °  Patient wants to discuss abnormal CBC results.   ° ° °HPI:  °Pt is a 80 y.o. female seen today for an acute visit to discuss abnormal CBC results.she had lab work done by cardiologist 08/13/2021 WBC 2.9 and Plts 137 previous WBC 5.6 and  Plts 157 were within normal range.Had low PLTs in the past 145 ; 139 (10/01/2016). °She denies any recent signs of bleeding.Also denies any recent acute illness.No new medication.  °She status post cardioversion today due to Afib  done by Dr.Mary Branch. °She was advised to follow up with PCP for evaluation of low WBC and Plts.   ° ° °Past Medical History:  °Diagnosis Date  ° Anxiety   ° Arthritis    ° "left knee; left hip; lower back; mild to moderate in right hip" (09/30/2016)  ° Atrial fibrillation (HCC)   ° Chronic lower back pain   ° Gastritis   ° Heart palpitations   ° History of bone density study 2018  ° History of mammogram 2021  ° History of MRI 2018  ° History of Papanicolaou smear of cervix   ° Hypercholesteremia   ° Osteopenia   ° Seasonal allergies   ° °Past Surgical History:  °Procedure Laterality Date  ° CARDIOVERSION N/A 11/17/2019  ° Procedure: CARDIOVERSION;  Surgeon: Crenshaw, Brian S, MD;  Location: [facility] ENDOSCOPY;  Service: Cardiovascular;  Laterality: N/A;  ° CARDIOVERSION N/A 04/16/2021  ° Procedure: CARDIOVERSION;  Surgeon: Wasco, Tiffany, MD;  Location: [facility] ENDOS[facility];  Service: Cardiovascular;  Laterality: N/A;  ° COLONOSCOPY    ° DILATION AND CURETTAGE OF UTERUS    ° JOINT REPLACEMENT    ° TONSILLECTOMY    ° 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  ° Procedure: TOTAL HIP ARTHROPLASTY ANTERIOR APPROACH;  Surgeon: Graves, John, MD;  Location: [facility] OR;  Service: Orthopedics;  Laterality: Left;  ° ° °Allergies  °Allergen Reactions  ° Nsaids Other (See Comments)  °  Caused painful stomach problems  ° Prednisone Other (See Comments)  °  Caused painful stomach problems  ° ° °Outpatient Encounter Medications as of 08/15/2021  °Medication Sig  ° amoxicillin (AMOXIL) 500 MG capsule Take 2,000 mg by mouth See admin instructions. Prior to dental procedures  ° Brimonidine Tartrate (LUMIFY) 0.025 %   SOLN Place 1 drop into both eyes daily as needed (red eyes).  ° Carboxymeth-Glyc-Polysorb PF (REFRESH OPTIVE MEGA-3) 0.5-1-0.5 % SOLN Place 1 drop into both eyes 4 (four) times daily as needed (dry eyes).  ° conjugated estrogens (PREMARIN) vaginal cream Place 1 Applicatorful vaginally every Wednesday.  ° ELIQUIS 5 MG TABS tablet TAKE 1 TABLET(5 MG) BY MOUTH TWICE DAILY  ° ketotifen (ZADITOR) 0.025 % ophthalmic solution Place 1 drop into both eyes 2 (two) times daily as needed (itchy eyes).  ° loratadine (CLARITIN)  10 MG tablet Take 10 mg by mouth daily as needed for allergies.  ° melatonin 5 MG TABS Take 5 mg by mouth at bedtime.  ° NON FORMULARY Take 2 tablets by mouth daily. BONE-UP  ° Omega-3 Fatty Acids (FISH OIL PO) Take 3 capsules by mouth daily.  ° rosuvastatin (CRESTOR) 20 MG tablet Take 1 tablet (20 mg total) by mouth daily.  ° Simethicone (GAS-X PO) Take 1 capsule by mouth as needed (gas).  ° Varenicline Tartrate (TYRVAYA) 0.03 MG/ACT SOLN Place 1 spray into both nostrils in the morning and at bedtime.  ° °No facility-administered encounter medications on file as of 08/15/2021.  ° ° °Review of Systems  °Constitutional:  Negative for appetite change, chills, fatigue, fever and unexpected weight change.  °HENT:  Negative for congestion, dental problem, ear discharge, ear pain, facial swelling, hearing loss, nosebleeds, postnasal drip, rhinorrhea, sinus pressure, sinus pain, sneezing, sore throat, tinnitus and trouble swallowing.   °Eyes:  Negative for pain, discharge, redness, itching and visual disturbance.  °Respiratory:  Negative for cough, chest tightness, shortness of breath and wheezing.   °Cardiovascular:  Negative for chest pain, palpitations and leg swelling.  °Gastrointestinal:  Negative for abdominal distention, abdominal pain, blood in stool, constipation, diarrhea, nausea and vomiting.  °Endocrine: Negative for cold intolerance, heat intolerance, polydipsia, polyphagia and polyuria.  °Genitourinary:  Negative for difficulty urinating, dysuria, flank pain, frequency and urgency.  °Musculoskeletal:  Negative for arthralgias, back pain, gait problem, joint swelling, myalgias, neck pain and neck stiffness.  °Skin:  Negative for color change, pallor, rash and wound.  °Neurological:  Negative for dizziness, syncope, speech difficulty, weakness, light-headedness, numbness and headaches.  °Hematological:  Does not bruise/bleed easily.  °Psychiatric/Behavioral:  Negative for agitation, behavioral problems,  confusion, hallucinations, self-injury, sleep disturbance and suicidal ideas. The patient is not nervous/anxious.   ° °Immunization History  °Administered Date(s) Administered  ° Influenza Split 04/30/2015, 04/20/2016, 04/27/2018, 06/16/2019  ° Influenza, High Dose Seasonal PF 06/28/2017, 06/16/2019, 06/02/2020  ° Influenza-Unspecified 06/19/2021  ° PFIZER(Purple Top)SARS-COV-2 Vaccination 08/12/2019, 09/02/2019, 05/01/2020, 11/13/2020, 04/12/2021  ° Pneumococcal Conjugate-13 09/30/2013  ° Pneumococcal Polysaccharide-23 07/27/2003, 09/24/2010  ° Td 04/26/2001, 12/25/2017  ° Tdap 09/24/2010  ° Zoster Recombinat (Shingrix) 11/16/2017, 01/06/2018  ° Zoster, Live 12/11/2006, 04/27/2018, 06/29/2018  ° °Pertinent  Health Maintenance Due  °Topic Date Due  ° INFLUENZA VACCINE  Completed  ° DEXA SCAN  Completed  ° °Fall Risk 03/23/2021 04/23/2021 07/24/2021 08/15/2021 08/15/2021  °Falls in the past year? - 0 - - -  °Was there an injury with Fall? - 0 - - -  °Fall Risk Category Calculator - 0 - - -  °Fall Risk Category - Low - - -  °Patient Fall Risk Level Low fall risk Low fall risk Low fall risk High fall risk High fall risk  °Patient Fall Risk Level - - - sedation today -  °Patient at Risk for Falls Due to - No Fall Risks - - -  °  Fall risk Follow up - Falls evaluation completed - - -   Functional Status Survey:    Vitals:   08/15/21 1521  BP: 100/60  Resp: 16  Temp: (!) 97.1 F (36.2 C)  Weight: 130 lb (59 kg)  Height: 5' 7" (1.702 m)   Body mass index is 20.36 kg/m. Physical Exam Vitals reviewed.  Constitutional:      General: She is not in acute distress.    Appearance: Normal appearance. She is normal weight. She is not ill-appearing or diaphoretic.  HENT:     Head: Normocephalic.     Right Ear: Tympanic membrane, ear canal and external ear normal. There is no impacted cerumen.     Left Ear: Tympanic membrane, ear canal and external ear normal. There is no impacted cerumen.     Nose: Nose normal. No  congestion or rhinorrhea.     Mouth/Throat:     Mouth: Mucous membranes are moist.     Pharynx: Oropharynx is clear. No oropharyngeal exudate or posterior oropharyngeal erythema.  Eyes:     General: No scleral icterus.       Right eye: No discharge.        Left eye: No discharge.     Extraocular Movements: Extraocular movements intact.     Conjunctiva/sclera: Conjunctivae normal.     Pupils: Pupils are equal, round, and reactive to light.  Neck:     Vascular: No carotid bruit.  Cardiovascular:     Rate and Rhythm: Normal rate and regular rhythm.     Pulses: Normal pulses.     Heart sounds: Normal heart sounds. No murmur heard.   No friction rub. No gallop.  Pulmonary:     Effort: Pulmonary effort is normal. No respiratory distress.     Breath sounds: Normal breath sounds. No wheezing, rhonchi or rales.  Chest:     Chest wall: No tenderness.  Abdominal:     General: Bowel sounds are normal. There is no distension.     Palpations: Abdomen is soft. There is no mass.     Tenderness: There is no abdominal tenderness. There is no right CVA tenderness, left CVA tenderness, guarding or rebound.  Musculoskeletal:        General: No swelling or tenderness. Normal range of motion.     Cervical back: Normal range of motion. No rigidity or tenderness.     Right lower leg: No edema.     Left lower leg: No edema.  Lymphadenopathy:     Cervical: No cervical adenopathy.  Skin:    General: Skin is warm and dry.     Coloration: Skin is not pale.     Findings: No bruising, erythema, lesion or rash.  Neurological:     Mental Status: She is alert and oriented to person, place, and time.     Cranial Nerves: No cranial nerve deficit.     Sensory: No sensory deficit.     Motor: No weakness.     Coordination: Coordination normal.     Gait: Gait normal.  Psychiatric:        Mood and Affect: Mood normal.        Speech: Speech normal.        Behavior: Behavior normal.        Thought Content:  Thought content normal.        Judgment: Judgment normal.    Labs reviewed: Recent Labs    12/03/20 1153 03/26/21 1221 08/13/21 0955  NA 141 141  144  °K 4.2 4.5 4.3  °CL 106 102 105  °CO2 29 27 27  °GLUCOSE 77 67 73  °BUN 19 23 26  °CREATININE 0.73 0.73 0.86  °CALCIUM 9.1 9.5 9.4  ° °Recent Labs  °  12/03/20 °1153 07/25/21 °0907  °AST 32 45*  °ALT 21 31  °ALKPHOS  --  54  °BILITOT 0.8 0.5  °PROT 6.0* 5.8*  °ALBUMIN  --  4.5  ° °Recent Labs  °  12/03/20 °1153 03/26/21 °1221 08/13/21 °0955  °WBC 3.8 5.6 2.9*  °NEUTROABS 1,516  --   --   °HGB 13.7 15.0 14.5  °HCT 40.8 42.7 40.7  °MCV 97.8 96 97  °PLT 168 157 137*  ° °Lab Results  °Component Value Date  ° TSH 2.90 12/03/2020  ° °No results found for: HGBA1C °Lab Results  °Component Value Date  ° CHOL 127 07/25/2021  ° HDL 54 07/25/2021  ° LDLCALC 62 07/25/2021  ° TRIG 46 07/25/2021  ° CHOLHDL 2.4 07/25/2021  ° ° °Significant Diagnostic Results in last 30 days:  °No results found. ° °Assessment/Plan °1. Neutropenia, unspecified type (HCC) °Afebrile °Recent WBC 2.9 previous WBC were within normal range  °No acute illness or new medication. °- will recheck CBC then refer to Hematologist if still low. °- CBC with Differential/Platelets °- CMP with eGFR([facility]) ° °2. Thrombocytopenia (HCC) °PLts 137 previous 157  °- reports no easily bleeding. °No petechiae on exam  °- CBC with Differential/Platelets ° °3. Persistent atrial fibrillation (HCC) °Status post cardioversion °HR controlled  °Continue on  Eliquis  °Inquires Eliquis verse Xarelto due to price.  °Eliquis samples given  °- CMP with eGFR([facility]) ° °Family/ staff Communication: Reviewed plan of care with patient verbalized understanding  ° °Labs/tests ordered:  °- CBC with Differential/Platelets °- CMP with eGFR([facility]) ° °Next Appointment: As needed if symptoms worsen or fail to improve  ° ° ° C , NP °

## 2021-08-16 ENCOUNTER — Encounter (HOSPITAL_COMMUNITY): Payer: Self-pay | Admitting: Internal Medicine

## 2021-08-16 LAB — COMPLETE METABOLIC PANEL WITH GFR
AG Ratio: 2.6 (calc) — ABNORMAL HIGH (ref 1.0–2.5)
ALT: 100 U/L — ABNORMAL HIGH (ref 6–29)
AST: 136 U/L — ABNORMAL HIGH (ref 10–35)
Albumin: 4.2 g/dL (ref 3.6–5.1)
Alkaline phosphatase (APISO): 51 U/L (ref 37–153)
BUN/Creatinine Ratio: 37 (calc) — ABNORMAL HIGH (ref 6–22)
BUN: 29 mg/dL — ABNORMAL HIGH (ref 7–25)
CO2: 33 mmol/L — ABNORMAL HIGH (ref 20–32)
Calcium: 9.2 mg/dL (ref 8.6–10.4)
Chloride: 106 mmol/L (ref 98–110)
Creat: 0.78 mg/dL (ref 0.60–1.00)
Globulin: 1.6 g/dL (calc) — ABNORMAL LOW (ref 1.9–3.7)
Glucose, Bld: 90 mg/dL (ref 65–99)
Potassium: 4.5 mmol/L (ref 3.5–5.3)
Sodium: 141 mmol/L (ref 135–146)
Total Bilirubin: 0.6 mg/dL (ref 0.2–1.2)
Total Protein: 5.8 g/dL — ABNORMAL LOW (ref 6.1–8.1)
eGFR: 77 mL/min/{1.73_m2} (ref >=60)

## 2021-08-16 LAB — CBC WITH DIFFERENTIAL/PLATELET
Absolute Monocytes: 564 cells/uL (ref 200–950)
Basophils Absolute: 20 cells/uL (ref 0–200)
Basophils Relative: 0.5 %
Eosinophils Absolute: 72 cells/uL (ref 15–500)
Eosinophils Relative: 1.8 %
HCT: 40.4 % (ref 35.0–45.0)
Hemoglobin: 13.7 g/dL (ref 11.7–15.5)
Lymphs Abs: 1444 cells/uL (ref 850–3900)
MCH: 33.6 pg — ABNORMAL HIGH (ref 27.0–33.0)
MCHC: 33.9 g/dL (ref 32.0–36.0)
MCV: 99 fL (ref 80.0–100.0)
MPV: 11.3 fL (ref 7.5–12.5)
Monocytes Relative: 14.1 %
Neutro Abs: 1900 cells/uL (ref 1500–7800)
Neutrophils Relative %: 47.5 %
Platelets: 132 10*3/uL — ABNORMAL LOW (ref 140–400)
RBC: 4.08 10*6/uL (ref 3.80–5.10)
RDW: 11.3 % (ref 11.0–15.0)
Total Lymphocyte: 36.1 %
WBC: 4 10*3/uL (ref 3.8–10.8)

## 2021-08-26 ENCOUNTER — Other Ambulatory Visit: Payer: Self-pay

## 2021-08-26 ENCOUNTER — Telehealth: Payer: Self-pay | Admitting: Family

## 2021-08-26 ENCOUNTER — Ambulatory Visit (HOSPITAL_COMMUNITY)
Admission: RE | Admit: 2021-08-26 | Discharge: 2021-08-26 | Disposition: A | Discharge status: Home / Self Care | Payer: PPO | Source: Ambulatory Visit | Attending: Physician Assistant | Admitting: Physician Assistant

## 2021-08-26 VITALS — BP 92/54 | HR 47 | Ht 67.0 in | Wt 128.2 lb

## 2021-08-26 DIAGNOSIS — Z79899 Other long term (current) drug therapy: Secondary | ICD-10-CM | POA: Insufficient documentation

## 2021-08-26 DIAGNOSIS — R0602 Shortness of breath: Secondary | ICD-10-CM | POA: Diagnosis not present

## 2021-08-26 DIAGNOSIS — E785 Hyperlipidemia, unspecified: Secondary | ICD-10-CM | POA: Diagnosis not present

## 2021-08-26 DIAGNOSIS — I2510 Atherosclerotic heart disease of native coronary artery without angina pectoris: Secondary | ICD-10-CM | POA: Insufficient documentation

## 2021-08-26 DIAGNOSIS — D6869 Other thrombophilia: Secondary | ICD-10-CM

## 2021-08-26 DIAGNOSIS — I4819 Other persistent atrial fibrillation: Secondary | ICD-10-CM | POA: Diagnosis not present

## 2021-08-26 DIAGNOSIS — D696 Thrombocytopenia, unspecified: Secondary | ICD-10-CM

## 2021-08-26 DIAGNOSIS — Z7901 Long term (current) use of anticoagulants: Secondary | ICD-10-CM | POA: Insufficient documentation

## 2021-08-26 NOTE — Progress Notes (Signed)
Primary Care Physician: Sandrea Hughs, NP Primary Cardiologist: Dr Stanford Breed Primary Electrophysiologist: Dr Curt Bears Referring Physician: Antonieta Pert triage    Shelia [name] is a 81 y.o. female with a history of HLD, CAD, atrial fibrillation who presents for follow up in the [facility].  The patient was initially diagnosed with atrial fibrillation early 2021 and underwent DCCV on 11/17/19. She had recurrent symptoms of persistent afib and had repeat DCCV on 04/16/21. Patient is on Eliquis for a CHADS2VASC score of 4. She presented to urgent care 07/24/21 with palpitations and mild SOB. ECG showed rate controlled afib. There were no specific triggers that she could identify. She denies any bleeding issues on anticoagulation.   On follow up today, patient is s/p DCCV on 08/15/21. She is in SR today. She now has a Kardia mobile to monitor her afib burden. She denies any bleeding issues on anticoagulation.   Today, she denies symptoms of palpitations, chest pain, orthopnea, PND, lower extremity edema, dizziness, presyncope, syncope, snoring, daytime somnolence, bleeding, or neurologic sequela. The patient is tolerating medications without difficulties and is otherwise without complaint today.    Atrial Fibrillation Risk Factors:  she does not have symptoms or diagnosis of sleep apnea. she does not have a history of rheumatic fever. she does not have a history of alcohol use.   she has a BMI of Body mass index is 20.08 kg/m.Marland Kitchen Filed Weights   08/26/21 1002  Weight: 58.2 kg     Family History  Problem Relation Age of Onset   Hypertension Mother    Cancer Father    Diabetes Sister    Anxiety disorder Daughter    Breast cancer Neg Hx      Atrial Fibrillation Management history:  Previous antiarrhythmic drugs: none Previous cardioversions: 11/17/19, 04/16/21 Previous ablations: none CHADS2VASC score: 4 Anticoagulation history: Eliquis   Past  Medical History:  Diagnosis Date   Anxiety    Arthritis    "left knee; left hip; lower back; mild to moderate in right hip" (09/30/2016)   Atrial fibrillation (HCC)    Chronic lower back pain    Gastritis    Heart palpitations    History of bone density study 2018   History of mammogram 2021   History of MRI 2018   History of Papanicolaou smear of cervix    Hypercholesteremia    Osteopenia    Seasonal allergies    Past Surgical History:  Procedure Laterality Date   CARDIOVERSION N/A 11/17/2019   Procedure: CARDIOVERSION;  Surgeon: [name], MD;  Location: [place];  Service: Cardiovascular;  Laterality: N/A;   CARDIOVERSION N/A 04/16/2021   Procedure: CARDIOVERSION;  Surgeon: Skeet Latch, MD;  Location: [place];  Service: Cardiovascular;  Laterality: N/A;   CARDIOVERSION N/A 08/15/2021   Procedure: CARDIOVERSION;  Surgeon: Janina Mayo, MD;  Location: [place];  Service: Cardiovascular;  Laterality: N/A;   COLONOSCOPY     DILATION AND CURETTAGE OF UTERUS     JOINT REPLACEMENT     TONSILLECTOMY     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   Procedure: TOTAL HIP ARTHROPLASTY ANTERIOR APPROACH;  Surgeon: Dorna Leitz, MD;  Location: [place];  Service: Orthopedics;  Laterality: Left;    Current Outpatient Medications  Medication Sig Dispense Refill   amoxicillin (AMOXIL) 500 MG capsule Take 2,000 mg by mouth See admin instructions. Prior to dental procedures     Brimonidine Tartrate (LUMIFY) 0.025 % SOLN Place 1 drop into both  eyes daily as needed (red eyes).     Carboxymeth-Glyc-Polysorb PF (REFRESH OPTIVE MEGA-3) 0.5-1-0.5 % SOLN Place 1 drop into both eyes 4 (four) times daily as needed (dry eyes).     conjugated estrogens (PREMARIN) vaginal cream Place 1 Applicatorful vaginally every Wednesday.     ELIQUIS 5 MG TABS tablet TAKE 1 TABLET(5 MG) BY MOUTH TWICE DAILY 180 tablet 1   ketotifen (ZADITOR) 0.025 % ophthalmic solution Place 1 drop into both eyes 2  (two) times daily as needed (itchy eyes).     loratadine (CLARITIN) 10 MG tablet Take 10 mg by mouth daily as needed for allergies.     melatonin 5 MG TABS Take 5 mg by mouth at bedtime.     NON FORMULARY Take 2 tablets by mouth daily. BONE-UP     Omega-3 Fatty Acids (FISH OIL PO) Take 3 capsules by mouth daily.     rosuvastatin (CRESTOR) 20 MG tablet Take 1 tablet (20 mg total) by mouth daily. 90 tablet 1   Simethicone (GAS-X PO) Take 1 capsule by mouth as needed (gas).     Varenicline Tartrate (TYRVAYA) 0.03 MG/ACT SOLN Place 1 spray into both nostrils in the morning and at bedtime. (Patient not taking: Reported on 08/26/2021)     No current facility-administered medications for this encounter.    Allergies  Allergen Reactions   Nsaids Other (See Comments)    Caused painful stomach problems   Prednisone Other (See Comments)    Caused painful stomach problems    Social History   Socioeconomic History   Marital status: Married    Spouse name: Not on file   Number of children: Not on file   Years of education: Not on file   Highest education level: Not on file  Occupational History   Not on file  Tobacco Use   Smoking status: Former    Packs/day: 2.00    Types: Cigarettes   Smokeless tobacco: Never   Tobacco comments:    "quit smoking mid-late 1980s"  Vaping Use   Vaping Use: Never used  Substance and Sexual Activity   Alcohol use: Not Currently    Comment: 3 drinks weekly   Drug use: Never   Sexual activity: Yes  Other Topics Concern   Not on file  Social History Narrative   Tobacco use, amount per day now: None.   Past tobacco use, amount per day: Maximum 2 packs    How many years did you use tobacco: Quit 1985   Alcohol use (drinks per week): 3   Diet: Vegetables, Chicken, Fish, Grains, and Fruit.   Do you drink/eat things with caffeine: Coffee   Marital status: Married                                  What year were you married? 1969   Do you live in a house,  apartment, assisted living, condo, trailer, etc.? House.   Is it one or more stories? 2 stories.   How many persons live in your home? 2   Do you have pets in your home?( please list) No.   Highest Level of education completed? Masters in Adult Educations.   Current or past profession: Regulatory affairs officer    Do you exercise? Yes.  Type and how often? Floor excercises 5 times week. Walk 2 miles 6 times week.   Do you have a living will? Yes   Do you have a DNR form?  Yes                                 If not, do you want to discuss one?   Do you have signed POA/HPOA forms?  Yes                      If so, please bring to you appointment      Do you have any difficulty bathing or dressing yourself? No   Do you have any difficulty preparing food or eating? No   Do you have any difficulty managing your medications? No   Do you have any difficulty managing your finances? No   Do you have any difficulty affording your medications? No   Social Determinants of Radio broadcast assistant Strain: Not on file  Food Insecurity: Not on file  Transportation Needs: Not on file  Physical Activity: Not on file  Stress: Not on file  Social Connections: Not on file  Intimate Partner Violence: Not on file     ROS- All systems are reviewed and negative except as per the HPI above.  Physical Exam: Vitals:   08/26/21 1002  BP: (!) 92/54  Pulse: (!) 47  Weight: 58.2 kg  Height: 5\' 7"  (1.702 m)    GEN- The patient is a well appearing elderly female, alert and oriented x 3 today.   HEENT-head normocephalic, atraumatic, sclera clear, conjunctiva pink, hearing intact, trachea midline. Lungs- Clear to ausculation bilaterally, normal work of breathing Heart- Regular rate and rhythm, bradycardia, no murmurs, rubs or gallops  GI- soft, NT, ND, + BS Extremities- no clubbing, cyanosis, or edema MS- no significant deformity or atrophy Skin- no rash or lesion Psych-  euthymic mood, full affect Neuro- strength and sensation are intact   Wt Readings from Last 3 Encounters:  08/26/21 58.2 kg  08/15/21 59 kg  08/15/21 57.6 kg    EKG today demonstrates  SB Vent. rate 47 BPM PR interval 150 ms QRS duration 84 ms QT/QTcB 428/378 ms  Echo 10/31/19 demonstrated   1. Left ventricular ejection fraction, by estimation, is 60 to 65%. The  left ventricle has normal function. The left ventricle has no regional  wall motion abnormalities. Left ventricular diastolic parameters are  indeterminate.   2. Right ventricular systolic function is normal. The right ventricular  size is normal. There is normal pulmonary artery systolic pressure.   3. Left atrial size was moderately dilated.   4. The mitral valve is normal in structure. Mild mitral valve  regurgitation. No evidence of mitral stenosis.   5. The aortic valve is normal in structure. Aortic valve regurgitation is  trivial. No aortic stenosis is present.   6. The inferior vena cava is normal in size with greater than 50%  respiratory variability, suggesting right atrial pressure of 3 mmHg.   Epic records are reviewed at length today  CHA2DS2-VASc Score = 4  The patient's score is based upon: CHF History: 0 HTN History: 0 Diabetes History: 0 Stroke History: 0 Vascular Disease History: 1 Age Score: 2 Gender Score: 1       ASSESSMENT AND PLAN: 1. Persistent Atrial Fibrillation (ICD10:  I48.19) The patient's CHA2DS2-VASc score is 4, indicating  a 4.8% annual risk of stroke.   We discussed rhythm control options today including AAD (dofetilide, amiodarone) vs ablation. She did become bradycardic on beta blockers in the past, flecainide may not be best option with CAD.  She would like to continue her present therapy for now and monitor her afib burden on her Jodelle Red. If she does have quick return of her afib, she would favor ablation over AAD. Continue Eliquis 5 mg BID  2. Secondary Hypercoagulable  State (ICD10:  D68.69) The patient is at significant risk for stroke/thromboembolism based upon her CHA2DS2-VASc Score of 4.  Continue Apixaban (Eliquis).   3. CAD Coronary calcium score 192 (63rd percentile) FFR negative for significant stenosis No anginal symptoms.   Follow up with Dr Curt Bears per recall and Dr Stanford Breed as scheduled.    [facility] [address] [place], [place] [address] [phone] 08/26/2021 5:01 PM

## 2021-08-26 NOTE — Telephone Encounter (Signed)
Pt called stating that she would like to move forward with getting a hematology referral sent over to [facility] as she discussed with Dinah at her previous visit.  Please advise Lattie Haw

## 2021-08-26 NOTE — Telephone Encounter (Signed)
Hematologist referral ordered

## 2021-08-27 ENCOUNTER — Telehealth: Payer: PPO | Admitting: Family

## 2021-08-27 ENCOUNTER — Ambulatory Visit (HOSPITAL_COMMUNITY): Payer: PPO | Attending: Cardiology

## 2021-08-27 DIAGNOSIS — I4819 Other persistent atrial fibrillation: Secondary | ICD-10-CM | POA: Insufficient documentation

## 2021-08-27 LAB — ECHOCARDIOGRAM COMPLETE
Area-P 1/2: 3 cm2
P 1/2 time: 753 msec
S' Lateral: 2.4 cm

## 2021-08-27 NOTE — Telephone Encounter (Signed)
Noted  

## 2021-08-28 ENCOUNTER — Other Ambulatory Visit (HOSPITAL_COMMUNITY): Payer: Self-pay

## 2021-08-28 ENCOUNTER — Telehealth: Payer: Self-pay | Admitting: Hematology and Oncology

## 2021-08-28 NOTE — Telephone Encounter (Signed)
Scheduled appt per 1/30 referral. Spoke to pt who is aware of appt date and time. Pt is aware to arrive 15 mins prior to appt time.  °

## 2021-08-29 ENCOUNTER — Other Ambulatory Visit (HOSPITAL_COMMUNITY): Payer: Self-pay

## 2021-08-30 ENCOUNTER — Other Ambulatory Visit (HOSPITAL_COMMUNITY): Payer: Self-pay

## 2021-08-31 ENCOUNTER — Other Ambulatory Visit (HOSPITAL_COMMUNITY): Payer: Self-pay

## 2021-09-13 ENCOUNTER — Inpatient Hospital Stay: Payer: PPO | Attending: Hematology and Oncology

## 2021-09-13 ENCOUNTER — Encounter: Payer: Self-pay | Admitting: Hematology and Oncology

## 2021-09-13 ENCOUNTER — Other Ambulatory Visit: Payer: Self-pay

## 2021-09-13 ENCOUNTER — Inpatient Hospital Stay (HOSPITAL_BASED_OUTPATIENT_CLINIC_OR_DEPARTMENT_OTHER): Payer: PPO | Admitting: Hematology and Oncology

## 2021-09-13 VITALS — BP 123/75 | HR 65 | Resp 18 | Ht 67.0 in | Wt 130.0 lb

## 2021-09-13 DIAGNOSIS — D72819 Decreased white blood cell count, unspecified: Secondary | ICD-10-CM | POA: Diagnosis not present

## 2021-09-13 DIAGNOSIS — Z801 Family history of malignant neoplasm of trachea, bronchus and lung: Secondary | ICD-10-CM

## 2021-09-13 DIAGNOSIS — R748 Abnormal levels of other serum enzymes: Secondary | ICD-10-CM | POA: Diagnosis not present

## 2021-09-13 DIAGNOSIS — Z87891 Personal history of nicotine dependence: Secondary | ICD-10-CM

## 2021-09-13 DIAGNOSIS — Z7901 Long term (current) use of anticoagulants: Secondary | ICD-10-CM | POA: Insufficient documentation

## 2021-09-13 DIAGNOSIS — D696 Thrombocytopenia, unspecified: Secondary | ICD-10-CM | POA: Insufficient documentation

## 2021-09-13 DIAGNOSIS — F1021 Alcohol dependence, in remission: Secondary | ICD-10-CM

## 2021-09-13 DIAGNOSIS — I480 Paroxysmal atrial fibrillation: Secondary | ICD-10-CM

## 2021-09-13 LAB — CBC WITH DIFFERENTIAL (CANCER CENTER ONLY)
Abs Immature Granulocytes: 0.01 10*3/uL (ref 0.00–0.07)
Basophils Absolute: 0 10*3/uL (ref 0.0–0.1)
Basophils Relative: 1 %
Eosinophils Absolute: 0.1 10*3/uL (ref 0.0–0.5)
Eosinophils Relative: 2 %
HCT: 42.1 % (ref 36.0–46.0)
Hemoglobin: 14.1 g/dL (ref 12.0–15.0)
Immature Granulocytes: 0 %
Lymphocytes Relative: 33 %
Lymphs Abs: 1.6 10*3/uL (ref 0.7–4.0)
MCH: 33.4 pg (ref 26.0–34.0)
MCHC: 33.5 g/dL (ref 30.0–36.0)
MCV: 99.8 fL (ref 80.0–100.0)
Monocytes Absolute: 0.5 10*3/uL (ref 0.1–1.0)
Monocytes Relative: 11 %
Neutro Abs: 2.5 10*3/uL (ref 1.7–7.7)
Neutrophils Relative %: 53 %
Platelet Count: 142 10*3/uL — ABNORMAL LOW (ref 150–400)
RBC: 4.22 MIL/uL (ref 3.87–5.11)
RDW: 11.5 % (ref 11.5–15.5)
Smear Review: NORMAL
WBC Count: 4.7 10*3/uL (ref 4.0–10.5)
nRBC: 0 % (ref 0.0–0.2)

## 2021-09-13 LAB — CMP (CANCER CENTER ONLY)
ALT: 33 U/L (ref 0–44)
AST: 44 U/L — ABNORMAL HIGH (ref 15–41)
Albumin: 4.4 g/dL (ref 3.5–5.0)
Alkaline Phosphatase: 48 U/L (ref 38–126)
Anion gap: 4 — ABNORMAL LOW (ref 5–15)
BUN: 25 mg/dL — ABNORMAL HIGH (ref 8–23)
CO2: 33 mmol/L — ABNORMAL HIGH (ref 22–32)
Calcium: 9.6 mg/dL (ref 8.9–10.3)
Chloride: 105 mmol/L (ref 98–111)
Creatinine: 0.82 mg/dL (ref 0.44–1.00)
GFR, Estimated: >60 mL/min (ref >60)
Glucose, Bld: 75 mg/dL (ref 70–99)
Potassium: 3.8 mmol/L (ref 3.5–5.1)
Sodium: 142 mmol/L (ref 135–145)
Total Bilirubin: 0.8 mg/dL (ref 0.3–1.2)
Total Protein: 6.6 g/dL (ref 6.5–8.1)

## 2021-09-13 LAB — VITAMIN B12: Vitamin B-12: 409 pg/mL (ref 180–914)

## 2021-09-13 LAB — SAVE SMEAR(SSMR), FOR PROVIDER SLIDE REVIEW

## 2021-09-13 NOTE — Assessment & Plan Note (Signed)
She has mild intermittent elevated liver enzymes She is taking Crestor Observe closely As above, she might need abdominal imaging to rule out liver cirrhosis given history of alcoholism

## 2021-09-13 NOTE — Progress Notes (Signed)
Shelia [name] CONSULT NOTE  Patient Care Team: [name], Nelda Bucks, NP as PCP - General (Family Medicine) [name], MD as PCP - Cardiology (Cardiology) Constance Haw, MD as PCP - Electrophysiology (Cardiology) Molli Posey, MD as Consulting Physician (Obstetrics and Gynecology) [name], MD as Consulting Physician (Cardiology) Stephannie Li, OD (Ophthalmology)  ASSESSMENT & PLAN Thrombocytopenia [facility]) The cause is unknown My best guess is she might have liver disease given history of chronic alcohol use I will order additional work-up We will call her with test results If blood test is unrevealing, she might need abdominal imaging to look for liver cirrhosis plus minus splenomegaly  Leukopenia She has intermittent leukopenia for the past 5 years, cause unknown This has resolved Observe only   Elevated liver enzymes She has mild intermittent elevated liver enzymes She is taking Crestor Observe closely As above, she might need abdominal imaging to rule out liver cirrhosis given history of alcoholism  Paroxysmal A-fib ([place]) There is no contraindication to remain on antiplatelet agents or anticoagulants as long as the platelet is greater than 50,000.    Orders Placed This Encounter  Procedures   Vitamin B12    Standing Status:   Future    Number of Occurrences:   1    Standing Expiration Date:   09/13/2022   CBC with Differential ([place] Only)    Standing Status:   Future    Number of Occurrences:   1    Standing Expiration Date:   09/13/2022   CMP ([place] only)    Standing Status:   Future    Number of Occurrences:   1    Standing Expiration Date:   09/13/2022   [facility])    Standing Status:   Future    Number of Occurrences:   1    Standing Expiration Date:   09/13/2022   All questions were answered. The patient knows to call the clinic with any problems, questions or concerns. No barriers to learning was  detected. The total time spent in the appointment was 55 minutes encounter with patients including review of chart and various tests results, discussions about plan of care and coordination of care plan  Heath Lark, MD 09/13/2021 2:19 PM  CHIEF COMPLAINTS/PURPOSE OF CONSULTATION:  Leukopenia and thrombocytopenia  HISTORY OF PRESENTING ILLNESS:  Shelia [name] 81 y.o. female is here because of recent abnormal findings of CBC I have the opportunity to review her CBC dated back to 2018 On 09/22/2016, her white blood cell count was 3.6, hemoglobin 13.5 and platelet count of 198 Most recently, the patient was admitted for cardioversion and was noted to have leukopenia and thrombocytopenia She was also noted to have grossly abnormal LFTs Overall, over the last 5 years, her white blood cell count ranged from 2.9-7.3 and platelet count ranged from 132-1 98 The patient have history of alcoholism with regular drinks at least 3 to 5 days a week for over 30 years She quit drinking altogether since August 2018 She used to smoke but quit smoking for many years She is on chronic anticoagulation therapy and denies recent bleeding She had received blood transfusion when she had hip surgery in the past No recent infection.  Her last antibiotic therapy was more than 6 months  MEDICAL HISTORY:  Past Medical History:  Diagnosis Date   Anxiety    Arthritis    "left knee; left hip; lower back; mild to moderate in right hip" (  09/30/2016)   Atrial fibrillation (HCC)    Chronic lower back pain    Gastritis    Heart palpitations    History of bone density study 2018   History of mammogram 2021   History of MRI 2018   History of Papanicolaou smear of cervix    Hypercholesteremia    Osteopenia    Seasonal allergies     SURGICAL HISTORY: Past Surgical History:  Procedure Laterality Date   CARDIOVERSION N/A 11/17/2019   Procedure: CARDIOVERSION;  Surgeon: [name], MD;  Location: [place];  Service: Cardiovascular;  Laterality: N/A;   CARDIOVERSION N/A 04/16/2021   Procedure: CARDIOVERSION;  Surgeon: Skeet Latch, MD;  Location: [place];  Service: Cardiovascular;  Laterality: N/A;   CARDIOVERSION N/A 08/15/2021   Procedure: CARDIOVERSION;  Surgeon: Janina Mayo, MD;  Location: [facility] ENDOSCOPY;  Service: Cardiovascular;  Laterality: N/A;   COLONOSCOPY     DILATION AND CURETTAGE OF UTERUS     JOINT REPLACEMENT     TONSILLECTOMY     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   Procedure: TOTAL HIP ARTHROPLASTY ANTERIOR APPROACH;  Surgeon: Dorna Leitz, MD;  Location: [place];  Service: Orthopedics;  Laterality: Left;    SOCIAL HISTORY: Social History   Socioeconomic History   Marital status: Married    Spouse name: Not on file   Number of children: 3   Years of education: Not on file   Highest education level: Not on file  Occupational History   Not on file  Tobacco Use   Smoking status: Former    Packs/day: 2.00    Types: Cigarettes   Smokeless tobacco: Never   Tobacco comments:    "quit smoking mid-late 1980s"  Vaping Use   Vaping Use: Never used  Substance and Sexual Activity   Alcohol use: Not Currently    Comment: 3 drinks weekly   Drug use: Never   Sexual activity: Yes  Other Topics Concern   Not on file  Social History Narrative   Tobacco use, amount per day now: None.   Past tobacco use, amount per day: Maximum 2 packs    How many years did you use tobacco: Quit 1985   Alcohol use (drinks per week): 3   Diet: Vegetables, Chicken, Fish, Grains, and Fruit.   Do you drink/eat things with caffeine: Coffee   Marital status: Married                                  What year were you married? 1969   Do you live in a house, apartment, assisted living, condo, trailer, etc.? House.   Is it one or more stories? 2 stories.   How many persons live in your home? 2   Do you have pets in your home?( please list) No.   Highest Level of education  completed? Masters in Adult Educations.   Current or past profession: Regulatory affairs officer    Do you exercise? Yes.                                  Type and how often? Floor excercises 5 times week. Walk 2 miles 6 times week.   Do you have a living will? Yes   Do you have a DNR form?  Yes  If not, do you want to discuss one?   Do you have signed POA/HPOA forms?  Yes                      If so, please bring to you appointment      Do you have any difficulty bathing or dressing yourself? No   Do you have any difficulty preparing food or eating? No   Do you have any difficulty managing your medications? No   Do you have any difficulty managing your finances? No   Do you have any difficulty affording your medications? No   Social Determinants of Radio broadcast assistant Strain: Not on file  Food Insecurity: Not on file  Transportation Needs: Not on file  Physical Activity: Not on file  Stress: Not on file  Social Connections: Not on file  Intimate Partner Violence: Not on file    FAMILY HISTORY: Family History  Problem Relation Age of Onset   Hypertension Mother    Cancer Father        lung cancer   Diabetes Sister    Anxiety disorder Daughter    Breast cancer Neg Hx     ALLERGIES:  is allergic to nsaids and prednisone.  MEDICATIONS:  Current Outpatient Medications  Medication Sig Dispense Refill   amoxicillin (AMOXIL) 500 MG capsule Take 2,000 mg by mouth See admin instructions. Prior to dental procedures     apixaban (ELIQUIS) 5 MG TABS tablet TAKE 1 TABLET(5 MG) BY MOUTH TWICE DAILY 180 tablet 1   Brimonidine Tartrate (LUMIFY) 0.025 % SOLN Place 1 drop into both eyes daily as needed (red eyes).     Carboxymeth-Glyc-Polysorb PF (REFRESH OPTIVE MEGA-3) 0.5-1-0.5 % SOLN Place 1 drop into both eyes 4 (four) times daily as needed (dry eyes).     conjugated estrogens (PREMARIN) vaginal cream Place 1 Applicatorful vaginally every Wednesday.      ketotifen (ZADITOR) 0.025 % ophthalmic solution Place 1 drop into both eyes 2 (two) times daily as needed (itchy eyes).     loratadine (CLARITIN) 10 MG tablet Take 10 mg by mouth daily as needed for allergies.     melatonin 5 MG TABS Take 5 mg by mouth at bedtime.     NON FORMULARY Take 2 tablets by mouth daily. BONE-UP     Omega-3 Fatty Acids (FISH OIL PO) Take 3 capsules by mouth daily.     rosuvastatin (CRESTOR) 20 MG tablet Take 1 tablet (20 mg total) by mouth daily. 90 tablet 1   Simethicone (GAS-X PO) Take 1 capsule by mouth as needed (gas).     No current facility-administered medications for this visit.    REVIEW OF SYSTEMS:   Constitutional: Denies fevers, chills or abnormal night sweats Eyes: Denies blurriness of vision, double vision or watery eyes Ears, nose, mouth, throat, and face: Denies mucositis or sore throat Respiratory: Denies cough, dyspnea or wheezes Cardiovascular: Denies palpitation, chest discomfort or lower extremity swelling Gastrointestinal:  Denies nausea, heartburn or change in bowel habits Skin: Denies abnormal skin rashes Lymphatics: Denies new lymphadenopathy or easy bruising Neurological:Denies numbness, tingling or new weaknesses Behavioral/Psych: Mood is stable, no new changes  All other systems were reviewed with the patient and are negative.  PHYSICAL EXAMINATION: ECOG PERFORMANCE STATUS: 0 - Asymptomatic  Vitals:   09/13/21 1257  BP: 123/75  Pulse: 65  Resp: 18  SpO2: 100%   Filed Weights   09/13/21 1257  Weight: 130 lb (59 kg)  GENERAL:alert, no distress and comfortable SKIN: skin color, texture, turgor are normal, no rashes or significant lesions EYES: normal, conjunctiva are pink and non-injected, sclera clear OROPHARYNX:no exudate, no erythema and lips, buccal mucosa, and tongue normal  NECK: supple, thyroid normal size, non-tender, without nodularity LYMPH:  no palpable lymphadenopathy in the cervical, axillary or  inguinal LUNGS: clear to auscultation and percussion with normal breathing effort HEART: regular rate & rhythm and no murmurs and no lower extremity edema ABDOMEN:abdomen soft, non-tender and normal bowel sounds Musculoskeletal:no cyanosis of digits and no clubbing  PSYCH: alert & oriented x 3 with fluent speech NEURO: no focal motor/sensory deficits  LABORATORY DATA:  I have reviewed the data as listed Lab Results  Component Value Date   WBC 4.7 09/13/2021   HGB 14.1 09/13/2021   HCT 42.1 09/13/2021   MCV 99.8 09/13/2021   PLT 142 (L) 09/13/2021     RADIOGRAPHIC STUDIES: I have personally reviewed the radiological images as listed and agreed with the findings in the report. ECHOCARDIOGRAM COMPLETE  Result Date: 08/27/2021    ECHOCARDIOGRAM REPORT   Patient Name:   Shelia [name] Date of Exam: 08/27/2021 Medical Rec #:  [id number]           Height:       67.0 in Accession #:    [id number]          Weight:       128.2 lb Date of Birth:  [birth year]           BSA:          1.674 m Patient Age:    31 years            BP:           100/60 mmHg Patient Gender: F                   HR:           52 bpm. Exam Location:  [place] Procedure: 2D Echo, 3D Echo, Cardiac Doppler, Color Doppler and Strain Analysis Indications:    I48.19 Atrial fibrillation  History:        Patient has prior history of Echocardiogram examinations, most                 recent 10/31/2019. CAD, Arrythmias:Atrial Fibrillation; Risk                 Factors:Dyslipidemia and Former Smoker.  Sonographer:    Basilia Jumbo BS, RDCS Referring Phys: [place]  1. Left ventricular ejection fraction, by estimation, is 60 to 65%. The left ventricle has normal function. The left ventricle has no regional wall motion abnormalities. Left ventricular diastolic parameters were normal. The average left ventricular global longitudinal strain is -22.9 %. The global longitudinal strain is normal.  2. Right ventricular  systolic function is normal. The right ventricular size is normal. There is mildly elevated pulmonary artery systolic pressure.  3. Left atrial size was moderately dilated.  4. Right atrial size was moderately dilated.  5. The mitral valve is normal in structure. Mild mitral valve regurgitation. No evidence of mitral stenosis.  6. Tricuspid valve regurgitation is mild to moderate.  7. The aortic valve is tricuspid. Aortic valve regurgitation is mild. No aortic stenosis is present.  8. Pulmonic valve regurgitation is moderate.  9. The inferior vena cava is dilated in size with <50% respiratory variability, suggesting right atrial pressure of 15 mmHg. Comparison(s):  No significant change from prior study. 10/31/19 EF 60-65%. Conclusion(s)/Recommendation(s): Otherwise normal echocardiogram, with minor abnormalities described in the report. FINDINGS  Left Ventricle: Left ventricular ejection fraction, by estimation, is 60 to 65%. The left ventricle has normal function. The left ventricle has no regional wall motion abnormalities. The average left ventricular global longitudinal strain is -22.9 %. The global longitudinal strain is normal. The left ventricular internal cavity size was normal in size. There is no left ventricular hypertrophy. Left ventricular diastolic parameters were normal. Right Ventricle: The right ventricular size is normal. No increase in right ventricular wall thickness. Right ventricular systolic function is normal. There is mildly elevated pulmonary artery systolic pressure. The tricuspid regurgitant velocity is 2.51  m/s, and with an assumed right atrial pressure of 15 mmHg, the estimated right ventricular systolic pressure is 89.1 mmHg. Left Atrium: Left atrial size was moderately dilated. Right Atrium: Right atrial size was moderately dilated. Pericardium: There is no evidence of pericardial effusion. Mitral Valve: The mitral valve is normal in structure. Mild mitral valve regurgitation. No  evidence of mitral valve stenosis. Tricuspid Valve: The tricuspid valve is normal in structure. Tricuspid valve regurgitation is mild to moderate. No evidence of tricuspid stenosis. Aortic Valve: The aortic valve is tricuspid. Aortic valve regurgitation is mild. Aortic regurgitation PHT measures 753 msec. No aortic stenosis is present. Pulmonic Valve: The pulmonic valve was grossly normal. Pulmonic valve regurgitation is moderate. No evidence of pulmonic stenosis. Aorta: The aortic root, ascending aorta, aortic arch and descending aorta are all structurally normal, with no evidence of dilitation or obstruction. Venous: The inferior vena cava is dilated in size with less than 50% respiratory variability, suggesting right atrial pressure of 15 mmHg. IAS/Shunts: The atrial septum is grossly normal.  LEFT VENTRICLE PLAX 2D LVIDd:         4.20 cm   Diastology LVIDs:         2.40 cm   LV e' medial:    11.40 cm/s LV PW:         0.90 cm   LV E/e' medial:  6.5 LV IVS:        0.80 cm   LV e' lateral:   14.80 cm/s LVOT diam:     2.00 cm   LV E/e' lateral: 5.0 LV SV:         66 LV SV Index:   39        2D Longitudinal Strain LVOT Area:     3.14 cm  2D Strain GLS (A2C):   -19.3 %                          2D Strain GLS (A3C):   -26.0 %                          2D Strain GLS (A4C):   -23.3 %                          2D Strain GLS Avg:     -22.9 %                           3D Volume EF:                          3D EF:  63 %                          LV EDV:       91 ml                          LV ESV:       34 ml                          LV SV:        57 ml RIGHT VENTRICLE             IVC RV Basal diam:  3.80 cm     IVC diam: 3.10 cm RV S prime:     17.10 cm/s TAPSE (M-mode): 1.8 cm RVSP:           40.2 mmHg LEFT ATRIUM             Index        RIGHT ATRIUM            Index LA diam:        3.70 cm 2.21 cm/m   RA Pressure: 15.00 mmHg LA Vol (A2C):   65.1 ml 38.89 ml/m  RA Area:     18.80 cm LA Vol (A4C):   62.7 ml 37.46  ml/m  RA Volume:   49.00 ml   29.27 ml/m LA Biplane Vol: 66.8 ml 39.91 ml/m  AORTIC VALVE LVOT Vmax:   88.60 cm/s LVOT Vmean:  54.000 cm/s LVOT VTI:    0.209 m AI PHT:      753 msec  AORTA Ao Root diam: 2.90 cm Ao Asc diam:  3.10 cm MITRAL VALVE               TRICUSPID VALVE                            TR Peak grad:   25.2 mmHg MV Decel Time: 253 msec    TR Vmax:        251.00 cm/s MV E velocity: 73.90 cm/s  Estimated RAP:  15.00 mmHg MV A velocity: 43.00 cm/s  RVSP:           40.2 mmHg MV E/A ratio:  1.72                            SHUNTS                            Systemic VTI:  0.21 m                            Systemic Diam: 2.00 cm Buford Dresser MD Electronically signed by Buford Dresser MD Signature Date/Time: 08/27/2021/4:00:04 PM    Final

## 2021-09-13 NOTE — Assessment & Plan Note (Signed)
The cause is unknown My best guess is she might have liver disease given history of chronic alcohol use I will order additional work-up We will call her with test results If blood test is unrevealing, she might need abdominal imaging to look for liver cirrhosis plus minus splenomegaly

## 2021-09-13 NOTE — Assessment & Plan Note (Signed)
She has intermittent leukopenia for the past 5 years, cause unknown This has resolved Observe only

## 2021-09-13 NOTE — Assessment & Plan Note (Signed)
There is no contraindication to remain on antiplatelet agents or anticoagulants as long as the platelet is greater than 50,000.

## 2021-09-16 ENCOUNTER — Telehealth: Payer: Self-pay | Admitting: Hematology and Oncology

## 2021-09-16 DIAGNOSIS — Z01419 Encounter for gynecological examination (general) (routine) without abnormal findings: Secondary | ICD-10-CM | POA: Diagnosis not present

## 2021-09-16 DIAGNOSIS — Z6820 Body mass index (BMI) 20.0-20.9, adult: Secondary | ICD-10-CM | POA: Diagnosis not present

## 2021-09-16 NOTE — Telephone Encounter (Signed)
I have reviewed her test results from last week She has no signs of recurrent neutropenia Her platelet count is improved B12 level is okay Liver enzymes is still borderline abnormal Overall, I suspect her mild thrombocytopenia could be due to liver disease from history of alcoholism She has stopped drinking since end of last year I recommend close monitoring for now rather than further evaluation such as imaging study of her liver She has upcoming appointment to see her primary care doctor in May I will see how her CBC look like at the time and decide if she needs to come back She expressed understanding I will route the note to her primary care doctor for review

## 2021-10-29 DIAGNOSIS — Z961 Presence of intraocular lens: Secondary | ICD-10-CM | POA: Diagnosis not present

## 2021-10-29 DIAGNOSIS — H16223 Keratoconjunctivitis sicca, not specified as Sjogren's, bilateral: Secondary | ICD-10-CM | POA: Diagnosis not present

## 2021-10-29 DIAGNOSIS — H26491 Other secondary cataract, right eye: Secondary | ICD-10-CM | POA: Diagnosis not present

## 2021-10-29 DIAGNOSIS — H43811 Vitreous degeneration, right eye: Secondary | ICD-10-CM | POA: Diagnosis not present

## 2021-11-07 ENCOUNTER — Encounter (HOSPITAL_COMMUNITY): Payer: Self-pay

## 2021-11-15 ENCOUNTER — Other Ambulatory Visit: Payer: Self-pay | Admitting: Medical

## 2021-11-18 ENCOUNTER — Other Ambulatory Visit (HOSPITAL_COMMUNITY): Payer: Self-pay

## 2021-11-18 MED ORDER — ROSUVASTATIN CALCIUM 20 MG PO TABS
20.0000 mg | ORAL_TABLET | Freq: Every day | ORAL | 1 refills | Status: DC
  Filled 2021-11-18: qty 90, 90d supply, fill #0
  Filled 2022-02-12: qty 90, 90d supply, fill #1

## 2021-11-21 ENCOUNTER — Other Ambulatory Visit: Payer: Self-pay | Admitting: Family

## 2021-11-21 DIAGNOSIS — E785 Hyperlipidemia, unspecified: Secondary | ICD-10-CM

## 2021-11-21 DIAGNOSIS — D696 Thrombocytopenia, unspecified: Secondary | ICD-10-CM

## 2021-11-21 DIAGNOSIS — I4819 Other persistent atrial fibrillation: Secondary | ICD-10-CM

## 2021-11-23 ENCOUNTER — Other Ambulatory Visit (HOSPITAL_COMMUNITY): Payer: Self-pay

## 2021-11-26 ENCOUNTER — Other Ambulatory Visit (HOSPITAL_COMMUNITY): Payer: Self-pay

## 2021-11-26 NOTE — Progress Notes (Signed)
? ? ? ? ?LGX:[id number]-DE atrial fibrillation. Cardiac CTA September 2022 showed calcium score 192 which was 63rd percentile, 25 to 49% proximal LAD which had negative FFR. Echocardiogram repeated March 2023 and showed normal LV function, moderate left atrial enlargement, moderate right atrial enlargement, mild mitral regurgitation, mild to moderate tricuspid regurgitation, mild aortic insufficiency. Patient had successful cardioversion 4/21, 9/22 and 1/23. There has been discussion about initiation of antiarrhythmic with repeat cardioversion versus ablation.  Since last seen she has some chest pressure both with activities and at rest.  She notes increased dyspnea on exertion.  No orthopnea, PND, pedal edema, syncope. ? ?Current Outpatient Medications  ?Medication Sig Dispense Refill  ? amoxicillin (AMOXIL) 500 MG capsule Take 2,000 mg by mouth See admin instructions. Prior to dental procedures    ? apixaban (ELIQUIS) 5 MG TABS tablet TAKE 1 TABLET BY MOUTH TWICE DAILY 180 tablet 1  ? Brimonidine Tartrate (LUMIFY) 0.025 % SOLN Place 1 drop into both eyes daily as needed (red eyes).    ? Carboxymeth-Glyc-Polysorb PF (REFRESH OPTIVE MEGA-3) 0.5-1-0.5 % SOLN Place 1 drop into both eyes 4 (four) times daily as needed (dry eyes).    ? ketotifen (ZADITOR) 0.025 % ophthalmic solution Place 1 drop into both eyes 2 (two) times daily as needed (itchy eyes).    ? loratadine (CLARITIN) 10 MG tablet Take 10 mg by mouth daily as needed for allergies.    ? melatonin 5 MG TABS Take 5 mg by mouth at bedtime. Sometimes patient will take a 3 mg tablet    ? NON FORMULARY Take 2 tablets by mouth daily. BONE-UP    ? Omega-3 Fatty Acids (FISH OIL PO) Take 3 capsules by mouth daily.    ? rosuvastatin (CRESTOR) 20 MG tablet Take 1 tablet (20 mg total) by mouth daily. 90 tablet 1  ? Simethicone (GAS-X PO) Take 1 capsule by mouth as needed (gas).    ? Throat Lozenges (COUGH DROPS MT) Use as directed in the mouth or throat as needed (Patient  uses as a sweetner).    ? ?No current facility-administered medications for this visit.  ? ? ? ?Past Medical History:  ?Diagnosis Date  ? Anxiety   ? Arthritis   ? "left knee; left hip; lower back; mild to moderate in right hip" (09/30/2016)  ? Atrial fibrillation ([place])   ? Chronic lower back pain   ? Gastritis   ? Heart palpitations   ? History of bone density study 2018  ? History of mammogram 2021  ? History of MRI 2018  ? History of Papanicolaou smear of cervix   ? Hypercholesteremia   ? Osteopenia   ? Seasonal allergies   ? ? ?Past Surgical History:  ?Procedure Laterality Date  ? CARDIOVERSION N/A 11/17/2019  ? Procedure: CARDIOVERSION;  Surgeon: [name], MD;  Location: [place];  Service: Cardiovascular;  Laterality: N/A;  ? CARDIOVERSION N/A 04/16/2021  ? Procedure: CARDIOVERSION;  Surgeon: Skeet Latch, MD;  Location: [place];  Service: Cardiovascular;  Laterality: N/A;  ? CARDIOVERSION N/A 08/15/2021  ? Procedure: CARDIOVERSION;  Surgeon: Janina Mayo, MD;  Location: [facility]COPY;  Service: Cardiovascular;  Laterality: N/A;  ? COLONOSCOPY    ? DILATION AND CURETTAGE OF UTERUS    ? JOINT REPLACEMENT    ? TONSILLECTOMY    ? 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  ? Procedure: TOTAL HIP ARTHROPLASTY ANTERIOR APPROACH;  Surgeon: Dorna Leitz, MD;  Location: [place];  Service: Orthopedics;  Laterality: Left;  ? ? ?  Social History  ? ?Socioeconomic History  ? Marital status: Married  ?  Spouse name: Not on file  ? Number of children: 3  ? Years of education: Not on file  ? Highest education level: Not on file  ?Occupational History  ? Not on file  ?Tobacco Use  ? Smoking status: Former  ?  Packs/day: 2.00  ?  Types: Cigarettes  ? Smokeless tobacco: Never  ? Tobacco comments:  ?  "quit smoking mid-late 1980s"  ?Vaping Use  ? Vaping Use: Never used  ?Substance and Sexual Activity  ? Alcohol use: Not Currently  ? Drug use: Never  ? Sexual activity: Yes  ?Other Topics Concern  ? Not on file  ?Social  History Narrative  ? Tobacco use, amount per day now: None.  ? Past tobacco use, amount per day: Maximum 2 packs   ? How many years did you use tobacco: Quit 1985  ? Alcohol use (drinks per week): 3  ? Diet: Vegetables, Chicken, Fish, Grains, and Fruit.  ? Do you drink/eat things with caffeine: Coffee  ? Marital status: Married                                  What year were you married? 1969  ? Do you live in a house, apartment, assisted living, condo, trailer, etc.? House.  ? Is it one or more stories? 2 stories.  ? How many persons live in your home? 2  ? Do you have pets in your home?( please list) No.  ? Highest Level of education completed? Masters in Adult Educations.  ? Current or past profession: Regulatory affairs officer   ? Do you exercise? Yes.                                  Type and how often? Floor excercises 5 times week. Walk 2 miles 6 times week.  ? Do you have a living will? Yes  ? Do you have a DNR form?  Yes                                 If not, do you want to discuss one?  ? Do you have signed POA/HPOA forms?  Yes                      If so, please bring to you appointment  ?   ? Do you have any difficulty bathing or dressing yourself? No  ? Do you have any difficulty preparing food or eating? No  ? Do you have any difficulty managing your medications? No  ? Do you have any difficulty managing your finances? No  ? Do you have any difficulty affording your medications? No  ? ?Social Determinants of Health  ? ?Financial Resource Strain: Not on file  ?Food Insecurity: Not on file  ?Transportation Needs: Not on file  ?Physical Activity: Not on file  ?Stress: Not on file  ?Social Connections: Not on file  ?Intimate Partner Violence: Not on file  ? ? ?Family History  ?Problem Relation Age of Onset  ? Hypertension Mother   ? Cancer Father   ?     lung cancer  ? Diabetes Sister   ? Anxiety disorder Daughter   ? Breast  cancer Neg Hx   ? ? ?ROS: no fevers or chills, productive cough, hemoptysis, dysphasia,  odynophagia, melena, hematochezia, dysuria, hematuria, rash, seizure activity, orthopnea, PND, pedal edema, claudication. Remaining systems are negative. ? ?Physical Exam: ?Well-developed well-nourished in no acute distress.  ?Skin is warm and dry.  ?HEENT is normal.  ?Neck is supple.  ?Chest is clear to auscultation with normal expansion.  ?Cardiovascular exam is irregular ?Abdominal exam nontender or distended. No masses palpated. ?Extremities show no edema. ?neuro grossly intact ? ?ECG-atrial fibrillation at a rate of 59, left axis deviation, no ST changes.  Personally reviewed.  ? ?A/P ? ?1 paroxysmal atrial fibrillation-patient is back in atrial fibrillation this morning.  She is symptomatic with increased dyspnea on exertion.  I would like for her to maintain sinus rhythm if possible.  She would prefer ablation versus consideration of antiarrhythmic therapy such as amiodarone or Tikosyn.  We will arrange for her to see Dr. Curt Bears back to discuss ablation.  Continue apixaban (will need to decrease dose to 2.5 mg twice daily on August 21 when she turns 81).  Note follow-up echocardiogram showed normal LV function. ? ?2 coronary artery disease-mild on previous CTA.  Continue statin.  Patient is not on aspirin given need for apixaban. ? ?3 hyperlipidemia-continue statin.  Note LFTs mildly elevated recently.  We will repeat in 3 months. ? ?Kirk Ruths, MD ? ? ? ?

## 2021-12-02 ENCOUNTER — Other Ambulatory Visit (HOSPITAL_COMMUNITY): Payer: Self-pay

## 2021-12-02 ENCOUNTER — Other Ambulatory Visit: Payer: PPO

## 2021-12-02 DIAGNOSIS — I4819 Other persistent atrial fibrillation: Secondary | ICD-10-CM

## 2021-12-02 DIAGNOSIS — E785 Hyperlipidemia, unspecified: Secondary | ICD-10-CM | POA: Diagnosis not present

## 2021-12-02 DIAGNOSIS — D696 Thrombocytopenia, unspecified: Secondary | ICD-10-CM | POA: Diagnosis not present

## 2021-12-02 MED FILL — Apixaban Tab 5 MG: ORAL | 60 days supply | Qty: 120 | Fill #0 | Status: AC

## 2021-12-03 ENCOUNTER — Telehealth: Payer: Self-pay

## 2021-12-03 LAB — CBC WITH DIFFERENTIAL/PLATELET
Absolute Monocytes: 408 cells/uL (ref 200–950)
Basophils Absolute: 21 cells/uL (ref 0–200)
Basophils Relative: 0.7 %
Eosinophils Absolute: 78 cells/uL (ref 15–500)
Eosinophils Relative: 2.6 %
HCT: 42.4 % (ref 35.0–45.0)
Hemoglobin: 14.1 g/dL (ref 11.7–15.5)
Lymphs Abs: 1062 cells/uL (ref 850–3900)
MCH: 33.3 pg — ABNORMAL HIGH (ref 27.0–33.0)
MCHC: 33.3 g/dL (ref 32.0–36.0)
MCV: 100 fL (ref 80.0–100.0)
MPV: 10.8 fL (ref 7.5–12.5)
Monocytes Relative: 13.6 %
Neutro Abs: 1431 cells/uL — ABNORMAL LOW (ref 1500–7800)
Neutrophils Relative %: 47.7 %
Platelets: 128 10*3/uL — ABNORMAL LOW (ref 140–400)
RBC: 4.24 10*6/uL (ref 3.80–5.10)
RDW: 11.2 % (ref 11.0–15.0)
Total Lymphocyte: 35.4 %
WBC: 3 10*3/uL — ABNORMAL LOW (ref 3.8–10.8)

## 2021-12-03 LAB — COMPLETE METABOLIC PANEL WITH GFR
AG Ratio: 2.3 (calc) (ref 1.0–2.5)
ALT: 37 U/L — ABNORMAL HIGH (ref 6–29)
AST: 42 U/L — ABNORMAL HIGH (ref 10–35)
Albumin: 4.1 g/dL (ref 3.6–5.1)
Alkaline phosphatase (APISO): 57 U/L (ref 37–153)
BUN: 25 mg/dL (ref 7–25)
CO2: 29 mmol/L (ref 20–32)
Calcium: 9.2 mg/dL (ref 8.6–10.4)
Chloride: 106 mmol/L (ref 98–110)
Creat: 0.91 mg/dL (ref 0.60–1.00)
Globulin: 1.8 g/dL (calc) — ABNORMAL LOW (ref 1.9–3.7)
Glucose, Bld: 99 mg/dL (ref 65–139)
Potassium: 3.9 mmol/L (ref 3.5–5.3)
Sodium: 142 mmol/L (ref 135–146)
Total Bilirubin: 0.8 mg/dL (ref 0.2–1.2)
Total Protein: 5.9 g/dL — ABNORMAL LOW (ref 6.1–8.1)
eGFR: 64 mL/min/{1.73_m2} (ref >=60)

## 2021-12-03 LAB — LIPID PANEL
Cholesterol: 114 mg/dL (ref <200)
HDL: 60 mg/dL (ref >=50)
LDL Cholesterol (Calc): 41 mg/dL (calc)
Non-HDL Cholesterol (Calc): 54 mg/dL (calc) (ref <130)
Total CHOL/HDL Ratio: 1.9 (calc) (ref <5.0)
Triglycerides: 53 mg/dL (ref <150)

## 2021-12-03 LAB — TSH: TSH: 3.28 mIU/L (ref 0.40–4.50)

## 2021-12-03 NOTE — Telephone Encounter (Signed)
-----   Message from Heath Lark, MD sent at 12/03/2021  8:10 AM EDT ----- ?From last visit, we wanted to wait for results of her lab with PCP ?I reviewed her labs from yesterday, not normal ?I would like to see her back either in person or virtual visit next week to discuss this ?Please schedule appt ? ?

## 2021-12-03 NOTE — Telephone Encounter (Signed)
Called and given below message. She verbalized understanding. She is aware of appt on 5/15. Instructed to arrive 20 mins early. ?

## 2021-12-04 ENCOUNTER — Ambulatory Visit (INDEPENDENT_AMBULATORY_CARE_PROVIDER_SITE_OTHER): Payer: PPO | Admitting: Family

## 2021-12-04 VITALS — BP 118/66 | HR 56 | Temp 97.5°F

## 2021-12-04 DIAGNOSIS — D696 Thrombocytopenia, unspecified: Secondary | ICD-10-CM | POA: Diagnosis not present

## 2021-12-04 DIAGNOSIS — Z0001 Encounter for general adult medical examination with abnormal findings: Secondary | ICD-10-CM

## 2021-12-04 DIAGNOSIS — D709 Neutropenia, unspecified: Secondary | ICD-10-CM | POA: Diagnosis not present

## 2021-12-04 DIAGNOSIS — E785 Hyperlipidemia, unspecified: Secondary | ICD-10-CM

## 2021-12-04 DIAGNOSIS — I4819 Other persistent atrial fibrillation: Secondary | ICD-10-CM

## 2021-12-04 NOTE — Patient Instructions (Signed)
-   Follow up with Hematologist oncologist as schedule to evaluate low platelets and low white blood cells  ? ?- Also follow up with cardiologist as schedule to evaluate Crestor dose due to high liver enzymes.Notify provider if Crestor is adjusted then will recheck Liver enzymes.  ?

## 2021-12-04 NOTE — Progress Notes (Signed)
? ?Provider: Marlowe Sax FNP-C  ? ?Jackson Coffield, Nelda Bucks, NP ? ?Patient Care Team: ?Karely Hurtado, Nelda Bucks, NP as PCP - General (Family Medicine) ?[name], MD as PCP - Cardiology (Cardiology) ?Constance Haw, MD as PCP - Electrophysiology (Cardiology) ?Molli Posey, MD as Consulting Physician (Obstetrics and Gynecology) ?[name], MD as Consulting Physician (Cardiology) ?Stephannie Li, OD (Ophthalmology) ? ?Extended Emergency Contact Information ?Primary Emergency Contact: Swartz,Tom ?Address: [address] ?         [place] [address] [place] ?Mobile Phone: [phone] ?Relation: Spouse ?Secondary Emergency Contact: Mccrory,Angela ?Address: [address] ?         [place], [place] [address] [place] ?Mobile Phone: [phone] ?Relation: Daughter ? ?Code Status:  Full Code  ?Goals of care: Advanced Directive information ? ?  12/04/2021  ?  2:06 PM  ?Advanced Directives  ?Does Patient Have a Medical Advance Directive? Yes  ?Type of Advance Directive Out of facility DNR (pink MOST or yellow form);Healthcare Power of Attorney  ?Does patient want to make changes to medical advance directive? No - Patient declined  ?Copy of [place] in Chart? Yes - validated most recent copy scanned in chart (See row information)  ?Pre-existing out of facility DNR order (yellow form or pink MOST form) Pink MOST form placed in chart (order not valid for inpatient use)  ? ? ? ?Chief Complaint  ?Patient presents with  ? Annual Exam  ?  Yearly physical. Weight not taken due to defected scales(in process of being replaced).   ? Immunizations  ?  Discuss the need for Covid Booster.  ? ? ?HPI:  ?Pt is a 81 y.o. female seen today for annual physical examination and medical management of chronic diseases.  She has a medical history of arterial fibrillation, thrombocytopenia, leukopenia, osteoarthritis, lumbar radiculopathy, seasonal allergies, generalized anxiety  hypercholesterolemia ?Had recent lab results reviewed and discussed during visit.  White blood cells slightly down 3.0, platelets also down 128 previous was 142 with absolute neutrophils 1,431.  Has an appointment for follow-up with her hematologist oncology coming up on 12/09/2021.  She denies any signs of bleeding. ? ?Her liver enzymes continue to be elevated but has improved compared to previous.Total protein slightly low at 5.9. She reports diet mainly vegetarian.  She denies any recent use of alcohol. She is currently on Crestor 20 mg tablet daily.Discuss reducing Crestor to 10 mg tablet daily but she would like to discuss Crestor use with her cardiologist on her upcoming visit next week on 12/10/2021.  She denies any muscle aches, weakness or muscle pain ? ? ?Past Medical History:  ?Diagnosis Date  ? Anxiety   ? Arthritis   ? "left knee; left hip; lower back; mild to moderate in right hip" (09/30/2016)  ? Atrial fibrillation ([place])   ? Chronic lower back pain   ? Gastritis   ? Heart palpitations   ? History of bone density study 2018  ? History of mammogram 2021  ? History of MRI 2018  ? History of Papanicolaou smear of cervix   ? Hypercholesteremia   ? Osteopenia   ? Seasonal allergies   ? ?Past Surgical History:  ?Procedure Laterality Date  ? CARDIOVERSION N/A 11/17/2019  ? Procedure: CARDIOVERSION;  Surgeon: [name], MD;  Location: [place];  Service: Cardiovascular;  Laterality: N/A;  ? CARDIOVERSION N/A 04/16/2021  ? Procedure: CARDIOVERSION;  Surgeon: Skeet Latch, MD;  Location: [place];  Service: Cardiovascular;  Laterality: N/A;  ? CARDIOVERSION N/A 08/15/2021  ? Procedure: CARDIOVERSION;  Surgeon: Janina Mayo, MD;  Location: [facility] ENDOSCOPY;  Service: Cardiovascular;  Laterality: N/A;  ? COLONOSCOPY    ? DILATION AND CURETTAGE OF UTERUS    ? JOINT REPLACEMENT    ? TONSILLECTOMY    ? 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  ? Procedure: TOTAL HIP ARTHROPLASTY ANTERIOR APPROACH;  Surgeon:  Dorna Leitz, MD;  Location: [place];  Service: Orthopedics;  Laterality: Left;  ? ? ?Allergies  ?Allergen Reactions  ? Nsaids Other (See Comments)  ?  Caused painful stomach problems  ? Prednisone Other (See Comments)  ?  Caused painful stomach problems  ? ? ?Allergies as of 12/04/2021   ? ?   Reactions  ? Nsaids Other (See Comments)  ? Caused painful stomach problems  ? Prednisone Other (See Comments)  ? Caused painful stomach problems  ? ?  ? ?  ?Medication List  ?  ? ?  ? Accurate as of Dec 04, 2021  3:09 PM. If you have any questions, ask your nurse or doctor.  ?  ?  ? ?  ? ?STOP taking these medications   ? ?Premarin vaginal cream ?Generic drug: conjugated estrogens ?Stopped by: Sandrea Hughs, NP ?  ? ?  ? ?TAKE these medications   ? ?amoxicillin 500 MG capsule ?Commonly known as: AMOXIL ?Take 2,000 mg by mouth See admin instructions. Prior to dental procedures ?  ?COUGH DROPS MT ?Use as directed in the mouth or throat as needed (Patient uses as a sweetner). ?  ?Eliquis 5 MG Tabs tablet ?Generic drug: apixaban ?TAKE 1 TABLET BY MOUTH TWICE DAILY ?  ?FISH OIL PO ?Take 3 capsules by mouth daily. ?  ?GAS-X PO ?Take 1 capsule by mouth as needed (gas). ?  ?ketotifen 0.025 % ophthalmic solution ?Commonly known as: ZADITOR ?Place 1 drop into both eyes 2 (two) times daily as needed (itchy eyes). ?  ?loratadine 10 MG tablet ?Commonly known as: CLARITIN ?Take 10 mg by mouth daily as needed for allergies. ?  ?Lumify 0.025 % Soln ?Generic drug: Brimonidine Tartrate ?Place 1 drop into both eyes daily as needed (red eyes). ?  ?melatonin 5 MG Tabs ?Take 5 mg by mouth at bedtime. Sometimes patient will take a 3 mg tablet ?  ?NON FORMULARY ?Take 2 tablets by mouth daily. BONE-UP ?  ?Refresh Optive Mega-3 0.5-1-0.5 % Soln ?Generic drug: Carboxymeth-Glyc-Polysorb PF ?Place 1 drop into both eyes 4 (four) times daily as needed (dry eyes). ?  ?rosuvastatin 20 MG tablet ?Commonly known as: CRESTOR ?Take 1 tablet (20 mg total) by  mouth daily. ?  ? ?  ? ? ?Review of Systems  ?Constitutional:  Negative for appetite change, chills, fatigue, fever and unexpected weight change.  ?HENT:  Negative for congestion, dental problem, ear discharge, ear pain, facial swelling, hearing loss, nosebleeds, postnasal drip, rhinorrhea, sinus pressure, sinus pain, sneezing, sore throat, tinnitus and trouble swallowing.   ?Eyes:  Negative for pain, discharge, redness, itching and visual disturbance.  ?Respiratory:  Negative for cough, chest tightness, shortness of breath and wheezing.   ?Cardiovascular:  Negative for chest pain, palpitations and leg swelling.  ?Gastrointestinal:  Negative for abdominal distention, abdominal pain, blood in stool, constipation, diarrhea, nausea and vomiting.  ?Endocrine: Negative for cold intolerance, heat intolerance, polydipsia, polyphagia and polyuria.  ?Genitourinary:  Negative for difficulty urinating, dysuria, flank pain, frequency and urgency.  ?Musculoskeletal:  Negative for arthralgias, back pain, gait problem, joint  swelling, myalgias, neck pain and neck stiffness.  ?Skin:  Negative for color change, pallor, rash and wound.  ?Neurological:  Negative for dizziness, syncope, speech difficulty, weakness, light-headedness, numbness and headaches.  ?Hematological:  Does not bruise/bleed easily.  ?Psychiatric/Behavioral:  Negative for agitation, behavioral problems, confusion, hallucinations, self-injury, sleep disturbance and suicidal ideas. The patient is not nervous/anxious.   ? ?Immunization History  ?Administered Date(s) Administered  ? Influenza Split 04/30/2015, 04/20/2016, 04/27/2018, 06/16/2019  ? Influenza, High Dose Seasonal PF 06/28/2017, 06/16/2019, 06/02/2020  ? Influenza-Unspecified 06/19/2021  ? PFIZER(Purple Top)SARS-COV-2 Vaccination 08/12/2019, 09/02/2019, 05/01/2020, 11/13/2020, 04/12/2021  ? Pneumococcal Conjugate-13 09/30/2013  ? Pneumococcal Polysaccharide-23 07/27/2003, 09/24/2010  ? Td 04/26/2001,  12/25/2017  ? Tdap 09/24/2010  ? Zoster Recombinat (Shingrix) 11/16/2017, 01/06/2018  ? Zoster, Live 12/11/2006, 04/27/2018, 06/29/2018  ? ?Pertinent  Health Maintenance Due  ?Topic Date Due  ? INFLUENZA VACCINE

## 2021-12-05 ENCOUNTER — Ambulatory Visit: Payer: PPO | Admitting: Family

## 2021-12-09 ENCOUNTER — Other Ambulatory Visit: Payer: Self-pay

## 2021-12-09 ENCOUNTER — Other Ambulatory Visit (HOSPITAL_COMMUNITY): Payer: Self-pay

## 2021-12-09 ENCOUNTER — Encounter: Payer: Self-pay | Admitting: Hematology and Oncology

## 2021-12-09 ENCOUNTER — Inpatient Hospital Stay: Payer: PPO | Attending: Hematology and Oncology | Admitting: Hematology and Oncology

## 2021-12-09 VITALS — BP 110/64 | HR 65 | Temp 97.3°F | Resp 16 | Ht 67.0 in | Wt 129.2 lb

## 2021-12-09 DIAGNOSIS — Z7901 Long term (current) use of anticoagulants: Secondary | ICD-10-CM | POA: Insufficient documentation

## 2021-12-09 DIAGNOSIS — R7989 Other specified abnormal findings of blood chemistry: Secondary | ICD-10-CM | POA: Insufficient documentation

## 2021-12-09 DIAGNOSIS — D72819 Decreased white blood cell count, unspecified: Secondary | ICD-10-CM | POA: Diagnosis not present

## 2021-12-09 DIAGNOSIS — K7689 Other specified diseases of liver: Secondary | ICD-10-CM | POA: Diagnosis not present

## 2021-12-09 DIAGNOSIS — D696 Thrombocytopenia, unspecified: Secondary | ICD-10-CM | POA: Diagnosis not present

## 2021-12-09 DIAGNOSIS — R748 Abnormal levels of other serum enzymes: Secondary | ICD-10-CM | POA: Diagnosis not present

## 2021-12-09 DIAGNOSIS — Z87891 Personal history of nicotine dependence: Secondary | ICD-10-CM | POA: Insufficient documentation

## 2021-12-09 NOTE — Assessment & Plan Note (Signed)
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

## 2021-12-09 NOTE — Assessment & Plan Note (Addendum)
The cause is unknown but I suspect she might have component of liver disease given history of chronic alcohol use as well as dietary choices of food due to her preferred selection for vegetarian based diet ?Her liver enzymes are abnormal ?For now, I would like to defer ordering imaging study as it would not change management ?I will order repeat serum vitamin X01, folic acid as well as thiamine level for evaluation ?There is no contraindication to remain on antiplatelet agents or anticoagulants as long as the platelet is greater than 50,000. ? ?

## 2021-12-09 NOTE — Assessment & Plan Note (Signed)
She has intermittent leukopenia for the past 5 years, cause unknown but could be related to past history of alcohol use and trace mineral deficiencies She is not symptomatic Observe only  

## 2021-12-09 NOTE — Progress Notes (Signed)
? ?[place] ?OFFICE PROGRESS NOTE ? ?[name], Nelda Bucks, NP ? ?ASSESSMENT & PLAN:  ?Thrombocytopenia ([place]) ?The cause is unknown but I suspect she might have component of liver disease given history of chronic alcohol use as well as dietary choices of food due to her preferred selection for vegetarian based diet ?Her liver enzymes are abnormal ?For now, I would like to defer ordering imaging study as it would not change management ?I will order repeat serum vitamin Z61, folic acid as well as thiamine level for evaluation ?There is no contraindication to remain on antiplatelet agents or anticoagulants as long as the platelet is greater than 50,000. ? ? ?Leukopenia ?She has intermittent leukopenia for the past 5 years, cause unknown but could be related to past history of alcohol use and trace mineral deficiencies ?She is not symptomatic ?Observe only ? ? 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 ?Orders Placed This Encounter  ?Procedures  ? Vitamin B1  ?  Standing Status:   Future  ?  Standing Expiration Date:   12/09/2022  ? Copper, serum  ?  Standing Status:   Future  ?  Standing Expiration Date:   12/09/2022  ? CBC with Differential ([place] Only)  ?  Standing Status:   Future  ?  Standing Expiration Date:   12/10/2022  ? CMP ([facility] only)  ?  Standing Status:   Future  ?  Standing Expiration Date:   12/10/2022  ? Methylmalonic acid, serum  ?  Standing Status:   Future  ?  Standing Expiration Date:   12/09/2022  ? ? ?The total time spent in the appointment was 20 minutes encounter with patients including review of chart and various tests results, discussions about plan of care and coordination of care plan ? ? All questions were answered. The patient knows to call the clinic with any problems,  questions or concerns. No barriers to learning was detected. ? ? ? Heath Lark, MD ?5/15/20233:08 PM ? ?INTERVAL HISTORY: ?Shelia [name] 81 y.o. female returns for further follow-up for recent findings of abnormal CBC ?I have reviewed copies of recent blood tests performed approximately a week ago ?She denies recent infection ?The patient denies any recent signs or symptoms of bleeding such as spontaneous epistaxis, hematuria or hematochezia. ?Her appetite is fair ?The patient has selection for vegetarian-based diet encounter approximately 40 to 50 g of protein per day ? ?SUMMARY OF HEMATOLOGIC HISTORY: ?Shelia [name] 80 y.o. 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 ?She used to smoke but quit smoking for many years ?She is on chronic anticoagulation therapy and denies recent bleeding ?She had received blood transfusion when she had hip surgery in the past ?No  recent infection.  Her last antibiotic therapy was more than 6 months ?The patient denies alcohol intake since end of 2022 ? ?I have reviewed the past medical history, past surgical history, social history and family history with the patient and they are unchanged from previous note. ? ?ALLERGIES:  is allergic to nsaids and prednisone. ? ?MEDICATIONS:  ?Current Outpatient Medications  ?Medication Sig Dispense Refill  ? amoxicillin (AMOXIL) 500 MG capsule Take 2,000 mg by mouth See admin  instructions. Prior to dental procedures    ? apixaban (ELIQUIS) 5 MG TABS tablet TAKE 1 TABLET BY MOUTH TWICE DAILY 180 tablet 1  ? Brimonidine Tartrate (LUMIFY) 0.025 % SOLN Place 1 drop into both eyes daily as needed (red eyes).    ? Carboxymeth-Glyc-Polysorb PF (REFRESH OPTIVE MEGA-3) 0.5-1-0.5 % SOLN Place 1 drop into both eyes 4 (four) times daily as needed (dry eyes).    ? ketotifen (ZADITOR) 0.025 % ophthalmic solution Place 1 drop into both eyes 2 (two) times daily as needed (itchy eyes).    ? loratadine (CLARITIN) 10 MG tablet Take 10 mg by mouth daily as needed for allergies.    ? melatonin 5 MG TABS Take 5 mg by mouth at bedtime. Sometimes patient will take a 3 mg tablet    ? NON FORMULARY Take 2 tablets by mouth daily. BONE-UP    ? Omega-3 Fatty Acids (FISH OIL PO) Take 3 capsules by mouth daily.    ? rosuvastatin (CRESTOR) 20 MG tablet Take 1 tablet (20 mg total) by mouth daily. 90 tablet 1  ? Simethicone (GAS-X PO) Take 1 capsule by mouth as needed (gas).    ? Throat Lozenges (COUGH DROPS MT) Use as directed in the mouth or throat as needed (Patient uses as a sweetner).    ? ?No current facility-administered medications for this visit.  ?  ? ?REVIEW OF SYSTEMS:   ?Constitutional: Denies fevers, chills or night sweats ?Eyes: Denies blurriness of vision ?Ears, nose, mouth, throat, and face: Denies mucositis or sore throat ?Respiratory: Denies cough, dyspnea or wheezes ?Cardiovascular: Denies palpitation, chest discomfort or lower extremity swelling ?Gastrointestinal:  Denies nausea, heartburn or change in bowel habits ?Skin: Denies abnormal skin rashes ?Lymphatics: Denies new lymphadenopathy or easy bruising ?Neurological:Denies numbness, tingling or new weaknesses ?Behavioral/Psych: Mood is stable, no new changes  ?All other systems were reviewed with the patient and are negative. ? ?PHYSICAL EXAMINATION: ?ECOG PERFORMANCE STATUS: 0 - Asymptomatic ? ?Vitals:  ? 12/09/21 0840  ?BP: 110/64  ?Pulse: 65   ?Resp: 16  ?Temp: (!) 97.3 ?F (36.3 ?C)  ?SpO2: 95%  ? ?Filed Weights  ? 12/09/21 0840  ?Weight: 129 lb 3.2 oz (58.6 kg)  ? ? ?GENERAL:alert, no distress and comfortable ?NEURO: alert & oriented x 3 with fluent speech, no focal motor/sensory deficits ? ?LABORATORY DATA:  ?I have reviewed the data as listed ? ?   ?Component Value Date/Time  ? NA 142 12/02/2021 1009  ? NA 144 08/13/2021 0955  ? K 3.9 12/02/2021 1009  ? CL 106 12/02/2021 1009  ? CO2 29 12/02/2021 1009  ? GLUCOSE 99 12/02/2021 1009  ? BUN 25 12/02/2021 1009  ? BUN 26 08/13/2021 0955  ? CREATININE 0.91 12/02/2021 1009  ? CALCIUM 9.2 12/02/2021 1009  ? PROT 5.9 (L) 12/02/2021 1009  ? PROT 5.8 (L) 07/25/2021 7408  ? ALBUMIN 4.4 09/13/2021 1322  ? ALBUMIN 4.5 07/25/2021 0907  ? AST 42 (H) 12/02/2021 1009  ? AST 44 (H) 09/13/2021 1322  ?  ALT 37 (H) 12/02/2021 1009  ? ALT 33 09/13/2021 1322  ? ALKPHOS 48 09/13/2021 1322  ? BILITOT 0.8 12/02/2021 1009  ? BILITOT 0.8 09/13/2021 1322  ? GFRNONAA >60 09/13/2021 1322  ? GFRNONAA 79 12/03/2020 1153  ? GFRAA 91 12/03/2020 1153  ? ? ?No results found for: SPEP, UPEP ? ?Lab Results  ?Component Value Date  ? WBC 3.0 (L) 12/02/2021  ? NEUTROABS 1,431 (L) 12/02/2021  ? HGB 14.1 12/02/2021  ? HCT 42.4 12/02/2021  ? MCV 100.0 12/02/2021  ? PLT 128 (L) 12/02/2021  ? ? ?  Chemistry   ?   ?Component Value Date/Time  ? NA 142 12/02/2021 1009  ? NA 144 08/13/2021 0955  ? K 3.9 12/02/2021 1009  ? CL 106 12/02/2021 1009  ? CO2 29 12/02/2021 1009  ? BUN 25 12/02/2021 1009  ? BUN 26 08/13/2021 0955  ? CREATININE 0.91 12/02/2021 1009  ?    ?Component Value Date/Time  ? CALCIUM 9.2 12/02/2021 1009  ? ALKPHOS 48 09/13/2021 1322  ? AST 42 (H) 12/02/2021 1009  ? AST 44 (H) 09/13/2021 1322  ? ALT 37 (H) 12/02/2021 1009  ? ALT 33 09/13/2021 1322  ? BILITOT 0.8 12/02/2021 1009  ? BILITOT 0.8 09/13/2021 1322  ?  ? ? ?

## 2021-12-10 ENCOUNTER — Ambulatory Visit: Payer: PPO | Admitting: Cardiology

## 2021-12-10 ENCOUNTER — Encounter: Payer: Self-pay | Admitting: Cardiology

## 2021-12-10 VITALS — BP 100/72 | HR 82 | Ht 67.0 in | Wt 129.4 lb

## 2021-12-10 DIAGNOSIS — E785 Hyperlipidemia, unspecified: Secondary | ICD-10-CM

## 2021-12-10 DIAGNOSIS — I480 Paroxysmal atrial fibrillation: Secondary | ICD-10-CM

## 2021-12-10 DIAGNOSIS — I2510 Atherosclerotic heart disease of native coronary artery without angina pectoris: Secondary | ICD-10-CM

## 2021-12-10 NOTE — Patient Instructions (Signed)
?  Follow-Up: ?At [facility], you and your health needs are our priority.  As part of our continuing mission to provide you with exceptional heart care, we have created designated Provider Care Teams.  These Care Teams include your primary Cardiologist (physician) and Advanced Practice Providers (APPs -  Physician Assistants and Nurse Practitioners) who all work together to provide you with the care you need, when you need it. ? ?We recommend signing up for the patient portal called "MyChart".  Sign up information is provided on this After Visit Summary.  MyChart is used to connect with patients for Virtual Visits (Telemedicine).  Patients are able to view lab/test results, encounter notes, upcoming appointments, etc.  Non-urgent messages can be sent to your provider as well.   ?To learn more about what you can do with MyChart, go to [email or web address].   ? ?Your next appointment:   ?6 month(s) ? ?The format for your next appointment:   ?In Person ? ?Provider:   ?Kirk Ruths, MD   ? ? ?Other Instructions ? ?FOLLOW UP APPOINTMENT WITH DR CAMNITZ TO DISCUSS ABLATION ? ?Important Information About Sugar ? ? ? ? ?  ?

## 2021-12-18 ENCOUNTER — Telehealth: Payer: Self-pay

## 2021-12-18 NOTE — Telephone Encounter (Signed)
She called and left a message requesting referral to dietician. She is struggling with getting enough protein.

## 2021-12-19 ENCOUNTER — Other Ambulatory Visit: Payer: Self-pay

## 2021-12-19 ENCOUNTER — Telehealth: Payer: Self-pay

## 2021-12-19 ENCOUNTER — Other Ambulatory Visit (HOSPITAL_COMMUNITY): Payer: Self-pay

## 2021-12-19 ENCOUNTER — Telehealth: Payer: Self-pay | Admitting: Hematology and Oncology

## 2021-12-19 DIAGNOSIS — D696 Thrombocytopenia, unspecified: Secondary | ICD-10-CM

## 2021-12-19 DIAGNOSIS — D72819 Decreased white blood cell count, unspecified: Secondary | ICD-10-CM

## 2021-12-19 MED ORDER — AMOXICILLIN 500 MG PO CAPS
ORAL_CAPSULE | ORAL | 0 refills | Status: DC
  Filled 2021-12-19: qty 4, 1d supply, fill #0

## 2021-12-19 NOTE — Telephone Encounter (Signed)
Patient called and informed that nutrition consult had been placed per her request. Patient aware that a dietician from [facility] will be reaching out to her.  Patient appreciative of call.

## 2021-12-19 NOTE — Telephone Encounter (Signed)
Hi Erin,  Can you send dietitian consult?

## 2021-12-19 NOTE — Telephone Encounter (Signed)
.  Called pt per 5/25 inbasket , Patient was unavailable, a message with appt time and date was left with number on file.

## 2021-12-20 ENCOUNTER — Telehealth: Payer: Self-pay

## 2021-12-20 NOTE — Telephone Encounter (Signed)
Clarified further questions with patient regarding upcoming nutrition consult. All questions answered and patient appreciative of call.

## 2021-12-27 ENCOUNTER — Other Ambulatory Visit: Payer: Self-pay

## 2021-12-27 ENCOUNTER — Inpatient Hospital Stay: Payer: PPO | Attending: Hematology and Oncology | Admitting: Dietician

## 2021-12-27 NOTE — Progress Notes (Signed)
Nutrition Assessment   Reason for Assessment: Pt request   ASSESSMENT: 81 year old female with thrombocytopenia, currently under observation. Patient is under the care of Dr. Alvy Bimler  Past medical history includes paroxysmal atrial fibrillation, osteoarthritis, elevated liver enzymes, h/o alcohol use   Met with patient in office. She reports walking to appointment today from her home. This took ~15 minutes. Patient reports going to [facility] 3 times weekly for exercise. She is here today to discuss ways to increase intake of protein. Per pt she was instructed by MD to aim for 90 grams of protein daily. Patient is adding protein powder to milk for shake daily. This has a total of 29 grams of protein. Patient is eating 3 meals daily. For breakfast she has rolled oats with milk, almonds, walnuts, blueberries, figs. Patient likes big salads for lunch, sometimes adds hard boiled egg to this. Last night patient ate 2 chicken tenders, rice, veggies for supper. Patient reports she does not want to gain weight, she has monitored her weight closely for many years.    Nutrition Focused Physical Exam: deferred  Medications: reviewed    Labs: reviewed    Anthropometrics:   Height: 5'7" Weight: 129 lb 6.4 oz  UBW: 128-131 lb x 8 months BMI: 20.27   NUTRITION DIAGNOSIS: Food and nutrition knowledge deficit related to thrombocytopenia as evidenced by no prior knowledge needed for related nutrition information    INTERVENTION:  Educated on foods with protein, encouraged protein source with all meals and snacks - food list with grams protein/serving provided  Encouraged bedtime snack Encouraged activity as able  All questions answered Contact information provided    MONITORING, EVALUATION, GOAL: Patient to incorporate protein source with all meals and snacks   Next Visit: No follow-up scheduled

## 2022-01-06 ENCOUNTER — Telehealth: Payer: Self-pay

## 2022-01-06 NOTE — Telephone Encounter (Signed)
Message from pt, she would like to participate in PREP in the class that started on 12/30/21 Returned call. Will meet her in the Grp ex room 2 at [facility]. Will provide her materials for class and complete fit test/measurements today. Will ask her to complete paperwork and bring back surveys.

## 2022-01-07 NOTE — Progress Notes (Signed)
[facility] PREP Weekly Session  Patient Details  Name: Shelia [name] MRN: [id number] Date of Birth: 1941-06-26 Age: 81 y.o. PCP: Sandrea Hughs, NP  Vitals:   01/06/22 1030  Weight: 133 lb 3.2 oz (60.4 kg)     [facility] Weekly seesion - 01/07/22 1700       [facility] "PREP" Location   [facility] "PREP" Location [facility]      Weekly Session   Topic Discussed Importance of resistance training;Other ways to be active    Minutes exercised this week 110 minutes    Classes attended to date [place] 01/07/2022, 5:54 PM

## 2022-01-14 NOTE — Progress Notes (Signed)
[facility] PREP Evaluation  Patient Details  Name: Shelia [name] MRN: [id number] Date of Birth: March 07, 1941 Age: 81 y.o. PCP: [name], Nelda Bucks, NP  Vitals:   01/06/22 1200  BP: 108/60  Pulse: 62  SpO2: 99%  Weight: 133 lb 3.2 oz (60.4 kg)     [facility] Eval - 01/14/22 1000       [facility] "PREP" Location   [facility] "PREP" Location [facility]      Referral    Referring Provider self/[name]    Reason for referral Other   AFIB, balance   Program Start Date 12/30/21   pt started on 6/12 MW 1030am-1145am x 12wks     Measurement   Waist Circumference 29.5 inches    Hip Circumference 37 inches    Body fat 34.2 percent      Information for Trainer   Goals better balance, heart health    Orthopedic Concerns OA, Foot pain    Pertinent Medical History afib, lumbar radiculopathy    Current Barriers none      Timed Up and Go (TUGS)   Timed Up and Go Low risk <9 seconds   Has difficulty rising from low seat     Mobility and Daily Activities   I find it easy to walk up or down two or more flights of stairs. 3    I have no trouble taking out the trash. 4    I do housework such as vacuuming and dusting on my own without difficulty. 4    I can easily lift a gallon of milk (8lbs). 3    I can easily walk a mile. 4    I have no trouble reaching into high cupboards or reaching down to pick up something from the floor. 4    I do not have trouble doing out-door work such as Armed forces logistics/support/administrative officer, raking leaves, or gardening. 4      Mobility and Daily Activities   I feel younger than my age. 4    I feel independent. 4    I feel energetic. 3    I live an active life.  3    I feel strong. 3    I feel healthy. 4    I feel active as other people my age. 4      How fit and strong are you.   Fit and Strong Total Score 51            Past Medical History:  Diagnosis Date   Anxiety    Arthritis    "left knee; left hip; lower back; mild to moderate in right hip" (09/30/2016)   Atrial  fibrillation (HCC)    Chronic lower back pain    Gastritis    Heart palpitations    History of bone density study 2018   History of mammogram 2021   History of MRI 2018   History of Papanicolaou smear of cervix    Hypercholesteremia    Osteopenia    Seasonal allergies    Past Surgical History:  Procedure Laterality Date   CARDIOVERSION N/A 11/17/2019   Procedure: CARDIOVERSION;  Surgeon: [name], MD;  Location: [place];  Service: Cardiovascular;  Laterality: N/A;   CARDIOVERSION N/A 04/16/2021   Procedure: CARDIOVERSION;  Surgeon: Skeet Latch, MD;  Location: [place];  Service: Cardiovascular;  Laterality: N/A;   CARDIOVERSION N/A 08/15/2021   Procedure: CARDIOVERSION;  Surgeon: Janina Mayo, MD;  Location[facility];  Service: Cardiovascular;  Laterality: N/A;  COLONOSCOPY     DILATION AND CURETTAGE OF UTERUS     JOINT REPLACEMENT     TONSILLECTOMY     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   Procedure: TOTAL HIP ARTHROPLASTY ANTERIOR APPROACH;  Surgeon: Dorna Leitz, MD;  Location: [place];  Service: Orthopedics;  Laterality: Left;   Social History   Tobacco Use  Smoking Status Former   Packs/day: 2.00   Types: Cigarettes  Smokeless Tobacco Never  Tobacco Comments   "quit smoking mid-late 1980s"   [facility] PREP Weekly Session  Patient Details  Name: Shelia [name] MRN: [id number] Date of Birth: [birth year] Age: 81 y.o. PCP: [name], Nelda Bucks, NP  Vitals:   01/06/22 1200  BP: 108/60  Pulse: 62  SpO2: 99%  Weight: 133 lb 3.2 oz (60.4 kg)     [facility] Weekly seesion - 01/14/22 1000       Weekly Session   Topic Discussed Healthy eating tips    Minutes exercised this week 240 minutes    Classes attended to date [place] 01/14/2022, 10:10 AM

## 2022-01-20 NOTE — Progress Notes (Signed)
[facility] PREP Weekly Session  Patient Details  Name: Shelia [name] MRN: [id number] Date of Birth: 04-May-1941 Age: 81 y.o. PCP: Caesar Bookman, NP  Vitals:   01/20/22 1030  Weight: 132 lb (59.9 kg)     [facility] Weekly seesion - 01/20/22 1500       [facility] "PREP" Location   [facility] "PREP" Location [facility]      Weekly Session   Topic Discussed Health habits   sleep habits   Minutes exercised this week 345 minutes    Classes attended to date 5             Pam Jerral Bonito 01/20/2022, 3:11 PM

## 2022-02-03 ENCOUNTER — Other Ambulatory Visit: Payer: Self-pay | Admitting: Cardiology

## 2022-02-03 DIAGNOSIS — I480 Paroxysmal atrial fibrillation: Secondary | ICD-10-CM

## 2022-02-04 ENCOUNTER — Other Ambulatory Visit (HOSPITAL_COMMUNITY): Payer: Self-pay

## 2022-02-04 MED ORDER — APIXABAN 5 MG PO TABS
ORAL_TABLET | ORAL | 0 refills | Status: DC
  Filled 2022-02-04: qty 180, 90d supply, fill #0

## 2022-02-04 NOTE — Telephone Encounter (Signed)
Prescription refill request for Eliquis received. Indication: Afib  Last office visit: 12/10/21 [facility] Breed)  Scr: 0.82 (09/13/21)  Age: 81 Weight: 59.9kg  Pt on appropriate dose; however, in August, pt may need dose change due to age and weight. Appropriate dose and refill sent to requested pharmacy and dosage will be re-evaluated at next refill.

## 2022-02-04 NOTE — Progress Notes (Signed)
[facility] PREP Weekly Session  Patient Details  Name: Shelia [name] MRN: [id number] Date of Birth: 11/30/1940 Age: 81 y.o. PCP: Sandrea Hughs, NP  Vitals:   02/03/22 1030  Weight: 133 lb 6.4 oz (60.5 kg)     [facility] Weekly seesion - 02/04/22 1600       [facility] "PREP" Location   [facility] "PREP" [facility]      Weekly Session   Topic Discussed Restaurant Eating   salt demo   Minutes exercised this week 230 minutes    Classes attended to date [place] 02/04/2022, 4:55 PM

## 2022-02-12 ENCOUNTER — Other Ambulatory Visit (HOSPITAL_COMMUNITY): Payer: Self-pay

## 2022-02-13 NOTE — Progress Notes (Signed)
[facility] PREP Weekly Session  Patient Details  Name: Shelia [name] MRN: [id number] Date of Birth: 02-24-1941 Age: 81 y.o. PCP: Sandrea Hughs, NP  Vitals:   02/10/22 1030  Weight: 132 lb (59.9 kg)     [facility] Weekly seesion - 02/13/22 0900       [facility] "PREP" Location   [facility] "PREP" [facility]      Weekly Session   Topic Discussed Stress management and problem solving   meditation, chair yoga   Minutes exercised this week 105 minutes    Classes attended to date [place] 02/13/2022, 10:00 AM

## 2022-02-17 NOTE — Progress Notes (Signed)
[facility] PREP Weekly Session  Patient Details  Name: Shelia [name] MRN: [id number] Date of Birth: 08-Dec-1940 Age: 81 y.o. PCP: Sandrea Hughs, NP  Vitals:   02/17/22 1210  Weight: 134 lb 3.2 oz (60.9 kg)     [facility] Weekly seesion - 02/17/22 1200       [facility] "PREP" Location   [facility] "PREP" Location [facility]      Weekly Session   Topic Discussed Expectations and non-scale victories   salt and sugar demos   Minutes exercised this week 220 minutes    Classes attended to date [place] 02/17/2022, 12:11 PM

## 2022-02-18 ENCOUNTER — Ambulatory Visit: Payer: PPO | Admitting: Cardiology

## 2022-02-18 ENCOUNTER — Encounter: Payer: Self-pay | Admitting: *Deleted

## 2022-02-18 ENCOUNTER — Encounter: Payer: Self-pay | Admitting: Cardiology

## 2022-02-18 VITALS — BP 102/60 | HR 79 | Ht 67.0 in | Wt 132.0 lb

## 2022-02-18 DIAGNOSIS — Z01812 Encounter for preprocedural laboratory examination: Secondary | ICD-10-CM

## 2022-02-18 DIAGNOSIS — D6869 Other thrombophilia: Secondary | ICD-10-CM

## 2022-02-18 DIAGNOSIS — I4819 Other persistent atrial fibrillation: Secondary | ICD-10-CM | POA: Diagnosis not present

## 2022-02-18 NOTE — Patient Instructions (Signed)
Medication Instructions:  Your physician recommends that you continue on your current medications as directed. Please refer to the Current Medication list given to you today.  *If you need a refill on your cardiac medications before your next appointment, please call your pharmacy*   Lab Work: Pre procedure labs - see procedure instruction letter:  BMP & CBC  If you have labs (blood work) drawn today and your tests are completely normal, you will receive your results only by: MyChart Message (if you have MyChart) OR A paper copy in the mail If you have any lab test that is abnormal or we need to change your treatment, we will call you to review the results.   Testing/Procedures: Your physician has requested that you have cardiac CT within 7 days PRIOR to your ablation. Cardiac computed tomography (CT) is a painless test that uses an x-ray machine to take clear, detailed pictures of your heart.  Please follow instruction below located under "other instructions". You will get a call from our office to schedule the date for this test.  Your physician has recommended that you have an ablation. Catheter ablation is a medical procedure used to treat some cardiac arrhythmias (irregular heartbeats). During catheter ablation, a long, thin, flexible tube is put into a blood vessel in your groin (upper thigh), or neck. This tube is called an ablation catheter. It is then guided to your heart through the blood vessel. Radio frequency waves destroy small areas of heart tissue where abnormal heartbeats may cause an arrhythmia to start. Please follow instruction letter given to you today.   Follow-Up: At [facility], you and your health needs are our priority.  As part of our continuing mission to provide you with exceptional heart care, we have created designated Provider Care Teams.  These Care Teams include your primary Cardiologist (physician) and Advanced Practice Providers (APPs -  Physician  Assistants and Nurse Practitioners) who all work together to provide you with the care you need, when you need it.  Your next appointment:   1 month(s) after your ablation  The format for your next appointment:   In Person  Provider:   AFib clinic   Thank you for choosing [facility]!!   Rosetta Rupnow, RN [phone]    Other Instructions  Cardiac Ablation Cardiac ablation is a procedure to destroy (ablate) some heart tissue that is sending bad signals. These bad signals cause problems in heart rhythm. The heart has many areas that make these signals. If there are problems in these areas, they can make the heart beat in a way that is not normal. Destroying some tissues can help make the heart rhythm normal. Tell your doctor about: Any allergies you have. All medicines you are taking. These include vitamins, herbs, eye drops, creams, and over-the-counter medicines. Any problems you or family members have had with medicines that make you fall asleep (anesthetics). Any blood disorders you have. Any surgeries you have had. Any medical conditions you have, such as kidney failure. Whether you are pregnant or may be pregnant. What are the risks? This is a safe procedure. But problems may occur, including: Infection. Bruising and bleeding. Bleeding into the chest. Stroke or blood clots. Damage to nearby areas of your body. Allergies to medicines or dyes. The need for a pacemaker if the normal system is damaged. Failure of the procedure to treat the problem. What happens before the procedure? Medicines Ask your doctor about: Changing or stopping your normal medicines. This is   important. Taking aspirin and ibuprofen. Do not take these medicines unless your doctor tells you to take them. Taking other medicines, vitamins, herbs, and supplements. General instructions Follow instructions from your doctor about what you cannot eat or drink. Plan to have someone take you home  from the hospital or clinic. If you will be going home right after the procedure, plan to have someone with you for 24 hours. Ask your doctor what steps will be taken to prevent infection. What happens during the procedure?  An IV tube will be put into one of your veins. You will be given a medicine to help you relax. The skin on your neck or groin will be numbed. A cut (incision) will be made in your neck or groin. A needle will be put through your cut and into a large vein. A tube (catheter) will be put into the needle. The tube will be moved to your heart. Dye may be put through the tube. This helps your doctor see your heart. Small devices (electrodes) on the tube will send out signals. A type of energy will be used to destroy some heart tissue. The tube will be taken out. Pressure will be held on your cut. This helps stop bleeding. A bandage will be put over your cut. The exact procedure may vary among doctors and hospitals. What happens after the procedure? You will be watched until you leave the hospital or clinic. This includes checking your heart rate, breathing rate, oxygen, and blood pressure. Your cut will be watched for bleeding. You will need to lie still for a few hours. Do not drive for 24 hours or as long as your doctor tells you. Summary Cardiac ablation is a procedure to destroy some heart tissue. This is done to treat heart rhythm problems. Tell your doctor about any medical conditions you may have. Tell him or her about all medicines you are taking to treat them. This is a safe procedure. But problems may occur. These include infection, bruising, bleeding, and damage to nearby areas of your body. Follow what your doctor tells you about food and drink. You may also be told to change or stop some of your medicines. After the procedure, do not drive for 24 hours or as long as your doctor tells you. This information is not intended to replace advice given to you by your  health care provider. Make sure you discuss any questions you have with your health care provider. Document Revised: 06/16/2019 Document Reviewed: 06/16/2019 Elsevier Patient Education  2023 Elsevier Inc.  

## 2022-02-18 NOTE — Progress Notes (Signed)
Electrophysiology Office Note   Date:  02/18/2022   ID:  Shelia [name], DOB September 22, 1940, MRN [id number]  PCP:  Sandrea Hughs, NP  Cardiologist:  Whitney Post Primary Electrophysiologist:  Gabriell Daigneault Meredith Leeds, MD    Chief Complaint: AF   History of Present Illness: Shelia [name] is a 81 y.o. female who is being seen today for the evaluation of AF at the request of [name], Dinah C, NP. Presenting today for electrophysiology evaluation.  The history is noted for atrial fibrillation and hyperlipidemia.  She has had atrial fibrillation for the last few years.  She had a cardioversion April 2021.  She presented to cardiology clinic feeling more short of breath with palpitations and was noted to be in atrial fibrillation.  She has had multiple cardioversions.  Today, denies symptoms of palpitations, chest pain, shortness of breath, orthopnea, PND, lower extremity edema, claudication, dizziness, presyncope, syncope, bleeding, or neurologic sequela. The patient is tolerating medications without difficulties.  She continues to feel weakness and fatigue due to her atrial fibrillation.  She Shelia [name] likely back into normal rhythm.   Past Medical History:  Diagnosis Date   Anxiety    Arthritis    "left knee; left hip; lower back; mild to moderate in right hip" (09/30/2016)   Atrial fibrillation (HCC)    Chronic lower back pain    Gastritis    Heart palpitations    History of bone density study 2018   History of mammogram 2021   History of MRI 2018   History of Papanicolaou smear of cervix    Hypercholesteremia    Osteopenia    Seasonal allergies    Past Surgical History:  Procedure Laterality Date   CARDIOVERSION N/A 11/17/2019   Procedure: CARDIOVERSION;  Surgeon: [name], MD;  Location: [place];  Service: Cardiovascular;  Laterality: N/A;   CARDIOVERSION N/A 04/16/2021   Procedure: CARDIOVERSION;  Surgeon: Skeet Latch, MD;  Location: [place];   Service: Cardiovascular;  Laterality: N/A;   CARDIOVERSION N/A 08/15/2021   Procedure: CARDIOVERSION;  Surgeon: Janina Mayo, MD;  Location: [facility] ENDOSCOPY;  Service: Cardiovascular;  Laterality: N/A;   COLONOSCOPY     DILATION AND CURETTAGE OF UTERUS     JOINT REPLACEMENT     TONSILLECTOMY     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   Procedure: TOTAL HIP ARTHROPLASTY ANTERIOR APPROACH;  Surgeon: Dorna Leitz, MD;  Location: [place];  Service: Orthopedics;  Laterality: Left;     Current Outpatient Medications  Medication Sig Dispense Refill   amoxicillin (AMOXIL) 500 MG capsule Take 4 capsules by mouth 1 hour prior to appointment. 4 capsule 0   apixaban (ELIQUIS) 5 MG TABS tablet TAKE 1 TABLET BY MOUTH TWICE DAILY 180 tablet 0   Brimonidine Tartrate (LUMIFY) 0.025 % SOLN Place 1 drop into both eyes daily as needed (red eyes).     Carboxymeth-Glyc-Polysorb PF (REFRESH OPTIVE MEGA-3) 0.5-1-0.5 % SOLN Place 1 drop into both eyes 4 (four) times daily as needed (dry eyes).     ketotifen (ZADITOR) 0.025 % ophthalmic solution Place 1 drop into both eyes 2 (two) times daily as needed (itchy eyes).     loratadine (CLARITIN) 10 MG tablet Take 10 mg by mouth daily as needed for allergies.     melatonin 5 MG TABS Take 5 mg by mouth at bedtime. Sometimes patient Shelia [name] take a 3 mg tablet     NON FORMULARY Take 2 tablets by mouth daily. BONE-UP  Omega-3 Fatty Acids (FISH OIL PO) Take 3 capsules by mouth daily.     rosuvastatin (CRESTOR) 20 MG tablet Take 1 tablet (20 mg total) by mouth daily. 90 tablet 1   Simethicone (GAS-X PO) Take 1 capsule by mouth as needed (gas).     Throat Lozenges (COUGH DROPS MT) Use as directed in the mouth or throat as needed (Patient uses as a sweetner).     No current facility-administered medications for this visit.    Allergies:   Nsaids and Prednisone   Social History:  The patient  reports that she has quit smoking. Her smoking use included cigarettes. She smoked an  average of 2 packs per day. She has never used smokeless tobacco. She reports that she does not currently use alcohol. She reports that she does not use drugs.   Family History:  The patient's family history includes Anxiety disorder in her daughter; Cancer in her father; Diabetes in her sister; Hypertension in her mother.    ROS:  Please see the history of present illness.   Otherwise, review of systems is positive for none.   All other systems are reviewed and negative.   PHYSICAL EXAM: VS:  BP 102/60   Pulse 79   Ht '5\' 7"'$  (1.702 m)   Wt 132 lb (59.9 kg)   SpO2 98%   BMI 20.67 kg/m  , BMI Body mass index is 20.67 kg/m. GEN: Well nourished, well developed, in no acute distress  HEENT: normal  Neck: no JVD, carotid bruits, or masses Cardiac: irregular; no murmurs, rubs, or gallops,no edema  Respiratory:  clear to auscultation bilaterally, normal work of breathing GI: soft, nontender, nondistended, + BS MS: no deformity or atrophy  Skin: warm and dry Neuro:  Strength and sensation are intact Psych: euthymic mood, full affect  EKG:  EKG is ordered today. Personal review of the ekg ordered shows atrial fibrillation  Recent Labs: 12/02/2021: ALT 37; BUN 25; Creat 0.91; Hemoglobin 14.1; Platelets 128; Potassium 3.9; Sodium 142; TSH 3.28    Lipid Panel     Component Value Date/Time   CHOL 114 12/02/2021 1009   CHOL 127 07/25/2021 0907   TRIG 53 12/02/2021 1009   HDL 60 12/02/2021 1009   HDL 54 07/25/2021 0907   CHOLHDL 1.9 12/02/2021 1009   LDLCALC 41 12/02/2021 1009     Wt Readings from Last 3 Encounters:  02/18/22 132 lb (59.9 kg)  02/17/22 134 lb 3.2 oz (60.9 kg)  02/10/22 132 lb (59.9 kg)      Other studies Reviewed: Additional studies/ records that were reviewed today include: TTE 10/31/19  Review of the above records today demonstrates:   1. Left ventricular ejection fraction, by estimation, is 60 to 65%. The  left ventricle has normal function. The left  ventricle has no regional  wall motion abnormalities. Left ventricular diastolic parameters are  indeterminate.   2. Right ventricular systolic function is normal. The right ventricular  size is normal. There is normal pulmonary artery systolic pressure.   3. Left atrial size was moderately dilated.   4. The mitral valve is normal in structure. Mild mitral valve  regurgitation. No evidence of mitral stenosis.   5. The aortic valve is normal in structure. Aortic valve regurgitation is  trivial. No aortic stenosis is present.   6. The inferior vena cava is normal in size with greater than 50%  respiratory variability, suggesting right atrial pressure of 3 mmHg.   Coronary CT 04/18/2021 1. Coronary  calcium score of 192 (LAD). This was 60 percentile for age and sex matched control. 2. Normal coronary origin with Left dominance. 3. There is proximal LAD calcified plaque with 25-49% stenosis. Sending for FFR analysis. 4.  Aortic atherosclerosis.  ASSESSMENT AND PLAN:  1.  Persistent atrial fibrillation: Currently on Eliquis 5 mg twice daily.  CHA2DS2-VASc of 3.  She Shelia [name] get back into normal rhythm.  She would prefer to avoid antiarrhythmic medications.  Due to that, we Shelia [name] plan for ablation.  Risk and benefits were discussed.  She understands the risks and is agreed to the procedure.  Risk, benefits, and alternatives to EP study and radiofrequency ablation for afib were also discussed in detail today. These risks include but are not limited to stroke, bleeding, vascular damage, tamponade, perforation, damage to the esophagus, lungs, and other structures, pulmonary vein stenosis, worsening renal function, and death. The patient understands these risk and wishes to proceed.  We Shelia [name] therefore proceed with catheter ablation at the next available time.  Carto, ICE, anesthesia are requested for the procedure.  Shelia [name] also obtain CT PV protocol prior to the procedure to exclude LAA thrombus and further  evaluate atrial anatomy.   2.  Hyperlipidemia: Continue fish oil per primary cardiology  3.  Secondary hypercoagulable state: Currently on Eliquis for atrial fibrillation as above  Current medicines are reviewed at length with the patient today.   The patient does not have concerns regarding her medicines.  The following changes were made today:  none  Labs/ tests ordered today include:  Orders Placed This Encounter  Procedures   CT CARDIAC MORPH/PULM VEIN W/CM&W/O CA SCORE   Basic metabolic panel   CBC   EKG 12-Lead     Disposition:   FU with Shelia [name] 3 months  Signed, Shelia Fuchs Meredith Leeds, MD  02/18/2022 11:50 AM     [facility] [address] [place] [address] [phone] (office) [phone] (fax)

## 2022-02-26 NOTE — Progress Notes (Signed)
[facility] PREP Weekly Session  Patient Details  Name: Shelia [name] MRN: [id number] Date of Birth: 1941-02-15 Age: 81 y.o. PCP: Sandrea Hughs, NP  Vitals:   02/24/22 1030  Weight: 132 lb (59.9 kg)     [facility] Weekly seesion - 02/26/22 1000       [facility] "PREP" Location   [facility] "PREP" Location [facility]      Weekly Session   Topic Discussed --   portion   Minutes exercised this week 255 minutes    Classes attended to date [place] 02/26/2022, 10:04 AM

## 2022-03-04 NOTE — Progress Notes (Signed)
[facility] PREP Weekly Session  Patient Details  Name: Shelia [name] MRN: [id number] Date of Birth: January 15, 1941 Age: 81 y.o. PCP: Sandrea Hughs, NP  Vitals:   03/03/22 1030  Weight: 131 lb 9.6 oz (59.7 kg)     [facility] Weekly seesion - 03/04/22 1500       [facility] "PREP" Location   [facility] "PREP" Location [facility]      Weekly Session   Topic Discussed Finding support    Minutes exercised this week 355 minutes    Classes attended to date 41             [place] 03/04/2022, 4:00 PM

## 2022-03-11 ENCOUNTER — Other Ambulatory Visit: Payer: Self-pay

## 2022-03-11 ENCOUNTER — Inpatient Hospital Stay: Payer: PPO | Attending: Hematology and Oncology

## 2022-03-11 DIAGNOSIS — R7989 Other specified abnormal findings of blood chemistry: Secondary | ICD-10-CM | POA: Diagnosis not present

## 2022-03-11 DIAGNOSIS — D696 Thrombocytopenia, unspecified: Secondary | ICD-10-CM | POA: Diagnosis not present

## 2022-03-11 DIAGNOSIS — Z87891 Personal history of nicotine dependence: Secondary | ICD-10-CM | POA: Diagnosis not present

## 2022-03-11 DIAGNOSIS — R748 Abnormal levels of other serum enzymes: Secondary | ICD-10-CM

## 2022-03-11 DIAGNOSIS — D72819 Decreased white blood cell count, unspecified: Secondary | ICD-10-CM | POA: Insufficient documentation

## 2022-03-11 DIAGNOSIS — Z7901 Long term (current) use of anticoagulants: Secondary | ICD-10-CM | POA: Diagnosis not present

## 2022-03-11 LAB — CMP (CANCER CENTER ONLY)
ALT: 34 U/L (ref 0–44)
AST: 35 U/L (ref 15–41)
Albumin: 3.9 g/dL (ref 3.5–5.0)
Alkaline Phosphatase: 54 U/L (ref 38–126)
Anion gap: 6 (ref 5–15)
BUN: <5 mg/dL — ABNORMAL LOW (ref 8–23)
CO2: 27 mmol/L (ref 22–32)
Calcium: 9.4 mg/dL (ref 8.9–10.3)
Chloride: 109 mmol/L (ref 98–111)
Creatinine: 0.71 mg/dL (ref 0.44–1.00)
GFR, Estimated: >60 mL/min (ref >60)
Glucose, Bld: 72 mg/dL (ref 70–99)
Potassium: 4.1 mmol/L (ref 3.5–5.1)
Sodium: 142 mmol/L (ref 135–145)
Total Bilirubin: 0.2 mg/dL — ABNORMAL LOW (ref 0.3–1.2)
Total Protein: 5.1 g/dL — ABNORMAL LOW (ref 6.5–8.1)

## 2022-03-11 LAB — CBC WITH DIFFERENTIAL (CANCER CENTER ONLY)
Abs Immature Granulocytes: 0.01 10*3/uL (ref 0.00–0.07)
Basophils Absolute: 0 10*3/uL (ref 0.0–0.1)
Basophils Relative: 1 %
Eosinophils Absolute: 0.1 10*3/uL (ref 0.0–0.5)
Eosinophils Relative: 3 %
HCT: 42.9 % (ref 36.0–46.0)
Hemoglobin: 15.1 g/dL — ABNORMAL HIGH (ref 12.0–15.0)
Immature Granulocytes: 0 %
Lymphocytes Relative: 37 %
Lymphs Abs: 1.4 10*3/uL (ref 0.7–4.0)
MCH: 33.9 pg (ref 26.0–34.0)
MCHC: 35.2 g/dL (ref 30.0–36.0)
MCV: 96.4 fL (ref 80.0–100.0)
Monocytes Absolute: 0.6 10*3/uL (ref 0.1–1.0)
Monocytes Relative: 15 %
Neutro Abs: 1.6 10*3/uL — ABNORMAL LOW (ref 1.7–7.7)
Neutrophils Relative %: 44 %
Platelet Count: 134 10*3/uL — ABNORMAL LOW (ref 150–400)
RBC: 4.45 MIL/uL (ref 3.87–5.11)
RDW: 11.4 % — ABNORMAL LOW (ref 11.5–15.5)
WBC Count: 3.7 10*3/uL — ABNORMAL LOW (ref 4.0–10.5)
nRBC: 0 % (ref 0.0–0.2)

## 2022-03-11 NOTE — Progress Notes (Signed)
[facility] PREP Weekly Session  Patient Details  Name: NADALEE NEISWENDER MRN: [id number] Date of Birth: 1941-04-27 Age: 81 y.o. PCP: Sandrea Hughs, NP  Vitals:   03/10/22 1030  Weight: 132 lb (59.9 kg)     [facility] Weekly seesion - 03/11/22 1500       [facility] "PREP" Location   [facility] "PREP" Location [facility]      Weekly Session   Topic Discussed Calorie breakdown    Minutes exercised this week 320 minutes    Classes attended to date 17             Barnett Hatter 03/11/2022, 3:58 PM

## 2022-03-13 LAB — VITAMIN B1: Vitamin B1 (Thiamine): 151.2 nmol/L (ref 66.5–200.0)

## 2022-03-15 LAB — METHYLMALONIC ACID, SERUM: Methylmalonic Acid, Quantitative: 165 nmol/L (ref 0–378)

## 2022-03-16 LAB — COPPER, SERUM: Copper: 101 ug/dL (ref 80–158)

## 2022-03-18 ENCOUNTER — Other Ambulatory Visit: Payer: Self-pay

## 2022-03-18 ENCOUNTER — Encounter: Payer: Self-pay | Admitting: Hematology and Oncology

## 2022-03-18 ENCOUNTER — Inpatient Hospital Stay: Payer: PPO | Admitting: Hematology and Oncology

## 2022-03-18 DIAGNOSIS — D696 Thrombocytopenia, unspecified: Secondary | ICD-10-CM

## 2022-03-18 DIAGNOSIS — D72819 Decreased white blood cell count, unspecified: Secondary | ICD-10-CM

## 2022-03-18 NOTE — Assessment & Plan Note (Signed)
The cause is unknown but I suspect she might have component of liver disease given history of chronic alcohol use as well as dietary choices of food due to her preferred selection for vegetarian based diet Her liver enzymes have been abnormal in the past but has now normalized Her platelet count fluctuate up and down but overall stable There is no contraindication to remain on antiplatelet agents or anticoagulants as long as the platelet is greater than 50,000. I plan to see her again in a year for further follow-up

## 2022-03-18 NOTE — Assessment & Plan Note (Signed)
She has intermittent leukopenia for the past 5 years, cause unknown but could be related to past history of alcohol use and trace mineral deficiencies She is not symptomatic Observe only

## 2022-03-18 NOTE — Progress Notes (Signed)
Shelia [name] OFFICE PROGRESS NOTE  Shelia [name], Shelia Bucks, NP  ASSESSMENT & PLAN:  Thrombocytopenia ([place]) The cause is unknown but I suspect she might have component of liver disease given history of chronic alcohol use as well as dietary choices of food due to her preferred selection for vegetarian based diet Her liver enzymes have been abnormal in the past but has now normalized Her platelet count fluctuate up and down but overall stable There is no contraindication to remain on antiplatelet agents or anticoagulants as long as the platelet is greater than 50,000. I plan to see her again in a year for further follow-up   Leukopenia She has intermittent leukopenia for the past 5 years, cause unknown but could be related to past history of alcohol use and trace mineral deficiencies She is not symptomatic Observe only   Orders Placed This Encounter  Procedures   CBC with Differential ([place] Only)    Standing Status:   Future    Standing Expiration Date:   03/19/2023   CMP ([place] only)    Standing Status:   Future    Standing Expiration Date:   03/19/2023    The total time spent in the appointment was 20 minutes encounter with patients including review of chart and various tests results, discussions about plan of care and coordination of care plan   All questions were answered. The patient knows to call the clinic with any problems, questions or concerns. No barriers to learning was detected.    Shelia Lark, MD 8/22/202310:23 AM  INTERVAL HISTORY: Shelia [name] 81 y.o. female returns for further follow-up and review of test results Since last time I saw her, she is attempting to eat high-protein diet and was able to get approximately 80 to 90 g of protein per day She is exercising on a regular basis No recent bleeding or infection  SUMMARY OF HEMATOLOGIC HISTORY: Shelia [name] is here because of recent abnormal findings of CBC I have the  opportunity to review her CBC dated back to 2018 On 09/22/2016, her white blood cell count was 3.6, hemoglobin 13.5 and platelet count of 198 Most recently, the patient was admitted for cardioversion and was noted to have leukopenia and thrombocytopenia She was also noted to have grossly abnormal LFTs Overall, over the last 5 years, her white blood cell count ranged from 2.9-7.3 and platelet count ranged from 132-1 98 The patient have history of alcoholism with regular drinks at least 3 to 5 days a 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  Her last antibiotic therapy was more than 6 months The patient denies alcohol intake since end of 2022  I have reviewed the past medical history, past surgical history, social history and family history with the patient and they are unchanged from previous note.  ALLERGIES:  is allergic to nsaids and prednisone.  MEDICATIONS:  Current Outpatient Medications  Medication Sig Dispense Refill   amoxicillin (AMOXIL) 500 MG capsule Take 4 capsules by mouth 1 hour prior to appointment. 4 capsule 0   apixaban (ELIQUIS) 5 MG TABS tablet TAKE 1 TABLET BY MOUTH TWICE DAILY 180 tablet 0   Brimonidine Tartrate (LUMIFY) 0.025 % SOLN Place 1 drop into both eyes daily as needed (red eyes).  Carboxymeth-Glyc-Polysorb PF (REFRESH OPTIVE MEGA-3) 0.5-1-0.5 % SOLN Place 1 drop into both eyes 4 (four) times daily as needed (dry eyes).     ketotifen (ZADITOR) 0.025 % ophthalmic solution Place 1 drop into both eyes 2 (two) times daily as needed (itchy eyes).     loratadine (CLARITIN) 10 MG tablet Take 10 mg by mouth daily as needed for allergies.     melatonin 5 MG TABS Take 5 mg by mouth at bedtime. Sometimes patient will take a 3 mg tablet     NON FORMULARY Take 2 tablets by mouth  daily. BONE-UP     Omega-3 Fatty Acids (FISH OIL PO) Take 3 capsules by mouth daily.     rosuvastatin (CRESTOR) 20 MG tablet Take 1 tablet (20 mg total) by mouth daily. 90 tablet 1   Simethicone (GAS-X PO) Take 1 capsule by mouth as needed (gas).     Throat Lozenges (COUGH DROPS MT) Use as directed in the mouth or throat as needed (Patient uses as a sweetner).     No current facility-administered medications for this visit.     REVIEW OF SYSTEMS:   Constitutional: Denies fevers, chills or night sweats Eyes: Denies blurriness of vision Ears, nose, mouth, throat, and face: Denies mucositis or sore throat Respiratory: Denies cough, dyspnea or wheezes Cardiovascular: Denies palpitation, chest discomfort or lower extremity swelling Gastrointestinal:  Denies nausea, heartburn or change in bowel habits Skin: Denies abnormal skin rashes Lymphatics: Denies new lymphadenopathy or easy bruising Neurological:Denies numbness, tingling or new weaknesses Behavioral/Psych: Mood is stable, no new changes  All other systems were reviewed with the patient and are negative.  PHYSICAL EXAMINATION: ECOG PERFORMANCE STATUS: 0 - Asymptomatic  Vitals:   03/18/22 0843  BP: (!) 99/52  Pulse: 84  Resp: 18  Temp: 97.6 F (36.4 C)  SpO2: 100%   Filed Weights   03/18/22 0843  Weight: 135 lb 6.4 oz (61.4 kg)    GENERAL:alert, no distress and comfortable Musculoskeletal:no cyanosis of digits and no clubbing  NEURO: alert & oriented x 3 with fluent speech, no focal motor/sensory deficits  LABORATORY DATA:  I have reviewed the data as listed     Component Value Date/Time   NA 142 03/11/2022 1002   NA 144 08/13/2021 0955   K 4.1 03/11/2022 1002   CL 109 03/11/2022 1002   CO2 27 03/11/2022 1002   GLUCOSE 72 03/11/2022 1002   BUN <5 (L) 03/11/2022 1002   BUN 26 08/13/2021 0955   CREATININE 0.71 03/11/2022 1002   CREATININE 0.91 12/02/2021 1009   CALCIUM 9.4 03/11/2022 1002   PROT 5.1 (L)  03/11/2022 1002   PROT 5.8 (L) 07/25/2021 0907   ALBUMIN 3.9 03/11/2022 1002   ALBUMIN 4.5 07/25/2021 0907   AST 35 03/11/2022 1002   ALT 34 03/11/2022 1002   ALKPHOS 54 03/11/2022 1002   BILITOT 0.2 (L) 03/11/2022 1002   GFRNONAA >60 03/11/2022 1002   GFRNONAA 79 12/03/2020 1153   GFRAA 91 12/03/2020 1153    No results found for: "SPEP", "UPEP"  Lab Results  Component Value Date   WBC 3.7 (L) 03/11/2022   NEUTROABS 1.6 (L) 03/11/2022   HGB 15.1 (H) 03/11/2022   HCT 42.9 03/11/2022   MCV 96.4 03/11/2022   PLT 134 (L) 03/11/2022      Chemistry      Component Value Date/Time   NA 142 03/11/2022 1002   NA 144 08/13/2021 0955   K 4.1 03/11/2022 1002   CL  109 03/11/2022 1002   CO2 27 03/11/2022 1002   BUN <5 (L) 03/11/2022 1002   BUN 26 08/13/2021 0955   CREATININE 0.71 03/11/2022 1002   CREATININE 0.91 12/02/2021 1009      Component Value Date/Time   CALCIUM 9.4 03/11/2022 1002   ALKPHOS 54 03/11/2022 1002   AST 35 03/11/2022 1002   ALT 34 03/11/2022 1002   BILITOT 0.2 (L) 03/11/2022 1002

## 2022-03-26 NOTE — Progress Notes (Signed)
[facility] PRE[facility] Evaluation  Patient Details  Name: AMY GOTHARD MRN: [id number] Date of Birth: 06-17-1941 Age: 81 y.o. PCP: [name], Nelda Bucks, NP  Vitals:   03/25/22 1600  BP: 108/70  Pulse: 63  SpO2: 98%  Weight: 135 lb 9.6 oz (61.5 kg)     [facility] Eval - 03/26/22 0900       Referral    Program Start Date --   Last class 03/24/22     Measurement   Waist Circumference 30.5 inches    Hip Circumference 35 inches    Body fat 38 percent      Information for Trainer   Goals reset      Mobility and Daily Activities   I find it easy to walk up or down two or more flights of stairs. 3    I have no trouble taking out the trash. 4    I do housework such as vacuuming and dusting on my own without difficulty. 4    I can easily lift a gallon of milk (8lbs). 4    I can easily walk a mile. 4    I have no trouble reaching into high cupboards or reaching down to pick up something from the floor. 4    I do not have trouble doing out-door work such as Armed forces logistics/support/administrative officer, raking leaves, or gardening. 4      Mobility and Daily Activities   I feel younger than my age. 4    I feel independent. 3    I feel energetic. 3    I live an active life.  3    I feel strong. 2    I feel healthy. 3    I feel active as other people my age. 4      How fit and strong are you.   Fit and Strong Total Score 49            Past Medical History:  Diagnosis Date   Anxiety    Arthritis    "left knee; left hip; lower back; mild to moderate in right hip" (09/30/2016)   Atrial fibrillation (HCC)    Chronic lower back pain    Gastritis    Heart palpitations    History of bone density study 2018   History of mammogram 2021   History of MRI 2018   History of Papanicolaou smear of cervix    Hypercholesteremia    Osteopenia    Seasonal allergies    Past Surgical History:  Procedure Laterality Date   CARDIOVERSION N/A 11/17/2019   Procedure: CARDIOVERSION;  Surgeon: [name], MD;  Location: [place];  Service: Cardiovascular;  Laterality: N/A;   CARDIOVERSION N/A 04/16/2021   Procedure: CARDIOVERSION;  Surgeon: Skeet Latch, MD;  Location: [place];  Service: Cardiovascular;  Laterality: N/A;   CARDIOVERSION N/A 08/15/2021   Procedure: CARDIOVERSION;  Surgeon: Janina Mayo, MD;  Location: [facility] ENDOSCOPY;  Service: Cardiovascular;  Laterality: N/A;   COLONOSCOPY     DILATION AND CURETTAGE OF UTERUS     JOINT REPLACEMENT     TONSILLECTOMY     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 PCP: [name], Nelda Bucks, NP  Vitals:   03/25/22 1600  BP: 108/70  Pulse: 63  SpO2: 98%  Weight: 135 lb 9.6 oz (61.5 kg)     [facility] Weekly seesion - 03/26/22 0900       [facility] Location   [facility]      Weekly Session   Topic Discussed Hitting roadblocks    Minutes exercised this week 110 minutes   recorded on 03/24/22   Classes attended to date 50             Final Fit Testing: Cardio march test: 250 to 248 Sit to stand: 11 to 13 Bicep curl: 20 to 22 Still unbalanced with single leg test Encouraged to continue to do balance testing  Talked with pt on ways to get more protein in as requested Keeps detailed notes. Sts remains full doesn't always want to eat. Encouraged to do small meals instead more frequently. May want to try a protein shake. Expressed concerns over gaining weight. BMI is well within normal encouraged to build her muscle mass to maintain mobility and that will help her maintain weight.       Barnett Hatter 03/26/2022, 9:53 AM

## 2022-04-10 ENCOUNTER — Encounter: Payer: Self-pay | Admitting: *Deleted

## 2022-04-10 ENCOUNTER — Telehealth: Payer: Self-pay | Admitting: *Deleted

## 2022-04-10 DIAGNOSIS — I480 Paroxysmal atrial fibrillation: Secondary | ICD-10-CM

## 2022-04-10 MED ORDER — APIXABAN 2.5 MG PO TABS: 2.5000 mg | ORAL_TABLET | Freq: Two times a day (BID) | ORAL | Status: DC

## 2022-04-10 NOTE — Telephone Encounter (Signed)
Left message for pt to call, dr Stanford Breed reviewed her lab work from the [facility] and due to her age and weight, we need to reduce her eliquis dosage to 2.5 mg twice daily. She can take 1/2 of the 5 mg tablet twice daily until she runs out and we will send a 2.5 mg prescription to her pharmacy. Her kidney function is fine. Also sent patient a my chart message regarding this.

## 2022-04-10 NOTE — Telephone Encounter (Signed)
Received my chart message from patient that she received my message and understands medication change.

## 2022-04-11 DIAGNOSIS — Z1231 Encounter for screening mammogram for malignant neoplasm of breast: Secondary | ICD-10-CM | POA: Diagnosis not present

## 2022-04-17 ENCOUNTER — Telehealth (INDEPENDENT_AMBULATORY_CARE_PROVIDER_SITE_OTHER): Payer: PPO | Admitting: Nurse Practitioner

## 2022-04-17 ENCOUNTER — Telehealth: Payer: Self-pay

## 2022-04-17 DIAGNOSIS — U071 COVID-19: Secondary | ICD-10-CM | POA: Diagnosis not present

## 2022-04-17 NOTE — Progress Notes (Signed)
Careteam: Patient Care Team: [name], Nelda Bucks, NP as PCP - General (Family Medicine) Stanford Breed Denice Bors, MD as PCP - Cardiology (Cardiology) Constance Haw, MD as PCP - Electrophysiology (Cardiology) Molli Posey, MD as Consulting Physician (Obstetrics and Gynecology) Stanford Breed Denice Bors, MD as Consulting Physician (Cardiology) Stephannie Li, OD (Ophthalmology)  Advanced Directive information    Allergies  Allergen Reactions   Nsaids Other (See Comments)    Caused painful stomach problems   Prednisone Other (See Comments)    Caused painful stomach problems    Chief Complaint  Patient presents with   Acute Visit    Patient tested positive for COVID. Patient tested positive yesterday. Symptoms started early yesterday morning. Sneezing, coughing. Patient has no fever. Patient's son tested positive Wednesday, also and they were together on Sunday. She feels tight chested.      HPI: Patient is a 81 y.o. female due to positive COVID Better today than yesterday.  Took some tylenol which has helped.  Has taken airborne.  Had cough overnight 2 nights ago but better now.  Yesterday was bad, achy and fatigue.  No nausea, vomiting or diarrhea.    Review of Systems:  Review of Systems  Constitutional:  Positive for malaise/fatigue. Negative for chills, fever and weight loss.  HENT:  Positive for congestion. Negative for tinnitus.   Respiratory:  Negative for cough, sputum production and shortness of breath.   Cardiovascular:  Negative for chest pain, palpitations and leg swelling.  Gastrointestinal:  Negative for abdominal pain, constipation, diarrhea and heartburn.  Genitourinary:  Negative for dysuria, frequency and urgency.  Musculoskeletal:  Negative for back pain, falls, joint pain and myalgias.  Skin: Negative.   Neurological:  Negative for dizziness and headaches.  Psychiatric/Behavioral:  Negative for depression and memory loss. The patient does not have  insomnia.     Past Medical History:  Diagnosis Date   Anxiety    Arthritis    "left knee; left hip; lower back; mild to moderate in right hip" (09/30/2016)   Atrial fibrillation (HCC)    Chronic lower back pain    Gastritis    Heart palpitations    History of bone density study 2018   History of mammogram 2021   History of MRI 2018   History of Papanicolaou smear of cervix    Hypercholesteremia    Osteopenia    Seasonal allergies    Past Surgical History:  Procedure Laterality Date   CARDIOVERSION N/A 11/17/2019   Procedure: CARDIOVERSION;  Surgeon: [name], MD;  Location: [place];  Service: Cardiovascular;  Laterality: N/A;   CARDIOVERSION N/A 04/16/2021   Procedure: CARDIOVERSION;  Surgeon: Skeet Latch, MD;  Location: [place];  Service: Cardiovascular;  Laterality: N/A;   CARDIOVERSION N/A 08/15/2021   Procedure: CARDIOVERSION;  Surgeon: Janina Mayo, MD;  Location[facility] ENDOSCOPY;  Service: Cardiovascular;  Laterality: N/A;   COLONOSCOPY     DILATION AND CURETTAGE OF UTERUS     JOINT REPLACEMENT     TONSILLECTOMY     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 Her smoking use included cigarettes. She smoked an average of 2 packs per day. She has never used smokeless tobacco. She reports that she does not currently use alcohol. She reports that she does not use drugs.  Family History  Problem Relation Age of Onset   Hypertension Mother    Cancer Father        lung cancer   Diabetes Sister    Anxiety disorder Daughter    Breast cancer Neg Hx     Medications: Patient's Medications  New Prescriptions   No medications on file  Previous Medications   AMOXICILLIN (AMOXIL) 500 MG CAPSULE    Take 4 capsules by mouth 1 hour prior to appointment.   APIXABAN  (ELIQUIS) 2.5 MG TABS TABLET    Take 1 tablet (2.5 mg total) by mouth 2 (two) times daily.   BRIMONIDINE TARTRATE (LUMIFY) 0.025 % SOLN    Place 1 drop into both eyes daily as needed (red eyes).   CARBOXYMETH-GLYC-POLYSORB PF (REFRESH OPTIVE MEGA-3) 0.5-1-0.5 % SOLN    Place 1 drop into both eyes 4 (four) times daily as needed (dry eyes).   KETOTIFEN (ZADITOR) 0.025 % OPHTHALMIC SOLUTION    Place 1 drop into both eyes 2 (two) times daily as needed (itchy eyes).   LORATADINE (CLARITIN) 10 MG TABLET    Take 10 mg by mouth daily as needed for allergies.   MELATONIN 5 MG TABS    Take 5 mg by mouth at bedtime. 1/2 tablet   NON FORMULARY    Take 1 tablet by mouth daily. Plant calcium(1000 mg)   OMEGA-3 FATTY ACIDS (FISH OIL PO)    Take 3 capsules by mouth daily.   ROSUVASTATIN (CRESTOR) 20 MG TABLET    Take 1 tablet (20 mg total) by mouth daily.   SIMETHICONE (GAS-X PO)    Take 1 capsule by mouth as needed (gas).   THROAT LOZENGES (COUGH DROPS MT)    Use as directed in the mouth or throat as needed (Patient uses as a sweetner).  Modified Medications   No medications on file  Discontinued Medications   No medications on file    Physical Exam:  There were no vitals filed for this visit. There is no height or weight on file to calculate BMI. Wt Readings from Last 3 Encounters:  03/25/22 135 lb 9.6 oz (61.5 kg)  03/18/22 135 lb 6.4 oz (61.4 kg)  03/10/22 132 lb (59.9 kg)    Physical Exam Constitutional:      Appearance: Normal appearance.  Pulmonary:     Effort: Pulmonary effort is normal.  Neurological:     Mental Status: She is alert. Mental status is at baseline.  Psychiatric:        Mood and Affect: Mood normal.     Labs reviewed: Basic Metabolic Panel: Recent Labs    09/13/21 1322 12/02/21 1009 03/11/22 1002  NA 142 142 142  K 3.8 3.9 4.1  CL 105 106 109  CO2 33* 29 27  GLUCOSE 75 99 72  BUN 25* 25 <5*  CREATININE 0.82 0.91 0.71  CALCIUM 9.6 9.2 9.4  TSH  --  3.28  --     Liver Function Tests: Recent Labs    07/25/21 0907 08/15/21 1603 09/13/21 1322 12/02/21 1009 03/11/22 1002  AST 45*   < > 44* 42* 35  ALT 31   < > 33 37* 34  ALKPHOS 54  --  48  --  54  BILITOT 0.5   < > 0.8 0.8 0.2*  PROT 5.8*   < > 6.6 5.9* 5.1*  ALBUMIN 4.5  --  4.4  --  3.9   < > = values in this interval not displayed.  No results for input(s): "LIPASE", "AMYLASE" in the last 8760 hours. No results for input(s): "AMMONIA" in the last 8760 hours. CBC: Recent Labs    09/13/21 1322 12/02/21 1009 03/11/22 1002  WBC 4.7 3.0* 3.7*  NEUTROABS 2.5 1,431* 1.6*  HGB 14.1 14.1 15.1*  HCT 42.1 42.4 42.9  MCV 99.8 100.0 96.4  PLT 142* 128* 134*   Lipid Panel: Recent Labs    05/28/21 0946 07/25/21 0907 12/02/21 1009  CHOL 192 127 114  HDL 59 54 60  LDLCALC 124* 62 41  TRIG 47 46 53  CHOLHDL 3.3 2.4 1.9   TSH: Recent Labs    12/02/21 1009  TSH 3.28   A1C: No results found for: "HGBA1C"   Assessment/Plan 1. COVID -doing better today, to continue supportive care.  Netipot or saline wash daily Plain nasal saline spray throughout the day as needed May use tylenol 325 mg 2 tablets every 6-8 hours as needed aches and pains or sore throat  Keep well hydrated Proper nutrition  Avoid forcefully blowing nose Vit c 1000 mg daily Vit d 2000 units daily  Your test for COVID-19 was positive, meaning that you were infected with the coronavirus and could give the germ to others.  Please continue isolation at home for at least 5 days since the start of your symptoms. On day 5, as long as your symptoms are improving and you are not having a fever, you can return to normal activities, ensuring you are wearing a mask at all times when not at home.  Once you complete your 10 days you are good to resume normal acitivities.  -to notify if symptoms worsen Janett Billow K. Harle Battiest  [facility] [phone]    Virtual Visit via video  I connected  with patient on 04/17/22 at 10:00 AM EDT by mychart video and verified that I am speaking with the correct person using two identifiers.  Location: Patient: home Provider: [facility]    I discussed the limitations, risks, security and privacy concerns of performing an evaluation and management service by telephone and the availability of in person appointments. I also discussed with the patient that there may be a patient responsible charge related to this service. The patient expressed understanding and agreed to proceed.   I discussed the assessment and treatment plan with the patient. The patient was provided an opportunity to ask questions and all were answered. The patient agreed with the plan and demonstrated an understanding of the instructions.   The patient was advised to call back or seek an in-person evaluation if the symptoms worsen or if the condition fails to improve as anticipated.  I provided 15 minutes of non-face-to-face time during this encounter.  Carlos American. Harle Battiest Avs printed and mailed

## 2022-04-17 NOTE — Patient Instructions (Signed)
Your test for COVID-19 was positive, meaning that you were infected with the coronavirus and could give the germ to others.  Please continue isolation at home for at least 5 days since the start of your symptoms. On day 5, as long as your symptoms are improving and you are not having a fever, you can return to normal activities, ensuring you are wearing a mask at all times when not at home.     Once you complete your 10 days you are good to resume normal acitivities.    Continue supportive care with tylenol, vitamins and do not blow nose forcefully

## 2022-04-17 NOTE — Telephone Encounter (Signed)
Ms. shann, lewellyn are scheduled for a virtual visit with your provider today.    Just as we do with appointments in the office, we must obtain your consent to participate.  Your consent will be active for this visit and any virtual visit you may have with one of our providers in the next 365 days.    If you have a MyChart account, I can also send a copy of this consent to you electronically.  All virtual visits are billed to your insurance company just like a traditional visit in the office.  As this is a virtual visit, video technology does not allow for your provider to perform a traditional examination.  This may limit your provider's ability to fully assess your condition.  If your provider identifies any concerns that need to be evaluated in person or the need to arrange testing such as labs, EKG, etc, we will make arrangements to do so.    Although advances in technology are sophisticated, we cannot ensure that it will always work on either your end or our end.  If the connection with a video visit is poor, we may have to switch to a telephone visit.  With either a video or telephone visit, we are not always able to ensure that we have a secure connection.   I need to obtain your verbal consent now.   Are you willing to proceed with your visit today?   GARRY BOCHICCHIO has provided verbal consent on 04/17/2022 for a virtual visit (video or telephone).   Carroll Kinds, CMA 04/17/2022  10:07 AM

## 2022-04-17 NOTE — Progress Notes (Signed)
This service is provided via telemedicine  No vital signs collected/recorded due to the encounter was a telemedicine visit.   Location of patient (ex: home, work):  Home  Patient consents to a telephone visit:  Yes, see encounter dated 04/17/2022  Location of the provider (ex: office, home):  [place]  Name of any referring provider:  [name], Dinah, NP  Names of all persons participating in the telemedicine service and their role in the encounter:  Sherrie Mustache, Nurse Practitioner, Carroll Kinds, CMA, and patient.   Time spent on call:  9 minutes with medical assistant

## 2022-04-28 ENCOUNTER — Ambulatory Visit (INDEPENDENT_AMBULATORY_CARE_PROVIDER_SITE_OTHER): Payer: PPO | Admitting: Family

## 2022-04-28 ENCOUNTER — Encounter: Payer: Self-pay | Admitting: Family

## 2022-04-28 VITALS — BP 100/68 | HR 72 | Temp 96.7°F | Resp 16 | Ht 67.0 in | Wt 135.8 lb

## 2022-04-28 DIAGNOSIS — Z0000 Encounter for general adult medical examination without abnormal findings: Secondary | ICD-10-CM

## 2022-04-28 NOTE — Progress Notes (Signed)
Subjective:   Shelia [name] is a 81 y.o. female who presents for Medicare Annual (Subsequent) preventive examination.  Review of Systems     Cardiac Risk Factors include: advanced age (>77mn, >>79women);hypertension;smoking/ tobacco exposure     Objective:    Today's Vitals   04/28/22 1524 04/28/22 1543  BP: 100/68   Pulse: 72   Resp: 16   Temp: (!) 96.7 F (35.9 C)   SpO2: 98%   Weight: 135 lb 12.8 oz (61.6 kg)   Height: '5\' 7"'$  (1.702 m)   PainSc:  4    Body mass index is 21.27 kg/m.     04/28/2022    3:38 PM 12/04/2021    2:06 PM 08/15/2021    3:12 PM 08/15/2021    7:53 AM 04/23/2021   10:00 AM 04/16/2021    8:53 AM 12/03/2020   10:14 AM  Advanced Directives  Does Patient Have a Medical Advance Directive? Yes Yes Yes Yes Yes Yes Yes  Type of [place] will Out of facility DNR (pink MOST or yellow form);Healthcare Power of [place] will Healthcare Power of [place] Does patient want to make changes to medical advance directive? No - Patient declined No - Patient declined No - Patient declined  No - Patient declined  No - Patient declined  Copy of [place] Chart? Yes - validated most recent copy scanned in chart (See row information) Yes - validated most recent copy scanned in chart (See row information) Yes - validated most recent copy scanned in chart (See row information) No - copy requested Yes - validated most recent copy scanned in chart (See row information) Yes - validated most recent copy scanned in chart (See row information) Yes - validated most recent copy scanned in chart (See row information)  Pre-existing out of facility DNR order (yellow form or pink MOST form)  Pink MOST form placed in chart (order not valid for inpatient use)         Current Medications  (verified) Outpatient Encounter Medications as of 04/28/2022  Medication Sig   amoxicillin (AMOXIL) 500 MG capsule Take 4 capsules by mouth 1 hour prior to appointment.   apixaban (ELIQUIS) 2.5 MG TABS tablet Take 1 tablet (2.5 mg total) by mouth 2 (two) times daily.   Brimonidine Tartrate (LUMIFY) 0.025 % SOLN Place 1 drop into both eyes daily as needed (red eyes).   Carboxymeth-Glyc-Polysorb PF (REFRESH OPTIVE MEGA-3) 0.5-1-0.5 % SOLN Place 1 drop into both eyes 4 (four) times daily as needed (dry eyes).   ketotifen (ZADITOR) 0.025 % ophthalmic solution Place 1 drop into both eyes 2 (two) times daily as needed (itchy eyes).   loratadine (CLARITIN) 10 MG tablet Take 10 mg by mouth daily as needed for allergies.   melatonin 5 MG TABS Take 5 mg by mouth at bedtime. 1/2 tablet   NON FORMULARY Take 1 tablet by mouth daily. Plant calcium(1000 mg)   Omega-3 Fatty Acids (FISH OIL PO) Take 3 capsules by mouth daily.   rosuvastatin (CRESTOR) 20 MG tablet Take 1 tablet (20 mg total) by mouth daily.   Simethicone (GAS-X PO) Take 1 capsule by mouth as needed (gas).   Throat Lozenges (COUGH DROPS MT) Use as directed in the mouth or throat as needed (Patient uses as a sweetner).   No facility-administered encounter medications on file as  of 04/28/2022.    Allergies (verified) Nsaids and Prednisone   History: Past Medical History:  Diagnosis Date   Anxiety    Arthritis    "left knee; left hip; lower back; mild to moderate in right hip" (09/30/2016)   Atrial fibrillation (HCC)    Chronic lower back pain    Gastritis    Heart palpitations    History of bone density study 2018   History of mammogram 2021   History of MRI 2018   History of Papanicolaou smear of cervix    Hypercholesteremia    Osteopenia    Seasonal allergies    Past Surgical History:  Procedure Laterality Date   CARDIOVERSION N/A 11/17/2019   Procedure: CARDIOVERSION;  Surgeon: [name], MD;  Location: [facility];   Service: Cardiovascular;  Laterality: N/A;   CARDIOVERSION N/A 04/16/2021   Procedure: CARDIOVERSION;  Surgeon: Skeet Latch, MD;  Location: [facility];  Service: Cardiovascular;  Laterality: N/A;   CARDIOVERSION N/A 08/15/2021   Procedure: CARDIOVERSION;  Surgeon: Janina Mayo, MD;  Location: [facility] ENDOSCOPY;  Service: Cardiovascular;  Laterality: N/A;   COLONOSCOPY     DILATION AND CURETTAGE OF UTERUS     JOINT REPLACEMENT     TONSILLECTOMY     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   Procedure: TOTAL HIP ARTHROPLASTY ANTERIOR APPROACH;  Surgeon: Dorna Leitz, MD;  Location: [facility];  Service: Orthopedics;  Laterality: Left;   Family History  Problem Relation Age of Onset   Hypertension Mother    Cancer Father        lung cancer   Diabetes Sister    Anxiety disorder Daughter    Breast cancer Neg Hx    Social History   Socioeconomic History   Marital status: Married    Spouse name: Not on file   Number of children: 3   Years of education: Not on file   Highest education level: Not on file  Occupational History   Not on file  Tobacco Use   Smoking status: Former    Packs/day: 2.00    Types: Cigarettes   Smokeless tobacco: Never   Tobacco comments:    "quit smoking mid-late 1980s"  Vaping Use   Vaping Use: Never used  Substance and Sexual Activity   Alcohol use: Not Currently   Drug use: Never   Sexual activity: Yes  Other Topics Concern   Not on file  Social History Narrative   Tobacco use, amount per day now: None.   Past tobacco use, amount per day: Maximum 2 packs    How many years did you use tobacco: Quit 1985   Alcohol use (drinks per week): 3   Diet: Vegetables, Chicken, Fish, Grains, and Fruit.   Do you drink/eat things with caffeine: Coffee   Marital status: Married                                  What year were you married? 1969   Do you live in a house, apartment, assisted living, condo, trailer, etc.? House.   Is it one or more stories? 2 stories.    How many persons live in your home? 2   Do you have pets in your home?( please list) No.   Highest Level of education completed? Masters in Adult Educations.   Current or past profession: Regulatory affairs officer    Do you exercise? Yes.  Type and how often? Floor excercises 5 times week. Walk 2 miles 6 times week.   Do you have a living will? Yes   Do you have a DNR form?  Yes                                 If not, do you want to discuss one?   Do you have signed POA/HPOA forms?  Yes                      If so, please bring to you appointment      Do you have any difficulty bathing or dressing yourself? No   Do you have any difficulty preparing food or eating? No   Do you have any difficulty managing your medications? No   Do you have any difficulty managing your finances? No   Do you have any difficulty affording your medications? No   Social Determinants of Radio broadcast assistant Strain: Not on file  Food Insecurity: Not on file  Transportation Needs: Not on file  Physical Activity: Not on file  Stress: Not on file  Social Connections: Not on file    Tobacco Counseling Counseling given: Not Answered Tobacco comments: "quit smoking mid-late 1980s"   Clinical Intake:  Pre-visit preparation completed: No  Pain : 0-10 Pain Score: 4  Pain Type: Chronic pain Pain Location: Back Pain Orientation: Lower Pain Descriptors / Indicators: Aching Pain Onset: More than a month ago (several years) Pain Frequency: Constant Pain Relieving Factors: Tlyenol ,rest Effect of Pain on Daily Activities: None  Pain Relieving Factors: Tlyenol ,rest  BMI - recorded: 21.27 Nutritional Status: BMI of 19-24  Normal Nutritional Risks: None Diabetes: No  How often do you need to have someone help you when you read instructions, pamphlets, or other written materials from your doctor or pharmacy?: 1 - Never What is the last grade level you completed in school?:  Masters degree  Diabetic?No   Interpreter Needed?: No      Activities of Daily Living    04/28/2022    3:52 PM  In your present state of health, do you have any difficulty performing the following activities:  Hearing? 0  Vision? 0  Difficulty concentrating or making decisions? 1  Comment remembering  Walking or climbing stairs? 0  Dressing or bathing? 0  Doing errands, shopping? 0  Preparing Food and eating ? N  Comment takes mediction with [place]  Using the Toilet? N  In the past six months, have you accidently leaked urine? Y  Do you have problems with loss of bowel control? N  Comment occasionally loose bowels  Managing your Medications? N  Managing your Finances? N  Housekeeping or managing your Housekeeping? N    Patient Care Team: Kadance Mccuistion, Nelda Bucks, NP as PCP - General (Family Medicine) Stanford Breed Denice Bors, MD as PCP - Cardiology (Cardiology) Constance Haw, MD as PCP - Electrophysiology (Cardiology) Molli Posey, MD as Consulting Physician (Obstetrics and Gynecology) Stanford Breed Denice Bors, MD as Consulting Physician (Cardiology) Stephannie Li, OD (Ophthalmology)  Indicate any recent Medical Services you may have received from other than [facility] providers in the past year (date may be approximate).     Assessment:   This is a routine wellness examination for Mahitha.  Hearing/Vision screen Hearing Screening - Comments:: No Hearing Concerns.  Vision Screening - Comments:: No Vision Concerns. Patient wears prescription  glasses. Patient last eye exam May 2023.  Dietary issues and exercise activities discussed: Current Exercise Habits: Home exercise routine, Type of exercise: strength training/weights;walking (squats), Time (Minutes): 60, Frequency (Times/Week): 4, Weekly Exercise (Minutes/Week): 240, Intensity: Moderate, Exercise limited by: None identified   Goals Addressed             This Visit's Progress    Patient Stated   On track    Reduce  Alcohol intake       Depression Screen    04/28/2022    3:32 PM 12/04/2021    3:01 PM 04/23/2021    9:56 AM  PHQ 2/9 Scores  PHQ - 2 Score 0 0 0    Fall Risk    04/28/2022    3:32 PM 12/04/2021    3:01 PM 04/23/2021    9:58 AM 12/03/2020   10:14 AM 11/15/2020    1:21 PM  [place] in the past year? 0 0 0 0 0  Number falls in past yr: 0 0 0 0 0  Injury with Fall? 0 0 0 0 0  Risk for fall due to : No Fall Risks No Fall Risks No Fall Risks    Follow up Falls evaluation completed Falls evaluation completed Falls evaluation completed      [place]:  Any stairs in or around the home? Yes  If so, are there any without handrails? No  Home free of loose throw rugs in walkways, pet beds, electrical cords, etc? No  Adequate lighting in your home to reduce risk of falls? Yes   ASSISTIVE DEVICES UTILIZED TO PREVENT FALLS:  Life alert? No  Use of a cane, walker or w/c? No  Grab bars in the bathroom? No  Shower chair or bench in shower? No  Elevated toilet seat or a handicapped toilet? No   TIMED UP AND GO:  Was the test performed? Yes .  Length of time to ambulate 10 feet: 4 sec.   Gait steady and fast without use of assistive device  Cognitive Function:    04/28/2022    3:33 PM 12/03/2020   10:27 AM  MMSE - Mini Mental State Exam  Orientation to time 5 5  Orientation to time comments year 2023, season Fall, date 2nd, day Monday, month October.   Orientation to Place 5 5  Orientation to Place-comments state [place], [place], facility [employer], provider [phone].   Registration 3 3  Registration-comments apple, table, penny.   Attention/ Calculation 5 5  Recall 2 3  Recall-comments apple, penny, N/A   Language- name 2 objects 2 2  Language- repeat 1 1  Language- follow 3 step command 3 2  Language- read & follow direction 1 1  Write a sentence 1 1  Copy design 0 1  Total score 28 29         04/23/2021   10:00 AM  6CIT Screen  What Year? 0 points  What month? 0 points  What time? 0 points  Count back from 20 0 points  Months in reverse 0 points  Repeat phrase 0 points  Total Score 0 points    Immunizations Immunization History  Administered Date(s) Administered   Influenza Split 04/30/2015, 04/20/2016, 04/27/2018, 06/16/2019   Influenza, High Dose Seasonal PF 06/28/2017, 06/16/2019, 06/02/2020   Influenza-Unspecified 06/19/2021   PFIZER(Purple Top)SARS-COV-2 Vaccination 08/12/2019, 09/02/2019, 05/01/2020, 11/13/2020, 04/12/2021   Pneumococcal Conjugate-13 09/30/2013   Pneumococcal Polysaccharide-23  07/27/2003, 09/24/2010   Td 04/26/2001, 12/25/2017   Tdap 09/24/2010   Zoster Recombinat (Shingrix) 11/16/2017, 01/06/2018   Zoster, Live 12/11/2006, 04/27/2018, 06/29/2018    TDAP status: Up to date  Flu Vaccine status: Due, Education has been provided regarding the importance of this vaccine. Advised may receive this vaccine at local pharmacy or Health Dept. Aware to provide a copy of the vaccination record if obtained from local pharmacy or Health Dept. Verbalized acceptance and understanding.  Pneumococcal vaccine status: Up to date  Covid-19 vaccine status: Information provided on how to obtain vaccines.   Qualifies for Shingles Vaccine? Yes   Zostavax completed Yes   Shingrix Completed?: Yes  Screening Tests Health Maintenance  Topic Date Due   COVID-19 Vaccine (6 - Pfizer risk series) 06/07/2021   INFLUENZA VACCINE  02/25/2022   TETANUS/TDAP  12/26/2027   Pneumonia Vaccine 19+ Years old  Completed   DEXA SCAN  Completed   Zoster Vaccines- Shingrix  Completed   HPV VACCINES  Aged Out    Health Maintenance  Health Maintenance Due  Topic Date Due   COVID-19 Vaccine (6 - Pfizer risk series) 06/07/2021   INFLUENZA VACCINE  02/25/2022    Colorectal cancer screening: No longer required.   Mammogram status: No longer required due to due to age  .  Bone Density status: Completed 11/30/2020. Results reflect: Bone density results: NORMAL. Repeat every 5 years.  Lung Cancer Screening: (Low Dose CT Chest recommended if Age 64-80 years, 30 pack-year currently smoking OR have quit w/in 15years.) does not qualify.   Lung Cancer Screening Referral: No   Additional Screening:  Hepatitis C Screening: does not qualify; Completed No   Vision Screening: Recommended annual ophthalmology exams for early detection of glaucoma and other disorders of the eye. Is the patient up to date with their annual eye exam?  Yes  Who is the provider or what is the name of the office in which the patient attends annual eye exams? Dr.Angel Legrande  If pt is not established with a provider, would they like to be referred to a provider to establish care? No .   Dental Screening: Recommended annual dental exams for proper oral hygiene  Community Resource Referral / Chronic Care Management: CRR required this visit?  No   CCM required this visit?  No      Plan:     I have personally reviewed and noted the following in the patient's chart:   Medical and social history Use of alcohol, tobacco or illicit drugs  Current medications and supplements including opioid prescriptions. Patient is not currently taking opioid prescriptions. Functional ability and status Nutritional status Physical activity Advanced directives List of other physicians Hospitalizations, surgeries, and ER visits in previous 12 months Vitals Screenings to include cognitive, depression, and falls Referrals and appointments  In addition, I have reviewed and discussed with patient certain preventive protocols, quality metrics, and best practice recommendations. A written personalized care plan for preventive services as well as general preventive health recommendations were provided to patient.     Sandrea Hughs, NP   04/28/2022   Nurse Notes: Would like to get Influenza vaccine  and COVID-19 booster vaccine

## 2022-04-28 NOTE — Patient Instructions (Signed)
Shelia [name] , Thank you for taking time to come for your Medicare Wellness Visit. I appreciate your ongoing commitment to your health goals. Please review the following plan we discussed and let me know if I can assist you in the future.   Screening recommendations/referrals: Colonoscopy : N/A  Mammogram N/A  Bone Density : up to date  Recommended yearly ophthalmology/optometry visit for glaucoma screening and checkup Recommended yearly dental visit for hygiene and checkup  Vaccinations: Influenza vaccine- due annually in September/October Pneumococcal vaccine  : up to date  Tdap vaccine  : up to date  Shingles vaccine  : up to date     Advanced directives: Yes   Conditions/risks identified: Cardiac Risk Factors include: advanced age (>69mn, >>51women);hypertension;smoking/ tobacco exposure  Next appointment: 1 year    Preventive Care 617Years and Older, Female Preventive care refers to lifestyle choices and visits with your health care provider that can promote health and wellness. What does preventive care include? A yearly physical exam. This is also called an annual well check. Dental exams once or twice a year. Routine eye exams. Ask your health care provider how often you should have your eyes checked. Personal lifestyle choices, including: Daily care of your teeth and gums. Regular physical activity. Eating a healthy diet. Avoiding tobacco and drug use. Limiting alcohol use. Practicing safe sex. Taking low-dose aspirin every day. Taking vitamin and mineral supplements as recommended by your health care provider. What happens during an annual well check? The services and screenings done by your health care provider during your annual well check will depend on your age, overall health, lifestyle risk factors, and family history of disease. Counseling  Your health care provider may ask you questions about your: Alcohol use. Tobacco use. Drug use. Emotional  well-being. Home and relationship well-being. Sexual activity. Eating habits. History of falls. Memory and ability to understand (cognition). Work and work eStatistician Reproductive health. Screening  You may have the following tests or measurements: Height, weight, and BMI. Blood pressure. Lipid and cholesterol levels. These may be checked every 5 years, or more frequently if you are over 539years old. Skin check. Lung cancer screening. You may have this screening every year starting at age 89if you have a 30-pack-year history of smoking and currently smoke or have quit within the past 15 years. Fecal occult blood test (FOBT) of the stool. You may have this test every year starting at age 80 Flexible sigmoidoscopy or colonoscopy. You may have a sigmoidoscopy every 5 years or a colonoscopy every 10 years starting at age 80 Hepatitis C blood test. Hepatitis B blood test. Sexually transmitted disease (STD) testing. Diabetes screening. This is done by checking your blood sugar (glucose) after you have not eaten for a while (fasting). You may have this done every 1-3 years. Bone density scan. This is done to screen for osteoporosis. You may have this done starting at age 10554 Mammogram. This may be done every 1-2 years. Talk to your health care provider about how often you should have regular mammograms. Talk with your health care provider about your test results, treatment options, and if necessary, the need for more tests. Vaccines  Your health care provider may recommend certain vaccines, such as: Influenza vaccine. This is recommended every year. Tetanus, diphtheria, and acellular pertussis (Tdap, Td) vaccine. You may need a Td booster every 10 years. Zoster vaccine. You may need this after age 10589 Pneumococcal 13-valent conjugate (PCV13) vaccine. One dose is  recommended after age 54. Pneumococcal polysaccharide (PPSV23) vaccine. One dose is recommended after age 81. Talk to your  health care provider about which screenings and vaccines you need and how often you need them. This information is not intended to replace advice given to you by your health care provider. Make sure you discuss any questions you have with your health care provider. Document Released: 08/10/2015 Document Revised: 04/02/2016 Document Reviewed: 05/15/2015 Elsevier Interactive Patient Education  2017 [place] Prevention in the Home Falls can cause injuries. They can happen to people of all ages. There are many things you can do to make your home safe and to help prevent falls. What can I do on the outside of my home? Regularly fix the edges of walkways and driveways and fix any cracks. Remove anything that might make you trip as you walk through a door, such as a raised step or threshold. Trim any bushes or trees on the path to your home. Use bright outdoor lighting. Clear any walking paths of anything that might make someone trip, such as rocks or tools. Regularly check to see if handrails are loose or broken. Make sure that both sides of any steps have handrails. Any raised decks and porches should have guardrails on the edges. Have any leaves, snow, or ice cleared regularly. Use sand or salt on walking paths during winter. Clean up any spills in your garage right away. This includes oil or grease spills. What can I do in the bathroom? Use night lights. Install grab bars by the toilet and in the tub and shower. Do not use towel bars as grab bars. Use non-skid mats or decals in the tub or shower. If you need to sit down in the shower, use a plastic, non-slip stool. Keep the floor dry. Clean up any water that spills on the floor as soon as it happens. Remove soap buildup in the tub or shower regularly. Attach bath mats securely with double-sided non-slip rug tape. Do not have throw rugs and other things on the floor that can make you trip. What can I do in the bedroom? Use night  lights. Make sure that you have a light by your bed that is easy to reach. Do not use any sheets or blankets that are too big for your bed. They should not hang down onto the floor. Have a firm chair that has side arms. You can use this for support while you get dressed. Do not have throw rugs and other things on the floor that can make you trip. What can I do in the kitchen? Clean up any spills right away. Avoid walking on wet floors. Keep items that you use a lot in easy-to-reach places. If you need to reach something above you, use a strong step stool that has a grab bar. Keep electrical cords out of the way. Do not use floor polish or wax that makes floors slippery. If you must use wax, use non-skid floor wax. Do not have throw rugs and other things on the floor that can make you trip. What can I do with my stairs? Do not leave any items on the stairs. Make sure that there are handrails on both sides of the stairs and use them. Fix handrails that are broken or loose. Make sure that handrails are as long as the stairways. Check any carpeting to make sure that it is firmly attached to the stairs. Fix any carpet that is loose or worn. Avoid having  throw rugs at the top or bottom of the stairs. If you do have throw rugs, attach them to the floor with carpet tape. Make sure that you have a light switch at the top of the stairs and the bottom of the stairs. If you do not have them, ask someone to add them for you. What else can I do to help prevent falls? Wear shoes that: Do not have high heels. Have rubber bottoms. Are comfortable and fit you well. Are closed at the toe. Do not wear sandals. If you use a stepladder: Make sure that it is fully opened. Do not climb a closed stepladder. Make sure that both sides of the stepladder are locked into place. Ask someone to hold it for you, if possible. Clearly mark and make sure that you can see: Any grab bars or handrails. First and last  steps. Where the edge of each step is. Use tools that help you move around (mobility aids) if they are needed. These include: Canes. Walkers. Scooters. Crutches. Turn on the lights when you go into a dark area. Replace any light bulbs as soon as they burn out. Set up your furniture so you have a clear path. Avoid moving your furniture around. If any of your floors are uneven, fix them. If there are any pets around you, be aware of where they are. Review your medicines with your doctor. Some medicines can make you feel dizzy. This can increase your chance of falling. Ask your doctor what other things that you can do to help prevent falls. This information is not intended to replace advice given to you by your health care provider. Make sure you discuss any questions you have with your health care provider. Document Released: 05/10/2009 Document Revised: 12/20/2015 Document Reviewed: 08/18/2014 Elsevier Interactive Patient Education  2017 [employer].

## 2022-05-14 ENCOUNTER — Other Ambulatory Visit: Payer: Self-pay | Admitting: Cardiology

## 2022-05-14 ENCOUNTER — Other Ambulatory Visit (HOSPITAL_COMMUNITY): Payer: Self-pay

## 2022-05-14 MED ORDER — ROSUVASTATIN CALCIUM 20 MG PO TABS
20.0000 mg | ORAL_TABLET | Freq: Every day | ORAL | 1 refills | Status: DC
  Filled 2022-05-14: qty 90, 90d supply, fill #0

## 2022-05-20 ENCOUNTER — Ambulatory Visit: Payer: PPO | Attending: Cardiology

## 2022-05-20 DIAGNOSIS — Z01812 Encounter for preprocedural laboratory examination: Secondary | ICD-10-CM

## 2022-05-20 DIAGNOSIS — I4819 Other persistent atrial fibrillation: Secondary | ICD-10-CM

## 2022-05-21 LAB — BASIC METABOLIC PANEL
BUN/Creatinine Ratio: 24 (ref 12–28)
BUN: 22 mg/dL (ref 8–27)
CO2: 26 mmol/L (ref 20–29)
Calcium: 9.3 mg/dL (ref 8.7–10.3)
Chloride: 106 mmol/L (ref 96–106)
Creatinine, Ser: 0.92 mg/dL (ref 0.57–1.00)
Glucose: 84 mg/dL (ref 70–99)
Potassium: 4.6 mmol/L (ref 3.5–5.2)
Sodium: 145 mmol/L — ABNORMAL HIGH (ref 134–144)
eGFR: 63 mL/min/{1.73_m2} (ref >59)

## 2022-05-21 LAB — CBC
Hematocrit: 42.9 % (ref 34.0–46.6)
Hemoglobin: 14.3 g/dL (ref 11.1–15.9)
MCH: 32.8 pg (ref 26.6–33.0)
MCHC: 33.3 g/dL (ref 31.5–35.7)
MCV: 98 fL — ABNORMAL HIGH (ref 79–97)
Platelets: 140 10*3/uL — ABNORMAL LOW (ref 150–450)
RBC: 4.36 x10E6/uL (ref 3.77–5.28)
RDW: 11.9 % (ref 11.7–15.4)
WBC: 3.3 10*3/uL — ABNORMAL LOW (ref 3.4–10.8)

## 2022-05-31 ENCOUNTER — Other Ambulatory Visit (HOSPITAL_COMMUNITY): Payer: Self-pay

## 2022-05-31 ENCOUNTER — Other Ambulatory Visit: Payer: Self-pay | Admitting: Cardiology

## 2022-05-31 DIAGNOSIS — I480 Paroxysmal atrial fibrillation: Secondary | ICD-10-CM

## 2022-06-02 ENCOUNTER — Other Ambulatory Visit (HOSPITAL_COMMUNITY): Payer: Self-pay

## 2022-06-02 ENCOUNTER — Telehealth (HOSPITAL_COMMUNITY): Payer: Self-pay | Admitting: *Deleted

## 2022-06-02 MED ORDER — APIXABAN 2.5 MG PO TABS
2.5000 mg | ORAL_TABLET | Freq: Two times a day (BID) | ORAL | 5 refills | Status: DC
  Filled 2022-06-02: qty 60, 30d supply, fill #0
  Filled 2022-07-09: qty 60, 30d supply, fill #1

## 2022-06-02 NOTE — Telephone Encounter (Signed)
Prescription refill request for Eliquis received. Indication: AF Last office visit: 02/18/22  Elliot Cousin MD Scr: 0.92 on 05/20/22 Age: 81 Weight: 59.9kg  Based on above findings Eliquis 2.'5mg'$  twice daily is the appropriate dose.  Refill approved.

## 2022-06-02 NOTE — Telephone Encounter (Signed)
Reaching out to patient to offer assistance regarding upcoming cardiac imaging study; pt verbalizes understanding of appt date/time, parking situation and where to check in, and verified current allergies; name and call back number provided for further questions should they arise  Dale Strausser RN Navigator Cardiac Imaging [facility] [phone] office [phone] cell  

## 2022-06-03 ENCOUNTER — Ambulatory Visit (HOSPITAL_BASED_OUTPATIENT_CLINIC_OR_DEPARTMENT_OTHER)
Admission: RE | Admit: 2022-06-03 | Discharge: 2022-06-03 | Disposition: A | Discharge status: Home / Self Care | Payer: PPO | Source: Ambulatory Visit | Attending: Cardiology | Admitting: Cardiology

## 2022-06-03 ENCOUNTER — Other Ambulatory Visit: Payer: PPO

## 2022-06-03 ENCOUNTER — Ambulatory Visit (INDEPENDENT_AMBULATORY_CARE_PROVIDER_SITE_OTHER): Payer: PPO

## 2022-06-03 DIAGNOSIS — D696 Thrombocytopenia, unspecified: Secondary | ICD-10-CM

## 2022-06-03 DIAGNOSIS — D709 Neutropenia, unspecified: Secondary | ICD-10-CM

## 2022-06-03 DIAGNOSIS — E785 Hyperlipidemia, unspecified: Secondary | ICD-10-CM

## 2022-06-03 DIAGNOSIS — I4819 Other persistent atrial fibrillation: Secondary | ICD-10-CM | POA: Insufficient documentation

## 2022-06-03 DIAGNOSIS — Z23 Encounter for immunization: Secondary | ICD-10-CM

## 2022-06-03 LAB — LIPID PANEL
Cholesterol: 126 mg/dL (ref <200)
HDL: 64 mg/dL (ref >=50)
LDL Cholesterol (Calc): 47 mg/dL (calc)
Non-HDL Cholesterol (Calc): 62 mg/dL (calc) (ref <130)
Total CHOL/HDL Ratio: 2 (calc) (ref <5.0)
Triglycerides: 66 mg/dL (ref <150)

## 2022-06-03 LAB — COMPLETE METABOLIC PANEL WITH GFR
AG Ratio: 2.8 (calc) — ABNORMAL HIGH (ref 1.0–2.5)
ALT: 42 U/L — ABNORMAL HIGH (ref 6–29)
AST: 48 U/L — ABNORMAL HIGH (ref 10–35)
Albumin: 4.5 g/dL (ref 3.6–5.1)
Alkaline phosphatase (APISO): 61 U/L (ref 37–153)
BUN: 25 mg/dL (ref 7–25)
CO2: 30 mmol/L (ref 20–32)
Calcium: 9.4 mg/dL (ref 8.6–10.4)
Chloride: 104 mmol/L (ref 98–110)
Creat: 0.82 mg/dL (ref 0.60–0.95)
Globulin: 1.6 g/dL (calc) — ABNORMAL LOW (ref 1.9–3.7)
Glucose, Bld: 75 mg/dL (ref 65–99)
Potassium: 4.2 mmol/L (ref 3.5–5.3)
Sodium: 141 mmol/L (ref 135–146)
Total Bilirubin: 1 mg/dL (ref 0.2–1.2)
Total Protein: 6.1 g/dL (ref 6.1–8.1)
eGFR: 72 mL/min/{1.73_m2} (ref >=60)

## 2022-06-03 LAB — CBC WITH DIFFERENTIAL/PLATELET
Absolute Monocytes: 418 cells/uL (ref 200–950)
Basophils Absolute: 20 cells/uL (ref 0–200)
Basophils Relative: 0.6 %
Eosinophils Absolute: 160 cells/uL (ref 15–500)
Eosinophils Relative: 4.7 %
HCT: 43.5 % (ref 35.0–45.0)
Hemoglobin: 14.7 g/dL (ref 11.7–15.5)
Lymphs Abs: 1465 cells/uL (ref 850–3900)
MCH: 33.3 pg — ABNORMAL HIGH (ref 27.0–33.0)
MCHC: 33.8 g/dL (ref 32.0–36.0)
MCV: 98.4 fL (ref 80.0–100.0)
MPV: 10.8 fL (ref 7.5–12.5)
Monocytes Relative: 12.3 %
Neutro Abs: 1336 cells/uL — ABNORMAL LOW (ref 1500–7800)
Neutrophils Relative %: 39.3 %
Platelets: 136 10*3/uL — ABNORMAL LOW (ref 140–400)
RBC: 4.42 10*6/uL (ref 3.80–5.10)
RDW: 11.5 % (ref 11.0–15.0)
Total Lymphocyte: 43.1 %
WBC: 3.4 10*3/uL — ABNORMAL LOW (ref 3.8–10.8)

## 2022-06-03 MED ORDER — IOHEXOL 350 MG/ML SOLN
80.0000 mL | Freq: Once | INTRAVENOUS | Status: AC | PRN
  Administered 2022-06-03: 80 mL via INTRAVENOUS

## 2022-06-03 NOTE — Progress Notes (Signed)
HPI: Follow-up atrial fibrillation. Cardiac CTA September 2022 showed calcium score 192 which was 63rd percentile, 25 to 49% proximal LAD which had negative FFR. Echocardiogram repeated March 2023 and showed normal LV function, moderate left atrial enlargement, moderate right atrial enlargement, mild mitral regurgitation, mild to moderate tricuspid regurgitation, mild aortic insufficiency. Patient had successful cardioversion 4/21, 9/22 and 1/23. Cardiac CTA November 2023 showed mild biatrial enlargement, small PFO, calcium score 140 which is 55th percentile.  Patient underwent atrial fibrillation ablation 06/10/22.  Since last seen the patient has dyspnea with more extreme activities but not with routine activities. It is relieved with rest. It is not associated with chest pain. There is no orthopnea, PND or pedal edema. There is no syncope or palpitations. There is no exertional chest pain.   Current Outpatient Medications  Medication Sig Dispense Refill   acetaminophen (TYLENOL) 325 MG tablet Take 325 mg by mouth every 6 (six) hours as needed for moderate pain or mild pain.     amoxicillin (AMOXIL) 500 MG capsule Take 4 capsules by mouth 1 hour prior to appointment. 4 capsule 0   apixaban (ELIQUIS) 2.5 MG TABS tablet Take 1 tablet (2.5 mg total) by mouth 2 (two) times daily. 60 tablet 5   Brimonidine Tartrate (LUMIFY) 0.025 % SOLN Place 1 drop into both eyes 2 (two) times daily. Red eyes     Carboxymeth-Glyc-Polysorb PF (REFRESH OPTIVE MEGA-3) 0.5-1-0.5 % SOLN Place 1 drop into both eyes 4 (four) times daily as needed (dry eyes). Enhance with flax seed oil     cetirizine (ZYRTEC) 10 MG tablet Take 10 mg by mouth daily as needed for allergies.     diphenhydramine-acetaminophen (TYLENOL PM) 25-500 MG TABS tablet Take 1 tablet by mouth at bedtime as needed (Pain/sleep).     ketotifen (ZADITOR) 0.025 % ophthalmic solution Place 1 drop into both eyes 3 (three) times a week. In the fall      loratadine (CLARITIN) 10 MG tablet Take 10 mg by mouth daily as needed for allergies.     melatonin 3 MG TABS tablet Take 3 mg by mouth at bedtime. 1/2 tablet     Multiple Vitamins-Minerals (MULTIVITAMIN WITH MINERALS) tablet Take 2 tablets by mouth daily. Gummy     NON FORMULARY Take 1 tablet by mouth daily. Plant calcium(1000 mg)     Omega-3 Fatty Acids (FISH OIL PO) Take 3 capsules by mouth daily. 2400 mg  1680 EPA TG DHA 550 RTG     OVER THE COUNTER MEDICATION Place 1 application  into both eyes daily. Pure & Clean     rosuvastatin (CRESTOR) 20 MG tablet Take 1 tablet (20 mg total) by mouth daily. 90 tablet 1   Simethicone (GAS-X PO) Take 1 capsule by mouth daily as needed (gas).     No current facility-administered medications for this visit.     Past Medical History:  Diagnosis Date   Anxiety    Arthritis    "left knee; left hip; lower back; mild to moderate in right hip" (09/30/2016)   Atrial fibrillation (HCC)    Chronic lower back pain    Gastritis    Heart palpitations    History of bone density study 2018   History of mammogram 2021   History of MRI 2018   History of Papanicolaou smear of cervix    Hypercholesteremia    Osteopenia    Seasonal allergies     Past Surgical History:  Procedure Laterality Date  ATRIAL FIBRILLATION ABLATION N/A 06/10/2022   Procedure: ATRIAL FIBRILLATION ABLATION;  Surgeon: Constance Haw, MD;  Location: [place] [facility];  Service: Cardiovascular;  Laterality: N/A;   CARDIOVERSION N/A 11/17/2019   Procedure: CARDIOVERSION;  Surgeon: [name], MD;  Location: [facility] ENDOSCOPY;  Service: Cardiovascular;  Laterality: N/A;   CARDIOVERSION N/A 04/16/2021   Procedure: CARDIOVERSION;  Surgeon: Skeet Latch, MD;  Location: [place];  Service: Cardiovascular;  Laterality: N/A;   CARDIOVERSION N/A 08/15/2021   Procedure: CARDIOVERSION;  Surgeon: Janina Mayo, MD;  Location: [facility] ENDOSCOPY;  Service: Cardiovascular;  Laterality:  N/A;   COLONOSCOPY     DILATION AND CURETTAGE OF UTERUS     JOINT REPLACEMENT     TONSILLECTOMY     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   Procedure: TOTAL HIP ARTHROPLASTY ANTERIOR APPROACH;  Surgeon: Dorna Leitz, MD;  Location: [place];  Service: Orthopedics;  Laterality: Left;    Social History   Socioeconomic History   Marital status: Married    Spouse name: Not on file   Number of children: 3   Years of education: Not on file   Highest education level: Not on file  Occupational History   Not on file  Tobacco Use   Smoking status: Former    Packs/day: 2.00    Types: Cigarettes   Smokeless tobacco: Never   Tobacco comments:    "quit smoking mid-late 1980s"  Vaping Use   Vaping Use: Never used  Substance and Sexual Activity   Alcohol use: Not Currently   Drug use: Never   Sexual activity: Yes  Other Topics Concern   Not on file  Social History Narrative   Tobacco use, amount per day now: None.   Past tobacco use, amount per day: Maximum 2 packs    How many years did you use tobacco: Quit 1985   Alcohol use (drinks per week): 3   Diet: Vegetables, Chicken, Fish, Grains, and Fruit.   Do you drink/eat things with caffeine: Coffee   Marital status: Married                                  What year were you married? 1969   Do you live in a house, apartment, assisted living, condo, trailer, etc.? House.   Is it one or more stories? 2 stories.   How many persons live in your home? 2   Do you have pets in your home?( please list) No.   Highest Level of education completed? Masters in Adult Educations.   Current or past profession: Regulatory affairs officer    Do you exercise? Yes.                                  Type and how often? Floor excercises 5 times week. Walk 2 miles 6 times week.   Do you have a living will? Yes   Do you have a DNR form?  Yes                                 If not, do you want to discuss one?   Do you have signed POA/HPOA forms?  Yes  If so, please bring to you appointment      Do you have any difficulty bathing or dressing yourself? No   Do you have any difficulty preparing food or eating? No   Do you have any difficulty managing your medications? No   Do you have any difficulty managing your finances? No   Do you have any difficulty affording your medications? No   Social Determinants of Radio broadcast assistant Strain: Not on file  Food Insecurity: Not on file  Transportation Needs: Not on file  Physical Activity: Not on file  Stress: Not on file  Social Connections: Not on file  Intimate Partner Violence: Not on file    Family History  Problem Relation Age of Onset   Hypertension Mother    Cancer Father        lung cancer   Diabetes Sister    Anxiety disorder Daughter    Breast cancer Neg Hx     ROS: no fevers or chills, productive cough, hemoptysis, dysphasia, odynophagia, melena, hematochezia, dysuria, hematuria, rash, seizure activity, orthopnea, PND, pedal edema, claudication. Remaining systems are negative.  Physical Exam: Well-developed well-nourished in no acute distress.  Skin is warm and dry.  HEENT is normal.  Neck is supple.  Chest is clear to auscultation with normal expansion.  Cardiovascular exam is regular rate and rhythm.  Abdominal exam nontender or distended. No masses palpated. Extremities show no edema. neuro grossly intact   A/P  1 paroxysmal atrial fibrillation-S/P ablation; remains in sinus. Continue apixaban at present dose.  Note her weight is 63.3 kg which has increased recently.  She has borderline for 2.5 or 5 mg.  I will continue 2.5 mg for now but if her weight continues to be above 60 kg we will increase to 5 mg.  2 coronary artery disease-mild on previous CTA and mildly elevated calcium score.  Continue statin.  She is not on aspirin given need for apixaban.  3 high lipidemia-continue statin.  Kirk Ruths, MD

## 2022-06-06 ENCOUNTER — Ambulatory Visit: Payer: PPO | Admitting: Family

## 2022-06-09 NOTE — Pre-Procedure Instructions (Signed)
Attempted to call patient regarding procedure instructions.  Left voice mail on the following items: Arrival time 0530 Nothing to eat or drink after midnight No meds AM of procedure Responsible person to drive you home and stay with you for 24 hrs  Have you missed any doses of anti-coagulant Eliquis- if you have missed any doses please call the office.

## 2022-06-09 NOTE — Anesthesia Preprocedure Evaluation (Signed)
Anesthesia Evaluation  Patient identified by MRN, date of birth, ID band Patient awake    Reviewed: Allergy & Precautions, H&P , NPO status , Patient's Chart, lab work & pertinent test results  Airway Mallampati: II  TM Distance: >3 FB Neck ROM: Full    Dental no notable dental hx. (+) Teeth Intact, Dental Advisory Given   Pulmonary neg pulmonary ROS, former smoker   Pulmonary exam normal breath sounds clear to auscultation       Cardiovascular Exercise Tolerance: Good + dysrhythmias Atrial Fibrillation  Rhythm:Regular Rate:Normal     Neuro/Psych   Anxiety     negative neurological ROS     GI/Hepatic negative GI ROS, Neg liver ROS,,,  Endo/Other  negative endocrine ROS    Renal/GU negative Renal ROS  negative genitourinary   Musculoskeletal  (+) Arthritis , Osteoarthritis,    Abdominal   Peds  Hematology negative hematology ROS (+)   Anesthesia Other Findings   Reproductive/Obstetrics negative OB ROS                             Anesthesia Physical Anesthesia Plan  ASA: 3  Anesthesia Plan: General   Post-op Pain Management: Tylenol PO (pre-op)*   Induction: Intravenous  PONV Risk Score and Plan: 4 or greater and Ondansetron, Dexamethasone and Treatment may vary due to age or medical condition  Airway Management Planned: Oral ETT  Additional Equipment:   Intra-op Plan:   Post-operative Plan: Extubation in OR  Informed Consent: I have reviewed the patients History and Physical, chart, labs and discussed the procedure including the risks, benefits and alternatives for the proposed anesthesia with the patient or authorized representative who has indicated his/her understanding and acceptance.     Dental advisory given  Plan Discussed with: CRNA  Anesthesia Plan Comments:        Anesthesia Quick Evaluation

## 2022-06-10 ENCOUNTER — Ambulatory Visit (HOSPITAL_BASED_OUTPATIENT_CLINIC_OR_DEPARTMENT_OTHER): Payer: PPO | Admitting: Anesthesiology

## 2022-06-10 ENCOUNTER — Ambulatory Visit (HOSPITAL_COMMUNITY): Payer: PPO | Admitting: Anesthesiology

## 2022-06-10 ENCOUNTER — Encounter (HOSPITAL_COMMUNITY)
Admission: RE | Disposition: A | Discharge status: Home / Self Care | Payer: PPO | Source: Home / Self Care | Attending: Cardiology

## 2022-06-10 ENCOUNTER — Other Ambulatory Visit: Payer: Self-pay

## 2022-06-10 ENCOUNTER — Ambulatory Visit (HOSPITAL_COMMUNITY)
Admission: RE | Admit: 2022-06-10 | Discharge: 2022-06-10 | Disposition: A | Discharge status: Home / Self Care | Payer: PPO | Attending: Cardiology | Admitting: Cardiology

## 2022-06-10 DIAGNOSIS — F419 Anxiety disorder, unspecified: Secondary | ICD-10-CM | POA: Diagnosis not present

## 2022-06-10 DIAGNOSIS — M1990 Unspecified osteoarthritis, unspecified site: Secondary | ICD-10-CM | POA: Diagnosis not present

## 2022-06-10 DIAGNOSIS — I4891 Unspecified atrial fibrillation: Secondary | ICD-10-CM

## 2022-06-10 DIAGNOSIS — E785 Hyperlipidemia, unspecified: Secondary | ICD-10-CM | POA: Insufficient documentation

## 2022-06-10 DIAGNOSIS — Z87891 Personal history of nicotine dependence: Secondary | ICD-10-CM | POA: Diagnosis not present

## 2022-06-10 DIAGNOSIS — I4819 Other persistent atrial fibrillation: Secondary | ICD-10-CM | POA: Insufficient documentation

## 2022-06-10 HISTORY — PX: ATRIAL FIBRILLATION ABLATION: EP1191

## 2022-06-10 LAB — POCT ACTIVATED CLOTTING TIME: Activated Clotting Time: 359 seconds

## 2022-06-10 SURGERY — ATRIAL FIBRILLATION ABLATION
Anesthesia: General

## 2022-06-10 MED ORDER — PHENYLEPHRINE HCL-NACL 20-0.9 MG/250ML-% IV SOLN
INTRAVENOUS | Status: DC | PRN
  Administered 2022-06-10: 30 ug/min via INTRAVENOUS

## 2022-06-10 MED ORDER — HEPARIN (PORCINE) IN NACL 1000-0.9 UT/500ML-% IV SOLN
INTRAVENOUS | Status: DC | PRN
  Administered 2022-06-10 (×4): 500 mL

## 2022-06-10 MED ORDER — SUGAMMADEX SODIUM 200 MG/2ML IV SOLN
INTRAVENOUS | Status: DC | PRN
  Administered 2022-06-10: 120 mg via INTRAVENOUS

## 2022-06-10 MED ORDER — HEPARIN SODIUM (PORCINE) 1000 UNIT/ML IJ SOLN
INTRAMUSCULAR | Status: AC
  Filled 2022-06-10: qty 10

## 2022-06-10 MED ORDER — APIXABAN 5 MG PO TABS
5.0000 mg | ORAL_TABLET | Freq: Once | ORAL | Status: AC
  Administered 2022-06-10: 5 mg via ORAL
  Filled 2022-06-10: qty 1

## 2022-06-10 MED ORDER — PROTAMINE SULFATE 10 MG/ML IV SOLN
INTRAVENOUS | Status: DC | PRN
  Administered 2022-06-10: 40 mg via INTRAVENOUS

## 2022-06-10 MED ORDER — FENTANYL CITRATE (PF) 250 MCG/5ML IJ SOLN
INTRAMUSCULAR | Status: DC | PRN
  Administered 2022-06-10: 50 ug via INTRAVENOUS

## 2022-06-10 MED ORDER — SODIUM CHLORIDE 0.9% FLUSH: 3.0000 mL | Freq: Two times a day (BID) | INTRAVENOUS | Status: DC

## 2022-06-10 MED ORDER — DOBUTAMINE INFUSION FOR EP/ECHO/NUC (1000 MCG/ML)
INTRAVENOUS | Status: DC | PRN
  Administered 2022-06-10: 20 ug/kg/min via INTRAVENOUS

## 2022-06-10 MED ORDER — ACETAMINOPHEN 325 MG PO TABS: 650.0000 mg | ORAL_TABLET | ORAL | Status: DC | PRN

## 2022-06-10 MED ORDER — ONDANSETRON HCL 4 MG/2ML IJ SOLN: 4.0000 mg | Freq: Four times a day (QID) | INTRAMUSCULAR | Status: DC | PRN

## 2022-06-10 MED ORDER — HEPARIN SODIUM (PORCINE) 1000 UNIT/ML IJ SOLN
INTRAMUSCULAR | Status: DC | PRN
  Administered 2022-06-10: 13000 [IU] via INTRAVENOUS

## 2022-06-10 MED ORDER — ACETAMINOPHEN 500 MG PO TABS
1000.0000 mg | ORAL_TABLET | Freq: Once | ORAL | Status: AC
  Administered 2022-06-10: 1000 mg via ORAL
  Filled 2022-06-10: qty 2

## 2022-06-10 MED ORDER — DOBUTAMINE INFUSION FOR EP/ECHO/NUC (1000 MCG/ML)
INTRAVENOUS | Status: AC
  Filled 2022-06-10: qty 250

## 2022-06-10 MED ORDER — PROPOFOL 10 MG/ML IV BOLUS
INTRAVENOUS | Status: DC | PRN
  Administered 2022-06-10: 100 mg via INTRAVENOUS

## 2022-06-10 MED ORDER — SODIUM CHLORIDE 0.9 % IV SOLN: INTRAVENOUS | Status: DC

## 2022-06-10 MED ORDER — SODIUM CHLORIDE 0.9% FLUSH: 3.0000 mL | INTRAVENOUS | Status: DC | PRN

## 2022-06-10 MED ORDER — DEXAMETHASONE SODIUM PHOSPHATE 10 MG/ML IJ SOLN
INTRAMUSCULAR | Status: DC | PRN
  Administered 2022-06-10: 4 mg via INTRAVENOUS

## 2022-06-10 MED ORDER — ROCURONIUM BROMIDE 10 MG/ML (PF) SYRINGE
PREFILLED_SYRINGE | INTRAVENOUS | Status: DC | PRN
  Administered 2022-06-10: 50 mg via INTRAVENOUS

## 2022-06-10 MED ORDER — LIDOCAINE 2% (20 MG/ML) 5 ML SYRINGE
INTRAMUSCULAR | Status: DC | PRN
  Administered 2022-06-10: 60 mg via INTRAVENOUS

## 2022-06-10 MED ORDER — SODIUM CHLORIDE 0.9 % IV SOLN: 250.0000 mL | INTRAVENOUS | Status: DC | PRN

## 2022-06-10 MED ORDER — ONDANSETRON HCL 4 MG/2ML IJ SOLN
INTRAMUSCULAR | Status: DC | PRN
  Administered 2022-06-10: 4 mg via INTRAVENOUS

## 2022-06-10 MED ORDER — HEPARIN SODIUM (PORCINE) 1000 UNIT/ML IJ SOLN
INTRAMUSCULAR | Status: DC | PRN
  Administered 2022-06-10: 1000 [IU] via INTRAVENOUS

## 2022-06-10 SURGICAL SUPPLY — 20 items
BAG SNAP BAND KOVER 36X36 (MISCELLANEOUS) IMPLANT
CATH 8FR REPROCESSED SOUNDSTAR (CATHETERS) ×1 IMPLANT
CATH 8FR SOUNDSTAR REPROCESSED (CATHETERS) IMPLANT
CATH ABLAT QDOT MICRO BI TC FJ (CATHETERS) IMPLANT
CATH OCTARAY 2.0 F 3-3-3-3-3 (CATHETERS) IMPLANT
CATH PIGTAIL STEERABLE D1 8.7 (WIRE) IMPLANT
CATH S-M CIRCA TEMP PROBE (CATHETERS) IMPLANT
CATH WEB BI DIR CSDF CRV REPRO (CATHETERS) IMPLANT
CLOSURE PERCLOSE PROSTYLE (VASCULAR PRODUCTS) IMPLANT
COVER SWIFTLINK CONNECTOR (BAG) ×2 IMPLANT
PACK EP LATEX FREE (CUSTOM PROCEDURE TRAY) ×1
PACK EP LF (CUSTOM PROCEDURE TRAY) ×2 IMPLANT
PAD DEFIB RADIO PHYSIO CONN (PAD) ×2 IMPLANT
PATCH CARTO3 (PAD) IMPLANT
SHEATH CARTO VIZIGO SM CVD (SHEATH) IMPLANT
SHEATH PINNACLE 7F 10CM (SHEATH) IMPLANT
SHEATH PINNACLE 8F 10CM (SHEATH) IMPLANT
SHEATH PINNACLE 9F 10CM (SHEATH) IMPLANT
SHEATH PROBE COVER 6X72 (BAG) IMPLANT
TUBING SMART ABLATE COOLFLOW (TUBING) IMPLANT

## 2022-06-10 NOTE — Discharge Instructions (Signed)

## 2022-06-10 NOTE — Anesthesia Postprocedure Evaluation (Signed)
Anesthesia Post Note  Patient: MACIEL KEGG  Procedure(s) Performed: ATRIAL FIBRILLATION ABLATION     Patient location during evaluation: Phase II Anesthesia Type: General Level of consciousness: awake and alert Pain management: pain level controlled Vital Signs Assessment: post-procedure vital signs reviewed and stable Respiratory status: spontaneous breathing, nonlabored ventilation and respiratory function stable Cardiovascular status: blood pressure returned to baseline and stable Postop Assessment: no apparent nausea or vomiting Anesthetic complications: no  There were no known notable events for this encounter.  Last Vitals:  Vitals:   06/10/22 1003 06/10/22 1005  BP:  (!) 102/53  Pulse:  (!) 59  Resp:  16  Temp: 36.8 C   SpO2:  100%    Last Pain:  Vitals:   06/10/22 1026  TempSrc:   PainSc: 3                  Mayar Whittier,W. EDMOND

## 2022-06-10 NOTE — H&P (Signed)
Electrophysiology Office Note   Date:  06/10/2022   ID:  Shelia [name], DOB 08-18-1940, MRN [id number]  PCP:  Sandrea Hughs, NP  Cardiologist:  Whitney Post Primary Electrophysiologist:  Drusilla Wampole Meredith Leeds, MD    Chief Complaint: AF   History of Present Illness: Shelia [name] is a 81 y.o. female who is being seen today for the evaluation of AF at the request of No ref. provider found. Presenting today for electrophysiology evaluation.  The history is noted for atrial fibrillation and hyperlipidemia.  She has had atrial fibrillation for the last few years.  She had a cardioversion April 2021.  She presented to cardiology clinic feeling more short of breath with palpitations and was noted to be in atrial fibrillation.  She has had multiple cardioversions.  Today, denies symptoms of palpitations, chest pain, shortness of breath, orthopnea, PND, lower extremity edema, claudication, dizziness, presyncope, syncope, bleeding, or neurologic sequela. The patient is tolerating medications without difficulties. Plan ablation today.    Past Medical History:  Diagnosis Date   Anxiety    Arthritis    "left knee; left hip; lower back; mild to moderate in right hip" (09/30/2016)   Atrial fibrillation (HCC)    Chronic lower back pain    Gastritis    Heart palpitations    History of bone density study 2018   History of mammogram 2021   History of MRI 2018   History of Papanicolaou smear of cervix    Hypercholesteremia    Osteopenia    Seasonal allergies    Past Surgical History:  Procedure Laterality Date   CARDIOVERSION N/A 11/17/2019   Procedure: CARDIOVERSION;  Surgeon: [name], MD;  Location: [place];  Service: Cardiovascular;  Laterality: N/A;   CARDIOVERSION N/A 04/16/2021   Procedure: CARDIOVERSION;  Surgeon: Skeet Latch, MD;  Location: [place];  Service: Cardiovascular;  Laterality: N/A;   CARDIOVERSION N/A 08/15/2021   Procedure:  CARDIOVERSION;  Surgeon: Janina Mayo, MD;  Location: [facility] ENDOSCOPY;  Service: Cardiovascular;  Laterality: N/A;   COLONOSCOPY     DILATION AND CURETTAGE OF UTERUS     JOINT REPLACEMENT     TONSILLECTOMY     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   Procedure: TOTAL HIP ARTHROPLASTY ANTERIOR APPROACH;  Surgeon: Dorna Leitz, MD;  Location: [place];  Service: Orthopedics;  Laterality: Left;     Current Facility-Administered Medications  Medication Dose Route Frequency Provider Last Rate Last Admin   0.9 %  sodium chloride infusion   Intravenous Continuous Constance Haw, MD 50 mL/hr at 06/10/22 0603 New Bag at 06/10/22 0603    Allergies:   Nsaids and Prednisone   Social History:  The patient  reports that she has quit smoking. Her smoking use included cigarettes. She smoked an average of 2 packs per day. She has never used smokeless tobacco. She reports that she does not currently use alcohol. She reports that she does not use drugs.   Family History:  The patient's family history includes Anxiety disorder in her daughter; Cancer in her father; Diabetes in her sister; Hypertension in her mother.   ROS:  Please see the history of present illness.   Otherwise, review of systems is positive for none.   All other systems are reviewed and negative.   PHYSICAL EXAM: VS:  BP 116/77   Pulse 83   Temp (!) 97.5 F (36.4 C) (Tympanic)   Resp 18   Ht '5\' 7"'$  (1.702 m)  Wt 60.8 kg   SpO2 100%   BMI 20.99 kg/m  , BMI Body mass index is 20.99 kg/m. GEN: Well nourished, well developed, in no acute distress  HEENT: normal  Neck: no JVD, carotid bruits, or masses Cardiac: irregular; no murmurs, rubs, or gallops,no edema  Respiratory:  clear to auscultation bilaterally, normal work of breathing GI: soft, nontender, nondistended, + BS MS: no deformity or atrophy  Skin: warm and dry Neuro:  Strength and sensation are intact Psych: euthymic mood, full affect  Recent Labs: 12/02/2021: TSH  3.28 06/03/2022: ALT 42; BUN 25; Creat 0.82; Hemoglobin 14.7; Platelets 136; Potassium 4.2; Sodium 141    Lipid Panel     Component Value Date/Time   CHOL 126 06/03/2022 0925   CHOL 127 07/25/2021 0907   TRIG 66 06/03/2022 0925   HDL 64 06/03/2022 0925   HDL 54 07/25/2021 0907   CHOLHDL 2.0 06/03/2022 0925   LDLCALC 47 06/03/2022 0925     Wt Readings from Last 3 Encounters:  06/10/22 60.8 kg  04/28/22 61.6 kg  03/25/22 61.5 kg      Other studies Reviewed: Additional studies/ records that were reviewed today include: TTE 10/31/19  Review of the above records today demonstrates:   1. Left ventricular ejection fraction, by estimation, is 60 to 65%. The  left ventricle has normal function. The left ventricle has no regional  wall motion abnormalities. Left ventricular diastolic parameters are  indeterminate.   2. Right ventricular systolic function is normal. The right ventricular  size is normal. There is normal pulmonary artery systolic pressure.   3. Left atrial size was moderately dilated.   4. The mitral valve is normal in structure. Mild mitral valve  regurgitation. No evidence of mitral stenosis.   5. The aortic valve is normal in structure. Aortic valve regurgitation is  trivial. No aortic stenosis is present.   6. The inferior vena cava is normal in size with greater than 50%  respiratory variability, suggesting right atrial pressure of 3 mmHg.   Coronary CT 04/18/2021 1. Coronary calcium score of 192 (LAD). This was 2 percentile for age and sex matched control. 2. Normal coronary origin with Left dominance. 3. There is proximal LAD calcified plaque with 25-49% stenosis. Sending for FFR analysis. 4.  Aortic atherosclerosis.  ASSESSMENT AND PLAN:  1.  Persistent atrial fibrillation: Shelia [name] has presented today for surgery, with the diagnosis of AF.  The various methods of treatment have been discussed with the patient and family. After consideration of  risks, benefits and other options for treatment, the patient has consented to  Procedure(s): Catheter ablation as a surgical intervention .  Risks include but not limited to complete heart block, stroke, esophageal damage, nerve damage, bleeding, vascular damage, tamponade, perforation, MI, and death. The patient's history has been reviewed, patient examined, no change in status, stable for surgery.  I have reviewed the patient's chart and labs.  Questions were answered to the patient's satisfaction.    Austin Pongratz Curt Bears, MD 06/10/2022 7:07 AM

## 2022-06-10 NOTE — Anesthesia Procedure Notes (Signed)
Procedure Name: Intubation Date/Time: 06/10/2022 7:54 AM  Performed by: Darletta Moll, CRNAPre-anesthesia Checklist: Patient identified, Emergency Drugs available, Suction available and Patient being monitored Patient Re-evaluated:Patient Re-evaluated prior to induction Oxygen Delivery Method: Circle system utilized Preoxygenation: Pre-oxygenation with 100% oxygen Induction Type: IV induction Ventilation: Mask ventilation without difficulty Laryngoscope Size: Mac and 3 Tube type: Oral Tube size: 7.0 mm Number of attempts: 1 Airway Equipment and Method: Stylet and Oral airway Placement Confirmation: ETT inserted through vocal cords under direct vision, positive ETCO2 and breath sounds checked- equal and bilateral Secured at: 21 cm Tube secured with: Tape Dental Injury: Teeth and Oropharynx as per pre-operative assessment

## 2022-06-10 NOTE — Transfer of Care (Signed)
Immediate Anesthesia Transfer of Care Note  Patient: Shelia [name]  Procedure(s) Performed: ATRIAL FIBRILLATION ABLATION  Patient Location: Cath Lab  Anesthesia Type:General  Level of Consciousness: drowsy and patient cooperative  Airway & Oxygen Therapy: Patient Spontanous Breathing and Patient connected to nasal cannula oxygen  Post-op Assessment: Report given to RN, Post -op Vital signs reviewed and stable, and Patient moving all extremities X 4  Post vital signs: Reviewed and stable  Last Vitals:  Vitals Value Taken Time  BP 96/61 06/10/22 0933  Temp 36.7 C 06/10/22 0933  Pulse 63 06/10/22 0934  Resp 17 06/10/22 0934  SpO2 100 % 06/10/22 0934  Vitals shown include unvalidated device data.  Last Pain:  Vitals:   06/10/22 0933  TempSrc: Temporal  PainSc: 0-No pain         Complications: There were no known notable events for this encounter.

## 2022-06-11 ENCOUNTER — Other Ambulatory Visit: Payer: Self-pay

## 2022-06-11 ENCOUNTER — Encounter (HOSPITAL_COMMUNITY): Payer: Self-pay | Admitting: Cardiology

## 2022-06-11 DIAGNOSIS — R748 Abnormal levels of other serum enzymes: Secondary | ICD-10-CM

## 2022-06-17 ENCOUNTER — Encounter: Payer: Self-pay | Admitting: Cardiology

## 2022-06-17 ENCOUNTER — Ambulatory Visit (INDEPENDENT_AMBULATORY_CARE_PROVIDER_SITE_OTHER): Payer: PPO | Admitting: Family

## 2022-06-17 ENCOUNTER — Ambulatory Visit: Payer: PPO | Attending: Cardiology | Admitting: Cardiology

## 2022-06-17 ENCOUNTER — Encounter: Payer: Self-pay | Admitting: Family

## 2022-06-17 VITALS — BP 100/60 | HR 52 | Ht 67.0 in | Wt 139.0 lb

## 2022-06-17 VITALS — BP 100/60 | HR 60 | Ht 67.0 in | Wt 139.6 lb

## 2022-06-17 DIAGNOSIS — I2510 Atherosclerotic heart disease of native coronary artery without angina pectoris: Secondary | ICD-10-CM | POA: Diagnosis not present

## 2022-06-17 DIAGNOSIS — R748 Abnormal levels of other serum enzymes: Secondary | ICD-10-CM | POA: Diagnosis not present

## 2022-06-17 DIAGNOSIS — I480 Paroxysmal atrial fibrillation: Secondary | ICD-10-CM | POA: Diagnosis not present

## 2022-06-17 DIAGNOSIS — E785 Hyperlipidemia, unspecified: Secondary | ICD-10-CM

## 2022-06-17 DIAGNOSIS — D696 Thrombocytopenia, unspecified: Secondary | ICD-10-CM | POA: Diagnosis not present

## 2022-06-17 DIAGNOSIS — I4819 Other persistent atrial fibrillation: Secondary | ICD-10-CM

## 2022-06-17 MED ORDER — ROSUVASTATIN CALCIUM 20 MG PO TABS: 10.0000 mg | ORAL_TABLET | Freq: Every day | ORAL | 1 refills | Status: DC

## 2022-06-17 NOTE — Progress Notes (Signed)
Provider: Marlowe Sax FNP-C   Leonidas Boateng, Nelda Bucks, NP  Patient Care Team: Camisha Srey, Nelda Bucks, NP as PCP - General (Family Medicine) Stanford Breed Denice Bors, MD as PCP - Cardiology (Cardiology) Constance Haw, MD as PCP - Electrophysiology (Cardiology) Molli Posey, MD as Consulting Physician (Obstetrics and Gynecology) Stanford Breed Denice Bors, MD as Consulting Physician (Cardiology) Stephannie Li, [place] (Ophthalmology)  Extended Emergency Contact Information Primary Emergency Contact: Lasker,Tom Address: [address]          [place] [address] Johnnette Litter of [place] Phone: [phone] Relation: Spouse Secondary Emergency Contact: Stephannie Li Address: [address]          [place], [place] [address] [place] Mobile Phone: [phone] Relation: Daughter  Code Status:  Full Code  Goals of care: Advanced Directive information    06/10/2022    5:56 AM  Advanced Directives  Does Patient Have a Medical Advance Directive? Yes  Type of Paramedic of [place];Living will  Copy of [place] in Chart? No - copy requested     Chief Complaint  Patient presents with   Medical Management of Chronic Issues    6 month follow up.    Immunizations    Discuss the need for [employer].     HPI:  Pt is a 81 y.o. female seen today for 6 months follow up medical management of chronic diseases.   She denies any acute issues today.states had a visit this morning with her Cardiologist Dr.Crenshaw for follow up Afib 2 weeks post atrial fibrillation ablation on 06/10/2022.She denies any edema,cough,fatigue,chest tightness,chest pain,orthopnea,palpitation or shortness of breath. States continue to exercise though had not been exercise prior to her ablation so might have gained some weight being inactive.  Her recent lab work reviewed unremarkable except liver enzymes were high AST 48 and ALT 42 Rosuvastatin was reduced  from 20 mg tablet to 10 mg tablet daily then repeat hepatic panel in one month if still high will consult with Cardiology use of Statin or alternative.patient does not drink any alcohol.    Past Medical History:  Diagnosis Date   Anxiety    Arthritis    "left knee; left hip; lower back; mild to moderate in right hip" (09/30/2016)   Atrial fibrillation (HCC)    Chronic lower back pain    Gastritis    Heart palpitations    History of bone density study 2018   History of mammogram 2021   History of MRI 2018   History of Papanicolaou smear of cervix    Hypercholesteremia    Osteopenia    Seasonal allergies    Past Surgical History:  Procedure Laterality Date   ATRIAL FIBRILLATION ABLATION N/A 06/10/2022   Procedure: ATRIAL FIBRILLATION ABLATION;  Surgeon: Constance Haw, MD;  Location: [facility];  Service: Cardiovascular;  Laterality: N/A;   CARDIOVERSION N/A 11/17/2019   Procedure: CARDIOVERSION;  Surgeon: [name], MD;  Location: [facility];  Service: Cardiovascular;  Laterality: N/A;   CARDIOVERSION N/A 04/16/2021   Procedure: CARDIOVERSION;  Surgeon: Skeet Latch, MD;  Location: [facility];  Service: Cardiovascular;  Laterality: N/A;   CARDIOVERSION N/A 08/15/2021   Procedure: CARDIOVERSION;  Surgeon: Janina Mayo, MD;  Location: [facility];  Service: Cardiovascular;  Laterality: N/A;   COLONOSCOPY     DILATION AND CURETTAGE OF UTERUS     JOINT REPLACEMENT     TONSILLECTOMY     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  Procedure: TOTAL HIP ARTHROPLASTY ANTERIOR APPROACH;  Surgeon: Dorna Leitz, MD;  Location: [facility];  Service: Orthopedics;  Laterality: Left;    Allergies  Allergen Reactions   Nsaids Other (See Comments)    Caused painful stomach problems   Prednisone Other (See Comments)    Caused painful stomach problems    Allergies as of 06/17/2022       Reactions   Nsaids Other (See Comments)   Caused painful stomach problems    Prednisone Other (See Comments)   Caused painful stomach problems        Medication List        Accurate as of June 17, 2022  3:24 PM. If you have any questions, ask your nurse or doctor.          acetaminophen 325 MG tablet Commonly known as: TYLENOL Take 325 mg by mouth every 6 (six) hours as needed for moderate pain or mild pain.   amoxicillin 500 MG capsule Commonly known as: AMOXIL Take 4 capsules by mouth 1 hour prior to appointment.   cetirizine 10 MG tablet Commonly known as: ZYRTEC Take 10 mg by mouth daily as needed for allergies.   diphenhydramine-acetaminophen 25-500 MG Tabs tablet Commonly known as: TYLENOL PM Take 1 tablet by mouth at bedtime as needed (Pain/sleep).   Eliquis 2.5 MG Tabs tablet Generic drug: apixaban Take 1 tablet (2.5 mg total) by mouth 2 (two) times daily.   FISH OIL PO Take 3 capsules by mouth daily. 2400 mg  1680 EPA TG DHA 550 RTG   GAS-X PO Take 1 capsule by mouth daily as needed (gas).   ketotifen 0.025 % ophthalmic solution Commonly known as: ZADITOR Place 1 drop into both eyes 3 (three) times a week. In the fall   loratadine 10 MG tablet Commonly known as: CLARITIN Take 10 mg by mouth daily as needed for allergies.   Lumify 0.025 % Soln Generic drug: Brimonidine Tartrate Place 1 drop into both eyes 2 (two) times daily. Red eyes   melatonin 3 MG Tabs tablet Take 3 mg by mouth at bedtime. 1/2 tablet   multivitamin with minerals tablet Take 2 tablets by mouth daily. Gummy   NON FORMULARY Take 1 tablet by mouth daily. Plant calcium(1000 mg)   OVER THE COUNTER MEDICATION Place 1 application  into both eyes daily. Pure & Clean   Refresh Optive Mega-3 0.5-1-0.5 % Soln Generic drug: Carboxymeth-Glyc-Polysorb PF Place 1 drop into both eyes 4 (four) times daily as needed (dry eyes). Enhance with flax seed oil   rosuvastatin 20 MG tablet Commonly known as: CRESTOR Take 1 tablet (20 mg total) by mouth  daily. What changed: how much to take        Review of Systems  Constitutional:  Negative for appetite change, chills, fatigue, fever and unexpected weight change.  HENT:  Negative for congestion, dental problem, ear discharge, ear pain, facial swelling, hearing loss, nosebleeds, postnasal drip, rhinorrhea, sinus pressure, sinus pain, sneezing, sore throat, tinnitus and trouble swallowing.   Eyes:  Negative for pain, discharge, redness, itching and visual disturbance.  Respiratory:  Negative for cough, chest tightness, shortness of breath and wheezing.   Cardiovascular:  Negative for chest pain, palpitations and leg swelling.  Gastrointestinal:  Negative for abdominal distention, abdominal pain, blood in stool, constipation, diarrhea, nausea and vomiting.  Endocrine: Negative for cold intolerance, heat intolerance, polydipsia, polyphagia and polyuria.  Genitourinary:  Negative for difficulty urinating, dysuria, flank pain, frequency and urgency.  Musculoskeletal:  Negative for arthralgias, back pain, gait problem, joint swelling, myalgias, neck pain and neck stiffness.  Skin:  Negative for color change, pallor, rash and wound.  Neurological:  Negative for dizziness, syncope, speech difficulty, weakness, light-headedness, numbness and headaches.  Hematological:  Does not bruise/bleed easily.  Psychiatric/Behavioral:  Negative for agitation, behavioral problems, confusion, hallucinations, self-injury, sleep disturbance and suicidal ideas. The patient is not nervous/anxious.     Immunization History  Administered Date(s) Administered   Fluad Quad(high Dose 65+) 06/03/2022   Influenza Split 04/30/2015, 04/20/2016, 04/27/2018, 06/16/2019   Influenza, High Dose Seasonal PF 06/28/2017, 06/16/2019, 06/02/2020   Influenza-Unspecified 06/19/2021   PFIZER(Purple Top)SARS-COV-2 Vaccination 08/12/2019, 09/02/2019, 05/01/2020, 11/13/2020, 04/12/2021   Pneumococcal Conjugate-13 09/30/2013    Pneumococcal Polysaccharide-23 07/27/2003, 09/24/2010   Td 04/26/2001, 12/25/2017   Tdap 09/24/2010   Zoster Recombinat (Shingrix) 11/16/2017, 01/06/2018   Zoster, Live 12/11/2006, 04/27/2018, 06/29/2018   Pertinent  Health Maintenance Due  Topic Date Due   INFLUENZA VACCINE  Completed   DEXA SCAN  Completed      12/04/2021    3:01 PM 12/09/2021    8:42 AM 04/28/2022    3:32 PM 06/10/2022    5:55 AM 06/17/2022    3:12 PM  Fall Risk  Falls in the past year? 0  0  0  Was there an injury with Fall? 0  0    Fall Risk Category Calculator 0  0    Fall Risk Category Low  Low    Patient Fall Risk Level Low fall risk Low fall risk Low fall risk Low fall risk   Patient at Risk for Falls Due to No Fall Risks  No Fall Risks    Fall risk Follow up Falls evaluation completed  Falls evaluation completed     Functional Status Survey:    Vitals:   06/17/22 1505  BP: 100/60  Weight: 139 lb (63 kg)  Height: _0  (1.702 m)   Body mass index is 21.77 kg/m. Physical Exam Vitals reviewed.  Constitutional:      General: She is not in acute distress.    Appearance: Normal appearance. She is normal weight. She is not ill-appearing or diaphoretic.  HENT:     Head: Normocephalic.     Right Ear: Tympanic membrane, ear canal and external ear normal. There is no impacted cerumen.     Left Ear: Tympanic membrane, ear canal and external ear normal. There is no impacted cerumen.     Nose: Nose normal. No congestion or rhinorrhea.     Mouth/Throat:     Mouth: Mucous membranes are moist.     Pharynx: Oropharynx is clear. No oropharyngeal exudate or posterior oropharyngeal erythema.  Eyes:     General: No scleral icterus.       Right eye: No discharge.        Left eye: No discharge.     Extraocular Movements: Extraocular movements intact.     Conjunctiva/sclera: Conjunctivae normal.     Pupils: Pupils are equal, round, and reactive to light.  Neck:     Vascular: No carotid bruit.   Cardiovascular:     Rate and Rhythm: Normal rate and regular rhythm.     Pulses: Normal pulses.     Heart sounds: Normal heart sounds. No murmur heard.    No friction rub. No gallop.  Pulmonary:     Effort: Pulmonary effort is normal. No respiratory distress.     Breath sounds: Normal breath sounds. No wheezing, rhonchi or rales.  Chest:     Chest wall: No tenderness.  Abdominal:     General: Bowel sounds are normal. There is no distension.     Palpations: Abdomen is soft. There is no mass.     Tenderness: There is no abdominal tenderness. There is no right CVA tenderness, left CVA tenderness, guarding or rebound.  Musculoskeletal:        General: No swelling or tenderness. Normal range of motion.     Cervical back: Normal range of motion. No rigidity or tenderness.     Right lower leg: No edema.     Left lower leg: No edema.  Lymphadenopathy:     Cervical: No cervical adenopathy.  Skin:    General: Skin is warm and dry.     Coloration: Skin is not pale.     Findings: No bruising, erythema, lesion or rash.  Neurological:     Mental Status: She is alert and oriented to person, place, and time.     Cranial Nerves: No cranial nerve deficit.     Sensory: No sensory deficit.     Motor: No weakness.     Coordination: Coordination normal.     Gait: Gait normal.  Psychiatric:        Mood and Affect: Mood normal.        Speech: Speech normal.        Behavior: Behavior normal.        Thought Content: Thought content normal.        Judgment: Judgment normal.     Labs reviewed: Recent Labs    03/11/22 1002 05/20/22 0904 06/03/22 0925  NA 142 145* 141  K 4.1 4.6 4.2  CL 109 106 104  CO2 _0 GLUCOSE 72 84 75  BUN <5* 22 25  CREATININE 0.71 0.92 0.82  CALCIUM 9.4 9.3 9.4   Recent Labs    07/25/21 0907 08/15/21 1603 09/13/21 1322 12/02/21 1009 03/11/22 1002 06/03/22 0925  AST 45*   < > 44* 42* 35 48*  ALT 31   < > 33 37* 34 42*  ALKPHOS 54  --  48  --  54  --    BILITOT 0.5   < > 0.8 0.8 0.2* 1.0  PROT 5.8*   < > 6.6 5.9* 5.1* 6.1  ALBUMIN 4.5  --  4.4  --  3.9  --    < > = values in this interval not displayed.   Recent Labs    12/02/21 1009 03/11/22 1002 05/20/22 0904 06/03/22 0925  WBC 3.0* 3.7* 3.3* 3.4*  NEUTROABS 1,431* 1.6*  --  1,336*  HGB 14.1 15.1* 14.3 14.7  HCT 42.4 42.9 42.9 43.5  MCV 100.0 96.4 98* 98.4  PLT 128* 134* 140* 136*   Lab Results  Component Value Date   TSH 3.28 12/02/2021   No results found for: "HGBA1C" Lab Results  Component Value Date   CHOL 126 06/03/2022   HDL 64 06/03/2022   LDLCALC 47 06/03/2022   TRIG 66 06/03/2022   CHOLHDL 2.0 06/03/2022    Significant Diagnostic Results in last 30 days:  EP STUDY  Result Date: 06/10/2022 SURGEON:  Allegra Lai, MD PREPROCEDURE DIAGNOSES: 1. Persistent atrial fibrillation. POSTPROCEDURE DIAGNOSES: 1. Persistent atrial fibrillation. PROCEDURES: 1. Comprehensive electrophysiologic study. 2. Coronary sinus pacing and recording. 3. Three-dimensional mapping of atrial fibrillation (with additional mapping and ablation within the left atrium due to persistence of afib) 4. Ablation of atrial fibrillation (with additional mapping and  ablation within the left atrium due to persistence of afib) 5. Intracardiac echocardiography. 6. Transseptal puncture of an intact septum. 7. Arrhythmia induction with pacing 8. External cardioversion. INTRODUCTION:  CHEYANNE LAMISON is a 81 y.o. female with a history of persistent atrial fibrillation who now presents for EP study and radiofrequency ablation.  The patient reports initially being diagnosed with atrial fibrillation after presenting with symptomatic palpitations and fatgiue.  The patient has failed medical therapy.  The patient therefore presents today for catheter ablation of atrial fibrillation. DESCRIPTION OF PROCEDURE:  Informed written consent was obtained, and the patient was brought to the electrophysiology lab in a  fasting state.  The patient was adequately sedated with intravenous medications as outlined in the anesthesia report.  The patient's left and right groins were prepped and draped in the usual sterile fashion by the EP lab staff.  Using a percutaneous Seldinger technique, two 8-French hemostasis sheaths were placed in the right femoral vein, and one 7 [place] and one 11-French hemostasis sheaths were placed into the left common femoral vein. An esophageal temperature probe was inserted to monitor for heating of the esophagus during the procedure.  Each sheath site was preclosed using an Abbott Perclose and was closed at the end of the case. Direct ultrasound guidance is used for right and left femoral veins with normal vessel patency. Ultrasound images are captured and stored in the patient's chart. Using ultrasound guidance, the Brockenbrough needle and wire were visualized entering the vessel. Catheter Placement:  A 7-French Biosense Webster Decapolar coronary sinus catheter was introduced through the right common femoral vein and advanced into the coronary sinus for recording and pacing from this location.  A luminal esophageal temperature probe was placed and used for continuous monitoring of the luminal esophageal temperature throughout the procedure as well as to localize the esophagus on fluoroscopy. In addition, the esophagus was directly visualized with intracardiac echo and its positioned marked on Carto.  During ablation at the posterior wall there was limited esophageal heating noted during RF energy delivery with the maximal temperature recorded by the luminal temperature probe of < 38.5 degrees C. Initial Measurements: The patient presented to the electrophysiology lab in atrial fibrillation. her QRS measured 91 msec and a QT interval of 355 msec.     Intracardiac Echocardiography: An 8-French Biosense Webster AcuNav intracardiac echocardiography catheter was introduced through the right common femoral  vein and advanced into the right atrium. Intracardiac echocardiography was performed of the left atrium, and a three-dimensional anatomical rendering of the left atrium was performed using CARTO sound technology.  The patient was noted to have a moderate sized left atrium.  The interatrial septum was prominent but not aneurysmal. All 4 pulmonary veins were visualized and noted to have separate ostia.  The pulmonary veins were moderate in size.  The left atrial appendage was visualized and did not reveal thrombus.   There was no evidence of pulmonary vein stenosis. Transseptal Puncture: The right common femoral vein sheaths were exchanged for one 8.5 [employer] and one Bayless sheath and transseptal access was achieved with the Bayless in a standard fashion using a Bayless needle under fluoroscopy with intracardiac echocardiography confirmation of the transseptal puncture.  Once transseptal access had been achieved, heparin was administered intravenously and intra- arterially in order to maintain an ACT of greater than 350 seconds throughout the procedure.  3D Mapping and Ablation: A 3.5 mm [employer] ST/SF Thermocool ablation catheter was advanced into the  right atrium through the Visigo sheath.  The transseptal sheath was pulled back into the IVC over a guidewire.  The ablation catheter was advanced across the transseptal hole using the wire as a guide.  The transseptal sheath was then re-advanced over the guidewire into the left atrium.  A Biosense [employer] mapping catheter was introduced through the transseptal sheath and positioned over the mouth of all 4 pulmonary veins.  Three-dimensional electroanatomical mapping was performed using CARTO technology.  This demonstrated electrical activity within all four pulmonary veins at baseline. The patient underwent successful sequential electrical isolation and anatomical encircling of all four pulmonary veins using  radiofrequency current with a circular mapping catheter as a guide. A WACA approach was used. Due to persistence of atrial fibrillation, additional left atrial mapping and ablation was performed.  A series of radiofrequency lesions were delivered along the roof and floor of the left atrium in order to create a "standard box" lesion along the posterior wall of the left atrium. 20 mcg/kg/min of dobutamine was infused without arrhythmia induced. Cardioversion: The patient was then cardioverted to sinus rhythm with a single synchronized 150-J biphasic shock with cardioversion electrodes in the anterior-posterior thoracic configuration. she remained in sinus rhythm thereafter. Measurements Following Ablation: In sinus rhythm the RR interval was 695 msec, with PR 127 msec, QRS 82 msec, and QT 351 msec.  Following ablation the AH interval measured 93 msec with an HV interval of 45 msec. Ventricular pacing was performed, which revealed midline decremental VA conduction with a VA Wenckebach cycle length of less than 300 msec. Rapid atrial pacing was performed, which revealed an AV Wenckebach cycle length of 440 msec.  Electroisolation was then again confirmed in all four pulmonary veins. The procedure was therefore considered completed.  All catheters were removed, and the sheaths were aspirated and flushed.  The patient was transferred to the recovery area for sheath removal per protocol.  Intracardiac echocardiogram revealed no pericardial effusion. EBL<46m.  There were no early apparent complications. CONCLUSIONS: 1. Atrial fibrillation upon presentation.  2. Successful electrical isolation and anatomical encircling of all four pulmonary veins with radiofrequency current.  A WACA approach was used 3. Additional left atrial ablation was performed with a standard box lesion created along the posterior wall of the left atrium 4. Atrial fibrillation successfully cardioverted to sinus rhythm. 5. No early apparent  complications. Will MHassell DoneCamnitz,MD 9:18 AM 06/10/2022   CT CARDIAC MORPH/PULM VEIN W/CM&W/O CA SCORE  Addendum Date: 06/03/2022   ADDENDUM REPORT: 06/03/2022 14:04 EXAM: OVER-READ INTERPRETATION  CT CHEST The following report is an over-read performed by radiologist Dr. [facility]GB[facility] on 06/03/2022. This over-read does not include interpretation of cardiac or coronary anatomy or pathology. The coronary CTA interpretation by the cardiologist is attached. COMPARISON:  Prior study on 04/18/2021 FINDINGS: No significant noncardiac vascular findings. Visualized mediastinum and hilar regions demonstrate no lymphadenopathy or masses. Visualized lungs show no evidence of pulmonary edema, consolidation, pneumothorax, nodule or pleural fluid. Visualized upper abdomen and bony structures are unremarkable. IMPRESSION: No significant extracardiac findings. Electronically Signed   By: GAletta EdouardM.D.   On: 06/03/2022 14:04   Result Date: 06/03/2022 CLINICAL DATA:  Pre Ablation EXAM: Cardiac Gated CTA TECHNIQUE: The patient was scanned on a Siemens Force 1480slice scanner. Gantry rotation speed was 250 msec with a temporal resolution of 66 msec. A prospective scan was triggered in the ascending thoracic aorta at 140 HU's Data sets were reconstructed with full mA between  35% and 75% of the R-R interval Images were reviewed using VRT, MIP and MPR modes. Double oblique images were used to measure the PV diameter and areas. The patient received 80 cc of contrast at 5 cc/sec CONTRAST:  Isovue 370 total 80 cc COMPARISON:  None Available. FINDINGS: Mild bi atrial enlargement Small PFO. No LAA thrombus Prominent pectinate muscles. No pericardial effusion Normal ascending thoracic aorta diameter 3.1 cm Normal PV anatomy measurements below LUPV:  Ostium 14 mm   area 1.7 cm2 LLPV:   Ostium 15.9 mm  area 1.5 cm2 RUPV:  Ostium 14.3 mm  area 1.4 cm2 RLPV:  Ostium 13 mm   area 1.4 cm2 Calcium score 140  which is 55 th percentile for age/sex Calcium noted in left coronary sinus but not LM LM 0 LAD 140 RCA 0 LCX 0 Total 140 IMPRESSION: 1.  Mild bi atrial enlargement no LAA thrombus 2.  Small PFO 3.  Normal ascending thoracic aorta 3.1 cm 4.  Normal PV anatomy see measurements above 5. Calcium score 140 involving LAD which is 55 th percentile for age/sex 6.  No pericardial effusion Electronically Signed: By: Jenkins Rouge M.D. On: 06/03/2022 13:10    Assessment/Plan 1. Hyperlipidemia LDL goal <100 LDL at goal  - continue on Rosuvastatin  - continue dietary modification and exercise  - Lipid panel; Future  2. Persistent atrial fibrillation ([place]) Status post ablation had f/u with cardiologist prior to visit today. - TSH; Future - COMPLETE METABOLIC PANEL WITH GFR; Future - CBC with Differential/Platelet; Future  3. Thrombocytopenia ([place]) Has improved previous 128; 134; and 140 follow up with hematologist continue to monitor for now.No signs of bleeding.  - CBC with Differential/Platelet; Future  4. Elevated liver enzymes Recent AST 48 and ALT 42 No alcohol use  Suspect due to Statin recently reduced Rosuvastatin from 20 to 10 mg tablet daily.will repeat hepatic panel in one month if still high will consult with Cardiologist use of statin or alternative. Has lab appt 07/16/2022.   Family/ staff Communication: Reviewed plan of care with patient verbalized understanding   Labs/tests ordered:  - CBC with Differential/Platelet - CMP with eGFR([facility]) - TSH - Lipid panel  Next Appointment : Return in about 6 months (around 12/16/2022) for medical mangement of chronic issues., Fasting labs in 6 months prior to visit.   Sandrea Hughs, NP

## 2022-06-17 NOTE — Patient Instructions (Signed)
  Follow-Up: At [facility], you and your health needs are our priority.  As part of our continuing mission to provide you with exceptional heart care, we have created designated Provider Care Teams.  These Care Teams include your primary Cardiologist (physician) and Advanced Practice Providers (APPs -  Physician Assistants and Nurse Practitioners) who all work together to provide you with the care you need, when you need it.  We recommend signing up for the patient portal called "MyChart".  Sign up information is provided on this After Visit Summary.  MyChart is used to connect with patients for Virtual Visits (Telemedicine).  Patients are able to view lab/test results, encounter notes, upcoming appointments, etc.  Non-urgent messages can be sent to your provider as well.   To learn more about what you can do with MyChart, go to [email or web address].    Your next appointment:   6 month(s)  The format for your next appointment:   In Person  Provider:   Brian Crenshaw, MD    

## 2022-06-24 ENCOUNTER — Other Ambulatory Visit: Payer: Self-pay

## 2022-06-24 DIAGNOSIS — R748 Abnormal levels of other serum enzymes: Secondary | ICD-10-CM

## 2022-07-08 ENCOUNTER — Ambulatory Visit (HOSPITAL_COMMUNITY)
Admission: RE | Admit: 2022-07-08 | Discharge: 2022-07-08 | Disposition: A | Discharge status: Home / Self Care | Payer: PPO | Source: Ambulatory Visit | Attending: Physician Assistant | Admitting: Physician Assistant

## 2022-07-08 ENCOUNTER — Encounter (HOSPITAL_COMMUNITY): Payer: Self-pay | Admitting: Physician Assistant

## 2022-07-08 VITALS — BP 112/82 | HR 54 | Ht 67.0 in | Wt 138.4 lb

## 2022-07-08 DIAGNOSIS — D6869 Other thrombophilia: Secondary | ICD-10-CM

## 2022-07-08 DIAGNOSIS — Z7901 Long term (current) use of anticoagulants: Secondary | ICD-10-CM | POA: Insufficient documentation

## 2022-07-08 DIAGNOSIS — I4819 Other persistent atrial fibrillation: Secondary | ICD-10-CM | POA: Insufficient documentation

## 2022-07-08 DIAGNOSIS — I2510 Atherosclerotic heart disease of native coronary artery without angina pectoris: Secondary | ICD-10-CM | POA: Diagnosis not present

## 2022-07-08 DIAGNOSIS — E785 Hyperlipidemia, unspecified: Secondary | ICD-10-CM | POA: Insufficient documentation

## 2022-07-08 NOTE — Progress Notes (Signed)
Primary Care Physician: Sandrea Hughs, NP Primary Cardiologist: Dr Stanford Breed Primary Electrophysiologist: Dr Curt Bears Referring Physician: Antonieta Pert triage    Shelia [name] is a 81 y.o. female with a history of HLD, CAD, atrial fibrillation who presents for follow up in the [facility].  The patient was initially diagnosed with atrial fibrillation early 2021 and underwent DCCV on 11/17/19. She had recurrent symptoms of persistent afib and had repeat DCCV on 04/16/21. Patient is on Eliquis for a CHADS2VASC score of 4. She presented to urgent care 07/24/21 with palpitations and mild SOB. ECG showed rate controlled afib. There were no specific triggers that she could identify. Patient is s/p DCCV on 08/15/21.   On follow up today, patient is s/p afib ablation with Dr Curt Bears on 06/10/22. Patient reports that she has done well since her ablation. She has a Investment banker, operational mobile which has shown SR. She denies chest pain, swallowing pain, or groin issues.   Today, she denies symptoms of palpitations, chest pain, orthopnea, PND, lower extremity edema, dizziness, presyncope, syncope, snoring, daytime somnolence, bleeding, or neurologic sequela. The patient is tolerating medications without difficulties and is otherwise without complaint today.    Atrial Fibrillation Risk Factors:  she does not have symptoms or diagnosis of sleep apnea. she does not have a history of rheumatic fever. she does not have a history of alcohol use.   she has a BMI of Body mass index is 21.68 kg/m.Marland Kitchen Filed Weights   07/08/22 1129  Weight: 62.8 kg    Family History  Problem Relation Age of Onset   Hypertension Mother    Cancer Father        lung cancer   Diabetes Sister    Anxiety disorder Daughter    Breast cancer Neg Hx      Atrial Fibrillation Management history:  Previous antiarrhythmic drugs: none Previous cardioversions: 11/17/19, 04/16/21 Previous ablations:  06/10/22 CHADS2VASC score: 4 Anticoagulation history: Eliquis   Past Medical History:  Diagnosis Date   Anxiety    Arthritis    "left knee; left hip; lower back; mild to moderate in right hip" (09/30/2016)   Atrial fibrillation (HCC)    Chronic lower back pain    Gastritis    Heart palpitations    History of bone density study 2018   History of mammogram 2021   History of MRI 2018   History of Papanicolaou smear of cervix    Hypercholesteremia    Osteopenia    Seasonal allergies    Past Surgical History:  Procedure Laterality Date   ATRIAL FIBRILLATION ABLATION N/A 06/10/2022   Procedure: ATRIAL FIBRILLATION ABLATION;  Surgeon: Constance Haw, MD;  Location: [place] [facility];  Service: Cardiovascular;  Laterality: N/A;   CARDIOVERSION N/A 11/17/2019   Procedure: CARDIOVERSION;  Surgeon: [name], MD;  Location: [facility] ENDOSCOPY;  Service: Cardiovascular;  Laterality: N/A;   CARDIOVERSION N/A 04/16/2021   Procedure: CARDIOVERSION;  Surgeon: Skeet Latch, MD;  Location: [place];  Service: Cardiovascular;  Laterality: N/A;   CARDIOVERSION N/A 08/15/2021   Procedure: CARDIOVERSION;  Surgeon: Janina Mayo, MD;  Location: [facility] ENDOSCOPY;  Service: Cardiovascular;  Laterality: N/A;   COLONOSCOPY     DILATION AND CURETTAGE OF UTERUS     JOINT REPLACEMENT     TONSILLECTOMY     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   Procedure: TOTAL HIP ARTHROPLASTY ANTERIOR APPROACH;  Surgeon: Dorna Leitz, MD;  Location: [place];  Service: Orthopedics;  Laterality:  Left;    Current Outpatient Medications  Medication Sig Dispense Refill   acetaminophen (TYLENOL) 325 MG tablet Take 325 mg by mouth every 6 (six) hours as needed for moderate pain or mild pain.     amoxicillin (AMOXIL) 500 MG capsule Take 4 capsules by mouth 1 hour prior to appointment. 4 capsule 0   apixaban (ELIQUIS) 2.5 MG TABS tablet Take 1 tablet (2.5 mg total) by mouth 2 (two) times daily. 60 tablet 5    Brimonidine Tartrate (LUMIFY) 0.025 % SOLN Place 1 drop into both eyes 2 (two) times daily. Red eyes     Carboxymeth-Glyc-Polysorb PF (REFRESH OPTIVE MEGA-3) 0.5-1-0.5 % SOLN Place 1 drop into both eyes 4 (four) times daily as needed (dry eyes). Enhance with flax seed oil     cetirizine (ZYRTEC) 10 MG tablet Take 10 mg by mouth daily as needed for allergies.     diphenhydramine-acetaminophen (TYLENOL PM) 25-500 MG TABS tablet Take 1 tablet by mouth at bedtime as needed (Pain/sleep).     ketotifen (ZADITOR) 0.025 % ophthalmic solution Place 1 drop into both eyes 3 (three) times a week. In the fall     loratadine (CLARITIN) 10 MG tablet Take 10 mg by mouth daily as needed for allergies.     melatonin 3 MG TABS tablet Take 3 mg by mouth at bedtime. 1/2 tablet     Multiple Vitamins-Minerals (MULTIVITAMIN WITH MINERALS) tablet Take 2 tablets by mouth daily. Gummy     NON FORMULARY Take 1 tablet by mouth daily. Plant calcium(1000 mg)     Omega-3 Fatty Acids (FISH OIL PO) Take 3 capsules by mouth daily. 2400 mg  1680 EPA TG DHA 550 RTG     OVER THE COUNTER MEDICATION Place 1 application  into both eyes daily. Pure & Clean     rosuvastatin (CRESTOR) 20 MG tablet Take 0.5 tablets (10 mg total) by mouth daily. 90 tablet 1   Simethicone (GAS-X PO) Take 1 capsule by mouth daily as needed (gas).     No current facility-administered medications for this encounter.    Allergies  Allergen Reactions   Nsaids Other (See Comments)    Caused painful stomach problems   Prednisone Other (See Comments)    Caused painful stomach problems    Social History   Socioeconomic History   Marital status: Married    Spouse name: Not on file   Number of children: 3   Years of education: Not on file   Highest education level: Not on file  Occupational History   Not on file  Tobacco Use   Smoking status: Former    Packs/day: 2.00    Types: Cigarettes   Smokeless tobacco: Never   Tobacco comments:    Former  smoker 07/08/22  Vaping Use   Vaping Use: Never used  Substance and Sexual Activity   Alcohol use: Not Currently   Drug use: Never   Sexual activity: Yes  Other Topics Concern   Not on file  Social History Narrative   Tobacco use, amount per day now: None.   Past tobacco use, amount per day: Maximum 2 packs    How many years did you use tobacco: Quit 1985   Alcohol use (drinks per week): 3   Diet: Vegetables, Chicken, Fish, Grains, and Fruit.   Do you drink/eat things with caffeine: Coffee   Marital status: Married  What year were you married? 1969   Do you live in a house, apartment, assisted living, condo, trailer, etc.? House.   Is it one or more stories? 2 stories.   How many persons live in your home? 2   Do you have pets in your home?( please list) No.   Highest Level of education completed? Masters in Adult Educations.   Current or past profession: Regulatory affairs officer    Do you exercise? Yes.                                  Type and how often? Floor excercises 5 times week. Walk 2 miles 6 times week.   Do you have a living will? Yes   Do you have a DNR form?  Yes                                 If not, do you want to discuss one?   Do you have signed POA/HPOA forms?  Yes                      If so, please bring to you appointment      Do you have any difficulty bathing or dressing yourself? No   Do you have any difficulty preparing food or eating? No   Do you have any difficulty managing your medications? No   Do you have any difficulty managing your finances? No   Do you have any difficulty affording your medications? No   Social Determinants of Radio broadcast assistant Strain: Not on file  Food Insecurity: Not on file  Transportation Needs: Not on file  Physical Activity: Not on file  Stress: Not on file  Social Connections: Not on file  Intimate Partner Violence: Not on file     ROS- All systems are reviewed and negative except  as per the HPI above.  Physical Exam: Vitals:   07/08/22 1129  BP: 112/82  Pulse: (!) 54  Weight: 62.8 kg  Height: '5\' 7"'$  (1.702 m)     GEN- The patient is a well appearing elderly female, alert and oriented x 3 today.   HEENT-head normocephalic, atraumatic, sclera clear, conjunctiva pink, hearing intact, trachea midline. Lungs- Clear to ausculation bilaterally, normal work of breathing Heart- Regular rate and rhythm, no murmurs, rubs or gallops  GI- soft, NT, ND, + BS Extremities- no clubbing, cyanosis, or edema MS- no significant deformity or atrophy Skin- no rash or lesion Psych- euthymic mood, full affect Neuro- strength and sensation are intact   Wt Readings from Last 3 Encounters:  07/08/22 62.8 kg  06/17/22 63 kg  06/17/22 63.3 kg    EKG today demonstrates  SB Vent. rate 54 BPM PR interval 150 ms QRS duration 86 ms QT/QTcB 436/413 ms  Echo 10/31/19 demonstrated   1. Left ventricular ejection fraction, by estimation, is 60 to 65%. The  left ventricle has normal function. The left ventricle has no regional  wall motion abnormalities. Left ventricular diastolic parameters were  normal. The average left ventricular global longitudinal strain is -22.9 %. The global longitudinal strain is normal.   2. Right ventricular systolic function is normal. The right ventricular  size is normal. There is mildly elevated pulmonary artery systolic  pressure.   3. Left atrial size was moderately dilated.   4.  Right atrial size was moderately dilated.   5. The mitral valve is normal in structure. Mild mitral valve  regurgitation. No evidence of mitral stenosis.   6. Tricuspid valve regurgitation is mild to moderate.   7. The aortic valve is tricuspid. Aortic valve regurgitation is mild. No  aortic stenosis is present.   8. Pulmonic valve regurgitation is moderate.   9. The inferior vena cava is dilated in size with <50% respiratory  variability, suggesting right atrial pressure  of 15 mmHg.   Comparison(s): No significant change from prior study. 10/31/19 EF 60-65%.   Conclusion(s)/Recommendation(s): Otherwise normal echocardiogram, with  minor abnormalities described in the report.   Epic records are reviewed at length today  CHA2DS2-VASc Score = 4  The patient's score is based upon: CHF History: 0 HTN History: 0 Diabetes History: 0 Stroke History: 0 Vascular Disease History: 1 Age Score: 2 Gender Score: 1       ASSESSMENT AND PLAN: 1. Persistent Atrial Fibrillation (ICD10:  I48.19) The patient's CHA2DS2-VASc score is 4, indicating a 4.8% annual risk of stroke.   S/p afib ablation 06/10/22 Patient appears to be maintaining SR.  Continue Eliquis 5 mg BID with no missed doses for 3 months post ablation.  Kardia mobile for home monitoring.  2. Secondary Hypercoagulable State (ICD10:  D68.69) The patient is at significant risk for stroke/thromboembolism based upon her CHA2DS2-VASc Score of 4.  Continue Apixaban (Eliquis).   3. CAD Coronary calcium score 192 (63rd percentile) FFR negative for significant stenosis No anginal symptoms.   Follow up with Dr Curt Bears as scheduled.    [facility] [address] [place], [place] [address] [phone] 07/08/2022 11:44 AM

## 2022-07-09 ENCOUNTER — Other Ambulatory Visit: Payer: Self-pay

## 2022-07-09 ENCOUNTER — Other Ambulatory Visit (HOSPITAL_COMMUNITY): Payer: Self-pay

## 2022-07-10 ENCOUNTER — Other Ambulatory Visit: Payer: Self-pay

## 2022-07-15 ENCOUNTER — Other Ambulatory Visit (HOSPITAL_COMMUNITY): Payer: Self-pay

## 2022-07-15 MED ORDER — APIXABAN 2.5 MG PO TABS
2.5000 mg | ORAL_TABLET | Freq: Two times a day (BID) | ORAL | 4 refills | Status: DC
  Filled 2022-07-15: qty 60, 30d supply, fill #0
  Filled 2022-08-11: qty 60, 30d supply, fill #1
  Filled 2022-09-08: qty 60, 30d supply, fill #2
  Filled 2022-10-16: qty 60, 30d supply, fill #3
  Filled 2022-11-20: qty 60, 30d supply, fill #4

## 2022-07-16 ENCOUNTER — Other Ambulatory Visit: Payer: PPO

## 2022-07-16 DIAGNOSIS — R748 Abnormal levels of other serum enzymes: Secondary | ICD-10-CM | POA: Diagnosis not present

## 2022-07-17 LAB — HEPATIC FUNCTION PANEL
AG Ratio: 2.5 (calc) (ref 1.0–2.5)
ALT: 35 U/L — ABNORMAL HIGH (ref 6–29)
AST: 37 U/L — ABNORMAL HIGH (ref 10–35)
Albumin: 4.2 g/dL (ref 3.6–5.1)
Alkaline phosphatase (APISO): 69 U/L (ref 37–153)
Bilirubin, Direct: 0.2 mg/dL (ref 0.0–0.2)
Globulin: 1.7 g/dL (calc) — ABNORMAL LOW (ref 1.9–3.7)
Indirect Bilirubin: 0.5 mg/dL (calc) (ref 0.2–1.2)
Total Bilirubin: 0.7 mg/dL (ref 0.2–1.2)
Total Protein: 5.9 g/dL — ABNORMAL LOW (ref 6.1–8.1)

## 2022-07-18 ENCOUNTER — Other Ambulatory Visit: Payer: Self-pay

## 2022-07-18 DIAGNOSIS — R748 Abnormal levels of other serum enzymes: Secondary | ICD-10-CM

## 2022-07-31 ENCOUNTER — Other Ambulatory Visit (HOSPITAL_BASED_OUTPATIENT_CLINIC_OR_DEPARTMENT_OTHER): Payer: Self-pay

## 2022-07-31 MED ORDER — COMIRNATY 30 MCG/0.3ML IM SUSY
PREFILLED_SYRINGE | INTRAMUSCULAR | 0 refills | Status: DC
  Filled 2022-07-31: qty 0.3, 1d supply, fill #0

## 2022-08-01 ENCOUNTER — Other Ambulatory Visit (HOSPITAL_BASED_OUTPATIENT_CLINIC_OR_DEPARTMENT_OTHER): Payer: Self-pay

## 2022-08-11 ENCOUNTER — Other Ambulatory Visit (HOSPITAL_COMMUNITY): Payer: Self-pay

## 2022-08-11 ENCOUNTER — Encounter: Payer: Self-pay | Admitting: Family

## 2022-08-11 ENCOUNTER — Ambulatory Visit (INDEPENDENT_AMBULATORY_CARE_PROVIDER_SITE_OTHER): Payer: PPO | Admitting: Family

## 2022-08-11 DIAGNOSIS — R0989 Other specified symptoms and signs involving the circulatory and respiratory systems: Secondary | ICD-10-CM | POA: Diagnosis not present

## 2022-08-11 DIAGNOSIS — R0981 Nasal congestion: Secondary | ICD-10-CM

## 2022-08-11 DIAGNOSIS — J0100 Acute maxillary sinusitis, unspecified: Secondary | ICD-10-CM | POA: Diagnosis not present

## 2022-08-11 DIAGNOSIS — R058 Other specified cough: Secondary | ICD-10-CM

## 2022-08-11 LAB — POC COVID19 BINAXNOW: SARS Coronavirus 2 Ag: NEGATIVE

## 2022-08-11 MED ORDER — AMOXICILLIN-POT CLAVULANATE 875-125 MG PO TABS
1.0000 | ORAL_TABLET | Freq: Two times a day (BID) | ORAL | 0 refills | Status: AC
  Filled 2022-08-11: qty 14, 7d supply, fill #0

## 2022-08-11 NOTE — Patient Instructions (Signed)
- 

## 2022-08-11 NOTE — Progress Notes (Signed)
Provider: Marlowe Sax FNP-C  Brayleigh Rybacki, Nelda Bucks, NP  Patient Care Team: Layth Cerezo, Nelda Bucks, NP as PCP - General (Family Medicine) Stanford Breed Denice Bors, MD as PCP - Cardiology (Cardiology) Constance Haw, MD as PCP - Electrophysiology (Cardiology) Molli Posey, MD as Consulting Physician (Obstetrics and Gynecology) Stanford Breed Denice Bors, MD as Consulting Physician (Cardiology) Stephannie Li, [place] (Ophthalmology)  Extended Emergency Contact Information Primary Emergency Contact: Teutsch,Tom Address: [address]          [place] [address] [place] Phone: [phone] Relation: Spouse Secondary Emergency Contact: Stephannie Li Address: [address]          [place], [place] [address] [place] Mobile Phone: [phone] Relation: Daughter  Code Status:  Full Code  Goals of care: Advanced Directive information    08/11/2022    2:30 PM  Advanced Directives  Does Patient Have a Medical Advance Directive? Yes  Type of Paramedic of [place];Living will;Out of facility DNR (pink MOST or yellow form)  Does patient want to make changes to medical advance directive? No - Patient declined  Copy of [place] in Chart? Yes - validated most recent copy scanned in chart (See row information)     Chief Complaint  Patient presents with   Acute Visit    Patient complains of nasal/chest congestion. Patient has dry cough.     HPI:  Pt is a 82 y.o. female seen today for an acute visit for evaluation of nasal/chest congestion and dry cough x 1 week.states started as a sore throat.COVID-19 test was invalid at home.  She denies any fever,chills,fatigue,body aches,chest tightness,chest pain,palpitation or shortness of breath.  No contact with sick person with COVID-19 infection.   Past Medical History:  Diagnosis Date   Anxiety    Arthritis    "left knee; left hip; lower back; mild to moderate in  right hip" (09/30/2016)   Atrial fibrillation (HCC)    Chronic lower back pain    Gastritis    Heart palpitations    History of bone density study 2018   History of mammogram 2021   History of MRI 2018   History of Papanicolaou smear of cervix    Hypercholesteremia    Osteopenia    Seasonal allergies    Past Surgical History:  Procedure Laterality Date   ATRIAL FIBRILLATION ABLATION N/A 06/10/2022   Procedure: ATRIAL FIBRILLATION ABLATION;  Surgeon: Constance Haw, MD;  Location: [place] [facility];  Service: Cardiovascular;  Laterality: N/A;   CARDIOVERSION N/A 11/17/2019   Procedure: CARDIOVERSION;  Surgeon: [name], MD;  Location: [facility] ENDOSCOPY;  Service: Cardiovascular;  Laterality: N/A;   CARDIOVERSION N/A 04/16/2021   Procedure: CARDIOVERSION;  Surgeon: Skeet Latch, MD;  Location: [place];  Service: Cardiovascular;  Laterality: N/A;   CARDIOVERSION N/A 08/15/2021   Procedure: CARDIOVERSION;  Surgeon: Janina Mayo, MD;  Location: [facility] ENDOSCOPY;  Service: Cardiovascular;  Laterality: N/A;   COLONOSCOPY     DILATION AND CURETTAGE OF UTERUS     JOINT REPLACEMENT     TONSILLECTOMY     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   Procedure: TOTAL HIP ARTHROPLASTY ANTERIOR APPROACH;  Surgeon: Dorna Leitz, MD;  Location: [place];  Service: Orthopedics;  Laterality: Left;    Allergies  Allergen Reactions   Nsaids Other (See Comments)    Caused painful stomach problems   Prednisone Other (See Comments)    Caused painful stomach problems  Outpatient Encounter Medications as of 08/11/2022  Medication Sig   acetaminophen (TYLENOL) 325 MG tablet Take 325 mg by mouth every 6 (six) hours as needed for moderate pain or mild pain.   amoxicillin (AMOXIL) 500 MG capsule Take 4 capsules by mouth 1 hour prior to appointment.   apixaban (ELIQUIS) 2.5 MG TABS tablet Take 1 tablet (2.5 mg total) by mouth 2 (two) times daily.   Brimonidine Tartrate (LUMIFY) 0.025 % SOLN Place  1 drop into both eyes 2 (two) times daily. Red eyes   Carboxymeth-Glyc-Polysorb PF (REFRESH OPTIVE MEGA-3) 0.5-1-0.5 % SOLN Place 1 drop into both eyes 4 (four) times daily as needed (dry eyes). Enhance with flax seed oil   cetirizine (ZYRTEC) 10 MG tablet Take 10 mg by mouth daily as needed for allergies.   diphenhydramine-acetaminophen (TYLENOL PM) 25-500 MG TABS tablet Take 1 tablet by mouth at bedtime as needed (Pain/sleep).   ketotifen (ZADITOR) 0.025 % ophthalmic solution Place 1 drop into both eyes 3 (three) times a week. In the fall   loratadine (CLARITIN) 10 MG tablet Take 10 mg by mouth daily as needed for allergies.   melatonin 3 MG TABS tablet Take 3 mg by mouth at bedtime. 1/2 tablet   Multiple Vitamins-Minerals (MULTIVITAMIN WITH MINERALS) tablet Take 2 tablets by mouth daily. Gummy   NON FORMULARY Take 1 tablet by mouth daily. Plant calcium(1000 mg)   Omega-3 Fatty Acids (FISH OIL PO) Take 3 capsules by mouth daily. 2400 mg  1680 EPA TG DHA 550 RTG   OVER THE COUNTER MEDICATION Place 1 application  into both eyes daily. Pure & Clean   rosuvastatin (CRESTOR) 20 MG tablet Take 0.5 tablets (10 mg total) by mouth daily.   Simethicone (GAS-X PO) Take 1 capsule by mouth daily as needed (gas).   [DISCONTINUED] COVID-19 mRNA vaccine 2023-2024 (COMIRNATY) syringe Inject into the muscle.   No facility-administered encounter medications on file as of 08/11/2022.    Review of Systems  HENT:  Positive for congestion, rhinorrhea and sore throat.   Respiratory:  Positive for cough.     Immunization History  Administered Date(s) Administered   COVID-19, mRNA, vaccine(Comirnaty)12 years and older 07/31/2022   Fluad Quad(high Dose 65+) 06/03/2022   Influenza Split 04/30/2015, 04/20/2016, 04/27/2018, 06/16/2019   Influenza, High Dose Seasonal PF 06/28/2017, 06/16/2019, 06/02/2020   Influenza-Unspecified 06/19/2021   PFIZER(Purple Top)SARS-COV-2 Vaccination 08/12/2019, 09/02/2019,  05/01/2020, 11/13/2020, 04/12/2021   Pneumococcal Conjugate-13 09/30/2013   Pneumococcal Polysaccharide-23 07/27/2003, 09/24/2010   Td 04/26/2001, 12/25/2017   Tdap 09/24/2010   Zoster Recombinat (Shingrix) 11/16/2017, 01/06/2018   Zoster, Live 12/11/2006, 04/27/2018, 06/29/2018   Pertinent  Health Maintenance Due  Topic Date Due   INFLUENZA VACCINE  Completed   DEXA SCAN  Completed      12/09/2021    8:42 AM 04/28/2022    3:32 PM 06/10/2022    5:55 AM 06/17/2022    3:12 PM 08/11/2022    2:30 PM  Fall Risk  Falls in the past year?  0  0 0  Was there an injury with Fall?  0   0  Fall Risk Category Calculator  0   0  Fall Risk Category (Retired)  Low     (RETIRED) Patient Fall Risk Level Low fall risk Low fall risk Low fall risk    Patient at Risk for Falls Due to  No Fall Risks   No Fall Risks  Fall risk Follow up  Falls evaluation completed   Falls evaluation  completed   Functional Status Survey:    Vitals:   08/11/22 1432  BP: 100/70  Pulse: (!) 56  Resp: 16  Temp: (!) 97.2 F (36.2 C)  SpO2: 90%  Weight: 138 lb 7.2 oz (62.8 kg)  Height: '5\' 7"'$  (1.702 m)   Body mass index is 21.68 kg/m. Physical Exam Vitals reviewed.  Constitutional:      General: She is not in acute distress.    Appearance: Normal appearance. She is normal weight. She is not ill-appearing or diaphoretic.  HENT:     Head: Normocephalic.     Right Ear: Tympanic membrane, ear canal and external ear normal. There is no impacted cerumen.     Left Ear: Tympanic membrane, ear canal and external ear normal. There is no impacted cerumen.     Nose: Congestion and rhinorrhea present.     Right Turbinates: Not enlarged, swollen or pale.     Left Turbinates: Not enlarged, swollen or pale.     Right Sinus: Maxillary sinus tenderness present. No frontal sinus tenderness.     Left Sinus: Maxillary sinus tenderness present. No frontal sinus tenderness.     Mouth/Throat:     Mouth: Mucous membranes are moist.      Pharynx: Oropharynx is clear. No oropharyngeal exudate or posterior oropharyngeal erythema.  Eyes:     General: No scleral icterus.       Right eye: No discharge.        Left eye: No discharge.     Extraocular Movements: Extraocular movements intact.     Conjunctiva/sclera: Conjunctivae normal.     Pupils: Pupils are equal, round, and reactive to light.  Neck:     Vascular: No carotid bruit.  Cardiovascular:     Rate and Rhythm: Normal rate and regular rhythm.     Pulses: Normal pulses.     Heart sounds: Normal heart sounds. No murmur heard.    No friction rub. No gallop.  Pulmonary:     Effort: Pulmonary effort is normal. No respiratory distress.     Breath sounds: Normal breath sounds. No wheezing, rhonchi or rales.  Chest:     Chest wall: No tenderness.  Abdominal:     General: Bowel sounds are normal. There is no distension.     Palpations: Abdomen is soft. There is no mass.     Tenderness: There is no abdominal tenderness. There is no right CVA tenderness, left CVA tenderness, guarding or rebound.  Musculoskeletal:     Cervical back: Normal range of motion. No rigidity or tenderness.  Lymphadenopathy:     Cervical: No cervical adenopathy.  Skin:    General: Skin is warm and dry.     Coloration: Skin is not pale.     Findings: No erythema or rash.  Neurological:     Mental Status: She is alert and oriented to person, place, and time.     Motor: No weakness.     Gait: Gait normal.  Psychiatric:        Mood and Affect: Mood normal.        Speech: Speech normal.        Behavior: Behavior normal.     Labs reviewed: Recent Labs    03/11/22 1002 05/20/22 0904 06/03/22 0925  NA 142 145* 141  K 4.1 4.6 4.2  CL 109 106 104  CO2 '27 26 30  '$ GLUCOSE 72 84 75  BUN <5* 22 25  CREATININE 0.71 0.92 0.82  CALCIUM 9.4 9.3  9.4   Recent Labs    09/13/21 1322 12/02/21 1009 03/11/22 1002 06/03/22 0925 07/16/22 0804  AST 44*   < > 35 48* 37*  ALT 33   < > 34 42* 35*   ALKPHOS 48  --  54  --   --   BILITOT 0.8   < > 0.2* 1.0 0.7  PROT 6.6   < > 5.1* 6.1 5.9*  ALBUMIN 4.4  --  3.9  --   --    < > = values in this interval not displayed.   Recent Labs    12/02/21 1009 03/11/22 1002 05/20/22 0904 06/03/22 0925  WBC 3.0* 3.7* 3.3* 3.4*  NEUTROABS 1,431* 1.6*  --  1,336*  HGB 14.1 15.1* 14.3 14.7  HCT 42.4 42.9 42.9 43.5  MCV 100.0 96.4 98* 98.4  PLT 128* 134* 140* 136*   Lab Results  Component Value Date   TSH 3.28 12/02/2021   No results found for: "HGBA1C" Lab Results  Component Value Date   CHOL 126 06/03/2022   HDL 64 06/03/2022   LDLCALC 47 06/03/2022   TRIG 66 06/03/2022   CHOLHDL 2.0 06/03/2022    Significant Diagnostic Results in last 30 days:  No results found.  Assessment/Plan  1. Dry cough Afebrile Lungs clear to auscultation -Continue with over-the-counter cough syrup - POC COVID-19 results negative - amoxicillin-clavulanate (AUGMENTIN) 875-125 MG tablet; Take 1 tablet by mouth 2 (two) times daily for 7 days.  Dispense: 14 tablet; Refill: 0  2. Acute non-recurrent maxillary sinusitis Bilateral maxillary sinus tender to palpation on exam Will start on Augmentin as above side effects discussed advised to take probiotic - POC COVID-19 results negative  3. Chest congestion No shortness of breath or wheezing noted on exam high risk to develop pneumonia will treat with Augmentin - POC COVID-19 results negative - amoxicillin-clavulanate (AUGMENTIN) 875-125 MG tablet; Take 1 tablet by mouth 2 (two) times daily for 7 days.  Dispense: 14 tablet; Refill: 0 -Will obtain chest imaging if symptoms not resolved with antibiotics.  Overall seems stable.  Family/ staff Communication: Reviewed plan of care with patient verbalized understanding  Labs/tests ordered: None   Next Appointment: Return if symptoms worsen or fail to improve.   Sandrea Hughs, NP

## 2022-08-21 DIAGNOSIS — L565 Disseminated superficial actinic porokeratosis (DSAP): Secondary | ICD-10-CM | POA: Diagnosis not present

## 2022-08-21 DIAGNOSIS — M2041 Other hammer toe(s) (acquired), right foot: Secondary | ICD-10-CM | POA: Diagnosis not present

## 2022-08-21 DIAGNOSIS — M21611 Bunion of right foot: Secondary | ICD-10-CM | POA: Diagnosis not present

## 2022-08-21 DIAGNOSIS — D2371 Other benign neoplasm of skin of right lower limb, including hip: Secondary | ICD-10-CM | POA: Diagnosis not present

## 2022-08-21 DIAGNOSIS — M21612 Bunion of left foot: Secondary | ICD-10-CM | POA: Diagnosis not present

## 2022-09-08 ENCOUNTER — Ambulatory Visit: Payer: PPO | Attending: Cardiology | Admitting: Cardiology

## 2022-09-08 ENCOUNTER — Encounter: Payer: Self-pay | Admitting: Cardiology

## 2022-09-08 VITALS — BP 116/80 | HR 54 | Ht 67.0 in | Wt 138.0 lb

## 2022-09-08 DIAGNOSIS — D6869 Other thrombophilia: Secondary | ICD-10-CM | POA: Diagnosis not present

## 2022-09-08 DIAGNOSIS — I480 Paroxysmal atrial fibrillation: Secondary | ICD-10-CM

## 2022-09-08 NOTE — Patient Instructions (Signed)
Medication Instructions:  Your physician recommends that you continue on your current medications as directed. Please refer to the Current Medication list given to you today.  *If you need a refill on your cardiac medications before your next appointment, please call your pharmacy*   Lab Work: None ordered  Testing/Procedures: None ordered   Follow-Up: At [facility], you and your health needs are our priority.  As part of our continuing mission to provide you with exceptional heart care, we have created designated Provider Care Teams.  These Care Teams include your primary Cardiologist (physician) and Advanced Practice Providers (APPs -  Physician Assistants and Nurse Practitioners) who all work together to provide you with the care you need, when you need it.  Your next appointment:   6 month(s)  The format for your next appointment:   In Person  Provider:   You will follow up in the [facility] located at [facility]. Your provider will be: Clint R. Fenton, PA-C    Thank you for choosing [facility]!!   Trinidad Curet, RN [phone]

## 2022-09-08 NOTE — Progress Notes (Signed)
Electrophysiology Office Note   Date:  09/08/2022   ID:  Shelia [name], DOB 07-Jan-1941, MRN [id number]  PCP:  Sandrea Hughs, NP  Cardiologist:  Whitney Post Primary Electrophysiologist:  Malaina Mortellaro Meredith Leeds, MD    Chief Complaint: AF   History of Present Illness: Shelia [name] is a 82 y.o. female who is being seen today for the evaluation of AF at the request of [name], Dinah C, NP. Presenting today for electrophysiology evaluation.  She has a history significant for atrial fibrillation and hyperlipidemia.  She had been having episodes of atrial fibrillation in the last 2 years.  She is post cardioversion April 2021.  She was having more frequent episodes of palpitations and shortness of breath.  She is now status post ablation 06/10/2022.  Today, denies symptoms of palpitations, chest pain, shortness of breath, orthopnea, PND, lower extremity edema, claudication, dizziness, presyncope, syncope, bleeding, or neurologic sequela. The patient is tolerating medications without difficulties.  Since her ablation she has done well.  She has noted no further episodes of atrial fibrillation.  She is able to do all of her daily activities without restriction.    Past Medical History:  Diagnosis Date   Anxiety    Arthritis    "left knee; left hip; lower back; mild to moderate in right hip" (09/30/2016)   Atrial fibrillation (HCC)    Chronic lower back pain    Gastritis    Heart palpitations    History of bone density study 2018   History of mammogram 2021   History of MRI 2018   History of Papanicolaou smear of cervix    Hypercholesteremia    Osteopenia    Seasonal allergies    Past Surgical History:  Procedure Laterality Date   ATRIAL FIBRILLATION ABLATION N/A 06/10/2022   Procedure: ATRIAL FIBRILLATION ABLATION;  Surgeon: Constance Haw, MD;  Location: [place] [facility];  Service: Cardiovascular;  Laterality: N/A;   CARDIOVERSION N/A 11/17/2019    Procedure: CARDIOVERSION;  Surgeon: [name], MD;  Location: [facility];  Service: Cardiovascular;  Laterality: N/A;   CARDIOVERSION N/A 04/16/2021   Procedure: CARDIOVERSION;  Surgeon: Skeet Latch, MD;  Location: [place];  Service: Cardiovascular;  Laterality: N/A;   CARDIOVERSION N/A 08/15/2021   Procedure: CARDIOVERSION;  Surgeon: Janina Mayo, MD;  Location: [facility];  Service: Cardiovascular;  Laterality: N/A;   COLONOSCOPY     DILATION AND CURETTAGE OF UTERUS     JOINT REPLACEMENT     TONSILLECTOMY     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   Procedure: TOTAL HIP ARTHROPLASTY ANTERIOR APPROACH;  Surgeon: Dorna Leitz, MD;  Location: [facility];  Service: Orthopedics;  Laterality: Left;     Current Outpatient Medications  Medication Sig Dispense Refill   acetaminophen (TYLENOL) 325 MG tablet Take 325 mg by mouth every 6 (six) hours as needed for moderate pain or mild pain.     amoxicillin (AMOXIL) 500 MG capsule Take 4 capsules by mouth 1 hour prior to appointment. 4 capsule 0   apixaban (ELIQUIS) 2.5 MG TABS tablet Take 1 tablet (2.5 mg total) by mouth 2 (two) times daily. 60 tablet 4   Brimonidine Tartrate (LUMIFY) 0.025 % SOLN Place 1 drop into both eyes 2 (two) times daily. Red eyes     Carboxymeth-Glyc-Polysorb PF (REFRESH OPTIVE MEGA-3) 0.5-1-0.5 % SOLN Place 1 drop into both eyes 4 (four) times daily as needed (dry eyes). Enhance with flax seed oil     cetirizine (  ZYRTEC) 10 MG tablet Take 10 mg by mouth daily as needed for allergies.     diphenhydramine-acetaminophen (TYLENOL PM) 25-500 MG TABS tablet Take 1 tablet by mouth at bedtime as needed (Pain/sleep).     ketotifen (ZADITOR) 0.025 % ophthalmic solution Place 1 drop into both eyes 3 (three) times a week. In the fall     loratadine (CLARITIN) 10 MG tablet Take 10 mg by mouth daily as needed for allergies.     melatonin 3 MG TABS tablet Take 3 mg by mouth at bedtime. 1/2 tablet     Multiple  Vitamins-Minerals (MULTIVITAMIN WITH MINERALS) tablet Take 2 tablets by mouth daily. Gummy     NON FORMULARY Take 1 tablet by mouth daily. Plant calcium(1000 mg)     Omega-3 Fatty Acids (FISH OIL PO) Take 3 capsules by mouth daily. 2400 mg  1680 EPA TG DHA 550 RTG     OVER THE COUNTER MEDICATION Place 1 application  into both eyes daily. Pure & Clean     rosuvastatin (CRESTOR) 20 MG tablet Take 0.5 tablets (10 mg total) by mouth daily. 90 tablet 1   Simethicone (GAS-X PO) Take 1 capsule by mouth daily as needed (gas).     No current facility-administered medications for this visit.    Allergies:   Nsaids and Prednisone   Social History:  The patient  reports that she has quit smoking. Her smoking use included cigarettes. She smoked an average of 2 packs per day. She has never used smokeless tobacco. She reports that she does not currently use alcohol. She reports that she does not use drugs.   Family History:  The patient's family history includes Anxiety disorder in her daughter; Cancer in her father; Diabetes in her sister; Hypertension in her mother.   ROS:  Please see the history of present illness.   Otherwise, review of systems is positive for none.   All other systems are reviewed and negative.   PHYSICAL EXAM: VS:  BP 116/80   Pulse (!) 54   Ht 5' 7"$  (1.702 m)   Wt 138 lb (62.6 kg)   SpO2 96%   BMI 21.61 kg/m  , BMI Body mass index is 21.61 kg/m. GEN: Well nourished, well developed, in no acute distress  HEENT: normal  Neck: no JVD, carotid bruits, or masses Cardiac: RRR; no murmurs, rubs, or gallops,no edema  Respiratory:  clear to auscultation bilaterally, normal work of breathing GI: soft, nontender, nondistended, + BS MS: no deformity or atrophy  Skin: warm and dry Neuro:  Strength and sensation are intact Psych: euthymic mood, full affect  EKG:  EKG is ordered today. Personal review of the ekg ordered shows sinus rhythm   Recent Labs: 12/02/2021: TSH  3.28 06/03/2022: BUN 25; Creat 0.82; Hemoglobin 14.7; Platelets 136; Potassium 4.2; Sodium 141 07/16/2022: ALT 35    Lipid Panel     Component Value Date/Time   CHOL 126 06/03/2022 0925   CHOL 127 07/25/2021 0907   TRIG 66 06/03/2022 0925   HDL 64 06/03/2022 0925   HDL 54 07/25/2021 0907   CHOLHDL 2.0 06/03/2022 0925   LDLCALC 47 06/03/2022 0925     Wt Readings from Last 3 Encounters:  09/08/22 138 lb (62.6 kg)  08/11/22 138 lb 7.2 oz (62.8 kg)  07/08/22 138 lb 6.4 oz (62.8 kg)      Other studies Reviewed: Additional studies/ records that were reviewed today include: TTE 10/31/19  Review of the above records today demonstrates:  1. Left ventricular ejection fraction, by estimation, is 60 to 65%. The  left ventricle has normal function. The left ventricle has no regional  wall motion abnormalities. Left ventricular diastolic parameters are  indeterminate.   2. Right ventricular systolic function is normal. The right ventricular  size is normal. There is normal pulmonary artery systolic pressure.   3. Left atrial size was moderately dilated.   4. The mitral valve is normal in structure. Mild mitral valve  regurgitation. No evidence of mitral stenosis.   5. The aortic valve is normal in structure. Aortic valve regurgitation is  trivial. No aortic stenosis is present.   6. The inferior vena cava is normal in size with greater than 50%  respiratory variability, suggesting right atrial pressure of 3 mmHg.   Coronary CT 04/18/2021 1. Coronary calcium score of 192 (LAD). This was 55 percentile for age and sex matched control. 2. Normal coronary origin with Left dominance. 3. There is proximal LAD calcified plaque with 25-49% stenosis. Sending for FFR analysis. 4.  Aortic atherosclerosis.  ASSESSMENT AND PLAN:  1.  Persistent atrial fibrillation: Currently on Eliquis 5 mg twice daily.  CHA2DS2-VASc of 3.  Is status post ablation 06/10/2022.  She is remained in sinus rhythm.   She has had no further episodes of atrial fibrillation.  [place] continue with current management.  2.  Hyperlipidemia: Fish oil per primary cardiology  3.  Secondary hypercoagulable state: Currently on Eliquis for atrial fibrillation as above   Current medicines are reviewed at length with the patient today.   The patient does not have concerns regarding her medicines.  The following changes were made today: None  Labs/ tests ordered today include:  Orders Placed This Encounter  Procedures   EKG 12-Lead     Disposition:   FU 6 months  Signed, Bill Mcvey Meredith Leeds, MD  09/08/2022 3:17 PM     [address] [place] [address] [phone] (office) [phone] (fax)

## 2022-09-12 ENCOUNTER — Other Ambulatory Visit (HOSPITAL_COMMUNITY): Payer: Self-pay

## 2022-10-13 DIAGNOSIS — Z01419 Encounter for gynecological examination (general) (routine) without abnormal findings: Secondary | ICD-10-CM | POA: Diagnosis not present

## 2022-10-13 DIAGNOSIS — Z6822 Body mass index (BMI) 22.0-22.9, adult: Secondary | ICD-10-CM | POA: Diagnosis not present

## 2022-10-16 ENCOUNTER — Other Ambulatory Visit (HOSPITAL_COMMUNITY): Payer: Self-pay

## 2022-10-17 ENCOUNTER — Other Ambulatory Visit (HOSPITAL_COMMUNITY): Payer: Self-pay

## 2022-10-27 ENCOUNTER — Other Ambulatory Visit: Payer: Self-pay | Admitting: Cardiology

## 2022-10-27 ENCOUNTER — Ambulatory Visit (INDEPENDENT_AMBULATORY_CARE_PROVIDER_SITE_OTHER): Payer: PPO | Admitting: Adult Health

## 2022-10-27 ENCOUNTER — Other Ambulatory Visit (HOSPITAL_COMMUNITY): Payer: Self-pay

## 2022-10-27 ENCOUNTER — Ambulatory Visit
Admission: RE | Admit: 2022-10-27 | Discharge: 2022-10-27 | Disposition: A | Discharge status: Home / Self Care | Payer: PPO | Source: Ambulatory Visit | Attending: Adult Health | Admitting: Adult Health

## 2022-10-27 ENCOUNTER — Encounter: Payer: Self-pay | Admitting: Adult Health

## 2022-10-27 VITALS — BP 127/88 | HR 60 | Temp 96.8°F | Resp 18 | Ht 67.0 in | Wt 137.4 lb

## 2022-10-27 DIAGNOSIS — Z9181 History of falling: Secondary | ICD-10-CM

## 2022-10-27 DIAGNOSIS — G44319 Acute post-traumatic headache, not intractable: Secondary | ICD-10-CM | POA: Diagnosis not present

## 2022-10-27 DIAGNOSIS — I480 Paroxysmal atrial fibrillation: Secondary | ICD-10-CM | POA: Diagnosis not present

## 2022-10-27 DIAGNOSIS — R519 Headache, unspecified: Secondary | ICD-10-CM | POA: Diagnosis not present

## 2022-10-27 DIAGNOSIS — M79641 Pain in right hand: Secondary | ICD-10-CM

## 2022-10-27 DIAGNOSIS — S0990XA Unspecified injury of head, initial encounter: Secondary | ICD-10-CM | POA: Diagnosis not present

## 2022-10-27 MED ORDER — ROSUVASTATIN CALCIUM 20 MG PO TABS
20.0000 mg | ORAL_TABLET | Freq: Every day | ORAL | 1 refills | Status: DC
  Filled 2022-10-27: qty 90, 90d supply, fill #0

## 2022-10-27 NOTE — Progress Notes (Signed)
[facility] clinic  Provider:  Durenda Age DNP  Code Status:  Full Code  Goals of Care:     08/11/2022    2:30 PM  Advanced Directives  Does Patient Have a Medical Advance Directive? Yes  Type of Paramedic of [place];Living will;Out of facility DNR (pink MOST or yellow form)  Does patient want to make changes to medical advance directive? No - Patient declined  Copy of [place] in Chart? Yes - validated most recent copy scanned in chart (See row information)     Chief Complaint  Patient presents with   Acute Visit    Pain in the head and right hand.    HPI: Patient is a 82 y.o. female seen today for an acute visit S/P fall. She stated that she fell a week ago and yesterday hitting her head and right hand. She did not see the dog gate and went through it. She has bruising on her left outer eye, abrasion on her left knee and pain on her right hand. She has 4-5/10 pain on her right hand and complains that she cannot stretch her right thumb. She is experiencing occasional headache, 1/10. She forgot about her fall details but said that she has been having memory issues even before the fall. She takes Eliquis 2.5 mg BID for Atrial fibrillation. She has been taking Eliquis for 2 years now.    Past Medical History:  Diagnosis Date   Anxiety    Arthritis    "left knee; left hip; lower back; mild to moderate in right hip" (09/30/2016)   Atrial fibrillation    Chronic lower back pain    Gastritis    Heart palpitations    History of bone density study 2018   History of mammogram 2021   History of MRI 2018   History of Papanicolaou smear of cervix    Hypercholesteremia    Osteopenia    Seasonal allergies     Past Surgical History:  Procedure Laterality Date   ATRIAL FIBRILLATION ABLATION N/A 06/10/2022   Procedure: ATRIAL FIBRILLATION ABLATION;  Surgeon: Constance Haw, MD;  Location: [place] [facility];  Service:  Cardiovascular;  Laterality: N/A;   CARDIOVERSION N/A 11/17/2019   Procedure: CARDIOVERSION;  Surgeon: [name], MD;  Location: [facility] ENDOSCOPY;  Service: Cardiovascular;  Laterality: N/A;   CARDIOVERSION N/A 04/16/2021   Procedure: CARDIOVERSION;  Surgeon: Skeet Latch, MD;  Location: [place];  Service: Cardiovascular;  Laterality: N/A;   CARDIOVERSION N/A 08/15/2021   Procedure: CARDIOVERSION;  Surgeon: Janina Mayo, MD;  Location: [facility] ENDOSCOPY;  Service: Cardiovascular;  Laterality: N/A;   COLONOSCOPY     DILATION AND CURETTAGE OF UTERUS     JOINT REPLACEMENT     TONSILLECTOMY     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   Procedure: TOTAL HIP ARTHROPLASTY ANTERIOR APPROACH;  Surgeon: Dorna Leitz, MD;  Location: [place];  Service: Orthopedics;  Laterality: Left;    Allergies  Allergen Reactions   Nsaids Other (See Comments)    Caused painful stomach problems   Prednisone Other (See Comments)    Caused painful stomach problems    Outpatient Encounter Medications as of 10/27/2022  Medication Sig   acetaminophen (TYLENOL) 325 MG tablet Take 325 mg by mouth every 6 (six) hours as needed for moderate pain or mild pain.   amoxicillin (AMOXIL) 500 MG capsule Take 4 capsules by mouth 1 hour prior to appointment.   apixaban (ELIQUIS) 2.5  MG TABS tablet Take 1 tablet (2.5 mg total) by mouth 2 (two) times daily.   Brimonidine Tartrate (LUMIFY) 0.025 % SOLN Place 1 drop into both eyes 2 (two) times daily. Red eyes   Carboxymeth-Glyc-Polysorb PF (REFRESH OPTIVE MEGA-3) 0.5-1-0.5 % SOLN Place 1 drop into both eyes 4 (four) times daily as needed (dry eyes). Enhance with flax seed oil   diphenhydramine-acetaminophen (TYLENOL PM) 25-500 MG TABS tablet Take 1 tablet by mouth at bedtime as needed (Pain/sleep).   ketotifen (ZADITOR) 0.025 % ophthalmic solution Place 1 drop into both eyes 3 (three) times a week. In the fall   loratadine (CLARITIN) 10 MG tablet Take 10 mg by mouth daily as  needed for allergies.   melatonin 3 MG TABS tablet Take 3 mg by mouth at bedtime. 1/2 tablet   NON FORMULARY Take 1 tablet by mouth daily. Plant calcium(1000 mg)   Omega-3 Fatty Acids (FISH OIL PO) Take 3 capsules by mouth daily. 2400 mg  1680 EPA TG DHA 550 RTG   OVER THE COUNTER MEDICATION Place 1 application  into both eyes daily. Pure & Clean   rosuvastatin (CRESTOR) 20 MG tablet Take 0.5 tablets (10 mg total) by mouth daily.   Simethicone (GAS-X PO) Take 1 capsule by mouth daily as needed (gas).   [DISCONTINUED] cetirizine (ZYRTEC) 10 MG tablet Take 10 mg by mouth daily as needed for allergies.   [DISCONTINUED] Multiple Vitamins-Minerals (MULTIVITAMIN WITH MINERALS) tablet Take 2 tablets by mouth daily. Gummy   No facility-administered encounter medications on file as of 10/27/2022.    Review of Systems:  Review of Systems  Constitutional:  Negative for appetite change, chills, fatigue and fever.  HENT:  Negative for congestion, hearing loss, rhinorrhea and sore throat.   Eyes: Negative.   Respiratory:  Negative for cough, shortness of breath and wheezing.   Cardiovascular:  Negative for chest pain, palpitations and leg swelling.  Gastrointestinal:  Negative for abdominal pain, constipation, diarrhea, nausea and vomiting.  Genitourinary:  Negative for dysuria.  Musculoskeletal:  Negative for arthralgias, back pain and myalgias.  Skin:  Negative for color change, rash and wound.  Neurological:  Positive for headaches. Negative for dizziness and weakness.  Psychiatric/Behavioral:  Negative for behavioral problems. The patient is not nervous/anxious.     Health Maintenance  Topic Date Due   COVID-19 Vaccine (7 - 2023-24 season) 09/25/2022   INFLUENZA VACCINE  02/26/2023   Medicare Annual Wellness (AWV)  04/29/2023   DTaP/Tdap/Td (4 - Td or Tdap) 12/26/2027   Pneumonia Vaccine 21+ Years old  Completed   DEXA SCAN  Completed   Zoster Vaccines- Shingrix  Completed   HPV VACCINES   Aged Out    Physical Exam: Vitals:   10/27/22 1058  BP: 127/88  Pulse: 60  Resp: 18  Temp: (!) 96.8 F (36 C)  SpO2: 98%  Weight: 137 lb 6 oz (62.3 kg)  Height: 5\' 7"  (1.702 m)   Body mass index is 21.52 kg/m. Physical Exam Constitutional:      Appearance: Normal appearance.  HENT:     Head: Normocephalic and atraumatic.     Nose: Nose normal.     Mouth/Throat:     Mouth: Mucous membranes are moist.  Eyes:     Conjunctiva/sclera: Conjunctivae normal.  Cardiovascular:     Rate and Rhythm: Normal rate and regular rhythm.  Pulmonary:     Effort: Pulmonary effort is normal.     Breath sounds: Normal breath sounds.  Abdominal:  General: Bowel sounds are normal.     Palpations: Abdomen is soft.  Musculoskeletal:        General: Normal range of motion.     Cervical back: Normal range of motion.  Skin:    General: Skin is warm and dry.     Findings: Bruising present.     Comments: Bruise on left outer eye Abrasion on left knee  Neurological:     General: No focal deficit present.     Mental Status: She is alert and oriented to person, place, and time.  Psychiatric:        Mood and Affect: Mood normal.        Behavior: Behavior normal.        Thought Content: Thought content normal.        Judgment: Judgment normal.     Labs reviewed: Basic Metabolic Panel: Recent Labs    12/02/21 1009 03/11/22 1002 05/20/22 0904 06/03/22 0925  NA 142 142 145* 141  K 3.9 4.1 4.6 4.2  CL 106 109 106 104  CO2 29 27 26 30   GLUCOSE 99 72 84 75  BUN 25 <5* 22 25  CREATININE 0.91 0.71 0.92 0.82  CALCIUM 9.2 9.4 9.3 9.4  TSH 3.28  --   --   --    Liver Function Tests: Recent Labs    03/11/22 1002 06/03/22 0925 07/16/22 0804  AST 35 48* 37*  ALT 34 42* 35*  ALKPHOS 54  --   --   BILITOT 0.2* 1.0 0.7  PROT 5.1* 6.1 5.9*  ALBUMIN 3.9  --   --    No results for input(s): "LIPASE", "AMYLASE" in the last 8760 hours. No results for input(s): "AMMONIA" in the last 8760  hours. CBC: Recent Labs    12/02/21 1009 03/11/22 1002 05/20/22 0904 06/03/22 0925  WBC 3.0* 3.7* 3.3* 3.4*  NEUTROABS 1,431* 1.6*  --  1,336*  HGB 14.1 15.1* 14.3 14.7  HCT 42.4 42.9 42.9 43.5  MCV 100.0 96.4 98* 98.4  PLT 128* 134* 140* 136*   Lipid Panel: Recent Labs    12/02/21 1009 06/03/22 0925  CHOL 114 126  HDL 60 64  LDLCALC 41 47  TRIG 53 66  CHOLHDL 1.9 2.0   No results found for: "HGBA1C"  Procedures since last visit: No results found.  Assessment/Plan  1. Right hand pain -  S/P fall - DG Hand Complete Right -  apply ice pack to right hand  2. Status post fall -  has occasional headache and pain on right hand - CT HEAD WO CONTRAST (5MM); Future - DG Hand Complete Right  3. Paroxysmal A-fib -  took Eliquis this morning  4. Acute post-traumatic headache, not intractable -  instructed to go for imaging - CT HEAD WO CONTRAST (5MM); Future STAT   Labs/tests ordered:  CT head and x-ray of right hand  Next appt:  12/18/2022

## 2022-10-28 ENCOUNTER — Other Ambulatory Visit (HOSPITAL_COMMUNITY): Payer: Self-pay

## 2022-10-28 MED ORDER — ROSUVASTATIN CALCIUM 10 MG PO TABS
10.0000 mg | ORAL_TABLET | Freq: Every day | ORAL | 3 refills | Status: DC
  Filled 2022-10-28: qty 90, 90d supply, fill #0
  Filled 2023-01-20: qty 90, 90d supply, fill #1
  Filled 2023-04-24: qty 90, 90d supply, fill #2
  Filled 2023-07-21: qty 90, 90d supply, fill #3

## 2022-10-28 NOTE — Progress Notes (Signed)
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

## 2022-10-29 ENCOUNTER — Ambulatory Visit (INDEPENDENT_AMBULATORY_CARE_PROVIDER_SITE_OTHER): Payer: PPO | Admitting: Family

## 2022-10-29 ENCOUNTER — Encounter: Payer: Self-pay | Admitting: Family

## 2022-10-29 VITALS — BP 118/82 | HR 77 | Temp 97.8°F | Resp 17 | Ht 67.0 in | Wt 137.0 lb

## 2022-10-29 DIAGNOSIS — G319 Degenerative disease of nervous system, unspecified: Secondary | ICD-10-CM | POA: Diagnosis not present

## 2022-10-29 DIAGNOSIS — D329 Benign neoplasm of meninges, unspecified: Secondary | ICD-10-CM

## 2022-10-29 NOTE — Progress Notes (Unsigned)
Provider: Marlowe Sax FNP-C  Maylon Sailors, Nelda Bucks, NP  Patient Care Team: Artie Mcintyre, Nelda Bucks, NP as PCP - General (Family Medicine) Stanford Breed Denice Bors, MD as PCP - Cardiology (Cardiology) Constance Haw, MD as PCP - Electrophysiology (Cardiology) Molli Posey, MD as Consulting Physician (Obstetrics and Gynecology) Stanford Breed Denice Bors, MD as Consulting Physician (Cardiology) Stephannie Li, [place] (Ophthalmology)  Extended Emergency Contact Information Primary Emergency Contact: Mabey,Tom Address: [address]          [place] [address] [place] Phone: [phone] Relation: Spouse Secondary Emergency Contact: Stephannie Li Address: [address]          [place], [place] [address] [place] Mobile Phone: [phone] Relation: Daughter  Code Status:  Full Code  Goals of care: Advanced Directive information    10/29/2022   10:20 AM  Advanced Directives  Does Patient Have a Medical Advance Directive? Yes  Type of Paramedic of [place];Living will;Out of facility DNR (pink MOST or yellow form)  Does patient want to make changes to medical advance directive? No - Patient declined     Chief Complaint  Patient presents with   Acute Visit    Patient is here to discuss CT scan    HPI:  Pt is a 82 y.o. female seen today for an acute visit to discuss CT scan results.she is here with her Husband.states upset that CT scan results were called over the phone by [place].on chart review,results were reviewed by Genia Plants NP    Past Medical History:  Diagnosis Date   Anxiety    Arthritis    "left knee; left hip; lower back; mild to moderate in right hip" (09/30/2016)   Atrial fibrillation    Chronic lower back pain    Gastritis    Heart palpitations    History of bone density study 2018   History of mammogram 2021   History of MRI 2018   History of Papanicolaou smear of cervix    Hypercholesteremia     Osteopenia    Seasonal allergies    Past Surgical History:  Procedure Laterality Date   ATRIAL FIBRILLATION ABLATION N/A 06/10/2022   Procedure: ATRIAL FIBRILLATION ABLATION;  Surgeon: Constance Haw, MD;  Location: [facility];  Service: Cardiovascular;  Laterality: N/A;   CARDIOVERSION N/A 11/17/2019   Procedure: CARDIOVERSION;  Surgeon: [name], MD;  Location: [facility];  Service: Cardiovascular;  Laterality: N/A;   CARDIOVERSION N/A 04/16/2021   Procedure: CARDIOVERSION;  Surgeon: Skeet Latch, MD;  Location: [facility];  Service: Cardiovascular;  Laterality: N/A;   CARDIOVERSION N/A 08/15/2021   Procedure: CARDIOVERSION;  Surgeon: Janina Mayo, MD;  Location: [facility]COPY;  Service: Cardiovascular;  Laterality: N/A;   COLONOSCOPY     DILATION AND CURETTAGE OF UTERUS     JOINT REPLACEMENT     TONSILLECTOMY     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   Procedure: TOTAL HIP ARTHROPLASTY ANTERIOR APPROACH;  Surgeon: Dorna Leitz, MD;  Location: [facility];  Service: Orthopedics;  Laterality: Left;    Allergies  Allergen Reactions   Nsaids Other (See Comments)    Caused painful stomach problems   Prednisone Other (See Comments)    Caused painful stomach problems    Outpatient Encounter Medications as of 10/29/2022  Medication Sig   acetaminophen (TYLENOL) 325 MG tablet Take 325 mg by mouth every 6 (six) hours as needed for moderate pain or mild pain.   amoxicillin (  AMOXIL) 500 MG capsule Take 4 capsules by mouth 1 hour prior to appointment.   apixaban (ELIQUIS) 2.5 MG TABS tablet Take 1 tablet (2.5 mg total) by mouth 2 (two) times daily.   Brimonidine Tartrate (LUMIFY) 0.025 % SOLN Place 1 drop into both eyes 2 (two) times daily. Red eyes   Carboxymeth-Glyc-Polysorb PF (REFRESH OPTIVE MEGA-3) 0.5-1-0.5 % SOLN Place 1 drop into both eyes 4 (four) times daily as needed (dry eyes). Enhance with flax seed oil   diphenhydramine-acetaminophen (TYLENOL PM) 25-500 MG  TABS tablet Take 1 tablet by mouth at bedtime as needed (Pain/sleep).   ketotifen (ZADITOR) 0.025 % ophthalmic solution Place 1 drop into both eyes 3 (three) times a week. In the fall   loratadine (CLARITIN) 10 MG tablet Take 10 mg by mouth daily as needed for allergies.   melatonin 3 MG TABS tablet Take 3 mg by mouth at bedtime. 1/2 tablet   NON FORMULARY Take 1 tablet by mouth daily. Plant calcium(1000 mg)   Omega-3 Fatty Acids (FISH OIL PO) Take 3 capsules by mouth daily. 2400 mg  1680 EPA TG DHA 550 RTG   OVER THE COUNTER MEDICATION Place 1 application  into both eyes daily. Pure & Clean   rosuvastatin (CRESTOR) 10 MG tablet Take 1 tablet (10 mg total) by mouth daily.   Simethicone (GAS-X PO) Take 1 capsule by mouth daily as needed (gas).   No facility-administered encounter medications on file as of 10/29/2022.    Review of Systems  Immunization History  Administered Date(s) Administered   COVID-19, mRNA, vaccine(Comirnaty)12 years and older 07/31/2022   Fluad Quad(high Dose 65+) 06/03/2022   Influenza Split 04/30/2015, 04/20/2016, 04/27/2018, 06/16/2019   Influenza, High Dose Seasonal PF 06/28/2017, 06/16/2019, 06/02/2020   Influenza-Unspecified 06/19/2021   PFIZER(Purple Top)SARS-COV-2 Vaccination 08/12/2019, 09/02/2019, 05/01/2020, 11/13/2020, 04/12/2021   Pneumococcal Conjugate-13 09/30/2013   Pneumococcal Polysaccharide-23 07/27/2003, 09/24/2010   Td 04/26/2001, 12/25/2017   Tdap 09/24/2010   Zoster Recombinat (Shingrix) 11/16/2017, 01/06/2018   Zoster, Live 12/11/2006, 04/27/2018, 06/29/2018   Pertinent  Health Maintenance Due  Topic Date Due   INFLUENZA VACCINE  02/26/2023   DEXA SCAN  Completed      06/10/2022    5:55 AM 06/17/2022    3:12 PM 08/11/2022    2:30 PM 10/27/2022   11:03 AM 10/29/2022   10:19 AM  Fall Risk  Falls in the past year?  0 0 1 1  Was there an injury with Fall?   0 1 1  Fall Risk Category Calculator   0 3 3  (RETIRED) Patient Fall Risk Level  Low fall risk      Patient at Risk for Falls Due to   No Fall Risks Other (Comment) History of fall(s)  Patient at Risk for Falls Due to - Comments    Patient states that she trip over a doggie gate   Fall risk Follow up   Falls evaluation completed Falls evaluation completed Falls evaluation completed   Functional Status Survey:    Vitals:   10/29/22 1018  BP: 118/82  Pulse: 77  Resp: 17  Temp: 97.8 F (36.6 C)  TempSrc: Temporal  SpO2: 98%  Weight: 137 lb (62.1 kg)  Height: 5\' 7"  (1.702 m)   Body mass index is 21.46 kg/m. Physical Exam  Labs reviewed: Recent Labs    03/11/22 1002 05/20/22 0904 06/03/22 0925  NA 142 145* 141  K 4.1 4.6 4.2  CL 109 106 104  CO2 27 26 30  GLUCOSE 72 84 75  BUN <5* 22 25  CREATININE 0.71 0.92 0.82  CALCIUM 9.4 9.3 9.4   Recent Labs    03/11/22 1002 06/03/22 0925 07/16/22 0804  AST 35 48* 37*  ALT 34 42* 35*  ALKPHOS 54  --   --   BILITOT 0.2* 1.0 0.7  PROT 5.1* 6.1 5.9*  ALBUMIN 3.9  --   --    Recent Labs    12/02/21 1009 03/11/22 1002 05/20/22 0904 06/03/22 0925  WBC 3.0* 3.7* 3.3* 3.4*  NEUTROABS 1,431* 1.6*  --  1,336*  HGB 14.1 15.1* 14.3 14.7  HCT 42.4 42.9 42.9 43.5  MCV 100.0 96.4 98* 98.4  PLT 128* 134* 140* 136*   Lab Results  Component Value Date   TSH 3.28 12/02/2021   No results found for: "HGBA1C" Lab Results  Component Value Date   CHOL 126 06/03/2022   HDL 64 06/03/2022   LDLCALC 47 06/03/2022   TRIG 66 06/03/2022   CHOLHDL 2.0 06/03/2022    Significant Diagnostic Results in last 30 days:  CT HEAD WO CONTRAST (5MM)  Result Date: 10/27/2022 CLINICAL DATA:  Provided history: Headache, posttraumatic. Frequent falls. Right eye bruising. EXAM: CT HEAD WITHOUT CONTRAST TECHNIQUE: Contiguous axial images were obtained from the base of the skull through the vertex without intravenous contrast. RADIATION DOSE REDUCTION: This exam was performed according to the departmental dose-optimization program  which includes automated exposure control, adjustment of the mA and/or kV according to patient size and/or use of iterative reconstruction technique. COMPARISON:  No pertinent prior exams available for comparison. FINDINGS: Brain: Mild for age generalized cerebral atrophy. 17 x 12 mm calcified extra-axial mass overlying the mid left frontal lobe compatible with a meningioma (for instance as seen on series 3, image 52) (series 2, image 21). The mass mildly indents the underlying left frontal lobe without underlying parenchymal edema. Patchy and ill-defined hypoattenuation within the cerebral white matter, nonspecific but compatible with mild chronic small vessel ischemic disease. There is no acute intracranial hemorrhage. No demarcated cortical infarct. No extra-axial fluid collection. No midline shift. Vascular: No hyperdense vessel. Atherosclerotic calcifications. Skull: No fracture or aggressive osseous lesion. Sinuses/Orbits: No mass or acute finding within the imaged orbits. Mild mucosal thickening versus small fluid level within imaged portions of the left maxillary sinus. Mild mucosal thickening scattered within bilateral ethmoid air cells. IMPRESSION: 1. No evidence of acute intracranial hemorrhage or acute infarct. 2. 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 The mass mildly indents the underlying left frontal lobe without underlying parenchymal edema. 3. Mild chronic small vessel ischemic changes within the cerebral white matter. 4. Mild generalized cerebral atrophy. Electronically Signed   By: Kellie Simmering D.O.   On: 10/27/2022 13:01    Assessment/Plan There are no diagnoses linked to this encounter.   Family/ staff Communication: Reviewed plan of care with patient  Labs/tests ordered: None   Next Appointment:   Sandrea Hughs, NP

## 2022-10-30 ENCOUNTER — Telehealth: Payer: Self-pay

## 2022-10-30 NOTE — Telephone Encounter (Signed)
Will send referral to Neurologist then specialist will call you to schedule appointment.

## 2022-10-30 NOTE — Telephone Encounter (Addendum)
Patient called to say that Neurology referral can be sent to who ever you choose. She just wants to be sure they are in network.  Message routed to Marlowe Sax, NP

## 2022-10-31 DIAGNOSIS — Z961 Presence of intraocular lens: Secondary | ICD-10-CM | POA: Diagnosis not present

## 2022-10-31 DIAGNOSIS — H43811 Vitreous degeneration, right eye: Secondary | ICD-10-CM | POA: Diagnosis not present

## 2022-10-31 DIAGNOSIS — H26493 Other secondary cataract, bilateral: Secondary | ICD-10-CM | POA: Diagnosis not present

## 2022-10-31 DIAGNOSIS — H52223 Regular astigmatism, bilateral: Secondary | ICD-10-CM | POA: Diagnosis not present

## 2022-10-31 DIAGNOSIS — H16223 Keratoconjunctivitis sicca, not specified as Sjogren's, bilateral: Secondary | ICD-10-CM | POA: Diagnosis not present

## 2022-11-01 ENCOUNTER — Other Ambulatory Visit (HOSPITAL_COMMUNITY): Payer: Self-pay

## 2022-11-04 ENCOUNTER — Ambulatory Visit: Payer: PPO | Admitting: Neurology

## 2022-11-06 ENCOUNTER — Ambulatory Visit: Payer: PPO | Admitting: Neurology

## 2022-11-06 ENCOUNTER — Encounter: Payer: Self-pay | Admitting: Neurology

## 2022-11-06 ENCOUNTER — Telehealth: Payer: Self-pay | Admitting: Neurology

## 2022-11-06 VITALS — BP 105/52 | HR 49 | Ht 67.0 in | Wt 137.6 lb

## 2022-11-06 DIAGNOSIS — Z9181 History of falling: Secondary | ICD-10-CM | POA: Diagnosis not present

## 2022-11-06 DIAGNOSIS — D329 Benign neoplasm of meninges, unspecified: Secondary | ICD-10-CM | POA: Diagnosis not present

## 2022-11-06 NOTE — Progress Notes (Signed)
Subjective:    Patient ID: Shelia [name] is a 82 y.o. female.  HPI    Huston Foley, MD, PhD [facility] [address], [address] [address] [place], [place] [address]  Dear Carilyn Goodpasture,  I saw your patient, Shelia [name], upon your kind request in my neurologic clinic today for initial consultation of her cerebral meningioma.  The patient is accompanied by her husband today.  As you know, Shelia [name] is an 82 year old female with an underlying medical history of atrial fibrillation, on Eliquis, status post cardioversion and cardiac ablation, allergies, osteopenia, hyperlipidemia, arthritis, and anxiety, who reports a couple of falls recently.  These happened earlier this month, she tripped at her daughter's house twice within a week's time.  She did have sustained bruising on her ribs and a black eye on the left.  This is healing thankfully.  She denies any severe or recurrent headaches, no sudden onset of one-sided weakness or numbness or tingling or droopy face or slurring of speech.  She was found to have a meningioma.  She has not had any problems with her balance, no dizziness or lightheadedness, does not walk with a walking aid.  She hydrates well with water.   I reviewed your office note from 10/29/2022.  She had a recent head CT without contrast on 10/27/2022 and I reviewed the results: IMPRESSION: 1. No evidence of acute intracranial hemorrhage or acute infarct. 2. 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 The mass mildly indents the underlying left frontal lobe without underlying parenchymal edema. 3. Mild chronic small vessel ischemic changes within the cerebral white matter. 4. Mild generalized cerebral atrophy.  She was notified of the test results but has not been referred to neurosurgery.   She has regular eye examinations, she is status post cataract surgeries, her daughter is an optometrist and she has another daughter who is  a Insurance account manager in [place], [place].  Her Past Medical History Is Significant For: Past Medical History:  Diagnosis Date   Anxiety    Arthritis    "left knee; left hip; lower back; mild to moderate in right hip" (09/30/2016)   Atrial fibrillation    Chronic lower back pain    Gastritis    Heart palpitations    History of bone density study 2018   History of mammogram 2021   History of MRI 2018   History of Papanicolaou smear of cervix    Hypercholesteremia    Osteopenia    Seasonal allergies     Her Past Surgical History Is Significant For: Past Surgical History:  Procedure Laterality Date   ATRIAL FIBRILLATION ABLATION N/A 06/10/2022   Procedure: ATRIAL FIBRILLATION ABLATION;  Surgeon: Regan Lemming, MD;  Location: [facility];  Service: Cardiovascular;  Laterality: N/A;   CARDIOVERSION N/A 11/17/2019   Procedure: CARDIOVERSION;  Surgeon: Lewayne Bunting, MD;  Location: [facility] ENDOSCOPY;  Service: Cardiovascular;  Laterality: N/A;   CARDIOVERSION N/A 04/16/2021   Procedure: CARDIOVERSION;  Surgeon: Chilton Si, MD;  Location: [facility] ENDOSCOPY;  Service: Cardiovascular;  Laterality: N/A;   CARDIOVERSION N/A 08/15/2021   Procedure: CARDIOVERSION;  Surgeon: Maisie Fus, MD;  Location: [facility] ENDOSCOPY;  Service: Cardiovascular;  Laterality: N/A;   COLONOSCOPY     DILATION AND CURETTAGE OF UTERUS     JOINT REPLACEMENT     TONSILLECTOMY     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   Procedure: TOTAL HIP ARTHROPLASTY ANTERIOR APPROACH;  Surgeon: Jodi Geralds,  MD;  Location: [facility];  Service: Orthopedics;  Laterality: Left;    Her Family History Is Significant For: Family History  Problem Relation Age of Onset   Hypertension Mother    Cancer Father        lung cancer   Diabetes Sister    Anxiety disorder Daughter    Breast cancer Neg Hx     Her Social History Is Significant For: Social History   Socioeconomic History   Marital status: Married    Spouse name: Not on  file   Number of children: 3   Years of education: Not on file   Highest education level: Not on file  Occupational History   Not on file  Tobacco Use   Smoking status: Former    Packs/day: 2    Types: Cigarettes   Smokeless tobacco: Never   Tobacco comments:    Former smoker 07/08/22  Vaping Use   Vaping Use: Never used  Substance and Sexual Activity   Alcohol use: Not Currently   Drug use: Never   Sexual activity: Yes  Other Topics Concern   Not on file  Social History Narrative   Tobacco use, amount per day now: None.   Past tobacco use, amount per day: Maximum 2 packs    How many years did you use tobacco: Quit 1985   Alcohol use (drinks per week): 3   Diet: Vegetables, Chicken, Fish, Grains, and Fruit.   Do you drink/eat things with caffeine: Coffee   Marital status: Married                                  What year were you married? 1969   Do you live in a house, apartment, assisted living, condo, trailer, etc.? House.   Is it one or more stories? 2 stories.   How many persons live in your home? 2   Do you have pets in your home?( please list) No.   Highest Level of education completed? Masters in Adult Educations.   Current or past profession: Art gallery manager    Do you exercise? Yes.                                  Type and how often? Floor excercises 5 times week. Walk 2 miles 6 times week.   Do you have a living will? Yes   Do you have a DNR form?  Yes                                 If not, do you want to discuss one?   Do you have signed POA/HPOA forms?  Yes                      If so, please bring to you appointment      Do you have any difficulty bathing or dressing yourself? No   Do you have any difficulty preparing food or eating? No   Do you have any difficulty managing your medications? No   Do you have any difficulty managing your finances? No   Do you have any difficulty affording your medications? No   Social Determinants of Health   Financial  Resource Strain: Not on file  Food Insecurity: Not on file  Transportation Needs: Not on file  Physical Activity: Not on file  Stress: Not on file  Social Connections: Not on file    Her Allergies Are:  Allergies  Allergen Reactions   Nsaids Other (See Comments)    Caused painful stomach problems   Prednisone Other (See Comments)    Caused painful stomach problems  :   Her Current Medications Are:  Outpatient Encounter Medications as of 11/06/2022  Medication Sig   acetaminophen (TYLENOL) 325 MG tablet Take 325 mg by mouth every 6 (six) hours as needed for moderate pain or mild pain.   amoxicillin (AMOXIL) 500 MG capsule Take 4 capsules by mouth 1 hour prior to appointment.   apixaban (ELIQUIS) 2.5 MG TABS tablet Take 1 tablet (2.5 mg total) by mouth 2 (two) times daily.   Brimonidine Tartrate (LUMIFY) 0.025 % SOLN Place 1 drop into both eyes 2 (two) times daily. Red eyes   Carboxymeth-Glyc-Polysorb PF (REFRESH OPTIVE MEGA-3) 0.5-1-0.5 % SOLN Place 1 drop into both eyes 4 (four) times daily as needed (dry eyes). Enhance with flax seed oil   diphenhydramine-acetaminophen (TYLENOL PM) 25-500 MG TABS tablet Take 1 tablet by mouth at bedtime as needed (Pain/sleep).   ketotifen (ZADITOR) 0.025 % ophthalmic solution Place 1 drop into both eyes 3 (three) times a week. In the fall   loratadine (CLARITIN) 10 MG tablet Take 10 mg by mouth daily as needed for allergies.   melatonin 3 MG TABS tablet Take 3 mg by mouth at bedtime. 1/2 tablet   NON FORMULARY Take 1 tablet by mouth daily. Plant calcium(1000 mg)   Omega-3 Fatty Acids (FISH OIL PO) Take 3 capsules by mouth daily. 2400 mg  1680 EPA TG DHA 550 RTG   OVER THE COUNTER MEDICATION Place 1 application  into both eyes daily. Pure & Clean   rosuvastatin (CRESTOR) 10 MG tablet Take 1 tablet (10 mg total) by mouth daily.   Simethicone (GAS-X PO) Take 1 capsule by mouth daily as needed (gas).   No facility-administered encounter medications  on file as of 11/06/2022.  :   Review of Systems:  Out of a complete 14 point review of systems, all are reviewed and negative with the exception of these symptoms as listed below:  Review of Systems  Neurological:        Pt here for Meningioma, mild cerebral atrophy Pt states fell and hit head  Pt went to PCP few days after fall . Pt states few headaches,rib pain     Objective:  Neurological Exam  Physical Exam Physical Examination:   Vitals:   11/06/22 1441  BP: (!) 105/52  Pulse: (!) 49    General Examination: The patient is a very pleasant 82 y.o. female in no acute distress. She appears well-developed and well-nourished and well groomed.   HEENT: Normocephalic, atraumatic, pupils are equal, round and reactive to light, extraocular tracking is good without limitation to gaze excursion or nystagmus noted.  Status post bilateral cataract repairs.  Hearing is grossly intact. Face is symmetric with normal facial animation. Speech is clear with no dysarthria noted. There is no hypophonia. There is no lip, neck/head, jaw or voice tremor. Neck is supple with full range of passive and active motion. There are no carotid bruits on auscultation. Oropharynx exam reveals: moderate mouth dryness, good dental hygiene. Tongue protrudes centrally and palate elevates symmetrically.  Healing bruise below the left eye, upper left cheek area.   Chest: Clear to auscultation without wheezing, rhonchi or  crackles noted.  Heart: S1+S2+0, regular and normal without murmurs, rubs or gallops noted.   Abdomen: Soft, non-tender and non-distended.  Extremities: There is no pitting edema in the distal lower extremities bilaterally.   Skin: Warm and dry without trophic changes noted.   Musculoskeletal: exam reveals no obvious joint deformities.   Neurologically:  Mental status: The patient is awake, alert and oriented in all 4 spheres. Her immediate and remote memory, attention, language skills and fund  of knowledge are appropriate. There is no evidence of aphasia, agnosia, apraxia or anomia. Speech is clear with normal prosody and enunciation. Thought process is linear. Mood is normal and affect is normal.  Cranial nerves II - XII are as described above under HEENT exam.  Motor exam: Normal bulk, strength and tone is noted. There is no obvious action or resting tremor.  Fine motor skills and coordination: Normal finger taps, normal hand movements and rapid alternating patting in both upper extremities, normal foot taps bilaterally. Reflexes 1+ in the upper extremities, trace in the lower extremities. Cerebellar testing: No dysmetria or intention tremor. There is no truncal or gait ataxia.  Normal finger-to-nose and normal heel-to-shin bilaterally. Sensory exam: intact to light touch in the upper and lower extremities.  Gait, station and balance: She stands easily. No veering to one side is noted. No leaning to one side is noted. Posture is age-appropriate and stance is narrow based. Gait shows normal stride length and normal pace. No problems turning are noted.   Assessment and Plan:  In summary, Shelia [name] is a very pleasant 82 y.o.-year old female with an underlying medical history of atrial fibrillation, on Eliquis, status post cardioversion and cardiac ablation, allergies, osteopenia, hyperlipidemia, arthritis, and anxiety, who presents for evaluation of her left frontal meningioma which was found incidentally on a head CT scan after her falls.  She has no sinister neurological symptoms and neurological exam is nonfocal.  She is largely reassured.  Nevertheless, she is advised that meningioma can grow over time and should be monitored with serial MRIs.  I will go ahead and order an MRI and we may want to do another MRI in 6 months, this can be done through your office or neurosurgery even.  We will make a referral to neurosurgery so she can have a consultation, she is advised that she will  likely not need any surgery at least at this point in time.  She is advised to continue to stay well-hydrated and maintain healthy lifestyle.  She does not have any severe headaches or strokelike symptoms.  She is advised to follow-up with her current providers as scheduled, I would be happy to see her back as needed.  She was offered a 15-month follow-up but would like to decide after her consultation with the neurosurgeon.  We will be in touch as to her brain MRI results by phone call in the interim.  She is planning to be out of town in mid June onwards.  They are planning a trip to [place] as well. We should be able to do the brain MRI before her trip and I will request that she be seen by neurosurgery also before her travel.  I answered all their questions today and the patient and her husband were in agreement.   Thank you very much for allowing me to participate in the care of this nice patient. If I can be of any further assistance to you please do not hesitate to call me at [phone].  Sincerely,   Star Age, MD, PhD

## 2022-11-06 NOTE — Telephone Encounter (Signed)
[employer] NPR sent to GI [phone] 

## 2022-11-06 NOTE — Patient Instructions (Signed)
A meningioma is usually considered a benign tumor that arises from the lining of the brain.  In most instances it is something that can be monitored with serial MRI scans, usually once a year.  Often, the meningioma is found as an incidental finding and it may never cause any clinical symptoms.  Nevertheless, we usually recommend evaluation with a neurosurgeon in case the meningioma does grow over time and put pressure on surrounding areas of the brain.

## 2022-11-10 ENCOUNTER — Telehealth: Payer: Self-pay | Admitting: Neurology

## 2022-11-10 NOTE — Telephone Encounter (Signed)
Neurosurgery referral faxed to [facility] (fax# [phone], phone# [phone])

## 2022-11-20 ENCOUNTER — Other Ambulatory Visit (HOSPITAL_COMMUNITY): Payer: Self-pay

## 2022-12-01 ENCOUNTER — Ambulatory Visit
Admission: RE | Admit: 2022-12-01 | Discharge: 2022-12-01 | Disposition: A | Discharge status: Home / Self Care | Payer: PPO | Source: Ambulatory Visit | Attending: Neurology | Admitting: Neurology

## 2022-12-01 DIAGNOSIS — Z9181 History of falling: Secondary | ICD-10-CM | POA: Diagnosis not present

## 2022-12-01 DIAGNOSIS — D329 Benign neoplasm of meninges, unspecified: Secondary | ICD-10-CM | POA: Diagnosis not present

## 2022-12-01 MED ORDER — GADOPICLENOL 0.5 MMOL/ML IV SOLN
7.0000 mL | Freq: Once | INTRAVENOUS | Status: AC | PRN
  Administered 2022-12-01: 7 mL via INTRAVENOUS

## 2022-12-02 ENCOUNTER — Telehealth: Payer: Self-pay | Admitting: *Deleted

## 2022-12-02 DIAGNOSIS — H16223 Keratoconjunctivitis sicca, not specified as Sjogren's, bilateral: Secondary | ICD-10-CM | POA: Diagnosis not present

## 2022-12-02 DIAGNOSIS — H26493 Other secondary cataract, bilateral: Secondary | ICD-10-CM | POA: Diagnosis not present

## 2022-12-02 NOTE — Telephone Encounter (Signed)
-----   Message from Huston Foley, MD sent at 12/01/2022  3:16 PM EDT ----- Please advise patient or her husband that her brain MRI showed the meningioma, no other acute or sinister findings.  As discussed, she can follow-up with primary care and I have made a referral to neurosurgery, as we discussed during the appointment.

## 2022-12-02 NOTE — Progress Notes (Signed)
Lm tcb 5-7 sy

## 2022-12-02 NOTE — Telephone Encounter (Signed)
Pr returned call and I gave her the results per Dr. Lucia Gaskins.  She verbalized understanding and already relayed she has appt with neurosurgery tomorrow with Dr. Petra Kuba.  She appreciated call back

## 2022-12-02 NOTE — Progress Notes (Signed)
Lmvm tcb 5/7 sy

## 2022-12-08 DIAGNOSIS — D329 Benign neoplasm of meninges, unspecified: Secondary | ICD-10-CM | POA: Diagnosis not present

## 2022-12-18 ENCOUNTER — Other Ambulatory Visit: Payer: PPO

## 2022-12-18 DIAGNOSIS — D696 Thrombocytopenia, unspecified: Secondary | ICD-10-CM | POA: Diagnosis not present

## 2022-12-18 DIAGNOSIS — E785 Hyperlipidemia, unspecified: Secondary | ICD-10-CM

## 2022-12-18 DIAGNOSIS — I4819 Other persistent atrial fibrillation: Secondary | ICD-10-CM

## 2022-12-18 DIAGNOSIS — R748 Abnormal levels of other serum enzymes: Secondary | ICD-10-CM | POA: Diagnosis not present

## 2022-12-18 LAB — CBC WITH DIFFERENTIAL/PLATELET
Eosinophils Absolute: 109 cells/uL (ref 15–500)
MCHC: 33 g/dL (ref 32.0–36.0)
Neutro Abs: 1411 cells/uL — ABNORMAL LOW (ref 1500–7800)
RBC: 4.43 10*6/uL (ref 3.80–5.10)

## 2022-12-18 NOTE — Progress Notes (Signed)
Cardiology Clinic Note   Date: 12/19/2022 ID: Shelia [name], DOB 09-19-1940, MRN [id number]  Primary Cardiologist:  Olga Millers, MD  Patient Profile    Shelia [name] is a 82 y.o. female who presents to the clinic today for 6 month follow up.   Past medical history significant for: Nonobstructive CAD. Coronary CTA with FFR 04/18/2021: Coronary calcium score 192 (63rd percentile).  Calcified plaque (25 to 49%) proximal LAD.  No significant stenosis per FFR. PAF. Onset 2021. DCCV 11/17/2019. DCCV 04/16/2021. Echo 08/27/2021: EF 60 to 65%.  Normal RV function.  Moderate BAE.  Mild MR.  Dilated IVC.  RA pressure 15 mmHg.  No significant change from prior study April 2021. A-fib ablation 06/10/2022.   History of Present Illness    Shelia [name] was first evaluated by Dr. Jens Som on 10/26/2019 for A-fib at the request of Dr. Valentina Lucks.  She continues to be followed by Dr. Jens Som [facility] for the above outlined history.  Patient was last seen in the office by Dr. Jens Som on 06/17/2022 for follow-up.  She was doing well at that time and no changes were made.   Patient last saw Dr. Elberta Fortis on 09/08/2022.  She was maintaining sinus rhythm and did not complain of further episodes of A-fib.  No changes were made.  Today, patient is here alone. She denies shortness of breath or dyspnea on exertion. No chest pain, pressure, or tightness. Denies lower extremity edema, orthopnea, or PND. No palpitations. She stays active doing strength exercises 4 times a week and walking 5 times a week (weather permitting). Denies blood in stool or urine or other bleeding complaints.     ROS: All other systems reviewed and are otherwise negative except as noted in History of Present Illness.  Studies Reviewed    ECG is not ordered today.   Risk Assessment/Calculations     CHA2DS2-VASc Score = 3   This indicates a 3.2% annual risk of stroke. The patient's score is based  upon: CHF History: 0 HTN History: 0 Diabetes History: 0 Stroke History: 0 Vascular Disease History: 0 Age Score: 2 Gender Score: 1             Physical Exam    VS:  BP 102/66   Pulse (!) 52   Ht 5\' 7"  (1.702 m)   Wt 136 lb 9.6 oz (62 kg)   SpO2 97%   BMI 21.39 kg/m  , BMI Body mass index is 21.39 kg/m.  GEN: Well nourished, well developed, in no acute distress. Neck: No JVD or carotid bruits. Cardiac:  RRR. No murmurs. No rubs or gallops.   Respiratory:  Respirations regular and unlabored. Clear to auscultation without rales, wheezing or rhonchi. GI: Soft, nontender, nondistended. Extremities: Radials/DP/PT 2+ and equal bilaterally. No clubbing or cyanosis. No edema.  Skin: Warm and dry, no rash. Neuro: Strength intact.  Assessment & Plan    Nonobstructive CAD.  Coronary calcium score 192 September 2022, no significant stenosis per FFR.  Patient denies chest pain, pressure, tightness.  Continue rosuvastatin. PAF.  Onset 2021.  DCCV in 2021 and 2022.  A-fib ablation November 2023.  Patient denies palpitations.  RRR on exam today. Denies spontaneous bleeding concerns.  Continue Eliquis.   Disposition: Return in 8 months or sooner as needed. Patient's weight is >60 kg. Will contact her via My Chart regarding increasing dose of Eliquis to 5 mg.          Signed, Etta Grandchild. Syenna Nazir,  DNP, NP-C

## 2022-12-19 ENCOUNTER — Other Ambulatory Visit (HOSPITAL_COMMUNITY): Payer: Self-pay

## 2022-12-19 ENCOUNTER — Encounter: Payer: Self-pay | Admitting: Student

## 2022-12-19 ENCOUNTER — Ambulatory Visit: Payer: PPO | Attending: Student | Admitting: Student

## 2022-12-19 ENCOUNTER — Other Ambulatory Visit: Payer: Self-pay | Admitting: Cardiology

## 2022-12-19 VITALS — BP 102/66 | HR 52 | Ht 67.0 in | Wt 136.6 lb

## 2022-12-19 DIAGNOSIS — I480 Paroxysmal atrial fibrillation: Secondary | ICD-10-CM

## 2022-12-19 DIAGNOSIS — I2510 Atherosclerotic heart disease of native coronary artery without angina pectoris: Secondary | ICD-10-CM

## 2022-12-19 LAB — CBC WITH DIFFERENTIAL/PLATELET
Absolute Monocytes: 459 cells/uL (ref 200–950)
Basophils Absolute: 32 cells/uL (ref 0–200)
Basophils Relative: 0.9 %
Eosinophils Relative: 3.1 %
HCT: 43.7 % (ref 35.0–45.0)
Hemoglobin: 14.4 g/dL (ref 11.7–15.5)
Lymphs Abs: 1491 cells/uL (ref 850–3900)
MCH: 32.5 pg (ref 27.0–33.0)
MCV: 98.6 fL (ref 80.0–100.0)
MPV: 10.5 fL (ref 7.5–12.5)
Monocytes Relative: 13.1 %
Neutrophils Relative %: 40.3 %
Platelets: 144 10*3/uL (ref 140–400)
RDW: 11.3 % (ref 11.0–15.0)
Total Lymphocyte: 42.6 %
WBC: 3.5 10*3/uL — ABNORMAL LOW (ref 3.8–10.8)

## 2022-12-19 LAB — COMPLETE METABOLIC PANEL WITH GFR
AG Ratio: 2.4 (calc) (ref 1.0–2.5)
ALT: 19 U/L (ref 6–29)
AST: 33 U/L (ref 10–35)
Albumin: 4.3 g/dL (ref 3.6–5.1)
Alkaline phosphatase (APISO): 59 U/L (ref 37–153)
BUN/Creatinine Ratio: 35 (calc) — ABNORMAL HIGH (ref 6–22)
BUN: 26 mg/dL — ABNORMAL HIGH (ref 7–25)
CO2: 29 mmol/L (ref 20–32)
Calcium: 9.1 mg/dL (ref 8.6–10.4)
Chloride: 107 mmol/L (ref 98–110)
Creat: 0.75 mg/dL (ref 0.60–0.95)
Globulin: 1.8 g/dL (calc) — ABNORMAL LOW (ref 1.9–3.7)
Glucose, Bld: 77 mg/dL (ref 65–99)
Potassium: 4.1 mmol/L (ref 3.5–5.3)
Sodium: 142 mmol/L (ref 135–146)
Total Bilirubin: 0.9 mg/dL (ref 0.2–1.2)
Total Protein: 6.1 g/dL (ref 6.1–8.1)
eGFR: 80 mL/min/{1.73_m2} (ref >=60)

## 2022-12-19 LAB — LIPID PANEL
Cholesterol: 144 mg/dL (ref <200)
HDL: 64 mg/dL (ref >=50)
LDL Cholesterol (Calc): 66 mg/dL (calc)
Non-HDL Cholesterol (Calc): 80 mg/dL (calc) (ref <130)
Total CHOL/HDL Ratio: 2.3 (calc) (ref <5.0)
Triglycerides: 59 mg/dL (ref <150)

## 2022-12-19 LAB — HEPATIC FUNCTION PANEL
AG Ratio: 2.4 (calc) (ref 1.0–2.5)
ALT: 19 U/L (ref 6–29)
AST: 33 U/L (ref 10–35)
Albumin: 4.3 g/dL (ref 3.6–5.1)
Alkaline phosphatase (APISO): 59 U/L (ref 37–153)
Bilirubin, Direct: 0.2 mg/dL (ref 0.0–0.2)
Globulin: 1.8 g/dL (calc) — ABNORMAL LOW (ref 1.9–3.7)
Indirect Bilirubin: 0.7 mg/dL (calc) (ref 0.2–1.2)
Total Bilirubin: 0.9 mg/dL (ref 0.2–1.2)
Total Protein: 6.1 g/dL (ref 6.1–8.1)

## 2022-12-19 LAB — TSH: TSH: 3.36 mIU/L (ref 0.40–4.50)

## 2022-12-19 MED ORDER — APIXABAN 5 MG PO TABS
5.0000 mg | ORAL_TABLET | Freq: Two times a day (BID) | ORAL | 5 refills | Status: DC
  Filled 2022-12-19: qty 60, 30d supply, fill #0
  Filled 2023-01-20: qty 60, 30d supply, fill #1
  Filled 2023-02-14 (×2): qty 60, 30d supply, fill #2
  Filled 2023-03-19 – 2023-03-20 (×2): qty 60, 30d supply, fill #3
  Filled 2023-04-24: qty 60, 30d supply, fill #4
  Filled 2023-05-24: qty 60, 30d supply, fill #5

## 2022-12-19 NOTE — Patient Instructions (Signed)
Medication Instructions:  Your physician recommends that you continue on your current medications as directed. Please refer to the Current Medication list given to you today.  *If you need a refill on your cardiac medications before your next appointment, please call your pharmacy*   Lab Work: NONE If you have labs (blood work) drawn today and your tests are completely normal, you will receive your results only by: MyChart Message (if you have MyChart) OR A paper copy in the mail If you have any lab test that is abnormal or we need to change your treatment, we will call you to review the results.   Testing/Procedures: NONE   Follow-Up: At [facility], you and your health needs are our priority.  As part of our continuing mission to provide you with exceptional heart care, we have created designated Provider Care Teams.  These Care Teams include your primary Cardiologist (physician) and Advanced Practice Providers (APPs -  Physician Assistants and Nurse Practitioners) who all work together to provide you with the care you need, when you need it.  We recommend signing up for the patient portal called "MyChart".  Sign up information is provided on this After Visit Summary.  MyChart is used to connect with patients for Virtual Visits (Telemedicine).  Patients are able to view lab/test results, encounter notes, upcoming appointments, etc.  Non-urgent messages can be sent to your provider as well.   To learn more about what you can do with MyChart, go to [email or web address].    Your next appointment:   8 month(s)  Provider:   Olga Millers, MD

## 2022-12-19 NOTE — Telephone Encounter (Signed)
Prescription refill request for Eliquis received. Indication:afib Last office visit:2/24 Scr:0.7 Age: 82 Weight:62.4  kg  Under review

## 2022-12-23 ENCOUNTER — Other Ambulatory Visit (HOSPITAL_COMMUNITY): Payer: Self-pay

## 2022-12-23 ENCOUNTER — Encounter: Payer: Self-pay | Admitting: Family

## 2022-12-23 ENCOUNTER — Ambulatory Visit (INDEPENDENT_AMBULATORY_CARE_PROVIDER_SITE_OTHER): Payer: PPO | Admitting: Family

## 2022-12-23 VITALS — BP 116/62 | HR 52 | Ht 67.0 in | Wt 135.0 lb

## 2022-12-23 DIAGNOSIS — L989 Disorder of the skin and subcutaneous tissue, unspecified: Secondary | ICD-10-CM | POA: Diagnosis not present

## 2022-12-23 DIAGNOSIS — F1021 Alcohol dependence, in remission: Secondary | ICD-10-CM | POA: Diagnosis not present

## 2022-12-23 DIAGNOSIS — I480 Paroxysmal atrial fibrillation: Secondary | ICD-10-CM

## 2022-12-23 DIAGNOSIS — R413 Other amnesia: Secondary | ICD-10-CM | POA: Diagnosis not present

## 2022-12-23 DIAGNOSIS — E785 Hyperlipidemia, unspecified: Secondary | ICD-10-CM

## 2022-12-23 DIAGNOSIS — D709 Neutropenia, unspecified: Secondary | ICD-10-CM | POA: Diagnosis not present

## 2022-12-23 NOTE — Progress Notes (Signed)
Provider: Richarda Blade FNP-C   Jawan Chavarria, [name], NP  Patient Care Team: Kenston Longton, [name], NP as PCP - General (Family Medicine) Jens Som Madolyn Frieze, MD as PCP - Cardiology (Cardiology) Regan Lemming, MD as PCP - Electrophysiology (Cardiology) Richarda Overlie, MD as Consulting Physician (Obstetrics and Gynecology) Jens Som Madolyn Frieze, MD as Consulting Physician (Cardiology) Rodrigo Ran, [place] (Ophthalmology)  Extended Emergency Contact Information Primary Emergency Contact: Aspinall,Tom Address: [phone] [facility] [address]           [address] [place] Phone: [phone] Relation: Spouse Secondary Emergency Contact: Rodrigo Ran Address: [address]          [place], [place] [address] [place] Mobile Phone: [phone] Relation: Daughter  Code Status:  Full Code  Goals of care: Advanced Directive information    10/29/2022   10:20 AM  Advanced Directives  Does Patient Have a Medical Advance Directive? Yes  Type of Estate agent of [place];Living will;Out of facility DNR (pink MOST or yellow form)  Does patient want to make changes to medical advance directive? No - Patient declined     Chief Complaint  Patient presents with   Medical Management of Chronic Issues    Patient presents today for a 6 month follow-up   Quality Metric Gaps    COVID#7    HPI:  Pt is a 82 y.o. female seen today for 6 months follow up for medical management of chronic diseases. Has a medical history of chronic Afib,Hyperlipidemia ,CAD,meningioma ,mild cerebral atrophy,Generalized anxiety disorder,hx of Falls,Osteoarthritis among others. She complain of brown spots on her legs.No changes in color or size. Has had more anxiety but declines medication.  Past Medical History:  Diagnosis Date   Anxiety    Arthritis    "left knee; left hip; lower back; mild to moderate in right hip" (09/30/2016)   Atrial fibrillation (HCC)     Chronic lower back pain    Gastritis    Heart palpitations    History of bone density study 2018   History of mammogram 2021   History of MRI 2018   History of Papanicolaou smear of cervix    Hypercholesteremia    Osteopenia    Seasonal allergies    Past Surgical History:  Procedure Laterality Date   ATRIAL FIBRILLATION ABLATION N/A 06/10/2022   Procedure: ATRIAL FIBRILLATION ABLATION;  Surgeon: Regan Lemming, MD;  Location: [facility] INVASIVE CV LAB;  Service: Cardiovascular;  Laterality: N/A;   CARDIOVERSION N/A 11/17/2019   Procedure: CARDIOVERSION;  Surgeon: Lewayne Bunting, MD;  Location: [facility] ENDOSCOPY;  Service: Cardiovascular;  Laterality: N/A;   CARDIOVERSION N/A 04/16/2021   Procedure: CARDIOVERSION;  Surgeon: Chilton Si, MD;  Location: [facility] ENDOSCOPY;  Service: Cardiovascular;  Laterality: N/A;   CARDIOVERSION N/A 08/15/2021   Procedure: CARDIOVERSION;  Surgeon: Maisie Fus, MD;  Location: [facility] ENDOSCOPY;  Service: Cardiovascular;  Laterality: N/A;   COLONOSCOPY     DILATION AND CURETTAGE OF UTERUS     JOINT REPLACEMENT     TONSILLECTOMY     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   Procedure: TOTAL HIP ARTHROPLASTY ANTERIOR APPROACH;  Surgeon: Jodi Geralds, MD;  Location: [facility] OR;  Service: Orthopedics;  Laterality: Left;    Allergies  Allergen Reactions   Nsaids Other (See Comments)    Caused painful stomach problems   Prednisone Other (See Comments)    Caused painful stomach problems    Allergies as of 12/23/2022  Reactions   Nsaids Other (See Comments)   Caused painful stomach problems   Prednisone Other (See Comments)   Caused painful stomach problems        Medication List        Accurate as of Dec 23, 2022  3:41 PM. If you have any questions, ask your nurse or doctor.          acetaminophen 325 MG tablet Commonly known as: TYLENOL Take 325 mg by mouth every 6 (six) hours as needed for moderate pain or mild pain.   amoxicillin 500 MG  capsule Commonly known as: AMOXIL Take 4 capsules by mouth 1 hour prior to appointment.   apixaban 5 MG Tabs tablet Commonly known as: ELIQUIS Take 1 tablet (5 mg total) by mouth 2 (two) times daily.   diphenhydramine-acetaminophen 25-500 MG Tabs tablet Commonly known as: TYLENOL PM Take 1 tablet by mouth at bedtime as needed (Pain/sleep).   FISH OIL PO Take 3 capsules by mouth daily. 2400 mg  1680 EPA TG DHA 550 RTG   GAS-X PO Take 1 capsule by mouth daily as needed (gas).   ketotifen 0.025 % ophthalmic solution Commonly known as: ZADITOR Place 1 drop into both eyes 3 (three) times a week. In the fall   loratadine 10 MG tablet Commonly known as: CLARITIN Take 10 mg by mouth daily as needed for allergies.   Lumify 0.025 % Soln Generic drug: Brimonidine Tartrate Place 1 drop into both eyes 2 (two) times daily. Red eyes   melatonin 3 MG Tabs tablet Take 3 mg by mouth at bedtime. 1/2 tablet   NON FORMULARY Take 1 tablet by mouth daily. Plant calcium(1000 mg)   OVER THE COUNTER MEDICATION Place 1 application  into both eyes daily. Pure & Clean   Refresh Optive Mega-3 0.5-1-0.5 % Soln Generic drug: Carboxymeth-Glyc-Polysorb PF Place 1 drop into both eyes 4 (four) times daily as needed (dry eyes). Enhance with flax seed oil   rosuvastatin 10 MG tablet Commonly known as: CRESTOR Take 1 tablet (10 mg total) by mouth daily.        Review of Systems  Constitutional:  Negative for appetite change, chills, fatigue, fever and unexpected weight change.  HENT:  Negative for congestion, dental problem, ear discharge, ear pain, facial swelling, hearing loss, nosebleeds, postnasal drip, rhinorrhea, sinus pressure, sinus pain, sneezing, sore throat, tinnitus and trouble swallowing.   Eyes:  Negative for pain, discharge, redness, itching and visual disturbance.  Respiratory:  Negative for cough, chest tightness, shortness of breath and wheezing.   Cardiovascular:  Negative for  chest pain, palpitations and leg swelling.  Gastrointestinal:  Negative for abdominal distention, abdominal pain, blood in stool, constipation, diarrhea, nausea and vomiting.  Endocrine: Negative for cold intolerance, heat intolerance, polydipsia, polyphagia and polyuria.  Genitourinary:  Negative for difficulty urinating, dysuria, flank pain, frequency and urgency.  Musculoskeletal:  Negative for arthralgias, back pain, gait problem, joint swelling, myalgias, neck pain and neck stiffness.  Skin:  Negative for color change, pallor, rash and wound.       Brownish spot on the legs   Neurological:  Negative for dizziness, syncope, speech difficulty, weakness, light-headedness, numbness and headaches.  Hematological:  Does not bruise/bleed easily.  Psychiatric/Behavioral:  Negative for agitation, behavioral problems, confusion, hallucinations, self-injury, sleep disturbance and suicidal ideas. The patient is nervous/anxious.     Immunization History  Administered Date(s) Administered   COVID-19, mRNA, vaccine(Comirnaty)12 years and older 07/31/2022   Fluad Quad(high Dose 65+) 06/03/2022  Influenza Split 04/30/2015, 04/20/2016, 04/27/2018, 06/16/2019   Influenza, High Dose Seasonal PF 06/28/2017, 06/16/2019, 06/02/2020   Influenza-Unspecified 06/19/2021   PFIZER(Purple Top)SARS-COV-2 Vaccination 08/12/2019, 09/02/2019, 05/01/2020, 11/13/2020, 04/12/2021   Pneumococcal Conjugate-13 09/30/2013   Pneumococcal Polysaccharide-23 07/27/2003, 09/24/2010   Td 04/26/2001, 12/25/2017   Tdap 09/24/2010   Zoster Recombinat (Shingrix) 11/16/2017, 01/06/2018   Zoster, Live 12/11/2006, 04/27/2018, 06/29/2018   Pertinent  Health Maintenance Due  Topic Date Due   INFLUENZA VACCINE  02/26/2023   DEXA SCAN  Completed      06/17/2022    3:12 PM 08/11/2022    2:30 PM 10/27/2022   11:03 AM 10/29/2022   10:19 AM 12/23/2022    3:02 PM  Fall Risk  Falls in the past year? 0 0 1 1 1   Was there an injury with  Fall?  0 1 1 0  Fall Risk Category Calculator  0 3 3 2   Patient at Risk for Falls Due to  No Fall Risks Other (Comment) History of fall(s) History of fall(s)  Patient at Risk for Falls Due to - Comments   Patient states that she trip over a doggie gate    Fall risk Follow up  Falls evaluation completed Falls evaluation completed Falls evaluation completed Falls evaluation completed   Functional Status Survey:    Vitals:   12/23/22 1457  BP: 116/62  Pulse: (!) 52  SpO2: 96%  Weight: 135 lb (61.2 kg)  Height: 5\' 7"  (1.702 m)   Body mass index is 21.14 kg/m. Physical Exam Vitals reviewed.  Constitutional:      General: She is not in acute distress.    Appearance: Normal appearance. She is normal weight. She is not ill-appearing or diaphoretic.  HENT:     Head: Normocephalic.     Right Ear: Tympanic membrane, ear canal and external ear normal. There is no impacted cerumen.     Left Ear: Tympanic membrane, ear canal and external ear normal. There is no impacted cerumen.     Nose: Nose normal. No congestion or rhinorrhea.     Mouth/Throat:     Mouth: Mucous membranes are moist.     Pharynx: Oropharynx is clear. No oropharyngeal exudate or posterior oropharyngeal erythema.  Eyes:     General: No scleral icterus.       Right eye: No discharge.        Left eye: No discharge.     Extraocular Movements: Extraocular movements intact.     Conjunctiva/sclera: Conjunctivae normal.     Pupils: Pupils are equal, round, and reactive to light.  Neck:     Vascular: No carotid bruit.  Cardiovascular:     Rate and Rhythm: Normal rate and regular rhythm.     Pulses: Normal pulses.     Heart sounds: Normal heart sounds. No murmur heard.    No friction rub. No gallop.  Pulmonary:     Effort: Pulmonary effort is normal. No respiratory distress.     Breath sounds: Normal breath sounds. No wheezing, rhonchi or rales.  Chest:     Chest wall: No tenderness.  Abdominal:     General: Bowel sounds  are normal. There is no distension.     Palpations: Abdomen is soft. There is no mass.     Tenderness: There is no abdominal tenderness. There is no right CVA tenderness, left CVA tenderness, guarding or rebound.  Musculoskeletal:        General: No swelling or tenderness. Normal range of motion.  Cervical back: Normal range of motion. No rigidity or tenderness.     Right lower leg: No edema.     Left lower leg: No edema.  Lymphadenopathy:     Cervical: No cervical adenopathy.  Skin:    General: Skin is warm and dry.     Coloration: Skin is not pale.     Findings: No bruising, erythema, lesion or rash.  Neurological:     Mental Status: She is alert and oriented to person, place, and time.     Cranial Nerves: No cranial nerve deficit.     Sensory: No sensory deficit.     Motor: No weakness.     Coordination: Coordination normal.     Gait: Gait normal.  Psychiatric:        Mood and Affect: Mood normal.        Speech: Speech normal.        Behavior: Behavior normal.        Thought Content: Thought content normal.        Judgment: Judgment normal.     Labs reviewed: Recent Labs    05/20/22 0904 06/03/22 0925 12/18/22 0842  NA 145* 141 142  K 4.6 4.2 4.1  CL 106 104 107  CO2 26 30 29   GLUCOSE 84 75 77  BUN 22 25 26*  CREATININE 0.92 0.82 0.75  CALCIUM 9.3 9.4 9.1   Recent Labs    03/11/22 1002 06/03/22 0925 07/16/22 0804 12/18/22 0842  AST 35 48* 37* 33  33  ALT 34 42* 35* 19  19  ALKPHOS 54  --   --   --   BILITOT 0.2* 1.0 0.7 0.9  0.9  PROT 5.1* 6.1 5.9* 6.1  6.1  ALBUMIN 3.9  --   --   --    Recent Labs    03/11/22 1002 05/20/22 0904 06/03/22 0925 12/18/22 0842  WBC 3.7* 3.3* 3.4* 3.5*  NEUTROABS 1.6*  --  1,336* 1,411*  HGB 15.1* 14.3 14.7 14.4  HCT 42.9 42.9 43.5 43.7  MCV 96.4 98* 98.4 98.6  PLT 134* 140* 136* 144   Lab Results  Component Value Date   TSH 3.36 12/18/2022   No results found for: "HGBA1C" Lab Results  Component Value  Date   CHOL 144 12/18/2022   HDL 64 12/18/2022   LDLCALC 66 12/18/2022   TRIG 59 12/18/2022   CHOLHDL 2.3 12/18/2022    Significant Diagnostic Results in last 30 days:  MR BRAIN W WO CONTRAST  Result Date: 12/01/2022  [facility] [address], [address] [place], [place] [address] [phone] NEUROIMAGING REPORT STUDY DATE: 12/01/2022 PATIENT NAME: LAQUILLA ANGERMEIER DOB: August 22, 1940 MRN: [id number] ORDERING CLINICIAN: Dr. Frances Furbish CLINICAL HISTORY: 82 year old patient being evaluated for meningioma COMPARISON FILMS: CT head without 10/27/2022 EXAM: MRI brain with and without contrast TECHNIQUE: Sagittal T1, axial T1, T2, FLAIR, DWI, ADC map, SWI, coronal T2 and postcontrast axial and coronal T1 images were obtained through the brain CONTRAST: 7 mL IV gadopiclenol IMAGING SITE: [facility] FINDINGS: The brain parenchyma shows mild age-related changes of chronic small vessel disease and generalized cerebral atrophy.  There is a 1.6 x 1.3 x 1.9 cm(AP x T x V ) left frontal convexity meningioma with a broad dural base which shows intense homogeneous enhancement and has well-circumscribed margins without any surrounding cytotoxic edema.  This appears to be heavily calcified as seen on SWI images.  No other structural lesion, tumor or infarct is noted.  Diffusion-weighted imaging is negative for acute ischemia.  Subarachnoid spaces and ventricular system appear normal.  Cortical sulci and gyri show normal appearance.   Extra-axial brain structures appear normal except for the meningioma.  Orbits appear unremarkable.  Paranasal sinuses show severe chronic inflammatory changes in the left maxillary sinus.  The pituitary gland and cerebellar tonsils appear normal.  Visualized portion of the cervical spine shows no significant abnormalities.  Flow-voids of large vessels are intact and circulation appear to be patent.  Postcontrast images do not result in abnormal areas of enhancement except for the  meningeal     MRI scan of the brain with and without contrast showing 1.6 x 1.3 x 1.9 cm left posterior frontal convexity meningioma without significant underlying cytotoxic edema or midline shift.  Mild age-related changes of chronic small vessel disease and generalized cerebral atrophy are noted as well.  No acute abnormality. INTERPRETING PHYSICIAN: Delia Heady, MD Certified in  Neuroimaging by American Society of Neuroimaging and [employer]specialities   Assessment/Plan 1. Hyperlipidemia LDL goal <100 LDL at goal  -Continue on rosuvastatin -Continue with dietary modification and exercise - Lipid panel; Future  2. Paroxysmal A-fib (HCC) Heart rate controlled -Continue on apixaban - TSH; Future - COMPLETE METABOLIC PANEL WITH GFR; Future - CBC with Differential/Platelet; Future  3. Memory loss Reports worsening memory - Ambulatory referral to Neuropsychology  4. 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 Alcohol dependence, in remission (HCC) No recent use of alcohol   6. Skin lesion of lower extremity Will refer to dermatologist to evaluate bilateral lower extremities < 6 mm brownish scattered spots different size suspected Old age spots.    Family/ staff Communication: Reviewed plan of care with patient verbalized understanding   Labs/tests ordered:  - TSH; Future - COMPLETE METABOLIC PANEL WITH GFR; Future - CBC with Differential/Platelet; Future - Lipid panel; Future  Next Appointment : Return in about 6 months (around 06/25/2023) for medical mangement of chronic issues., Fasting labs in 6 months prior to visit.   Caesar Bookman, NP

## 2023-01-20 ENCOUNTER — Other Ambulatory Visit (HOSPITAL_COMMUNITY): Payer: Self-pay

## 2023-01-21 ENCOUNTER — Other Ambulatory Visit (HOSPITAL_COMMUNITY): Payer: Self-pay

## 2023-02-06 DIAGNOSIS — D2372 Other benign neoplasm of skin of left lower limb, including hip: Secondary | ICD-10-CM | POA: Diagnosis not present

## 2023-02-06 DIAGNOSIS — L309 Dermatitis, unspecified: Secondary | ICD-10-CM | POA: Diagnosis not present

## 2023-02-06 DIAGNOSIS — D485 Neoplasm of uncertain behavior of skin: Secondary | ICD-10-CM | POA: Diagnosis not present

## 2023-02-06 DIAGNOSIS — L308 Other specified dermatitis: Secondary | ICD-10-CM | POA: Diagnosis not present

## 2023-02-06 DIAGNOSIS — L958 Other vasculitis limited to the skin: Secondary | ICD-10-CM | POA: Diagnosis not present

## 2023-02-13 ENCOUNTER — Telehealth: Payer: Self-pay

## 2023-02-13 NOTE — Telephone Encounter (Signed)
Noted  

## 2023-02-13 NOTE — Telephone Encounter (Signed)
Patient states no recently new medications. Rash not getting better. She has scheduled appointment for Tuesday 02/17/23

## 2023-02-13 NOTE — Telephone Encounter (Signed)
Patient called stating that she saw dermatologist (Dr. Margo Aye) and was told that the rash on her leg seemed to be more from a reaction to a medication rather than vasculitis. She would like to know what her next steps should be. Please advise.  Message sent to Richarda Blade, NP

## 2023-02-13 NOTE — Telephone Encounter (Signed)
Any recent new medication or supplements? Follow up if rash is getting worse.

## 2023-02-14 ENCOUNTER — Other Ambulatory Visit (HOSPITAL_COMMUNITY): Payer: Self-pay

## 2023-02-17 ENCOUNTER — Other Ambulatory Visit (HOSPITAL_COMMUNITY): Payer: Self-pay

## 2023-02-17 ENCOUNTER — Encounter: Payer: Self-pay | Admitting: Family

## 2023-02-17 ENCOUNTER — Ambulatory Visit (INDEPENDENT_AMBULATORY_CARE_PROVIDER_SITE_OTHER): Payer: PPO | Admitting: Family

## 2023-02-17 VITALS — BP 110/80 | HR 66 | Temp 97.7°F | Resp 18 | Ht 67.0 in | Wt 132.0 lb

## 2023-02-17 DIAGNOSIS — E785 Hyperlipidemia, unspecified: Secondary | ICD-10-CM

## 2023-02-17 DIAGNOSIS — I480 Paroxysmal atrial fibrillation: Secondary | ICD-10-CM | POA: Diagnosis not present

## 2023-02-17 DIAGNOSIS — R21 Rash and other nonspecific skin eruption: Secondary | ICD-10-CM | POA: Diagnosis not present

## 2023-02-17 MED ORDER — HYDROCORTISONE 0.5 % EX OINT
1.0000 | TOPICAL_OINTMENT | Freq: Two times a day (BID) | CUTANEOUS | 0 refills | Status: DC
Start: 2023-02-17 — End: 2023-04-21
  Filled 2023-02-17: qty 30, 15d supply, fill #0

## 2023-02-17 NOTE — Patient Instructions (Signed)
-   Apply Hydrocortisone to affected red areas on the left leg twice daily for 7 days.Notify provider if rash not resolved or worsen

## 2023-02-17 NOTE — Progress Notes (Signed)
Provider: Richarda Blade FNP-C  Edenilson Austad, [name], NP  Patient Care Team: Brianni Manthe, [name], NP as PCP - General (Family Medicine) Jens Som Madolyn Frieze, MD as PCP - Cardiology (Cardiology) Regan Lemming, MD as PCP - Electrophysiology (Cardiology) Richarda Overlie, MD as Consulting Physician (Obstetrics and Gynecology) Jens Som Madolyn Frieze, MD as Consulting Physician (Cardiology) Rodrigo Ran, [place] (Ophthalmology)  Extended Emergency Contact Information Primary Emergency Contact: Patras,Tom Address: [phone] [facility] [address]          [place] [address] [place] Phone: [phone] Relation: Spouse Secondary Emergency Contact: Rodrigo Ran Address: [address]          [place], [place] [address] [place] Mobile Phone: [phone] Relation: Daughter  Code Status: Full Code  Goals of care: Advanced Directive information    10/29/2022   10:20 AM  Advanced Directives  Does Patient Have a Medical Advance Directive? Yes  Type of Estate agent of [place];Living will;Out of facility DNR (pink MOST or yellow form)  Does patient want to make changes to medical advance directive? No - Patient declined     Chief Complaint  Patient presents with   Follow-up    Patient is here for F/U of LE    HPI:  Pt is a 82 y.o. female seen today for an acute visit for evaluation of rash x 1 week.states had a biopsy done x 2 spots on the left leg by dermatologist Dr.John Margo Aye few days after she developed a red rash which seems to be moving towards lower shin area.  She was seen on 02/06/2023 by dermatologist but rash due to vasculitis but was ruled out.  CBC with differential, urine analysis and CMP were done.  No results for review but according to the patient labs were normal except slightly elevated liver enzymes. Biopsy results consistent with Dermatofibroadenoma. States stopped fish oil on Friday.  Also thought rash was caused by her  current medication rosuvastatin and Eliquis since doses were increased in the past.  Discussed with patient that rosuvastatin was decreased in November 2023 and Eliquis was suggested by cardiologist Dec 19, 2022. Rash also appears only on the left leg and not anywhere else in the body which is suspect possible due to use of Neosporin to apply on the biopsy site or other contact cleanser. Also would like NSAID's and Prednisone remove from allergy list states did not really have a reaction.she was recently prescribed Prednisone 5 mg tablet by dermatologist tolerated well.   Past Medical History:  Diagnosis Date   Anxiety    Arthritis    "left knee; left hip; lower back; mild to moderate in right hip" (09/30/2016)   Atrial fibrillation (HCC)    Chronic lower back pain    Gastritis    Heart palpitations    History of bone density study 2018   History of mammogram 2021   History of MRI 2018   History of Papanicolaou smear of cervix    Hypercholesteremia    Osteopenia    Seasonal allergies    Past Surgical History:  Procedure Laterality Date   ATRIAL FIBRILLATION ABLATION N/A 06/10/2022   Procedure: ATRIAL FIBRILLATION ABLATION;  Surgeon: Regan Lemming, MD;  Location: [facility] INVASIVE CV LAB;  Service: Cardiovascular;  Laterality: N/A;   CARDIOVERSION N/A 11/17/2019   Procedure: CARDIOVERSION;  Surgeon: Lewayne Bunting, MD;  [facility] ENDOSCOPY;  Service: Cardiovascular;  Laterality: N/A;   CARDIOVERSION N/A 04/16/2021   Procedure: CARDIOVERSION;  Surgeon:  Chilton Si, MD;  Location: [facility] ENDOSCOPY;  Service: Cardiovascular;  Laterality: N/A;   CARDIOVERSION N/A 08/15/2021   Procedure: CARDIOVERSION;  Surgeon: Maisie Fus, MD;  Location: [facility] ENDOSCOPY;  Service: Cardiovascular;  Laterality: N/A;   COLONOSCOPY     DILATION AND CURETTAGE OF UTERUS     JOINT REPLACEMENT     TONSILLECTOMY     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   Procedure: TOTAL HIP ARTHROPLASTY ANTERIOR APPROACH;   Surgeon: Jodi Geralds, MD;  Location: [facility] OR;  Service: Orthopedics;  Laterality: Left;    Allergies  Allergen Reactions   Nsaids Other (See Comments)    Caused painful stomach problems   Prednisone Other (See Comments)    Caused painful stomach problems    Outpatient Encounter Medications as of 02/17/2023  Medication Sig   acetaminophen (TYLENOL) 325 MG tablet Take 325 mg by mouth every 6 (six) hours as needed for moderate pain or mild pain.   amoxicillin (AMOXIL) 500 MG capsule Take 4 capsules by mouth 1 hour prior to appointment.   apixaban (ELIQUIS) 5 MG TABS tablet Take 1 tablet (5 mg total) by mouth 2 (two) times daily.   Brimonidine Tartrate (LUMIFY) 0.025 % SOLN Place 1 drop into both eyes 2 (two) times daily. Red eyes   Carboxymeth-Glyc-Polysorb PF (REFRESH OPTIVE MEGA-3) 0.5-1-0.5 % SOLN Place 1 drop into both eyes 4 (four) times daily as needed (dry eyes). Enhance with flax seed oil   diphenhydramine-acetaminophen (TYLENOL PM) 25-500 MG TABS tablet Take 1 tablet by mouth at bedtime as needed (Pain/sleep).   ketotifen (ZADITOR) 0.025 % ophthalmic solution Place 1 drop into both eyes 3 (three) times a week. In the fall   loratadine (CLARITIN) 10 MG tablet Take 10 mg by mouth daily as needed for allergies.   melatonin 3 MG TABS tablet Take 3 mg by mouth at bedtime. 1/2 tablet   NON FORMULARY Take 1 tablet by mouth daily. Plant calcium(1000 mg)   Omega-3 Fatty Acids (FISH OIL PO) Take 3 capsules by mouth daily. 2400 mg  1680 EPA TG DHA 550 RTG   OVER THE COUNTER MEDICATION Place 1 application  into both eyes daily. Pure & Clean   rosuvastatin (CRESTOR) 10 MG tablet Take 1 tablet (10 mg total) by mouth daily.   Simethicone (GAS-X PO) Take 1 capsule by mouth daily as needed (gas).   No facility-administered encounter medications on file as of 02/17/2023.    Review of Systems  Constitutional:  Negative for appetite change, chills, fatigue, fever and unexpected weight change.   HENT:  Negative for congestion, dental problem, ear discharge, ear pain, facial swelling, hearing loss, nosebleeds, postnasal drip, rhinorrhea, sinus pressure, sinus pain, sneezing and sore throat.   Eyes:  Negative for pain, discharge, redness, itching and visual disturbance.  Respiratory:  Negative for cough, chest tightness, shortness of breath and wheezing.   Cardiovascular:  Negative for chest pain, palpitations and leg swelling.  Gastrointestinal:  Negative for abdominal distention, abdominal pain, blood in stool, constipation, diarrhea, nausea and vomiting.  Genitourinary:  Positive for frequency. Negative for difficulty urinating, dysuria, flank pain and urgency.  Musculoskeletal:  Negative for arthralgias, back pain, gait problem, joint swelling, myalgias, neck pain and neck stiffness.  Skin:  Positive for rash. Negative for color change and pallor.       Left leg biopsy site x 2   Neurological:  Negative for dizziness, syncope, speech difficulty, weakness, light-headedness, numbness and headaches.  Hematological:  Does not bruise/bleed easily.  Psychiatric/Behavioral:  Negative for agitation, behavioral problems, confusion, hallucinations, self-injury, sleep disturbance and suicidal ideas. The patient is not nervous/anxious.     Immunization History  Administered Date(s) Administered   COVID-19, mRNA, vaccine(Comirnaty)12 years and older 07/31/2022   Fluad Quad(high Dose 65+) 06/03/2022   Influenza Split 04/30/2015, 04/20/2016, 04/27/2018, 06/16/2019   Influenza, High Dose Seasonal PF 06/28/2017, 06/16/2019, 06/02/2020   Influenza-Unspecified 06/19/2021   PFIZER(Purple Top)SARS-COV-2 Vaccination 08/12/2019, 09/02/2019, 05/01/2020, 11/13/2020, 04/12/2021   Pneumococcal Conjugate-13 09/30/2013   Pneumococcal Polysaccharide-23 07/27/2003, 09/24/2010   Td 04/26/2001, 12/25/2017   Tdap 09/24/2010   Zoster Recombinant(Shingrix) 11/16/2017, 01/06/2018   Zoster, Live 12/11/2006,  04/27/2018, 06/29/2018   Pertinent  Health Maintenance Due  Topic Date Due   INFLUENZA VACCINE  02/26/2023   DEXA SCAN  Completed      06/17/2022    3:12 PM 08/11/2022    2:30 PM 10/27/2022   11:03 AM 10/29/2022   10:19 AM 12/23/2022    3:02 PM  Fall Risk  Falls in the past year? 0 0 1 1 1   Was there an injury with Fall?  0 1 1 0  Fall Risk Category Calculator  0 3 3 2   Patient at Risk for Falls Due to  No Fall Risks Other (Comment) History of fall(s) History of fall(s)  Patient at Risk for Falls Due to - Comments   Patient states that she trip over a doggie gate    Fall risk Follow up  Falls evaluation completed Falls evaluation completed Falls evaluation completed Falls evaluation completed   Functional Status Survey:    Vitals:   02/17/23 0841  BP: 110/80  Pulse: 66  Resp: 18  Temp: 97.7 F (36.5 C)  SpO2: 99%  Weight: 132 lb (59.9 kg)  Height: 5\' 7"  (1.702 m)   Body mass index is 20.67 kg/m. Physical Exam Vitals reviewed.  Constitutional:      General: She is not in acute distress.    Appearance: Normal appearance. She is normal weight. She is not ill-appearing or diaphoretic.  HENT:     Head: Normocephalic.     Nose: Nose normal. No congestion or rhinorrhea.     Mouth/Throat:     Mouth: Mucous membranes are moist.     Pharynx: Oropharynx is clear. No oropharyngeal exudate or posterior oropharyngeal erythema.  Eyes:     General: No scleral icterus.       Right eye: No discharge.        Left eye: No discharge.     Conjunctiva/sclera: Conjunctivae normal.     Pupils: Pupils are equal, round, and reactive to light.  Cardiovascular:     Rate and Rhythm: Normal rate and regular rhythm.     Pulses: Normal pulses.     Heart sounds: Normal heart sounds. No murmur heard.    No friction rub. No gallop.  Pulmonary:     Effort: Pulmonary effort is normal. No respiratory distress.     Breath sounds: Normal breath sounds. No wheezing, rhonchi or rales.  Chest:      Chest wall: No tenderness.  Abdominal:     General: Bowel sounds are normal. There is no distension.     Palpations: Abdomen is soft. There is no mass.     Tenderness: There is no abdominal tenderness. There is no right CVA tenderness, left CVA tenderness, guarding or rebound.  Musculoskeletal:        General: No swelling or tenderness. Normal range of motion.  Right lower leg: No edema.     Left lower leg: No edema.  Skin:    General: Skin is warm and dry.     Coloration: Skin is not pale.     Findings: Rash present. No bruising or lesion.     Comments: Punched out x 2 areas on left medial upper leg wound bed without any drainage.Surrounding skin with diffuse red extending to shin area non-blanchable rash non-tender to touch   Neurological:     Mental Status: She is alert and oriented to person, place, and time.     Cranial Nerves: No cranial nerve deficit.     Sensory: No sensory deficit.     Motor: No weakness.     Coordination: Coordination normal.     Gait: Gait normal.  Psychiatric:        Mood and Affect: Mood normal.        Speech: Speech normal.        Behavior: Behavior normal.        Thought Content: Thought content normal.        Judgment: Judgment normal.    Labs reviewed: Recent Labs    05/20/22 0904 06/03/22 0925 12/18/22 0842  NA 145* 141 142  K 4.6 4.2 4.1  CL 106 104 107  CO2 26 30 29   GLUCOSE 84 75 77  BUN 22 25 26*  CREATININE 0.92 0.82 0.75  CALCIUM 9.3 9.4 9.1   Recent Labs    03/11/22 1002 06/03/22 0925 07/16/22 0804 12/18/22 0842  AST 35 48* 37* 33  33  ALT 34 42* 35* 19  19  ALKPHOS 54  --   --   --   BILITOT 0.2* 1.0 0.7 0.9  0.9  PROT 5.1* 6.1 5.9* 6.1  6.1  ALBUMIN 3.9  --   --   --    Recent Labs    03/11/22 1002 05/20/22 0904 06/03/22 0925 12/18/22 0842  WBC 3.7* 3.3* 3.4* 3.5*  NEUTROABS 1.6*  --  1,336* 1,411*  HGB 15.1* 14.3 14.7 14.4  HCT 42.9 42.9 43.5 43.7  MCV 96.4 98* 98.4 98.6  PLT 134* 140* 136* 144    Lab Results  Component Value Date   TSH 3.36 12/18/2022   No results found for: "HGBA1C" Lab Results  Component Value Date   CHOL 144 12/18/2022   HDL 64 12/18/2022   LDLCALC 66 12/18/2022   TRIG 59 12/18/2022   CHOLHDL 2.3 12/18/2022    Significant Diagnostic Results in last 30 days:  No results found.  Assessment/Plan  1. Rash and nonspecific skin eruption Afebrile  Punched out x 2 areas on left medial upper leg wound bed without any drainage.Surrounding skin with diffuse red extending to shin area non-blanchable rash non-tender to touch  D/w patient unlikely from her Crestor or Eliquis since she has been on these medication for several months.suspect possible use of neosporin or recent topical cleanser during biopsy.will have use hydrocortisone and complete prednisone as prescribed by dermatologist. - will consult with dermatologist Dr.John Margo Aye at [phone] fax [phone]  - advised to take pictures and upload on Mychart if symptoms worsen  - Has lab work scheduled with Oncologist  - continue to monitor  - hydrocortisone ointment 0.5 %; Apply 1 Application topically 2 (two) times daily.  Dispense: 30 g; Refill: 0 - Hepatic function panel  2. Paroxysmal A-fib (HCC) HR regular  - continue on Eliquis  - CBC with Differential/Platelet - COMPLETE METABOLIC PANEL WITH  GFR - TSH  3. Hyperlipidemia LDL goal <100 LDL at goal  - continue on rosuvastatin 10 mg tablet daily  - dietary modification and exercise advise d - Lipid panel    Family/ staff Communication: Reviewed plan of care with patient verbalized understanding  Labs/tests ordered: Has upcoming appointment with oncologist will recheck lab work.  Will cancel for 1 month lab work for hepatic panel   Next Appointment: Return in about 1 month (around 03/20/2023) for Lab to recheck Hepatic panel .   Caesar Bookman, NP

## 2023-02-19 ENCOUNTER — Encounter: Payer: Self-pay | Admitting: Dermatology

## 2023-02-23 ENCOUNTER — Telehealth: Payer: Self-pay

## 2023-02-23 NOTE — Telephone Encounter (Signed)
Please take picture of rash and upload to Mychart.Still on Prednisone ?

## 2023-02-23 NOTE — Telephone Encounter (Signed)
Patient called stating that she was told to call if the rash on her leg wasn't getting better. She states that it is not getting better and would like to know what she should do.  Message sent to Richarda Blade, NP

## 2023-02-24 NOTE — Telephone Encounter (Signed)
Mychart message sent to patient.

## 2023-02-26 DIAGNOSIS — D320 Benign neoplasm of cerebral meninges: Secondary | ICD-10-CM | POA: Diagnosis not present

## 2023-03-04 DIAGNOSIS — M21611 Bunion of right foot: Secondary | ICD-10-CM | POA: Diagnosis not present

## 2023-03-04 DIAGNOSIS — M2041 Other hammer toe(s) (acquired), right foot: Secondary | ICD-10-CM | POA: Diagnosis not present

## 2023-03-04 DIAGNOSIS — M21612 Bunion of left foot: Secondary | ICD-10-CM | POA: Diagnosis not present

## 2023-03-04 DIAGNOSIS — L565 Disseminated superficial actinic porokeratosis (DSAP): Secondary | ICD-10-CM | POA: Diagnosis not present

## 2023-03-04 DIAGNOSIS — D2371 Other benign neoplasm of skin of right lower limb, including hip: Secondary | ICD-10-CM | POA: Diagnosis not present

## 2023-03-04 DIAGNOSIS — S9031XA Contusion of right foot, initial encounter: Secondary | ICD-10-CM | POA: Diagnosis not present

## 2023-03-12 ENCOUNTER — Inpatient Hospital Stay (HOSPITAL_COMMUNITY): Admission: RE | Admit: 2023-03-12 | Payer: PPO | Source: Ambulatory Visit | Admitting: Physician Assistant

## 2023-03-12 ENCOUNTER — Encounter (HOSPITAL_COMMUNITY): Payer: Self-pay

## 2023-03-16 DIAGNOSIS — Z1283 Encounter for screening for malignant neoplasm of skin: Secondary | ICD-10-CM | POA: Diagnosis not present

## 2023-03-16 DIAGNOSIS — D225 Melanocytic nevi of trunk: Secondary | ICD-10-CM | POA: Diagnosis not present

## 2023-03-16 DIAGNOSIS — I872 Venous insufficiency (chronic) (peripheral): Secondary | ICD-10-CM | POA: Diagnosis not present

## 2023-03-19 ENCOUNTER — Inpatient Hospital Stay: Payer: PPO | Attending: Hematology and Oncology

## 2023-03-19 ENCOUNTER — Other Ambulatory Visit (HOSPITAL_COMMUNITY): Payer: Self-pay

## 2023-03-19 ENCOUNTER — Inpatient Hospital Stay: Payer: PPO | Admitting: Hematology and Oncology

## 2023-03-19 ENCOUNTER — Encounter: Payer: Self-pay | Admitting: Hematology and Oncology

## 2023-03-19 VITALS — BP 98/57 | HR 81 | Temp 97.4°F | Resp 18 | Ht 67.0 in | Wt 138.4 lb

## 2023-03-19 DIAGNOSIS — Z87891 Personal history of nicotine dependence: Secondary | ICD-10-CM | POA: Insufficient documentation

## 2023-03-19 DIAGNOSIS — Z7901 Long term (current) use of anticoagulants: Secondary | ICD-10-CM | POA: Diagnosis not present

## 2023-03-19 DIAGNOSIS — D72819 Decreased white blood cell count, unspecified: Secondary | ICD-10-CM

## 2023-03-19 DIAGNOSIS — D696 Thrombocytopenia, unspecified: Secondary | ICD-10-CM

## 2023-03-19 LAB — CMP (CANCER CENTER ONLY)
ALT: 28 U/L (ref 0–44)
AST: 37 U/L (ref 15–41)
Albumin: 4 g/dL (ref 3.5–5.0)
Alkaline Phosphatase: 58 U/L (ref 38–126)
Anion gap: 7 (ref 5–15)
BUN: 22 mg/dL (ref 8–23)
CO2: 26 mmol/L (ref 22–32)
Calcium: 9 mg/dL (ref 8.9–10.3)
Chloride: 107 mmol/L (ref 98–111)
Creatinine: 0.78 mg/dL (ref 0.44–1.00)
GFR, Estimated: >60 mL/min (ref >60)
Glucose, Bld: 89 mg/dL (ref 70–99)
Potassium: 3.9 mmol/L (ref 3.5–5.1)
Sodium: 140 mmol/L (ref 135–145)
Total Bilirubin: 1.1 mg/dL (ref 0.3–1.2)
Total Protein: 6.4 g/dL — ABNORMAL LOW (ref 6.5–8.1)

## 2023-03-19 LAB — CBC WITH DIFFERENTIAL (CANCER CENTER ONLY)
Abs Immature Granulocytes: 0.01 10*3/uL (ref 0.00–0.07)
Basophils Absolute: 0 10*3/uL (ref 0.0–0.1)
Basophils Relative: 1 %
Eosinophils Absolute: 0.1 10*3/uL (ref 0.0–0.5)
Eosinophils Relative: 3 %
HCT: 43.9 % (ref 36.0–46.0)
Hemoglobin: 14.8 g/dL (ref 12.0–15.0)
Immature Granulocytes: 0 %
Lymphocytes Relative: 37 %
Lymphs Abs: 1.4 10*3/uL (ref 0.7–4.0)
MCH: 33.3 pg (ref 26.0–34.0)
MCHC: 33.7 g/dL (ref 30.0–36.0)
MCV: 98.7 fL (ref 80.0–100.0)
Monocytes Absolute: 0.5 10*3/uL (ref 0.1–1.0)
Monocytes Relative: 14 %
Neutro Abs: 1.7 10*3/uL (ref 1.7–7.7)
Neutrophils Relative %: 45 %
Platelet Count: 169 10*3/uL (ref 150–400)
RBC: 4.45 MIL/uL (ref 3.87–5.11)
RDW: 11.9 % (ref 11.5–15.5)
WBC Count: 3.8 10*3/uL — ABNORMAL LOW (ref 4.0–10.5)
nRBC: 0 % (ref 0.0–0.2)

## 2023-03-19 MED ORDER — AMOXICILLIN 500 MG PO CAPS
2000.0000 mg | ORAL_CAPSULE | ORAL | 0 refills | Status: DC
  Filled 2023-03-19: qty 4, 1d supply, fill #0

## 2023-03-19 NOTE — Progress Notes (Signed)
[name] OFFICE PROGRESS NOTE  Shelia [name], Shelia Citrin, NP  ASSESSMENT & PLAN:  Shelia (HCC) This has resolved.  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   The total time spent in the appointment was 20 minutes encounter with patients including review of chart and various tests results, discussions about plan of care and coordination of care plan   All questions were answered. The patient knows to call the clinic with any problems, questions or concerns. No barriers to learning was detected.    Shelia Delay, MD 8/22/202410:59 AM  INTERVAL HISTORY: Shelia [name] 82 y.o. female returns for follow-up and evaluation for intermittent Shelia [name] and Shelia Since last time I saw her, she is doing well She has some skin lesions on the leg that was biopsied by the dermatologist and came back benign She denies recent infection or bleeding Reviewed her CBC results and discussed future follow-up  SUMMARY OF HEMATOLOGIC HISTORY:  Shelia [name] is here because of recent abnormal findings of CBC I have the opportunity to review her CBC dated back to 2018 On 09/22/2016, her white blood cell count was 3.6, hemoglobin 13.5 and platelet count of 198 Most recently, the patient was admitted for cardioversion and was noted to have Shelia [name] and Shelia She was also noted to have grossly abnormal LFTs Overall, over the last 5 years, her white blood cell count ranged from 2.9-7.3 and platelet count ranged from 132-1 98 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  Her last antibiotic therapy was more than 6 months The patient denies alcohol intake since end of 2022  I have reviewed the past medical history, past surgical history, social history and family history with the patient and they are unchanged from previous note.  ALLERGIES:  has No Known Allergies.  MEDICATIONS:  Current Outpatient Medications  Medication Sig Dispense Refill   acetaminophen (TYLENOL) 325 MG tablet Take 325 mg by mouth every 6 (six) hours as needed for moderate pain or mild pain.     amoxicillin (AMOXIL) 500 MG capsule Take 4 capsules by mouth 1 hour prior to appointment. 4 capsule 0   amoxicillin (AMOXIL) 500 MG capsule Take 4 capsules (2,000 mg total) by mouth 1 hour prior to appointment. 4 capsule 0   apixaban (ELIQUIS) 5 MG TABS tablet Take 1 tablet (5 mg total) by mouth 2 (two) times daily. 60 tablet 5   Brimonidine Tartrate (LUMIFY) 0.025 % SOLN Place 1 drop into both eyes 2 (two) times daily. Red eyes     Carboxymeth-Glyc-Polysorb PF (REFRESH OPTIVE MEGA-3) 0.5-1-0.5 % SOLN Place 1 drop into both eyes 4 (four) times daily as needed (dry eyes). Enhance with flax seed oil     diphenhydramine-acetaminophen (TYLENOL PM) 25-500 MG TABS tablet Take 1 tablet by mouth at bedtime as needed (Pain/sleep).  hydrocortisone ointment 0.5 % Apply 1 Application topically 2 (two) times daily. 30 g 0   ketotifen (ZADITOR) 0.025 % ophthalmic solution Place 1 drop into both eyes 3 (three) times a week. In the fall     loratadine (CLARITIN) 10 MG tablet Take 10 mg by mouth daily as needed for allergies.     melatonin 3 MG TABS tablet Take 3 mg by mouth at bedtime. 1/2 tablet     Multiple Vitamin (MULTIVITAMIN) tablet Take 1 tablet by mouth daily.      NON FORMULARY Take 1 tablet by mouth daily. Plant calcium(1000 mg)     Omega-3 Fatty Acids (FISH OIL PO) Take 3 capsules by mouth daily. 2400 mg  1680 EPA TG DHA 550 RTG (Patient not taking: Reported on 02/17/2023)     OVER THE COUNTER MEDICATION Place 1 application  into both eyes daily. Pure & Clean     rosuvastatin (CRESTOR) 10 MG tablet Take 1 tablet (10 mg total) by mouth daily. 90 tablet 3   Simethicone (GAS-X PO) Take 1 capsule by mouth daily as needed (gas).     No current facility-administered medications for this visit.     REVIEW OF SYSTEMS:   Constitutional: Denies fevers, chills or night sweats Eyes: Denies blurriness of vision Ears, nose, mouth, throat, and face: Denies mucositis or sore throat Respiratory: Denies cough, dyspnea or wheezes Cardiovascular: Denies palpitation, chest discomfort or lower extremity swelling Gastrointestinal:  Denies nausea, heartburn or change in bowel habits Skin: Denies abnormal skin rashes Lymphatics: Denies new lymphadenopathy or easy bruising Neurological:Denies numbness, tingling or new weaknesses Behavioral/Psych: Mood is stable, no new changes  All other systems were reviewed with the patient and are negative.  PHYSICAL EXAMINATION: ECOG PERFORMANCE STATUS: 0 - Asymptomatic  Vitals:   03/19/23 1020  BP: (!) 98/57  Pulse: 81  Resp: 18  Temp: (!) 97.4 F (36.3 C)  SpO2: 97%   Filed Weights   03/19/23 1020  Weight: 138 lb 6.4 oz (62.8 kg)    GENERAL:alert, no distress and comfortable SKIN: She has scabs on her left leg NEURO: alert & oriented x 3 with fluent speech, no focal motor/sensory deficits  LABORATORY DATA:  I have reviewed the data as listed     Component Value Date/Time   NA 140 03/19/2023 1004   NA 145 (H) 05/20/2022 0904   K 3.9 03/19/2023 1004   CL 107 03/19/2023 1004   CO2 26 03/19/2023 1004   GLUCOSE 89 03/19/2023 1004   BUN 22 03/19/2023 1004   BUN 22 05/20/2022 0904   CREATININE 0.78 03/19/2023  1004   CREATININE 0.75 12/18/2022 0842   CALCIUM 9.0 03/19/2023 1004   PROT 6.4 (L) 03/19/2023 1004   PROT 5.8 (L) 07/25/2021 0907   ALBUMIN 4.0 03/19/2023 1004   ALBUMIN 4.5 07/25/2021 0907   AST 37 03/19/2023 1004   ALT 28 03/19/2023 1004   ALKPHOS 58 03/19/2023 1004   BILITOT 1.1 03/19/2023 1004   GFRNONAA >60 03/19/2023 1004   GFRNONAA 79 12/03/2020 1153   GFRAA 91 12/03/2020 1153    No results found for: "SPEP", "UPEP"  Lab Results  Component Value Date   WBC 3.8 (L) 03/19/2023   NEUTROABS 1.7 03/19/2023   HGB 14.8 03/19/2023   HCT 43.9 03/19/2023   MCV 98.7 03/19/2023   PLT 169 03/19/2023      Chemistry      Component Value Date/Time   NA 140 03/19/2023 1004   NA 145 (H)  05/20/2022 0904   K 3.9 03/19/2023 1004   CL 107 03/19/2023 1004   CO2 26 03/19/2023 1004   BUN 22 03/19/2023 1004   BUN 22 05/20/2022 0904   CREATININE 0.78 03/19/2023 1004   CREATININE 0.75 12/18/2022 0842      Component Value Date/Time   CALCIUM 9.0 03/19/2023 1004   ALKPHOS 58 03/19/2023 1004   AST 37 03/19/2023 1004   ALT 28 03/19/2023 1004   BILITOT 1.1 03/19/2023 1004

## 2023-03-19 NOTE — Assessment & Plan Note (Signed)
She has intermittent leukopenia for the past 5 years, cause unknown but could be related to past history of alcohol use and trace mineral deficiencies She is not symptomatic Observe only  

## 2023-03-19 NOTE — Assessment & Plan Note (Signed)
This has resolved.  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

## 2023-03-20 ENCOUNTER — Other Ambulatory Visit (HOSPITAL_COMMUNITY): Payer: Self-pay

## 2023-03-21 ENCOUNTER — Other Ambulatory Visit (HOSPITAL_COMMUNITY): Payer: Self-pay

## 2023-03-25 ENCOUNTER — Ambulatory Visit (HOSPITAL_COMMUNITY)
Admission: RE | Admit: 2023-03-25 | Discharge: 2023-03-25 | Disposition: A | Discharge status: Home / Self Care | Payer: PPO | Source: Ambulatory Visit | Attending: Physician Assistant | Admitting: Physician Assistant

## 2023-03-25 ENCOUNTER — Encounter (HOSPITAL_COMMUNITY): Payer: Self-pay | Admitting: Physician Assistant

## 2023-03-25 VITALS — BP 90/64 | HR 87 | Ht 67.0 in | Wt 136.4 lb

## 2023-03-25 DIAGNOSIS — I2510 Atherosclerotic heart disease of native coronary artery without angina pectoris: Secondary | ICD-10-CM | POA: Diagnosis not present

## 2023-03-25 DIAGNOSIS — Z7901 Long term (current) use of anticoagulants: Secondary | ICD-10-CM | POA: Insufficient documentation

## 2023-03-25 DIAGNOSIS — R9431 Abnormal electrocardiogram [ECG] [EKG]: Secondary | ICD-10-CM | POA: Insufficient documentation

## 2023-03-25 DIAGNOSIS — D6869 Other thrombophilia: Secondary | ICD-10-CM | POA: Insufficient documentation

## 2023-03-25 DIAGNOSIS — I4819 Other persistent atrial fibrillation: Secondary | ICD-10-CM

## 2023-03-25 DIAGNOSIS — I4891 Unspecified atrial fibrillation: Secondary | ICD-10-CM | POA: Diagnosis present

## 2023-03-25 NOTE — H&P (View-Only) (Signed)
Primary Care Physician: Caesar Bookman, NP Primary Cardiologist: Dr Jens Som Primary Electrophysiologist: Dr Elberta Fortis Referring Physician: Ulyses Southward triage    Shelia [name] is a 82 y.o. female with a history of HLD, CAD, atrial fibrillation who presents for follow up in the [facility].  The patient was initially diagnosed with atrial fibrillation early 2021 and underwent DCCV on 11/17/19. She had recurrent symptoms of persistent afib and had repeat DCCV on 04/16/21. Patient is on Eliquis for a CHADS2VASC score of 4. She presented to urgent care 07/24/21 with palpitations and mild SOB. ECG showed rate controlled afib. There were no specific triggers that she could identify. Patient is s/p DCCV on 08/15/21.   Patient is s/p afib ablation with Dr Elberta Fortis on 06/10/22.   On follow up today, patient reports that about one week ago she noticed more fatigue and SOB with exertion. She has also had some intermittent dizziness. She checked her Lourena Simmonds mobile two days ago which showed afib. There were no specific triggers that she could identify. She did miss a dose of Eliquis but is sure she has not missed any since 8/27.  Today, she denies symptoms of palpitations, chest pain, orthopnea, PND, lower extremity edema, presyncope, syncope, snoring, daytime somnolence, bleeding, or neurologic sequela. The patient is tolerating medications without difficulties and is otherwise without complaint today.    Atrial Fibrillation Risk Factors:  she does not have symptoms or diagnosis of sleep apnea. she does not have a history of rheumatic fever. she does not have a history of alcohol use.   Atrial Fibrillation Management history:  Previous antiarrhythmic drugs: none Previous cardioversions: 11/17/19, 04/16/21 Previous ablations: 06/10/22 Anticoagulation history: Eliquis   Past Medical History:  Diagnosis Date   Anxiety    Arthritis    "left knee; left hip; lower back;  mild to moderate in right hip" (09/30/2016)   Atrial fibrillation (HCC)    Chronic lower back pain    Gastritis    Heart palpitations    History of bone density study 2018   History of mammogram 2021   History of MRI 2018   History of Papanicolaou smear of cervix    Hypercholesteremia    Osteopenia    Seasonal allergies     Current Outpatient Medications  Medication Sig Dispense Refill   acetaminophen (TYLENOL) 325 MG tablet Take 325 mg by mouth every 6 (six) hours as needed for moderate pain or mild pain.     amoxicillin (AMOXIL) 500 MG capsule Take 4 capsules by mouth 1 hour prior to appointment. 4 capsule 0   amoxicillin (AMOXIL) 500 MG capsule Take 4 capsules (2,000 mg total) by mouth 1 hour prior to appointment. 4 capsule 0   apixaban (ELIQUIS) 5 MG TABS tablet Take 1 tablet (5 mg total) by mouth 2 (two) times daily. 60 tablet 5   Brimonidine Tartrate (LUMIFY) 0.025 % SOLN Place 1 drop into both eyes 2 (two) times daily. Red eyes     Carboxymeth-Glyc-Polysorb PF (REFRESH OPTIVE MEGA-3) 0.5-1-0.5 % SOLN Place 1 drop into both eyes 4 (four) times daily as needed (dry eyes). Enhance with flax seed oil     diphenhydramine-acetaminophen (TYLENOL PM) 25-500 MG TABS tablet Take 1 tablet by mouth at bedtime as needed (Pain/sleep).     hydrocortisone ointment 0.5 % Apply 1 Application topically 2 (two) times daily. 30 g 0   ketotifen (ZADITOR) 0.025 % ophthalmic solution Place 1 drop into both eyes 3 (three) times a  week. In the fall     loratadine (CLARITIN) 10 MG tablet Take 10 mg by mouth daily as needed for allergies.     melatonin 3 MG TABS tablet Take 3 mg by mouth at bedtime. 1/2 tablet     Multiple Vitamin (MULTIVITAMIN) tablet Take 1 tablet by mouth daily.     NON FORMULARY Take 1 tablet by mouth daily. Plant calcium(1000 mg)     Omega-3 Fatty Acids (FISH OIL PO) Take 3 capsules by mouth daily. 2400 mg  1680 EPA TG DHA 550 RTG     OVER THE COUNTER MEDICATION Place 1 application   into both eyes daily. Pure & Clean     rosuvastatin (CRESTOR) 10 MG tablet Take 1 tablet (10 mg total) by mouth daily. 90 tablet 3   Simethicone (GAS-X PO) Take 1 capsule by mouth daily as needed (gas).     No current facility-administered medications for this encounter.    ROS- All systems are reviewed and negative except as per the HPI above.  Physical Exam: Vitals:   03/25/23 1516  BP: 90/64  Pulse: 87  Weight: 61.9 kg  Height: 5\' 7"  (1.702 m)    GEN: Well nourished, well developed in no acute distress NECK: No JVD; No carotid bruits CARDIAC: Irregularly irregular rate and rhythm, no murmurs, rubs, gallops RESPIRATORY:  Clear to auscultation without rales, wheezing or rhonchi  ABDOMEN: Soft, non-tender, non-distended EXTREMITIES:  No edema; No deformity    Wt Readings from Last 3 Encounters:  03/25/23 61.9 kg  03/19/23 62.8 kg  02/17/23 59.9 kg    EKG today demonstrates  Afib Vent. rate 87 BPM PR interval * ms QRS duration 76 ms QT/QTcB 352/423 ms  Echo 10/31/19 demonstrated   1. Left ventricular ejection fraction, by estimation, is 60 to 65%. The  left ventricle has normal function. The left ventricle has no regional  wall motion abnormalities. Left ventricular diastolic parameters were  normal. The average left ventricular global longitudinal strain is -22.9 %. The global longitudinal strain is normal.   2. Right ventricular systolic function is normal. The right ventricular  size is normal. There is mildly elevated pulmonary artery systolic  pressure.   3. Left atrial size was moderately dilated.   4. Right atrial size was moderately dilated.   5. The mitral valve is normal in structure. Mild mitral valve  regurgitation. No evidence of mitral stenosis.   6. Tricuspid valve regurgitation is mild to moderate.   7. The aortic valve is tricuspid. Aortic valve regurgitation is mild. No  aortic stenosis is present.   8. Pulmonic valve regurgitation is moderate.    9. The inferior vena cava is dilated in size with <50% respiratory  variability, suggesting right atrial pressure of 15 mmHg.   Comparison(s): No significant change from prior study. 10/31/19 EF 60-65%.   Conclusion(s)/Recommendation(s): Otherwise normal echocardiogram, with  minor abnormalities described in the report.   Epic records are reviewed at length today  CHA2DS2-VASc Score = 3  The patient's score is based upon: CHF History: 0 HTN History: 0 Diabetes History: 0 Stroke History: 0 Vascular Disease History: 0 Age Score: 2 Gender Score: 1       ASSESSMENT AND PLAN: Persistent Atrial Fibrillation (ICD10:  I48.19) The patient's CHA2DS2-VASc score is 3, indicating a 3.2% annual risk of stroke.   S/p afib ablation 06/10/22 Patient back in persistent afib for the past week. We discussed rhythm control options today. Will plan for DCCV. If she has  quick return of her afib, may need to consider AAD or repeat ablation.  Continue Eliquis 5 mg BID  Kardia mobile for home monitoring.  Secondary Hypercoagulable State (ICD10:  D68.69) The patient is at significant risk for stroke/thromboembolism based upon her CHA2DS2-VASc Score of 3.  Continue Apixaban (Eliquis).   CAD Coronary calcium score 192 (63rd percentile) FFR negative for significant stenosis No anginal symptoms   Follow up in the AF clinic post DCCV.    Informed Consent   Shared Decision Making/Informed Consent The risks (stroke, cardiac arrhythmias rarely resulting in the need for a temporary or permanent pacemaker, skin irritation or burns and complications associated with conscious sedation including aspiration, arrhythmia, respiratory failure and death), benefits (restoration of normal sinus rhythm) and alternatives of a direct current cardioversion were explained in detail to Ms. Huizinga and she agrees to proceed.       Jorja Loa PA-C Afib Clinic [facility] [address] [place], [place]  [address] [phone] 03/25/2023 4:03 PM

## 2023-03-25 NOTE — Patient Instructions (Signed)
Cardioversion scheduled for: Tuesday, September 17th   - Arrive at the [facility] and go to admitting at 7am   - Do not eat or drink anything after midnight the night prior to your procedure.   - Take all your morning medication (except diabetic medications) with a sip of water prior to arrival.  - You will not be able to drive home after your procedure.    - Do NOT miss any doses of your blood thinner - if you should miss a dose please notify our office immediately.   - If you feel as if you go back into normal rhythm prior to scheduled cardioversion, please notify our office immediately.   If your procedure is canceled in the cardioversion suite you will be charged a cancellation fee.

## 2023-03-25 NOTE — Progress Notes (Signed)
Primary Care Physician: Caesar Bookman, NP Primary Cardiologist: Dr Jens Som Primary Electrophysiologist: Dr Elberta Fortis Referring Physician: Ulyses Southward triage    Shelia [name] is a 82 y.o. female with a history of HLD, CAD, atrial fibrillation who presents for follow up in the [facility].  The patient was initially diagnosed with atrial fibrillation early 2021 and underwent DCCV on 11/17/19. She had recurrent symptoms of persistent afib and had repeat DCCV on 04/16/21. Patient is on Eliquis for a CHADS2VASC score of 4. She presented to urgent care 07/24/21 with palpitations and mild SOB. ECG showed rate controlled afib. There were no specific triggers that she could identify. Patient is s/p DCCV on 08/15/21.   Patient is s/p afib ablation with Dr Elberta Fortis on 06/10/22.   On follow up today, patient reports that about one week ago she noticed more fatigue and SOB with exertion. She has also had some intermittent dizziness. She checked her Lourena Simmonds mobile two days ago which showed afib. There were no specific triggers that she could identify. She did miss a dose of Eliquis but is sure she has not missed any since 8/27.  Today, she denies symptoms of palpitations, chest pain, orthopnea, PND, lower extremity edema, presyncope, syncope, snoring, daytime somnolence, bleeding, or neurologic sequela. The patient is tolerating medications without difficulties and is otherwise without complaint today.    Atrial Fibrillation Risk Factors:  she does not have symptoms or diagnosis of sleep apnea. she does not have a history of rheumatic fever. she does not have a history of alcohol use.   Atrial Fibrillation Management history:  Previous antiarrhythmic drugs: none Previous cardioversions: 11/17/19, 04/16/21 Previous ablations: 06/10/22 Anticoagulation history: Eliquis   Past Medical History:  Diagnosis Date   Anxiety    Arthritis    "left knee; left hip; lower back;  mild to moderate in right hip" (09/30/2016)   Atrial fibrillation (HCC)    Chronic lower back pain    Gastritis    Heart palpitations    History of bone density study 2018   History of mammogram 2021   History of MRI 2018   History of Papanicolaou smear of cervix    Hypercholesteremia    Osteopenia    Seasonal allergies     Current Outpatient Medications  Medication Sig Dispense Refill   acetaminophen (TYLENOL) 325 MG tablet Take 325 mg by mouth every 6 (six) hours as needed for moderate pain or mild pain.     amoxicillin (AMOXIL) 500 MG capsule Take 4 capsules by mouth 1 hour prior to appointment. 4 capsule 0   amoxicillin (AMOXIL) 500 MG capsule Take 4 capsules (2,000 mg total) by mouth 1 hour prior to appointment. 4 capsule 0   apixaban (ELIQUIS) 5 MG TABS tablet Take 1 tablet (5 mg total) by mouth 2 (two) times daily. 60 tablet 5   Brimonidine Tartrate (LUMIFY) 0.025 % SOLN Place 1 drop into both eyes 2 (two) times daily. Red eyes     Carboxymeth-Glyc-Polysorb PF (REFRESH OPTIVE MEGA-3) 0.5-1-0.5 % SOLN Place 1 drop into both eyes 4 (four) times daily as needed (dry eyes). Enhance with flax seed oil     diphenhydramine-acetaminophen (TYLENOL PM) 25-500 MG TABS tablet Take 1 tablet by mouth at bedtime as needed (Pain/sleep).     hydrocortisone ointment 0.5 % Apply 1 Application topically 2 (two) times daily. 30 g 0   ketotifen (ZADITOR) 0.025 % ophthalmic solution Place 1 drop into both eyes 3 (three) times a  week. In the fall     loratadine (CLARITIN) 10 MG tablet Take 10 mg by mouth daily as needed for allergies.     melatonin 3 MG TABS tablet Take 3 mg by mouth at bedtime. 1/2 tablet     Multiple Vitamin (MULTIVITAMIN) tablet Take 1 tablet by mouth daily.     NON FORMULARY Take 1 tablet by mouth daily. Plant calcium(1000 mg)     Omega-3 Fatty Acids (FISH OIL PO) Take 3 capsules by mouth daily. 2400 mg  1680 EPA TG DHA 550 RTG     OVER THE COUNTER MEDICATION Place 1 application   into both eyes daily. Pure & Clean     rosuvastatin (CRESTOR) 10 MG tablet Take 1 tablet (10 mg total) by mouth daily. 90 tablet 3   Simethicone (GAS-X PO) Take 1 capsule by mouth daily as needed (gas).     No current facility-administered medications for this encounter.    ROS- All systems are reviewed and negative except as per the HPI above.  Physical Exam: Vitals:   03/25/23 1516  BP: 90/64  Pulse: 87  Weight: 61.9 kg  Height: 5\' 7"  (1.702 m)    GEN: Well nourished, well developed in no acute distress NECK: No JVD; No carotid bruits CARDIAC: Irregularly irregular rate and rhythm, no murmurs, rubs, gallops RESPIRATORY:  Clear to auscultation without rales, wheezing or rhonchi  ABDOMEN: Soft, non-tender, non-distended EXTREMITIES:  No edema; No deformity    Wt Readings from Last 3 Encounters:  03/25/23 61.9 kg  03/19/23 62.8 kg  02/17/23 59.9 kg    EKG today demonstrates  Afib Vent. rate 87 BPM PR interval * ms QRS duration 76 ms QT/QTcB 352/423 ms  Echo 10/31/19 demonstrated   1. Left ventricular ejection fraction, by estimation, is 60 to 65%. The  left ventricle has normal function. The left ventricle has no regional  wall motion abnormalities. Left ventricular diastolic parameters were  normal. The average left ventricular global longitudinal strain is -22.9 %. The global longitudinal strain is normal.   2. Right ventricular systolic function is normal. The right ventricular  size is normal. There is mildly elevated pulmonary artery systolic  pressure.   3. Left atrial size was moderately dilated.   4. Right atrial size was moderately dilated.   5. The mitral valve is normal in structure. Mild mitral valve  regurgitation. No evidence of mitral stenosis.   6. Tricuspid valve regurgitation is mild to moderate.   7. The aortic valve is tricuspid. Aortic valve regurgitation is mild. No  aortic stenosis is present.   8. Pulmonic valve regurgitation is moderate.    9. The inferior vena cava is dilated in size with <50% respiratory  variability, suggesting right atrial pressure of 15 mmHg.   Comparison(s): No significant change from prior study. 10/31/19 EF 60-65%.   Conclusion(s)/Recommendation(s): Otherwise normal echocardiogram, with  minor abnormalities described in the report.   Epic records are reviewed at length today  CHA2DS2-VASc Score = 3  The patient's score is based upon: CHF History: 0 HTN History: 0 Diabetes History: 0 Stroke History: 0 Vascular Disease History: 0 Age Score: 2 Gender Score: 1       ASSESSMENT AND PLAN: Persistent Atrial Fibrillation (ICD10:  I48.19) The patient's CHA2DS2-VASc score is 3, indicating a 3.2% annual risk of stroke.   S/p afib ablation 06/10/22 Patient back in persistent afib for the past week. We discussed rhythm control options today. Will plan for DCCV. If she has  quick return of her afib, may need to consider AAD or repeat ablation.  Continue Eliquis 5 mg BID  Kardia mobile for home monitoring.  Secondary Hypercoagulable State (ICD10:  D68.69) The patient is at significant risk for stroke/thromboembolism based upon her CHA2DS2-VASc Score of 3.  Continue Apixaban (Eliquis).   CAD Coronary calcium score 192 (63rd percentile) FFR negative for significant stenosis No anginal symptoms   Follow up in the AF clinic post DCCV.    Informed Consent   Shared Decision Making/Informed Consent The risks (stroke, cardiac arrhythmias rarely resulting in the need for a temporary or permanent pacemaker, skin irritation or burns and complications associated with conscious sedation including aspiration, arrhythmia, respiratory failure and death), benefits (restoration of normal sinus rhythm) and alternatives of a direct current cardioversion were explained in detail to Ms. Cortina and she agrees to proceed.       Jorja Loa PA-C Afib Clinic [facility] [address] [place], [place]  [address] [phone] 03/25/2023 4:03 PM

## 2023-03-26 DIAGNOSIS — G3184 Mild cognitive impairment, so stated: Secondary | ICD-10-CM | POA: Diagnosis not present

## 2023-04-13 NOTE — Progress Notes (Signed)
Unable to reach patient about procedure, but was able to leave a detailed message. Stated that the patient needed to arrive at the hospital at 0715 , remain NPO after 0000, needs to have a ride home and a responsible adult to stay with them for 24 hours after the procedure. Instructed the patient to call back if they had any questions.

## 2023-04-14 ENCOUNTER — Ambulatory Visit (HOSPITAL_COMMUNITY)
Admission: RE | Admit: 2023-04-14 | Discharge: 2023-04-14 | Disposition: A | Discharge status: Home / Self Care | Payer: PPO | Attending: Cardiovascular Disease | Admitting: Cardiovascular Disease

## 2023-04-14 ENCOUNTER — Ambulatory Visit (HOSPITAL_COMMUNITY): Payer: PPO | Admitting: Anesthesiology

## 2023-04-14 ENCOUNTER — Encounter (HOSPITAL_COMMUNITY): Payer: Self-pay | Admitting: Cardiovascular Disease

## 2023-04-14 ENCOUNTER — Encounter (HOSPITAL_COMMUNITY)
Admission: RE | Disposition: A | Discharge status: Home / Self Care | Payer: Self-pay | Source: Home / Self Care | Attending: Cardiovascular Disease

## 2023-04-14 DIAGNOSIS — Z539 Procedure and treatment not carried out, unspecified reason: Secondary | ICD-10-CM | POA: Insufficient documentation

## 2023-04-14 DIAGNOSIS — D6869 Other thrombophilia: Secondary | ICD-10-CM | POA: Insufficient documentation

## 2023-04-14 DIAGNOSIS — Z7901 Long term (current) use of anticoagulants: Secondary | ICD-10-CM | POA: Insufficient documentation

## 2023-04-14 DIAGNOSIS — I2510 Atherosclerotic heart disease of native coronary artery without angina pectoris: Secondary | ICD-10-CM | POA: Insufficient documentation

## 2023-04-14 DIAGNOSIS — I4819 Other persistent atrial fibrillation: Secondary | ICD-10-CM | POA: Diagnosis not present

## 2023-04-14 SURGERY — INVASIVE LAB ABORTED CASE
Anesthesia: Monitor Anesthesia Care

## 2023-04-14 MED ORDER — SODIUM CHLORIDE 0.9 % IV SOLN: INTRAVENOUS | Status: DC

## 2023-04-14 SURGICAL SUPPLY — 1 items: ELECT DEFIB PAD ADLT CADENCE (PAD) ×1 IMPLANT

## 2023-04-14 NOTE — Progress Notes (Signed)
After discussion with Metzen, she is unsure if she has missed any doses in the last 3 weeks. Given uncertainty, the procedure will be canceled and she can be rescheduled with 3 weeks of uninterrupted AC.  Gerri Spore T. Flora Lipps, MD, [facility]  [facility]  [address] [place], [place] [address] [phone]  8:56 AM

## 2023-04-14 NOTE — Anesthesia Preprocedure Evaluation (Signed)
Anesthesia Evaluation  Patient identified by MRN, date of birth, ID band Patient awake    Reviewed: Allergy & Precautions, NPO status , Patient's Chart, lab work & pertinent test results, reviewed documented beta blocker date and time   History of Anesthesia Complications Negative for: history of anesthetic complications  Airway Mallampati: II  TM Distance: >3 FB Neck ROM: Full    Dental no notable dental hx.    Pulmonary neg COPD, former smoker   breath sounds clear to auscultation       Cardiovascular (-) hypertension(-) CAD, (-) Past MI, (-) Cardiac Stents, (-) CABG and (-) CHF + dysrhythmias Atrial Fibrillation  Rhythm:Irregular Rate:Normal     Neuro/Psych   Anxiety      Neuromuscular disease    GI/Hepatic ,neg GERD  ,,(+) neg Cirrhosis        Endo/Other    Renal/GU Renal disease     Musculoskeletal  (+) Arthritis ,    Abdominal   Peds  Hematology   Anesthesia Other Findings   Reproductive/Obstetrics                              Anesthesia Physical Anesthesia Plan  ASA: 3  Anesthesia Plan: MAC   Post-op Pain Management:    Induction:   PONV Risk Score and Plan: 2 and Ondansetron  Airway Management Planned:   Additional Equipment:   Intra-op Plan:   Post-operative Plan:   Informed Consent: I have reviewed the patients History and Physical, chart, labs and discussed the procedure including the risks, benefits and alternatives for the proposed anesthesia with the patient or authorized representative who has indicated his/her understanding and acceptance.     Dental advisory given  Plan Discussed with: CRNA  Anesthesia Plan Comments:          Anesthesia Quick Evaluation

## 2023-04-14 NOTE — Interval H&P Note (Signed)
History and Physical Interval Note:  04/14/2023 8:04 AM  Shelia [name]  has presented today for surgery, with the diagnosis of AFIB.  The various methods of treatment have been discussed with the patient and family. After consideration of risks, benefits and other options for treatment, the patient has consented to  Procedure(s): CARDIOVERSION (N/A) as a surgical intervention.  The patient's history has been reviewed, patient examined, no change in status, stable for surgery.  I have reviewed the patient's chart and labs.  Questions were answered to the patient's satisfaction.    NPO for DCCV. On eliquis >3 weeks. No missed doses.  Shelia Spore T. Flora Lipps, MD, [facility]  [facility]  [address] [place], [place] [address] [phone]  8:04 AM   Shelia [name]

## 2023-04-21 ENCOUNTER — Encounter: Payer: Self-pay | Admitting: Physician Assistant

## 2023-04-21 ENCOUNTER — Encounter: Payer: Self-pay | Admitting: *Deleted

## 2023-04-21 ENCOUNTER — Ambulatory Visit: Payer: PPO | Attending: Physician Assistant | Admitting: Physician Assistant

## 2023-04-21 VITALS — BP 94/62 | HR 101 | Ht 67.0 in | Wt 137.0 lb

## 2023-04-21 DIAGNOSIS — D6869 Other thrombophilia: Secondary | ICD-10-CM

## 2023-04-21 DIAGNOSIS — I4819 Other persistent atrial fibrillation: Secondary | ICD-10-CM | POA: Diagnosis not present

## 2023-04-21 NOTE — Progress Notes (Signed)
Cardiology Office Note:  .   Date:  04/21/2023  ID:  Shelia [name], DOB 1941/04/02, MRN [id number] PCP: Shelia Bookman, NP  [facility] Providers Cardiologist:  Shelia Millers, MD Electrophysiologist:  Shelia Jorja Loa, MD {  History of Present Illness: Shelia [name] is a 82 y.o. female w/PMHx of CAD (non-obstructive by CT/neg FFR in 2022), HLD, AFib  She saw Dr. Elberta [name] 09/08/22, doing well, no symptoms of AFib, no changes made  [facility] Cardiology team May 2024, doing well, walking regularly for exercise.  Saw the AFib clinic 03/25/23, increased SOB, some dizziness, kardia noted Afib, no clear trigger, planned for DCCV  When she arrived to DCCV 04/14/23, reported uncertain compliance with her OAC and was canceled.  Today's visit is scheduled as an overdue 6 mo visit  ROS:   She comes today accompanied by her husband She has some concerns about having so many cardioversions, mentions one of the nurses at the hospital suggested that a 3rd DCCV seemed excessive. She is a bit vaguely aware of her Afib, no overt palpitations, but does note that she Shelia get a bit winded unusually, more tired when in Afib  No CP No dizzy spells, near syncope or syncope. No bleeding, signs of bleeding  She reports eliquis compliance for sure, since 04/14/23    Arrhythmia/AAD hx AFib diagnosed 2021 PVI ablation 06/10/22  Studies Reviewed: Marland Kitchen    EKG done today and reviewed by myself:  AFib 79bpm   08/27/21: TTE 1. Left ventricular ejection fraction, by estimation, is 60 to 65%. The  left ventricle has normal function. The left ventricle has no regional  wall motion abnormalities. Left ventricular diastolic parameters were  normal. The average left ventricular  global longitudinal strain is -22.9 %. The global longitudinal strain is  normal.   2. Right ventricular systolic function is normal. The right ventricular  size is normal. There is mildly elevated pulmonary  artery systolic  pressure.   3. Left atrial size was moderately dilated.   4. Right atrial size was moderately dilated.   5. The mitral valve is normal in structure. Mild mitral valve  regurgitation. No evidence of mitral stenosis.   6. Tricuspid valve regurgitation is mild to moderate.   7. The aortic valve is tricuspid. Aortic valve regurgitation is mild. No  aortic stenosis is present.   8. Pulmonic valve regurgitation is moderate.   9. The inferior vena cava is dilated in size with <50% respiratory  variability, suggesting right atrial pressure of 15 mmHg.   Comparison(s): No significant change from prior study. 10/31/19 EF 60-65%.    Risk Assessment/Calculations:    Physical Exam:   VS:  There were no vitals taken for this visit.   Wt Readings from Last 3 Encounters:  04/14/23 135 lb (61.2 kg)  03/25/23 136 lb 6.4 oz (61.9 kg)  03/19/23 138 lb 6.4 oz (62.8 kg)    GEN: Well nourished, well developed in no acute distress NECK: No JVD; No carotid bruits CARDIAC: irreg-irreg, no murmurs, rubs, gallops RESPIRATORY:   CTA b/l without rales, wheezing or rhonchi  ABDOMEN: Soft, non-tender, non-distended EXTREMITIES:  No edema; No deformity    ASSESSMENT AND PLAN: .    persistent AFib CHA2DS2Vasc is 3, on Eliquis, appropriately dosed Rate controlled  Long discussion about AFib management strategies Her last DCCV was back in 2002 This is the 1st episode of Afib post PVI ablation (Nov 2023) that she is aware of.  I would not pursue AAD yet. Recommend DCCV and see if she has ERAF/what her AF burden Shelia be going forward If she has early AFib would revisit AAD towards and or as a bridge to repeat ablation  Discussed the importance of eliquis compliance/no missed doses Shelia look to DCCV after 05/07/23   Secondary hypercoagulable state 2/2 AFib     Informed Consent   Shared Decision Making/Informed Consent The risks (stroke, cardiac arrhythmias rarely resulting in the  need for a temporary or permanent pacemaker, skin irritation or burns and complications associated with conscious sedation including aspiration, arrhythmia, respiratory failure and death), benefits (restoration of normal sinus rhythm) and alternatives of a direct current cardioversion were explained in detail to Shelia [name] and she agrees to proceed.          Dispo: Shelia have her back in 3 mo, sooner if needed   Signed, Shelia Pigeon, PA-C

## 2023-04-21 NOTE — Patient Instructions (Signed)
Medication Instructions:   Your physician recommends that you continue on your current medications as directed. Please refer to the Current Medication list given to you today.  *If you need a refill on your cardiac medications before your next appointment, please call your pharmacy*   Lab Work:  BMET AND CBC TODAY   If you have labs (blood work) drawn today and your tests are completely normal, you will receive your results only by: MyChart Message (if you have MyChart) OR A paper copy in the mail If you have any lab test that is abnormal or we need to change your treatment, we will call you to review the results.   Testing/Procedures: NONE ORDERED  TODAY     Follow-Up: At [facility], you and your health needs are our priority.  As part of our continuing mission to provide you with exceptional heart care, we have created designated Provider Care Teams.  These Care Teams include your primary Cardiologist (physician) and Advanced Practice Providers (APPs -  Physician Assistants and Nurse Practitioners) who all work together to provide you with the care you need, when you need it.  We recommend signing up for the patient portal called "MyChart".  Sign up information is provided on this After Visit Summary.  MyChart is used to connect with patients for Virtual Visits (Telemedicine).  Patients are able to view lab/test results, encounter notes, upcoming appointments, etc.  Non-urgent messages can be sent to your provider as well.   To learn more about what you can do with MyChart, go to [email or web address].    Your next appointment:   3 month(s) ( CONTACT  CASSIE HALL/ ANGELINE HAMMER FOR EP SCHEDULING ISSUES   Provider:   Loman Brooklyn, MD    Other Instructions

## 2023-04-22 ENCOUNTER — Ambulatory Visit (HOSPITAL_COMMUNITY): Payer: PPO | Admitting: Physician Assistant

## 2023-04-22 LAB — CBC
Hematocrit: 44.5 % (ref 34.0–46.6)
Hemoglobin: 14.4 g/dL (ref 11.1–15.9)
MCH: 33.1 pg — ABNORMAL HIGH (ref 26.6–33.0)
MCHC: 32.4 g/dL (ref 31.5–35.7)
MCV: 102 fL — ABNORMAL HIGH (ref 79–97)
Platelets: 142 10*3/uL — ABNORMAL LOW (ref 150–450)
RBC: 4.35 x10E6/uL (ref 3.77–5.28)
RDW: 11.5 % — ABNORMAL LOW (ref 11.7–15.4)
WBC: 4.9 10*3/uL (ref 3.4–10.8)

## 2023-04-22 LAB — BASIC METABOLIC PANEL
BUN/Creatinine Ratio: 31 — ABNORMAL HIGH (ref 12–28)
BUN: 23 mg/dL (ref 8–27)
CO2: 25 mmol/L (ref 20–29)
Calcium: 9 mg/dL (ref 8.7–10.3)
Chloride: 106 mmol/L (ref 96–106)
Creatinine, Ser: 0.75 mg/dL (ref 0.57–1.00)
Glucose: 63 mg/dL — ABNORMAL LOW (ref 70–99)
Potassium: 4.5 mmol/L (ref 3.5–5.2)
Sodium: 143 mmol/L (ref 134–144)
eGFR: 80 mL/min/{1.73_m2} (ref >59)

## 2023-04-24 ENCOUNTER — Other Ambulatory Visit: Payer: Self-pay

## 2023-04-29 ENCOUNTER — Other Ambulatory Visit (HOSPITAL_COMMUNITY): Payer: Self-pay

## 2023-04-30 ENCOUNTER — Encounter: Payer: PPO | Admitting: Family

## 2023-04-30 DIAGNOSIS — G3184 Mild cognitive impairment, so stated: Secondary | ICD-10-CM | POA: Diagnosis not present

## 2023-05-01 ENCOUNTER — Encounter: Payer: Self-pay | Admitting: Adult Health

## 2023-05-01 ENCOUNTER — Ambulatory Visit (INDEPENDENT_AMBULATORY_CARE_PROVIDER_SITE_OTHER): Payer: PPO | Admitting: Adult Health

## 2023-05-01 VITALS — BP 121/88 | HR 71 | Temp 95.5°F | Resp 18 | Ht 67.0 in | Wt 137.2 lb

## 2023-05-01 DIAGNOSIS — Z0000 Encounter for general adult medical examination without abnormal findings: Secondary | ICD-10-CM

## 2023-05-01 NOTE — Progress Notes (Signed)
This encounter was created in error - please disregard.

## 2023-05-01 NOTE — Progress Notes (Signed)
Subjective:   Shelia [name] is a 82 y.o. female who presents for Medicare Annual (Subsequent) preventive examination.  Visit Complete: In person  Patient Medicare AWV questionnaire was completed by the patient on 05/01/23; I have confirmed that all information answered by patient is correct and no changes since this date.        Objective:    Today's Vitals   05/01/23 1425 05/01/23 1459  BP: 121/88   Pulse: 71   Resp: 18   Temp: (!) 95.5 F (35.3 C)   SpO2: 98%   Weight: 137 lb 3.2 oz (62.2 kg)   Height: 5\' 7"  (1.702 m)   PainSc:  3    Body mass index is 21.49 kg/m.     05/01/2023    2:27 PM 05/01/2023   11:34 AM 04/14/2023    7:57 AM 10/29/2022   10:20 AM 08/11/2022    2:30 PM 06/10/2022    5:56 AM 04/28/2022    3:38 PM  Advanced Directives  Does Patient Have a Medical Advance Directive? Yes Yes Yes Yes Yes Yes Yes  Type of Estate agent of [place];Living will Healthcare Power of [place];Living will Healthcare Power of [place];Living will Healthcare Power of [place];Living will;Out of facility DNR (pink MOST or yellow form) Healthcare Power of [place];Living will;Out of facility DNR (pink MOST or yellow form) Healthcare Power of [place];Living will Healthcare Power of [place];Living will  Does patient want to make changes to medical advance directive? No - Patient declined No - Patient declined  No - Patient declined No - Patient declined  No - Patient declined  Copy of Healthcare Power of Attorney in Chart? Yes - validated most recent copy scanned in chart (See row information) Yes - validated most recent copy scanned in chart (See row information)   Yes - validated most recent copy scanned in chart (See row information) No - copy requested Yes - validated most recent copy scanned in chart (See row information)    Current Medications (verified) Outpatient Encounter Medications as of 05/01/2023  Medication Sig   acetaminophen (TYLENOL)  325 MG tablet Take 325 mg by mouth every 6 (six) hours as needed for moderate pain or mild pain.   amoxicillin (AMOXIL) 500 MG capsule Take 4 capsules (2,000 mg total) by mouth 1 hour prior to appointment.   apixaban (ELIQUIS) 5 MG TABS tablet Take 1 tablet (5 mg total) by mouth 2 (two) times daily.   Brimonidine Tartrate (LUMIFY) 0.025 % SOLN Place 1 drop into both eyes daily. Red eyes   Carboxymeth-Glyc-Polysorb PF (REFRESH OPTIVE MEGA-3) 0.5-1-0.5 % SOLN Place 1 drop into both eyes 4 (four) times daily as needed (dry eyes). Enhance with flax seed oil   hydrocortisone cream 0.5 % Apply 1 Application topically 2 (two) times daily as needed for itching.   ketotifen (ZADITOR) 0.025 % ophthalmic solution Place 1 drop into both eyes 2 (two) times daily as needed (itchy eyes). In the fall   loratadine (CLARITIN) 10 MG tablet Take 10 mg by mouth daily as needed for allergies.   mineral oil-hydrophilic petrolatum (AQUAPHOR) ointment Apply 1 Application topically as needed for dry skin.   Multiple Vitamin (ONE-DAILY MULTI VITAMINS PO) Take 30 mLs by mouth daily. [name] liquid vitamin   NON FORMULARY Take 1,000 mg by mouth daily. Plant calcium   OVER THE COUNTER MEDICATION Place 1 application  into both eyes at bedtime. Pure & Clean   rosuvastatin (CRESTOR) 10 MG tablet Take 1  tablet (10 mg total) by mouth daily.   triamcinolone cream (KENALOG) 0.1 % Apply 1 Application topically 2 (two) times daily as needed (itching).   No facility-administered encounter medications on file as of 05/01/2023.    Allergies (verified) Patient has no known allergies.   History: Past Medical History:  Diagnosis Date   Anxiety    Arthritis    "left knee; left hip; lower back; mild to moderate in right hip" (09/30/2016)   Atrial fibrillation (HCC)    Chronic lower back pain    Gastritis    Heart palpitations    History of bone density study 2018   History of mammogram 2021   History of MRI 2018   History of  Papanicolaou smear of cervix    Hypercholesteremia    Osteopenia    Seasonal allergies    Past Surgical History:  Procedure Laterality Date   ATRIAL FIBRILLATION ABLATION N/A 06/10/2022   Procedure: ATRIAL FIBRILLATION ABLATION;  Surgeon: Regan Lemming, MD;  Location: [facility] INVASIVE CV LAB;  Service: Cardiovascular;  Laterality: N/A;   CARDIOVERSION N/A 11/17/2019   Procedure: CARDIOVERSION;  Surgeon: Lewayne Bunting, MD;  Location: [facility] ENDOSCOPY;  Service: Cardiovascular;  Laterality: N/A;   CARDIOVERSION N/A 04/16/2021   Procedure: CARDIOVERSION;  Surgeon: Chilton Si, MD;  Location: [facility] ENDOSCOPY;  Service: Cardiovascular;  Laterality: N/A;   CARDIOVERSION N/A 08/15/2021   Procedure: CARDIOVERSION;  Surgeon: Maisie Fus, MD;  Location: [facility] ENDOSCOPY;  Service: Cardiovascular;  Laterality: N/A;   COLONOSCOPY     DILATION AND CURETTAGE OF UTERUS     JOINT REPLACEMENT     TONSILLECTOMY     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   Procedure: TOTAL HIP ARTHROPLASTY ANTERIOR APPROACH;  Surgeon: Jodi Geralds, MD;  Location: [facility] OR;  Service: Orthopedics;  Laterality: Left;   Family History  Problem Relation Age of Onset   Hypertension Mother    Cancer Father        lung cancer   Diabetes Sister    Anxiety disorder Daughter    Breast cancer Neg Hx    Social History   Socioeconomic History   Marital status: Married    Spouse name: Not on file   Number of children: 3   Years of education: Not on file   Highest education level: Not on file  Occupational History   Not on file  Tobacco Use   Smoking status: Former    Current packs/day: 2.00    Types: Cigarettes   Smokeless tobacco: Never   Tobacco comments:    Former smoker 07/08/22  Vaping Use   Vaping status: Never Used  Substance and Sexual Activity   Alcohol use: Not Currently   Drug use: Never   Sexual activity: Yes  Other Topics Concern   Not on file  Social History Narrative   Tobacco use, amount per day  now: None.   Past tobacco use, amount per day: Maximum 2 packs    How many years did you use tobacco: Quit 1985   Alcohol use (drinks per week): 3   Diet: Vegetables, Chicken, Fish, Grains, and Fruit.   Do you drink/eat things with caffeine: Coffee   Marital status: Married                                  What year were you married? 1969   Do you live in a house, apartment, assisted living,  condo, trailer, etc.? House.   Is it one or more stories? 2 stories.   How many persons live in your home? 2   Do you have pets in your home?( please list) No.   Highest Level of education completed? Masters in Adult Educations.   Current or past profession: Art gallery manager    Do you exercise? Yes.                                  Type and how often? Floor excercises 5 times week. Walk 2 miles 6 times week.   Do you have a living will? Yes   Do you have a DNR form?  Yes                                 If not, do you want to discuss one?   Do you have signed POA/HPOA forms?  Yes                      If so, please bring to you appointment      Do you have any difficulty bathing or dressing yourself? No   Do you have any difficulty preparing food or eating? No   Do you have any difficulty managing your medications? No   Do you have any difficulty managing your finances? No   Do you have any difficulty affording your medications? No   Social Determinants of Health   Financial Resource Strain: Not on file  Food Insecurity: No Food Insecurity (05/01/2023)   Hunger Vital Sign    Worried About Running Out of Food in the Last Year: Never true    Ran Out of Food in the Last Year: Never true  Transportation Needs: No Transportation Needs (05/01/2023)   PRAPARE - Administrator, Civil Service (Medical): No    Lack of Transportation (Non-Medical): No  Physical Activity: Not on file  Stress: Stress Concern Present (05/01/2023)   [employer] of Occupational Health - Occupational Stress  Questionnaire    Feeling of Stress : To some extent  Social Connections: Unknown (11/12/2022)   [facility]   Social Network    Social Network: Not on file    Tobacco Counseling Counseling given: Not Answered Tobacco comments: Former smoker 07/08/22   Clinical Intake:  Pre-visit preparation completed: No  Pain : 0-10 Pain Score: 3  Pain Location: Other (Comment) Pain Orientation: Left Pain Descriptors / Indicators: Aching Pain Onset: More than a month ago Pain Frequency: Intermittent Pain Relieving Factors: stretching Effect of Pain on Daily Activities: none  Pain Relieving Factors: stretching  BMI - recorded: 21.14 Nutritional Status: BMI of 19-24  Normal Nutritional Risks: None Diabetes: No  How often do you need to have someone help you when you read instructions, pamphlets, or other written materials from your doctor or pharmacy?: 1 - Never What is the last grade level you completed in school?: Masters Degree and [employer]  Interpreter Needed?: No  Information entered by :: Samanvitha Germany Medina-Vargas DNP   Activities of Daily Living    05/01/2023    2:25 PM 04/14/2023    7:54 AM  In your present state of health, do you have any difficulty performing the following activities:  Hearing? 0 0  Vision? 0 0  Difficulty concentrating or  making decisions? 0 0  Walking or climbing stairs? 0 0  Dressing or bathing? 0 0  Doing errands, shopping? 0     Patient Care Team: [name], [name], NP as PCP - General (Family Medicine) Jens Som Madolyn Frieze, MD as PCP - Cardiology (Cardiology) Regan Lemming, MD as PCP - Electrophysiology (Cardiology) Richarda Overlie, MD as Consulting Physician (Obstetrics and Gynecology) Jens Som Madolyn Frieze, MD as Consulting Physician (Cardiology) Rodrigo Ran, OD (Ophthalmology)  Indicate any recent Medical Services you may have received from other than [facility] providers in the past year (date may be  approximate).     Assessment:   This is a routine wellness examination for Mariena.  Hearing/Vision screen No results found.   Goals Addressed               This Visit's Progress     Exercise 3x per week (30 min per time) (pt-stated)        Does weights at home, 5 lbs each barbel      Depression Screen    05/01/2023    2:28 PM 05/01/2023   11:33 AM 04/28/2022    3:32 PM 12/04/2021    3:01 PM 04/23/2021    9:56 AM  PHQ 2/9 Scores  PHQ - 2 Score 0 0 0 0 0  PHQ- 9 Score 0        Fall Risk    05/01/2023    2:27 PM 05/01/2023   11:33 AM 12/23/2022    3:02 PM 10/29/2022   10:19 AM 10/27/2022   11:03 AM  Fall Risk   Falls in the past year? 0 0 1 1 1   Number falls in past yr: 0 0 1 1 1   Injury with Fall? 0 0 0 1 1  Risk for fall due to : No Fall Risks No Fall Risks History of fall(s) History of fall(s) Other (Comment)  Risk for fall due to: Comment     Patient states that she trip over a doggie gate  Follow up Falls evaluation completed Falls evaluation completed Falls evaluation completed Falls evaluation completed Falls evaluation completed    MEDICARE RISK AT HOME:    TIMED UP AND GO:  Was the test performed?  No    Cognitive Function:    04/28/2022    3:33 PM 12/03/2020   10:27 AM  MMSE - Mini Mental State Exam  Orientation to time 5 5  Orientation to time comments year 2023, season Fall, date 2nd, day Monday, month October.   Orientation to Place 5 5  Orientation to Place-comments state [place], county [place], city [place], facility [employer], provider [phone].   Registration 3 3  Registration-comments apple, table, penny.   Attention/ Calculation 5 5  Recall 2 3  Recall-comments apple, penny, N/A   Language- name 2 objects 2 2  Language- repeat 1 1  Language- follow 3 step command 3 2  Language- read & follow direction 1 1  Write a sentence 1 1  Copy design 0 1  Total score 28 29        04/23/2021   10:00 AM  6CIT Screen  What Year? 0  points  What month? 0 points  What time? 0 points  Count back from 20 0 points  Months in reverse 0 points  Repeat phrase 0 points  Total Score 0 points    Immunizations Immunization History  Administered Date(s) Administered   Fluad Quad(high Dose 65+) 06/03/2022   Influenza Split 04/30/2015,  04/20/2016, 04/27/2018, 06/16/2019   Influenza, High Dose Seasonal PF 06/28/2017, 06/16/2019, 06/02/2020   Influenza-Unspecified 06/19/2021   PFIZER(Purple Top)SARS-COV-2 Vaccination 08/12/2019, 09/02/2019, 05/01/2020, 11/13/2020, 04/12/2021   Pfizer(Comirnaty)Fall Seasonal Vaccine 12 years and older 07/31/2022   Pneumococcal Conjugate-13 09/30/2013   Pneumococcal Polysaccharide-23 07/27/2003, 09/24/2010   Td 04/26/2001, 12/25/2017   Tdap 09/24/2010   Zoster Recombinant(Shingrix) 11/16/2017, 01/06/2018   Zoster, Live 12/11/2006, 04/27/2018, 06/29/2018    TDAP status: Up to date  Flu Vaccine status: Due, Education has been provided regarding the importance of this vaccine. Advised may receive this vaccine at local pharmacy or Health Dept. Aware to provide a copy of the vaccination record if obtained from local pharmacy or Health Dept. Verbalized acceptance and understanding.  Pneumococcal vaccine status: Up to date  Covid-19 vaccine status: Information provided on how to obtain vaccines.   Qualifies for Shingles Vaccine? Yes   Zostavax completed Yes   Shingrix Completed?: Yes  Screening Tests Health Maintenance  Topic Date Due   INFLUENZA VACCINE  02/26/2023   COVID-19 Vaccine (7 - 2023-24 season) 03/29/2023   Medicare Annual Wellness (AWV)  04/30/2024   DTaP/Tdap/Td (4 - Td or Tdap) 12/26/2027   Pneumonia Vaccine 64+ Years old  Completed   DEXA SCAN  Completed   Zoster Vaccines- Shingrix  Completed   HPV VACCINES  Aged Out   Hepatitis C Screening  Discontinued    Health Maintenance  Health Maintenance Due  Topic Date Due   INFLUENZA VACCINE  02/26/2023   COVID-19 Vaccine  (7 - 2023-24 season) 03/29/2023    Colorectal cancer screening: Type of screening: Colonoscopy. Completed 10 years ago. Repeat every 0 years  Mammogram status: Ordered next week. Pt provided with contact info and advised to call to schedule appt.   Bone Density status: Completed 11/30/20. Results reflect: Bone density results: OSTEOPENIA. Repeat every 0 years.  Lung Cancer Screening: (Low Dose CT Chest recommended if Age 106-80 years, 20 pack-year currently smoking OR have quit w/in 15years.) does not qualify.   Lung Cancer Screening Referral: No  Additional Screening:  Hepatitis C Screening: does not qualify; Completed No  Vision Screening: Recommended annual ophthalmology exams for early detection of glaucoma and other disorders of the eye. Is the patient up to date with their annual eye exam?  Yes  Who is the provider or what is the name of the office in which the patient attends annual eye exams? [facility] If pt is not established with a provider, would they like to be referred to a provider to establish care? No .   Dental Screening: Recommended annual dental exams for proper oral hygiene  Diabetic Foot Exam: Not diabetic  Community Resource Referral / Chronic Care Management: CRR required this visit?  No   CCM required this visit?  No     Plan:     I have personally reviewed and noted the following in the patient's chart:   Medical and social history Use of alcohol, tobacco or illicit drugs  Current medications and supplements including opioid prescriptions. Patient is not currently taking opioid prescriptions. Functional ability and status Nutritional status Physical activity Advanced directives List of other physicians Hospitalizations, surgeries, and ER visits in previous 12 months Vitals Screenings to include cognitive, depression, and falls Referrals and appointments  In addition, I have reviewed and discussed with patient certain preventive  protocols, quality metrics, and best practice recommendations. A written personalized care plan for preventive services as well as general preventive health recommendations were provided to  patient.     Christepher Melchior Medina-Vargas, NP   05/01/2023   After Visit Summary: (In Person-Declined) Patient declined AVS at this time.  Nurse Notes:  Needs this to be done annually

## 2023-05-07 DIAGNOSIS — Z1231 Encounter for screening mammogram for malignant neoplasm of breast: Secondary | ICD-10-CM | POA: Diagnosis not present

## 2023-05-07 DIAGNOSIS — Z96642 Presence of left artificial hip joint: Secondary | ICD-10-CM | POA: Diagnosis not present

## 2023-05-07 DIAGNOSIS — M8588 Other specified disorders of bone density and structure, other site: Secondary | ICD-10-CM | POA: Diagnosis not present

## 2023-05-07 LAB — HM MAMMOGRAPHY

## 2023-05-07 LAB — HM DEXA SCAN

## 2023-05-08 ENCOUNTER — Encounter: Payer: Self-pay | Admitting: Family

## 2023-05-08 ENCOUNTER — Telehealth: Payer: Self-pay | Admitting: Cardiology

## 2023-05-08 NOTE — Telephone Encounter (Signed)
Patient called and said that she missed a dose of her Eliquis and she is have a cardioversion on 10/15. She wants to know what to do next

## 2023-05-08 NOTE — Telephone Encounter (Signed)
Aware Shelia [name] will call her next week to r/s DCCV. Pt agreeable to plan.

## 2023-05-11 NOTE — Telephone Encounter (Signed)
Pt informed that Edson Snowball is not in this week. Apologized for my mistake. Aware I will follow up w/ her on r/s DCCV. Pt agreeable to plan.

## 2023-05-11 NOTE — Progress Notes (Signed)
Called pt to give pre-procedure instructions however pt stated that she missed a dose of eliquis and is getting rescheduled by the AFIB clinic. Reached out to scheduler to make her aware.

## 2023-05-12 ENCOUNTER — Ambulatory Visit (HOSPITAL_COMMUNITY): Admission: RE | Admit: 2023-05-12 | Payer: PPO | Source: Home / Self Care | Admitting: Internal Medicine

## 2023-05-12 ENCOUNTER — Encounter (HOSPITAL_COMMUNITY): Admission: RE | Payer: Self-pay | Source: Home / Self Care

## 2023-05-12 SURGERY — CARDIOVERSION
Anesthesia: General

## 2023-05-15 NOTE — Telephone Encounter (Signed)
Left message to call back  

## 2023-05-18 DIAGNOSIS — R922 Inconclusive mammogram: Secondary | ICD-10-CM | POA: Diagnosis not present

## 2023-05-18 LAB — HM MAMMOGRAPHY

## 2023-05-18 NOTE — Telephone Encounter (Signed)
Pt returning nurses call regarding rescheduling Cardioversion. Please advise

## 2023-05-19 ENCOUNTER — Encounter: Payer: Self-pay | Admitting: Cardiology

## 2023-05-19 NOTE — Telephone Encounter (Signed)
Left message to call back  

## 2023-05-19 NOTE — Telephone Encounter (Signed)
Pt was returning nurse Sherri's call regarding r/s cardioversion. Pt also sent message on MyChart. Please advise.

## 2023-05-20 ENCOUNTER — Encounter: Payer: Self-pay | Admitting: *Deleted

## 2023-05-20 ENCOUNTER — Other Ambulatory Visit: Payer: Self-pay

## 2023-05-20 ENCOUNTER — Ambulatory Visit (HOSPITAL_COMMUNITY)
Admission: RE | Admit: 2023-05-20 | Discharge: 2023-05-20 | Disposition: A | Discharge status: Home / Self Care | Payer: PPO | Source: Ambulatory Visit | Attending: Internal Medicine | Admitting: Internal Medicine

## 2023-05-20 VITALS — BP 106/80 | HR 91 | Ht 67.0 in | Wt 137.8 lb

## 2023-05-20 DIAGNOSIS — R9431 Abnormal electrocardiogram [ECG] [EKG]: Secondary | ICD-10-CM | POA: Insufficient documentation

## 2023-05-20 DIAGNOSIS — I4891 Unspecified atrial fibrillation: Secondary | ICD-10-CM | POA: Diagnosis not present

## 2023-05-20 DIAGNOSIS — D6869 Other thrombophilia: Secondary | ICD-10-CM | POA: Insufficient documentation

## 2023-05-20 DIAGNOSIS — I2510 Atherosclerotic heart disease of native coronary artery without angina pectoris: Secondary | ICD-10-CM | POA: Diagnosis not present

## 2023-05-20 DIAGNOSIS — E785 Hyperlipidemia, unspecified: Secondary | ICD-10-CM | POA: Insufficient documentation

## 2023-05-20 DIAGNOSIS — Z7901 Long term (current) use of anticoagulants: Secondary | ICD-10-CM | POA: Diagnosis not present

## 2023-05-20 DIAGNOSIS — I4819 Other persistent atrial fibrillation: Secondary | ICD-10-CM | POA: Diagnosis not present

## 2023-05-20 LAB — BASIC METABOLIC PANEL
Anion gap: 7 (ref 5–15)
BUN: 26 mg/dL — ABNORMAL HIGH (ref 8–23)
CO2: 26 mmol/L (ref 22–32)
Calcium: 9.2 mg/dL (ref 8.9–10.3)
Chloride: 107 mmol/L (ref 98–111)
Creatinine, Ser: 0.84 mg/dL (ref 0.44–1.00)
GFR, Estimated: >60 mL/min (ref >60)
Glucose, Bld: 120 mg/dL — ABNORMAL HIGH (ref 70–99)
Potassium: 3.9 mmol/L (ref 3.5–5.1)
Sodium: 140 mmol/L (ref 135–145)

## 2023-05-20 LAB — CBC
HCT: 41.7 % (ref 36.0–46.0)
Hemoglobin: 14 g/dL (ref 12.0–15.0)
MCH: 32.8 pg (ref 26.0–34.0)
MCHC: 33.6 g/dL (ref 30.0–36.0)
MCV: 97.7 fL (ref 80.0–100.0)
Platelets: 141 10*3/uL — ABNORMAL LOW (ref 150–400)
RBC: 4.27 MIL/uL (ref 3.87–5.11)
RDW: 11.6 % (ref 11.5–15.5)
WBC: 4.7 10*3/uL (ref 4.0–10.5)
nRBC: 0 % (ref 0.0–0.2)

## 2023-05-20 NOTE — Progress Notes (Addendum)
Primary Care Physician: Shelia Bookman, NP Primary Cardiologist: Dr Shelia [name] Primary Electrophysiologist: Dr Shelia [name] Referring Physician: Ulyses [name] triage    Shelia [name] is a 82 y.o. female with a history of HLD, CAD, atrial fibrillation who presents for follow up in the [facility].  The patient was initially diagnosed with atrial fibrillation early 2021 and underwent DCCV on 11/17/19. She had recurrent symptoms of persistent afib and had repeat DCCV on 04/16/21. Patient is on Eliquis for a CHADS2VASC score of 4. She presented to urgent care 07/24/21 with palpitations and mild SOB. ECG showed rate controlled afib. There were no specific triggers that she could identify. Patient is s/p DCCV on 08/15/21.   Patient is s/p afib ablation with Dr Shelia [name] on 06/10/22.   On follow up today, patient reports that about one week ago she noticed more fatigue and SOB with exertion. She has also had some intermittent dizziness. She checked her Shelia [name] mobile two days ago which showed afib. There were no specific triggers that she could identify. She did miss a dose of Eliquis but is sure she has not missed any since 8/27.  On follow up 05/20/23, she is currently in Afib. She feels SOB with exertion when in Afib. She missed a dose of Eliquis on 9/17 and DCCV was rescheduled to 10/28. She has not missed any more doses of Eliquis since then.    Today, she denies symptoms of palpitations, chest pain, orthopnea, PND, lower extremity edema, presyncope, syncope, snoring, daytime somnolence, bleeding, or neurologic sequela. The patient is tolerating medications without difficulties and is otherwise without complaint today.    Atrial Fibrillation Risk Factors:  she does not have symptoms or diagnosis of sleep apnea. she does not have a history of rheumatic fever. she does not have a history of alcohol use.   Atrial Fibrillation Management history:  Previous antiarrhythmic  drugs: none Previous cardioversions: 11/17/19, 04/16/21 Previous ablations: 06/10/22 Anticoagulation history: Eliquis   Past Medical History:  Diagnosis Date   Anxiety    Arthritis    "left knee; left hip; lower back; mild to moderate in right hip" (09/30/2016)   Atrial fibrillation (HCC)    Chronic lower back pain    Gastritis    Heart palpitations    History of bone density study 2018   History of mammogram 2021   History of MRI 2018   History of Papanicolaou smear of cervix    Hypercholesteremia    Osteopenia    Seasonal allergies     Current Outpatient Medications  Medication Sig Dispense Refill   acetaminophen (TYLENOL) 325 MG tablet Take 325 mg by mouth every 6 (six) hours as needed for moderate pain or mild pain.     amoxicillin (AMOXIL) 500 MG capsule Take 4 capsules (2,000 mg total) by mouth 1 hour prior to appointment. 4 capsule 0   apixaban (ELIQUIS) 5 MG TABS tablet Take 1 tablet (5 mg total) by mouth 2 (two) times daily. 60 tablet 5   Brimonidine Tartrate (LUMIFY) 0.025 % SOLN Place 1 drop into both eyes daily. Red eyes     Carboxymeth-Glyc-Polysorb PF (REFRESH OPTIVE MEGA-3) 0.5-1-0.5 % SOLN Place 1 drop into both eyes 4 (four) times daily as needed (dry eyes). Enhance with flax seed oil     hydrocortisone cream 0.5 % Apply 1 Application topically 2 (two) times daily as needed for itching.     ketotifen (ZADITOR) 0.025 % ophthalmic solution Place 1 drop into both eyes  2 (two) times daily as needed (itchy eyes). In the fall     loratadine (CLARITIN) 10 MG tablet Take 10 mg by mouth daily as needed for allergies.     mineral oil-hydrophilic petrolatum (AQUAPHOR) ointment Apply 1 Application topically as needed for dry skin.     Multiple Vitamin (ONE-DAILY MULTI VITAMINS PO) Take 30 mLs by mouth daily. Shelia [name] liquid vitamin     NON FORMULARY Take 1,000 mg by mouth daily. Plant calcium     OVER THE COUNTER MEDICATION Place 1 application  into both eyes at bedtime. Pure &  Clean     rosuvastatin (CRESTOR) 10 MG tablet Take 1 tablet (10 mg total) by mouth daily. 90 tablet 3   triamcinolone cream (KENALOG) 0.1 % Apply 1 Application topically 2 (two) times daily as needed (itching).     No current facility-administered medications for this encounter.    ROS- All systems are reviewed and negative except as per the HPI above.  Physical Exam: Vitals:   05/20/23 1501  BP: 106/80  Pulse: 91  Weight: 62.5 kg  Height: 5\' 7"  (1.702 m)    GEN- The patient is well appearing, alert and oriented x 3 today.   Neck - no JVD or carotid bruit noted Lungs- Clear to ausculation bilaterally, normal work of breathing Heart- Irregular rate and rhythm, no murmurs, rubs or gallops, PMI not laterally displaced Extremities- no clubbing, cyanosis, or edema Skin - no rash or ecchymosis noted   Wt Readings from Last 3 Encounters:  05/20/23 62.5 kg  05/01/23 62.2 kg  04/21/23 62.1 kg    ECG today demonstrates  Vent. rate 91 BPM PR interval * ms QRS duration 78 ms QT/QTcB 352/432 ms P-R-T axes * -38 37 Atrial fibrillation Left axis deviation Low voltage QRS Abnormal ECG When compared with ECG of 21-Apr-2023 12:12, PREVIOUS ECG IS PRESENT  Echo 10/31/19 demonstrated   1. Left ventricular ejection fraction, by estimation, is 60 to 65%. The  left ventricle has normal function. The left ventricle has no regional  wall motion abnormalities. Left ventricular diastolic parameters were  normal. The average left ventricular global longitudinal strain is -22.9 %. The global longitudinal strain is normal.   2. Right ventricular systolic function is normal. The right ventricular  size is normal. There is mildly elevated pulmonary artery systolic  pressure.   3. Left atrial size was moderately dilated.   4. Right atrial size was moderately dilated.   5. The mitral valve is normal in structure. Mild mitral valve  regurgitation. No evidence of mitral stenosis.   6. Tricuspid  valve regurgitation is mild to moderate.   7. The aortic valve is tricuspid. Aortic valve regurgitation is mild. No  aortic stenosis is present.   8. Pulmonic valve regurgitation is moderate.   9. The inferior vena cava is dilated in size with <50% respiratory  variability, suggesting right atrial pressure of 15 mmHg.   Comparison(s): No significant change from prior study. 10/31/19 EF 60-65%.   Conclusion(s)/Recommendation(s): Otherwise normal echocardiogram, with  minor abnormalities described in the report.   Epic records are reviewed at length today  CHA2DS2-VASc Score = 3  The patient's score is based upon: CHF History: 0 HTN History: 0 Diabetes History: 0 Stroke History: 0 Vascular Disease History: 0 Age Score: 2 Gender Score: 1       ASSESSMENT AND PLAN: Persistent Atrial Fibrillation (ICD10:  I48.19) The patient's CHA2DS2-VASc score is 3, indicating a 3.2% annual risk of stroke.  S/p afib ablation 06/10/22.  Patient is currently in persistent Afib. She missed a dose of Eliquis on 9/17 so DCCV was rescheduled. We will still proceed as previously planned for DCCV. If she has quick return of her afib, may need to consider AAD or repeat ablation.  Continue Eliquis 5 mg BID.  Kardia mobile for home monitoring.  Labs today.  Secondary Hypercoagulable State (ICD10:  D68.69) The patient is at significant risk for stroke/thromboembolism based upon her CHA2DS2-VASc Score of 3.  Continue Apixaban (Eliquis).   CAD Coronary calcium score 192 (63rd percentile) FFR negative for significant stenosis No anginal symptoms   Follow up in the AF clinic post DCCV.    Informed Consent   Shared Decision Making/Informed Consent The risks (stroke, cardiac arrhythmias rarely resulting in the need for a temporary or permanent pacemaker, skin irritation or burns and complications associated with conscious sedation including aspiration, arrhythmia, respiratory failure and death), benefits  (restoration of normal sinus rhythm) and alternatives of a direct current cardioversion were explained in detail to Shelia [name] and she agrees to proceed.       Justin Mend, PA-C [facility] [address] [place], [place] [address] [phone] 05/20/2023 3:16 PM

## 2023-05-20 NOTE — Patient Instructions (Addendum)
Cardioversion scheduled for:  Monday, October 28th   - Arrive at the [facility] and go to admitting at 930am   - Do not eat or drink anything after midnight the night prior to your procedure.   - Take all your morning medication (except diabetic medications) with a sip of water prior to arrival.  - You will not be able to drive home after your procedure.    - Do NOT miss any doses of your blood thinner - if you should miss a dose please notify our office immediately.   - If you feel as if you go back into normal rhythm prior to scheduled cardioversion, please notify our office immediately.   If your procedure is canceled in the cardioversion suite you will be charged a cancellation fee.

## 2023-05-20 NOTE — Telephone Encounter (Signed)
Rescheduled DCCV to 10/28. She will see the AFib clinic this afternoon for H&P Aware DCCV instructions will be available via mychart. Patient verbalized understanding and agreeable to plan.

## 2023-05-20 NOTE — H&P (View-Only) (Signed)
Primary Care Physician: Shelia Bookman, NP Primary Cardiologist: Dr Shelia [name] Primary Electrophysiologist: Dr Shelia [name] Referring Physician: Ulyses [name] triage    Shelia [name] is a 82 y.o. female with a history of HLD, CAD, atrial fibrillation who presents for follow up in the [facility].  The patient was initially diagnosed with atrial fibrillation early 2021 and underwent DCCV on 11/17/19. She had recurrent symptoms of persistent afib and had repeat DCCV on 04/16/21. Patient is on Eliquis for a CHADS2VASC score of 4. She presented to urgent care 07/24/21 with palpitations and mild SOB. ECG showed rate controlled afib. There were no specific triggers that she could identify. Patient is s/p DCCV on 08/15/21.   Patient is s/p afib ablation with Dr Shelia [name] on 06/10/22.   On follow up today, patient reports that about one week ago she noticed more fatigue and SOB with exertion. She has also had some intermittent dizziness. She checked her Shelia [name] mobile two days ago which showed afib. There were no specific triggers that she could identify. She did miss a dose of Eliquis but is sure she has not missed any since 8/27.  On follow up 05/20/23, she is currently in Afib. She feels SOB with exertion when in Afib. She missed a dose of Eliquis on 9/17 and DCCV was rescheduled to 10/28. She has not missed any more doses of Eliquis since then.    Today, she denies symptoms of palpitations, chest pain, orthopnea, PND, lower extremity edema, presyncope, syncope, snoring, daytime somnolence, bleeding, or neurologic sequela. The patient is tolerating medications without difficulties and is otherwise without complaint today.    Atrial Fibrillation Risk Factors:  she does not have symptoms or diagnosis of sleep apnea. she does not have a history of rheumatic fever. she does not have a history of alcohol use.   Atrial Fibrillation Management history:  Previous antiarrhythmic  drugs: none Previous cardioversions: 11/17/19, 04/16/21 Previous ablations: 06/10/22 Anticoagulation history: Eliquis   Past Medical History:  Diagnosis Date   Anxiety    Arthritis    "left knee; left hip; lower back; mild to moderate in right hip" (09/30/2016)   Atrial fibrillation (HCC)    Chronic lower back pain    Gastritis    Heart palpitations    History of bone density study 2018   History of mammogram 2021   History of MRI 2018   History of Papanicolaou smear of cervix    Hypercholesteremia    Osteopenia    Seasonal allergies     Current Outpatient Medications  Medication Sig Dispense Refill   acetaminophen (TYLENOL) 325 MG tablet Take 325 mg by mouth every 6 (six) hours as needed for moderate pain or mild pain.     amoxicillin (AMOXIL) 500 MG capsule Take 4 capsules (2,000 mg total) by mouth 1 hour prior to appointment. 4 capsule 0   apixaban (ELIQUIS) 5 MG TABS tablet Take 1 tablet (5 mg total) by mouth 2 (two) times daily. 60 tablet 5   Brimonidine Tartrate (LUMIFY) 0.025 % SOLN Place 1 drop into both eyes daily. Red eyes     Carboxymeth-Glyc-Polysorb PF (REFRESH OPTIVE MEGA-3) 0.5-1-0.5 % SOLN Place 1 drop into both eyes 4 (four) times daily as needed (dry eyes). Enhance with flax seed oil     hydrocortisone cream 0.5 % Apply 1 Application topically 2 (two) times daily as needed for itching.     ketotifen (ZADITOR) 0.025 % ophthalmic solution Place 1 drop into both eyes  2 (two) times daily as needed (itchy eyes). In the fall     loratadine (CLARITIN) 10 MG tablet Take 10 mg by mouth daily as needed for allergies.     mineral oil-hydrophilic petrolatum (AQUAPHOR) ointment Apply 1 Application topically as needed for dry skin.     Multiple Vitamin (ONE-DAILY MULTI VITAMINS PO) Take 30 mLs by mouth daily. Shelia [name] liquid vitamin     NON FORMULARY Take 1,000 mg by mouth daily. Plant calcium     OVER THE COUNTER MEDICATION Place 1 application  into both eyes at bedtime. Pure &  Clean     rosuvastatin (CRESTOR) 10 MG tablet Take 1 tablet (10 mg total) by mouth daily. 90 tablet 3   triamcinolone cream (KENALOG) 0.1 % Apply 1 Application topically 2 (two) times daily as needed (itching).     No current facility-administered medications for this encounter.    ROS- All systems are reviewed and negative except as per the HPI above.  Physical Exam: Vitals:   05/20/23 1501  BP: 106/80  Pulse: 91  Weight: 62.5 kg  Height: 5\' 7"  (1.702 m)    GEN- The patient is well appearing, alert and oriented x 3 today.   Neck - no JVD or carotid bruit noted Lungs- Clear to ausculation bilaterally, normal work of breathing Heart- Irregular rate and rhythm, no murmurs, rubs or gallops, PMI not laterally displaced Extremities- no clubbing, cyanosis, or edema Skin - no rash or ecchymosis noted   Wt Readings from Last 3 Encounters:  05/20/23 62.5 kg  05/01/23 62.2 kg  04/21/23 62.1 kg    ECG today demonstrates  Vent. rate 91 BPM PR interval * ms QRS duration 78 ms QT/QTcB 352/432 ms P-R-T axes * -38 37 Atrial fibrillation Left axis deviation Low voltage QRS Abnormal ECG When compared with ECG of 21-Apr-2023 12:12, PREVIOUS ECG IS PRESENT  Echo 10/31/19 demonstrated   1. Left ventricular ejection fraction, by estimation, is 60 to 65%. The  left ventricle has normal function. The left ventricle has no regional  wall motion abnormalities. Left ventricular diastolic parameters were  normal. The average left ventricular global longitudinal strain is -22.9 %. The global longitudinal strain is normal.   2. Right ventricular systolic function is normal. The right ventricular  size is normal. There is mildly elevated pulmonary artery systolic  pressure.   3. Left atrial size was moderately dilated.   4. Right atrial size was moderately dilated.   5. The mitral valve is normal in structure. Mild mitral valve  regurgitation. No evidence of mitral stenosis.   6. Tricuspid  valve regurgitation is mild to moderate.   7. The aortic valve is tricuspid. Aortic valve regurgitation is mild. No  aortic stenosis is present.   8. Pulmonic valve regurgitation is moderate.   9. The inferior vena cava is dilated in size with <50% respiratory  variability, suggesting right atrial pressure of 15 mmHg.   Comparison(s): No significant change from prior study. 10/31/19 EF 60-65%.   Conclusion(s)/Recommendation(s): Otherwise normal echocardiogram, with  minor abnormalities described in the report.   Epic records are reviewed at length today  CHA2DS2-VASc Score = 3  The patient's score is based upon: CHF History: 0 HTN History: 0 Diabetes History: 0 Stroke History: 0 Vascular Disease History: 0 Age Score: 2 Gender Score: 1       ASSESSMENT AND PLAN: Persistent Atrial Fibrillation (ICD10:  I48.19) The patient's CHA2DS2-VASc score is 3, indicating a 3.2% annual risk of stroke.  S/p afib ablation 06/10/22.  Patient is currently in persistent Afib. She missed a dose of Eliquis on 9/17 so DCCV was rescheduled. We will still proceed as previously planned for DCCV. If she has quick return of her afib, may need to consider AAD or repeat ablation.  Continue Eliquis 5 mg BID.  Kardia mobile for home monitoring.  Labs today.  Secondary Hypercoagulable State (ICD10:  D68.69) The patient is at significant risk for stroke/thromboembolism based upon her CHA2DS2-VASc Score of 3.  Continue Apixaban (Eliquis).   CAD Coronary calcium score 192 (63rd percentile) FFR negative for significant stenosis No anginal symptoms   Follow up in the AF clinic post DCCV.    Informed Consent   Shared Decision Making/Informed Consent The risks (stroke, cardiac arrhythmias rarely resulting in the need for a temporary or permanent pacemaker, skin irritation or burns and complications associated with conscious sedation including aspiration, arrhythmia, respiratory failure and death), benefits  (restoration of normal sinus rhythm) and alternatives of a direct current cardioversion were explained in detail to Ms. Plazola and she agrees to proceed.       Justin Mend, PA-C Af[facility] Clinic [facility] [address] [place], [place] [address] [phone] 05/20/2023 3:16 PM

## 2023-05-21 DIAGNOSIS — D320 Benign neoplasm of cerebral meninges: Secondary | ICD-10-CM | POA: Diagnosis not present

## 2023-05-21 DIAGNOSIS — F1021 Alcohol dependence, in remission: Secondary | ICD-10-CM | POA: Diagnosis not present

## 2023-05-21 DIAGNOSIS — J309 Allergic rhinitis, unspecified: Secondary | ICD-10-CM | POA: Diagnosis not present

## 2023-05-21 DIAGNOSIS — I2510 Atherosclerotic heart disease of native coronary artery without angina pectoris: Secondary | ICD-10-CM | POA: Diagnosis not present

## 2023-05-21 DIAGNOSIS — I700 Atherosclerosis of aorta: Secondary | ICD-10-CM | POA: Diagnosis not present

## 2023-05-21 DIAGNOSIS — D6869 Other thrombophilia: Secondary | ICD-10-CM | POA: Diagnosis not present

## 2023-05-21 DIAGNOSIS — G8929 Other chronic pain: Secondary | ICD-10-CM | POA: Diagnosis not present

## 2023-05-21 DIAGNOSIS — M8580 Other specified disorders of bone density and structure, unspecified site: Secondary | ICD-10-CM | POA: Diagnosis not present

## 2023-05-21 DIAGNOSIS — I4891 Unspecified atrial fibrillation: Secondary | ICD-10-CM | POA: Diagnosis not present

## 2023-05-21 DIAGNOSIS — H04129 Dry eye syndrome of unspecified lacrimal gland: Secondary | ICD-10-CM | POA: Diagnosis not present

## 2023-05-21 DIAGNOSIS — G319 Degenerative disease of nervous system, unspecified: Secondary | ICD-10-CM | POA: Diagnosis not present

## 2023-05-21 DIAGNOSIS — E785 Hyperlipidemia, unspecified: Secondary | ICD-10-CM | POA: Diagnosis not present

## 2023-05-22 NOTE — Progress Notes (Signed)
T-score negative for osteoporosis.

## 2023-05-22 NOTE — OR Nursing (Signed)
Called patient with pre-procedure instructions for tomorrow.   Patient informed of:   Time to arrive for procedure. 1000 Remain NPO past midnight.  Must have a ride home and a responsible adult to remain with them for 24 hours post procedure.  Confirmed blood thinner. Confirmed no breaks in taking blood thinner for 3+ weeks prior to procedure. Confirmed patient stopped all GLP-1s and GLP-2s for at least one week before procedure.   Left a message for patient regarding above information. Instructed to call back with any questions.

## 2023-05-25 ENCOUNTER — Encounter (HOSPITAL_COMMUNITY)
Admission: RE | Disposition: A | Discharge status: Home / Self Care | Payer: Self-pay | Source: Home / Self Care | Attending: Internal Medicine

## 2023-05-25 ENCOUNTER — Ambulatory Visit (HOSPITAL_COMMUNITY): Payer: Self-pay | Admitting: Anesthesiology

## 2023-05-25 ENCOUNTER — Other Ambulatory Visit: Payer: Self-pay

## 2023-05-25 ENCOUNTER — Other Ambulatory Visit (HOSPITAL_COMMUNITY): Payer: Self-pay

## 2023-05-25 ENCOUNTER — Ambulatory Visit (HOSPITAL_BASED_OUTPATIENT_CLINIC_OR_DEPARTMENT_OTHER): Payer: Self-pay | Admitting: Anesthesiology

## 2023-05-25 ENCOUNTER — Ambulatory Visit: Payer: PPO

## 2023-05-25 ENCOUNTER — Encounter (HOSPITAL_COMMUNITY): Payer: Self-pay | Admitting: Internal Medicine

## 2023-05-25 ENCOUNTER — Ambulatory Visit (HOSPITAL_COMMUNITY)
Admission: RE | Admit: 2023-05-25 | Discharge: 2023-05-25 | Disposition: A | Discharge status: Home / Self Care | Payer: PPO | Attending: Internal Medicine | Admitting: Internal Medicine

## 2023-05-25 DIAGNOSIS — I4891 Unspecified atrial fibrillation: Secondary | ICD-10-CM | POA: Diagnosis not present

## 2023-05-25 DIAGNOSIS — D6869 Other thrombophilia: Secondary | ICD-10-CM | POA: Insufficient documentation

## 2023-05-25 DIAGNOSIS — I2510 Atherosclerotic heart disease of native coronary artery without angina pectoris: Secondary | ICD-10-CM | POA: Diagnosis not present

## 2023-05-25 DIAGNOSIS — Z7901 Long term (current) use of anticoagulants: Secondary | ICD-10-CM | POA: Insufficient documentation

## 2023-05-25 DIAGNOSIS — I4819 Other persistent atrial fibrillation: Secondary | ICD-10-CM | POA: Diagnosis not present

## 2023-05-25 HISTORY — PX: CARDIOVERSION: SHX1299

## 2023-05-25 SURGERY — CARDIOVERSION
Anesthesia: General

## 2023-05-25 MED ORDER — LIDOCAINE 2% (20 MG/ML) 5 ML SYRINGE
INTRAMUSCULAR | Status: DC | PRN
  Administered 2023-05-25: 60 mg via INTRAVENOUS

## 2023-05-25 MED ORDER — PROPOFOL 10 MG/ML IV BOLUS
INTRAVENOUS | Status: DC | PRN
  Administered 2023-05-25: 50 mg via INTRAVENOUS

## 2023-05-25 MED ORDER — SODIUM CHLORIDE 0.9 % IV SOLN: INTRAVENOUS | Status: DC

## 2023-05-25 SURGICAL SUPPLY — 1 items: PAD DEFIB RADIO PHYSIO CONN (PAD) ×2 IMPLANT

## 2023-05-25 NOTE — Interval H&P Note (Signed)
History and Physical Interval Note:  05/25/2023 12:17 PM  Shelia [name]  has presented today for surgery, with the diagnosis of AFIB.  The various methods of treatment have been discussed with the patient and family. After consideration of risks, benefits and other options for treatment, the patient has consented to  Procedure(s): CARDIOVERSION (N/A) as a surgical intervention.  The patient's history has been reviewed, patient examined, no change in status, stable for surgery.  I have reviewed the patient's chart and labs.  Questions were answered to the patient's satisfaction.     Chrystie Nose

## 2023-05-25 NOTE — Transfer of Care (Signed)
Immediate Anesthesia Transfer of Care Note  Patient: Shelia [name]  Procedure(s) Performed: CARDIOVERSION  Patient Location: Cath Lab  Anesthesia Type:MAC  Level of Consciousness: drowsy  Airway & Oxygen Therapy: Patient Spontanous Breathing and Patient connected to nasal cannula oxygen  Post-op Assessment: Report given to RN and Post -op Vital signs reviewed and stable  Post vital signs: Reviewed and stable  Last Vitals:  Vitals Value Taken Time  BP 109/69 05/25/23 1231  Temp    Pulse 111 05/25/23 1231  Resp 21 05/25/23 1231  SpO2 100% 05/25/23 1231  Vitals shown include unfiled device data.  Last Pain:  Vitals:   05/25/23 1026  TempSrc:   PainSc: 0-No pain         Complications: No notable events documented.

## 2023-05-25 NOTE — Anesthesia Postprocedure Evaluation (Signed)
Anesthesia Post Note  Patient: LATERIA PLAUT  Procedure(s) Performed: CARDIOVERSION     Patient location during evaluation: PACU Anesthesia Type: General Level of consciousness: awake and alert and oriented Pain management: pain level controlled Vital Signs Assessment: post-procedure vital signs reviewed and stable Respiratory status: spontaneous breathing, nonlabored ventilation and respiratory function stable Cardiovascular status: blood pressure returned to baseline and stable Postop Assessment: no apparent nausea or vomiting Anesthetic complications: no   No notable events documented.  Last Vitals:  Vitals:   05/25/23 1026 05/25/23 1235  BP:    Pulse: 82   Resp: 13   Temp:  36.6 C  SpO2: 96%     Last Pain:  Vitals:   05/25/23 1235  TempSrc: Temporal  PainSc: 0-No pain                 Agripina Guyette A.

## 2023-05-25 NOTE — CV Procedure (Signed)
    CARDIOVERSION NOTE  Procedure: Electrical Cardioversion Indications:  Atrial Fibrillation  Procedure Details:  Consent: Risks of procedure as well as the alternatives and risks of each were explained to the (patient/caregiver).  Consent for procedure obtained.  Time Out: Verified patient identification, verified procedure, site/side was marked, verified correct patient position, special equipment/implants available, medications/allergies/relevent history reviewed, required imaging and test results available.  Performed  Patient placed on cardiac monitor, pulse oximetry, supplemental oxygen as necessary.  Sedation given:  propofol per anesthesia Pacer pads placed anterior and posterior chest.  Cardioverted 1 time(s).  Cardioverted at 200J biphasic.  Impression: Findings: Post procedure EKG shows:  Sinus bradycardia Complications: None Patient did tolerate procedure well.  Plan: Successful DCCV with a single 200J biphasic shock to sinus bradycardia. Long conversion pause noted.  Time Spent Directly with the Patient:  30 minutes   Chrystie Nose, MD, [facility], FACP  [place]  [facility]  Medical Director of the Advanced Lipid Disorders &  Cardiovascular Risk Reduction Clinic Diplomate of the American Board of Clinical Lipidology Attending Cardiologist  Direct Dial: [phone]  Fax: [phone]  Website:  [email or web address] Nicely Jhoana Upham 05/25/2023, 12:30 PM

## 2023-05-25 NOTE — Anesthesia Procedure Notes (Signed)
Procedure Name: MAC Date/Time: 05/25/2023 12:25 PM  Performed by: Waynard Edwards, CRNAPre-anesthesia Checklist: Patient identified, Emergency Drugs available, Suction available, Patient being monitored and Timeout performed Patient Re-evaluated:Patient Re-evaluated prior to induction Oxygen Delivery Method: Nasal cannula Induction Type: IV induction Placement Confirmation: positive ETCO2 Dental Injury: Teeth and Oropharynx as per pre-operative assessment

## 2023-05-25 NOTE — Discharge Instructions (Signed)

## 2023-05-25 NOTE — Anesthesia Preprocedure Evaluation (Addendum)
Anesthesia Evaluation  Patient identified by MRN, date of birth, ID band Patient awake    Reviewed: Allergy & Precautions, NPO status , Patient's Chart, lab work & pertinent test results, reviewed documented beta blocker date and time   History of Anesthesia Complications Negative for: history of anesthetic complications  Airway Mallampati: II  TM Distance: >3 FB Neck ROM: Full    Dental no notable dental hx. (+) Teeth Intact, Dental Advisory Given   Pulmonary neg COPD, former smoker   breath sounds clear to auscultation       Cardiovascular (-) hypertension(-) CAD, (-) Past MI, (-) Cardiac Stents, (-) CABG and (-) CHF + dysrhythmias Atrial Fibrillation  Rhythm:Irregular Rate:Normal  Echo 08/27/21  1. Left ventricular ejection fraction, by estimation, is 60 to 65%. The  left ventricle has normal function. The left ventricle has no regional  wall motion abnormalities. Left ventricular diastolic parameters were  normal. The average left ventricular  global longitudinal strain is -22.9 %. The global longitudinal strain is  normal.   2. Right ventricular systolic function is normal. The right ventricular  size is normal. There is mildly elevated pulmonary artery systolic  pressure.   3. Left atrial size was moderately dilated.   4. Right atrial size was moderately dilated.   5. The mitral valve is normal in structure. Mild mitral valve  regurgitation. No evidence of mitral stenosis.   6. Tricuspid valve regurgitation is mild to moderate.   7. The aortic valve is tricuspid. Aortic valve regurgitation is mild. No  aortic stenosis is present.   8. Pulmonic valve regurgitation is moderate.   9. The inferior vena cava is dilated in size with <50% respiratory  variability, suggesting right atrial pressure of 15 mmHg.   EKG 05/20/23 Atrial fibrillation, LAD    Neuro/Psych   Anxiety      Neuromuscular disease    GI/Hepatic ,neg GERD   ,,(+) neg Cirrhosis        Endo/Other    Renal/GU Renal disease  negative genitourinary   Musculoskeletal  (+) Arthritis ,    Abdominal   Peds  Hematology Eliquis therapy- last dose this am Thrombocytopenia- mild 141k   Anesthesia Other Findings   Reproductive/Obstetrics                              Anesthesia Physical Anesthesia Plan  ASA: 3  Anesthesia Plan: General   Post-op Pain Management:    Induction:   PONV Risk Score and Plan: 3 and Ondansetron  Airway Management Planned: Natural Airway and Simple Face Mask  Additional Equipment:   Intra-op Plan:   Post-operative Plan:   Informed Consent: I have reviewed the patients History and Physical, chart, labs and discussed the procedure including the risks, benefits and alternatives for the proposed anesthesia with the patient or authorized representative who has indicated his/her understanding and acceptance.     Dental advisory given  Plan Discussed with: CRNA  Anesthesia Plan Comments:          Anesthesia Quick Evaluation

## 2023-05-26 ENCOUNTER — Ambulatory Visit (INDEPENDENT_AMBULATORY_CARE_PROVIDER_SITE_OTHER): Payer: PPO

## 2023-05-26 ENCOUNTER — Encounter (HOSPITAL_COMMUNITY): Payer: Self-pay | Admitting: Internal Medicine

## 2023-05-26 DIAGNOSIS — Z23 Encounter for immunization: Secondary | ICD-10-CM

## 2023-05-27 ENCOUNTER — Telehealth (HOSPITAL_COMMUNITY): Payer: Self-pay | Admitting: *Deleted

## 2023-05-27 ENCOUNTER — Other Ambulatory Visit (HOSPITAL_COMMUNITY): Payer: Self-pay

## 2023-05-27 MED ORDER — DILTIAZEM HCL 30 MG PO TABS
ORAL_TABLET | ORAL | 1 refills | Status: AC
  Filled 2023-05-27: qty 30, 30d supply, fill #0

## 2023-05-27 NOTE — Telephone Encounter (Signed)
Patient called in stating she went back into afib yesterday evening according to her kardia device. HR running 90-115. Can feel her heart pounding but otherwise feeling ok. Discussed with Jorja Loa PA will call in PRN cardizem to use for elevated rates until seen in office later this week. Pt in agreement.

## 2023-05-29 ENCOUNTER — Ambulatory Visit (HOSPITAL_COMMUNITY)
Admit: 2023-05-29 | Discharge: 2023-05-29 | Disposition: A | Discharge status: Home / Self Care | Payer: PPO | Attending: Internal Medicine | Admitting: Internal Medicine

## 2023-05-29 VITALS — BP 94/64 | HR 53 | Ht 67.0 in | Wt 139.2 lb

## 2023-05-29 DIAGNOSIS — I4891 Unspecified atrial fibrillation: Secondary | ICD-10-CM | POA: Diagnosis not present

## 2023-05-29 DIAGNOSIS — I2510 Atherosclerotic heart disease of native coronary artery without angina pectoris: Secondary | ICD-10-CM | POA: Insufficient documentation

## 2023-05-29 DIAGNOSIS — I4819 Other persistent atrial fibrillation: Secondary | ICD-10-CM

## 2023-05-29 DIAGNOSIS — D6869 Other thrombophilia: Secondary | ICD-10-CM | POA: Insufficient documentation

## 2023-05-29 DIAGNOSIS — Z7901 Long term (current) use of anticoagulants: Secondary | ICD-10-CM | POA: Insufficient documentation

## 2023-05-29 NOTE — Progress Notes (Addendum)
Primary Care Physician: Caesar Bookman, NP Primary Cardiologist: Dr Jens Som Primary Electrophysiologist: Dr Elberta Fortis Referring Physician: Ulyses Southward triage    Shelia [name] is a 82 y.o. female with a history of HLD, CAD, atrial fibrillation who presents for follow up in the [facility].  The patient was initially diagnosed with atrial fibrillation early 2021 and underwent DCCV on 11/17/19. She had recurrent symptoms of persistent afib and had repeat DCCV on 04/16/21. Patient is on Eliquis for a CHADS2VASC score of 4. She presented to urgent care 07/24/21 with palpitations and mild SOB. ECG showed rate controlled afib. There were no specific triggers that she could identify. Patient is s/p DCCV on 08/15/21.   Patient is s/p afib ablation with Dr Elberta Fortis on 06/10/22.   On follow up today, patient reports that about one week ago she noticed more fatigue and SOB with exertion. She has also had some intermittent dizziness. She checked her Lourena Simmonds mobile two days ago which showed afib. There were no specific triggers that she could identify. She did miss a dose of Eliquis but is sure she has not missed any since 8/27.  On follow up 05/20/23, she is currently in Afib. She feels SOB with exertion when in Afib. She missed a dose of Eliquis on 9/17 and DCCV was rescheduled to 10/28. She has not missed any more doses of Eliquis since then.    On follow up 05/29/23, she is currently in NSR. S/p successful DCCV on 05/25/23. Unfortunately, patient contacted clinic on 10/30 noting she went back into Afib and was prescribed PRN diltiazem. She did convert to normal rhythm after taking one pill of PRN diltiazem.   Today, she denies symptoms of palpitations, chest pain, orthopnea, PND, lower extremity edema, presyncope, syncope, snoring, daytime somnolence, bleeding, or neurologic sequela. The patient is tolerating medications without difficulties and is otherwise without complaint  today.    Atrial Fibrillation Risk Factors:  she does not have symptoms or diagnosis of sleep apnea. she does not have a history of rheumatic fever. she does not have a history of alcohol use.   Atrial Fibrillation Management history:  Previous antiarrhythmic drugs: none Previous cardioversions: 11/17/19, 04/16/21, 05/25/23 Previous ablations: 06/10/22 Anticoagulation history: Eliquis   Past Medical History:  Diagnosis Date   Anxiety    Arthritis    "left knee; left hip; lower back; mild to moderate in right hip" (09/30/2016)   Atrial fibrillation (HCC)    Chronic lower back pain    Gastritis    Heart palpitations    History of bone density study 2018   History of mammogram 2021   History of MRI 2018   History of Papanicolaou smear of cervix    Hypercholesteremia    Osteopenia    Seasonal allergies     Current Outpatient Medications  Medication Sig Dispense Refill   acetaminophen (TYLENOL) 325 MG tablet Take 325 mg by mouth every 6 (six) hours as needed for moderate pain or mild pain.     amoxicillin (AMOXIL) 500 MG capsule Take 4 capsules (2,000 mg total) by mouth 1 hour prior to appointment. 4 capsule 0   apixaban (ELIQUIS) 5 MG TABS tablet Take 1 tablet (5 mg total) by mouth 2 (two) times daily. 60 tablet 5   Brimonidine Tartrate (LUMIFY) 0.025 % SOLN Place 1 drop into both eyes daily. Red eyes     Carboxymeth-Glyc-Polysorb PF (REFRESH OPTIVE MEGA-3) 0.5-1-0.5 % SOLN Place 1 drop into both eyes 4 (four)  times daily as needed (dry eyes). Enhance with flax seed oil     diltiazem (CARDIZEM) 30 MG tablet Take 1 tablet every 4 hours AS NEEDED for AFIB heart rate >100 as long as top BP >100. 30 tablet 1   hydrocortisone cream 0.5 % Apply 1 Application topically 2 (two) times daily as needed for itching.     ketotifen (ZADITOR) 0.025 % ophthalmic solution Place 1 drop into both eyes 2 (two) times daily as needed (itchy eyes). In the fall     loratadine (CLARITIN) 10 MG tablet  Take 10 mg by mouth daily as needed for allergies.     mineral oil-hydrophilic petrolatum (AQUAPHOR) ointment Apply 1 Application topically as needed for dry skin.     Multiple Vitamin (ONE-DAILY MULTI VITAMINS PO) Take 30 mLs by mouth daily. [name] liquid vitamin     NON FORMULARY Take 1,000 mg by mouth daily. Plant calcium     OVER THE COUNTER MEDICATION Place 1 application  into both eyes at bedtime. Pure & Clean     rosuvastatin (CRESTOR) 10 MG tablet Take 1 tablet (10 mg total) by mouth daily. 90 tablet 3   triamcinolone cream (KENALOG) 0.1 % Apply 1 Application topically 2 (two) times daily as needed (itching).     No current facility-administered medications for this encounter.    ROS- All systems are reviewed and negative except as per the HPI above.  Physical Exam: Vitals:   05/29/23 1400  BP: 94/64  Pulse: (!) 53  Weight: 63.1 kg  Height: 5\' 7"  (1.702 m)    GEN- The patient is well appearing, alert and oriented x 3 today.   Neck - no JVD or carotid bruit noted Lungs- Clear to ausculation bilaterally, normal work of breathing Heart- Regular bradycardic rate and rhythm, no murmurs, rubs or gallops, PMI not laterally displaced Extremities- no clubbing, cyanosis, or edema Skin - no rash or ecchymosis noted   Wt Readings from Last 3 Encounters:  05/29/23 63.1 kg  05/25/23 61.2 kg  05/20/23 62.5 kg    ECG today demonstrates  Vent. rate 53 BPM PR interval 150 ms QRS duration 84 ms QT/QTcB 444/416 ms P-R-T axes 86 -59 57 Sinus bradycardia with marked sinus arrhythmia Left axis deviation Pulmonary disease pattern Abnormal ECG When compared with ECG of 25-May-2023 12:36, PREVIOUS ECG IS PRESENT  Echo 10/31/19 demonstrated   1. Left ventricular ejection fraction, by estimation, is 60 to 65%. The  left ventricle has normal function. The left ventricle has no regional  wall motion abnormalities. Left ventricular diastolic parameters were  normal. The average left  ventricular global longitudinal strain is -22.9 %. The global longitudinal strain is normal.   2. Right ventricular systolic function is normal. The right ventricular  size is normal. There is mildly elevated pulmonary artery systolic  pressure.   3. Left atrial size was moderately dilated.   4. Right atrial size was moderately dilated.   5. The mitral valve is normal in structure. Mild mitral valve  regurgitation. No evidence of mitral stenosis.   6. Tricuspid valve regurgitation is mild to moderate.   7. The aortic valve is tricuspid. Aortic valve regurgitation is mild. No  aortic stenosis is present.   8. Pulmonic valve regurgitation is moderate.   9. The inferior vena cava is dilated in size with <50% respiratory  variability, suggesting right atrial pressure of 15 mmHg.   Comparison(s): No significant change from prior study. 10/31/19 EF 60-65%.   Conclusion(s)/Recommendation(s):  Otherwise normal echocardiogram, with  minor abnormalities described in the report.   Epic records are reviewed at length today  CHA2DS2-VASc Score = 3  The patient's score is based upon: CHF History: 0 HTN History: 0 Diabetes History: 0 Stroke History: 0 Vascular Disease History: 0 Age Score: 2 Gender Score: 1       ASSESSMENT AND PLAN: Persistent Atrial Fibrillation (ICD10:  I48.19) The patient's CHA2DS2-VASc score is 3, indicating a 3.2% annual risk of stroke.   S/p afib ablation 06/10/22. S/p successful DCCV on 05/25/23 with long conversion pause noted.  She is currently in NSR.  Due to early return of Afib, we discussed rhythm control options should she wish to pursue treatment. Medication options include Multaq, lowest dosage of Tikosyn, or amiodarone. I would have to seek approval from Dr. Elberta Fortis if flecainide is a safe option given elevated calcium score. Procedural options include repeat ablation. After discussion, she wishes to consider her options and call us if she wishes to proceed  with medication therapy. She seems inclined to monitor burden since she is back in normal rhythm.    Secondary Hypercoagulable State (ICD10:  D68.69) The patient is at significant risk for stroke/thromboembolism based upon her CHA2DS2-VASc Score of 3.  Continue Apixaban (Eliquis).   CAD Coronary calcium score 140 (55th percentile) 192 previously (63rd percentile) FFR negative for significant stenosis No anginal symptoms   Follow up as scheduled with Dr. Elberta Fortis.   Justin Mend, PA-C [facility] [address] [place], [place] [address] [phone] 05/29/2023 3:11 PM

## 2023-05-29 NOTE — Patient Instructions (Signed)
Medication options for rhythm control include:  Multaq   Amiodarone, but would be very reluctant due to your low resting heart rate  Tikosyn, but you would qualify for lowest dosage  Flecainide would have to be approved by Dr. Elberta Fortis, due to your elevate coronary calcium score > 100.  We always recommend check pricing with your insurance company on medications.  Procedure option would include repeat ablation.

## 2023-06-08 ENCOUNTER — Ambulatory Visit (HOSPITAL_COMMUNITY): Payer: PPO | Admitting: Physician Assistant

## 2023-06-15 ENCOUNTER — Other Ambulatory Visit: Payer: PPO

## 2023-06-18 ENCOUNTER — Ambulatory Visit: Payer: PPO | Admitting: Family

## 2023-06-20 ENCOUNTER — Other Ambulatory Visit (HOSPITAL_COMMUNITY): Payer: Self-pay

## 2023-06-20 ENCOUNTER — Other Ambulatory Visit: Payer: Self-pay | Admitting: Cardiology

## 2023-06-20 DIAGNOSIS — I4819 Other persistent atrial fibrillation: Secondary | ICD-10-CM

## 2023-06-22 ENCOUNTER — Other Ambulatory Visit (HOSPITAL_COMMUNITY): Payer: Self-pay

## 2023-06-22 MED ORDER — APIXABAN 5 MG PO TABS
5.0000 mg | ORAL_TABLET | Freq: Two times a day (BID) | ORAL | 5 refills | Status: DC
  Filled 2023-06-22: qty 60, 30d supply, fill #0
  Filled 2023-07-21: qty 60, 30d supply, fill #1
  Filled 2023-08-17 – 2023-08-28 (×2): qty 60, 30d supply, fill #2
  Filled 2023-09-21 – 2023-10-30 (×5): qty 60, 30d supply, fill #3
  Filled 2023-11-25: qty 60, 30d supply, fill #4
  Filled 2023-12-25: qty 60, 30d supply, fill #5

## 2023-06-22 NOTE — Telephone Encounter (Signed)
Eliquis 5mg  refill request received. Patient is 82 years old, weight-63.1kg, Crea-0.84 on 05/20/23, Diagnosis-Afib, and last seen by [place] on 05/29/23. Dose is appropriate based on dosing criteria.

## 2023-07-18 ENCOUNTER — Other Ambulatory Visit (HOSPITAL_COMMUNITY): Payer: Self-pay

## 2023-07-18 DIAGNOSIS — N3001 Acute cystitis with hematuria: Secondary | ICD-10-CM | POA: Diagnosis not present

## 2023-07-18 DIAGNOSIS — R300 Dysuria: Secondary | ICD-10-CM | POA: Diagnosis not present

## 2023-07-18 MED ORDER — CEPHALEXIN 500 MG PO CAPS
500.0000 mg | ORAL_CAPSULE | Freq: Two times a day (BID) | ORAL | 0 refills | Status: DC
  Filled 2023-07-18: qty 14, 7d supply, fill #0

## 2023-07-21 ENCOUNTER — Other Ambulatory Visit: Payer: Self-pay

## 2023-07-24 DIAGNOSIS — S9031XA Contusion of right foot, initial encounter: Secondary | ICD-10-CM | POA: Diagnosis not present

## 2023-07-24 DIAGNOSIS — L565 Disseminated superficial actinic porokeratosis (DSAP): Secondary | ICD-10-CM | POA: Diagnosis not present

## 2023-07-24 DIAGNOSIS — M2041 Other hammer toe(s) (acquired), right foot: Secondary | ICD-10-CM | POA: Diagnosis not present

## 2023-07-24 DIAGNOSIS — M21612 Bunion of left foot: Secondary | ICD-10-CM | POA: Diagnosis not present

## 2023-07-24 DIAGNOSIS — D2371 Other benign neoplasm of skin of right lower limb, including hip: Secondary | ICD-10-CM | POA: Diagnosis not present

## 2023-07-24 DIAGNOSIS — Q828 Other specified congenital malformations of skin: Secondary | ICD-10-CM | POA: Diagnosis not present

## 2023-07-24 DIAGNOSIS — M21611 Bunion of right foot: Secondary | ICD-10-CM | POA: Diagnosis not present

## 2023-07-27 ENCOUNTER — Ambulatory Visit (INDEPENDENT_AMBULATORY_CARE_PROVIDER_SITE_OTHER): Payer: PPO | Admitting: Family

## 2023-07-27 ENCOUNTER — Encounter: Payer: Self-pay | Admitting: Family

## 2023-07-27 VITALS — BP 128/84 | HR 60 | Temp 97.2°F | Resp 18 | Ht 61.0 in | Wt 141.0 lb

## 2023-07-27 DIAGNOSIS — R300 Dysuria: Secondary | ICD-10-CM

## 2023-07-27 DIAGNOSIS — R600 Localized edema: Secondary | ICD-10-CM

## 2023-07-27 LAB — POCT URINALYSIS DIPSTICK
Bilirubin, UA: NEGATIVE
Glucose, UA: NEGATIVE
Ketones, UA: NEGATIVE
Protein, UA: NEGATIVE
Spec Grav, UA: 1.015 (ref 1.010–1.025)
Urobilinogen, UA: 1 U/dL
pH, UA: 7 (ref 5.0–8.0)

## 2023-07-27 NOTE — Progress Notes (Signed)
Provider: Richarda Blade FNP-C  Murtaza Shell, [name], NP  Patient Care Team: Shadiyah Wernli, [name], NP as PCP - General (Family Medicine) Jens Som Madolyn Frieze, MD as PCP - Cardiology (Cardiology) Regan Lemming, MD as PCP - Electrophysiology (Cardiology) Richarda Overlie, MD as Consulting Physician (Obstetrics and Gynecology) Jens Som Madolyn Frieze, MD as Consulting Physician (Cardiology) Rodrigo Ran, [place] (Ophthalmology)  Extended Emergency Contact Information Primary Emergency Contact: Totman,Tom Address: [phone]          [place] [address] [place] Phone: [phone] Relation: Spouse Secondary Emergency Contact: Rodrigo Ran Address: [address]          [place], [place] [address] [place] Mobile Phone: [phone] Relation: Daughter  Code Status:  Full Code  Goals of care: Advanced Directive information    07/27/2023    9:33 AM  Advanced Directives  Does Patient Have a Medical Advance Directive? Yes  Type of Estate agent of [place];Living will  Does patient want to make changes to medical advance directive? No - Patient declined  Copy of Healthcare Power of Attorney in Chart? Yes - validated most recent copy scanned in chart (See row information)     Chief Complaint  Patient presents with   Acute Visit    Possible UTI     HPI:  Pt is a 82 y.o. female seen today for an acute visit for evaluation of possible UTI symptoms. Started up yesterday.Had urgency and burning sensation.she drunk plenty of water which helped.No symptoms this morning. Also took [facility]  denies any fever,chills,nausea,vomiting,abdominal pain,flank pain,urgency,frequency,dysuria,difficult urination or hematuria. Of note,she was seen at [facility] for acute cystitis after she presented dysuria,urinary burning,frequency and urgency x 2 days.she was prescribed 7 days course of Keflex which she has  completed.    Past Medical History:  Diagnosis Date   Anxiety    Arthritis    "left knee; left hip; lower back; mild to moderate in right hip" (09/30/2016)   Atrial fibrillation (HCC)    Chronic lower back pain    Gastritis    Heart palpitations    History of bone density study 2018   History of mammogram 2021   History of MRI 2018   History of Papanicolaou smear of cervix    Hypercholesteremia    Osteopenia    Seasonal allergies    Past Surgical History:  Procedure Laterality Date   ATRIAL FIBRILLATION ABLATION N/A 06/10/2022   Procedure: ATRIAL FIBRILLATION ABLATION;  Surgeon: Regan Lemming, MD;  Location: [facility] INVASIVE CV LAB;  Service: Cardiovascular;  Laterality: N/A;   CARDIOVERSION N/A 11/17/2019   Procedure: CARDIOVERSION;  Surgeon: Lewayne Bunting, MD;  Location: [facility] ENDOSCOPY;  Service: Cardiovascular;  Laterality: N/A;   CARDIOVERSION N/A 04/16/2021   Procedure: CARDIOVERSION;  Surgeon: Chilton Si, MD;  Location: [facility] ENDOSCOPY;  Service: Cardiovascular;  Laterality: N/A;   CARDIOVERSION N/A 08/15/2021   Procedure: CARDIOVERSION;  Surgeon: Maisie Fus, MD;  Location: [facility] ENDOSCOPY;  Service: Cardiovascular;  Laterality: N/A;   CARDIOVERSION N/A 05/25/2023   Procedure: CARDIOVERSION;  Surgeon: Chrystie Nose, MD;  Location: [facility] INVASIVE CV LAB;  Service: Cardiovascular;  Laterality: N/A;   COLONOSCOPY     DILATION AND CURETTAGE OF UTERUS     JOINT REPLACEMENT     TONSILLECTOMY     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   Procedure: TOTAL HIP ARTHROPLASTY ANTERIOR APPROACH;  Surgeon: Jodi Geralds, MD;  Location: [facility] OR;  Service: Orthopedics;  Laterality: Left;    No Known Allergies  Outpatient Encounter Medications as of 07/27/2023  Medication Sig   acetaminophen (TYLENOL) 325 MG tablet Take 325 mg by mouth every 6 (six) hours as needed for moderate pain or mild pain.   amoxicillin (AMOXIL) 500 MG capsule Take 4 capsules (2,000 mg total) by mouth 1 hour  prior to appointment.   apixaban (ELIQUIS) 5 MG TABS tablet Take 1 tablet (5 mg total) by mouth 2 (two) times daily.   Brimonidine Tartrate (LUMIFY) 0.025 % SOLN Place 1 drop into both eyes daily. Red eyes   Carboxymeth-Glyc-Polysorb PF (REFRESH OPTIVE MEGA-3) 0.5-1-0.5 % SOLN Place 1 drop into both eyes 4 (four) times daily as needed (dry eyes). Enhance with flax seed oil   cephALEXin (KEFLEX) 500 MG capsule Take 1 capsule (500 mg total) by mouth 2 (two) times daily.   diltiazem (CARDIZEM) 30 MG tablet Take 1 tablet every 4 hours AS NEEDED for AFIB heart rate >100 as long as top BP >100.   hydrocortisone cream 0.5 % Apply 1 Application topically 2 (two) times daily as needed for itching.   ketotifen (ZADITOR) 0.025 % ophthalmic solution Place 1 drop into both eyes 2 (two) times daily as needed (itchy eyes). In the fall   loratadine (CLARITIN) 10 MG tablet Take 10 mg by mouth daily as needed for allergies.   mineral oil-hydrophilic petrolatum (AQUAPHOR) ointment Apply 1 Application topically as needed for dry skin.   Multiple Vitamin (ONE-DAILY MULTI VITAMINS PO) Take 30 mLs by mouth daily. [name] liquid vitamin   NON FORMULARY Take 1,000 mg by mouth daily. Plant calcium   OVER THE COUNTER MEDICATION Place 1 application  into both eyes at bedtime. Pure & Clean   rosuvastatin (CRESTOR) 10 MG tablet Take 1 tablet (10 mg total) by mouth daily.   triamcinolone cream (KENALOG) 0.1 % Apply 1 Application topically 2 (two) times daily as needed (itching).   No facility-administered encounter medications on file as of 07/27/2023.    Review of Systems  Constitutional:  Negative for appetite change, chills, fatigue, fever and unexpected weight change.  Eyes:  Negative for pain, discharge, redness, itching and visual disturbance.  Respiratory:  Negative for cough, chest tightness, shortness of breath and wheezing.   Cardiovascular:  Negative for chest pain, palpitations and leg swelling.   Gastrointestinal:  Negative for abdominal distention, abdominal pain, constipation, diarrhea, nausea and vomiting.  Endocrine: Negative for cold intolerance.  Genitourinary:  Positive for dysuria and urgency. Negative for difficulty urinating, flank pain and frequency.  Musculoskeletal:  Negative for arthralgias and gait problem.       Reports chronic lower back pain   Skin:  Negative for color change, pallor, rash and wound.  Neurological:  Negative for dizziness, weakness, light-headedness and headaches.  Psychiatric/Behavioral:  Negative for agitation, behavioral problems, confusion, hallucinations and sleep disturbance. The patient is not nervous/anxious.     Immunization History  Administered Date(s) Administered   Fluad Quad(high Dose 65+) 06/03/2022   Fluad Trivalent(High Dose 65+) 05/26/2023   Influenza Split 04/30/2015, 04/20/2016, 04/27/2018, 06/16/2019   Influenza, High Dose Seasonal PF 06/28/2017, 06/16/2019, 06/02/2020   Influenza-Unspecified 06/19/2021   PFIZER(Purple Top)SARS-COV-2 Vaccination 08/12/2019, 09/02/2019, 05/01/2020, 11/13/2020, 04/12/2021   Pfizer(Comirnaty)Fall Seasonal Vaccine 12 years and older 07/31/2022   Pneumococcal Conjugate-13 09/30/2013   Pneumococcal Polysaccharide-23 07/27/2003, 09/24/2010   Td 04/26/2001, 12/25/2017   Tdap 09/24/2010   Zoster Recombinant(Shingrix) 11/16/2017, 01/06/2018   Zoster, Live 12/11/2006, 04/27/2018, 06/29/2018  Pertinent  Health Maintenance Due  Topic Date Due   INFLUENZA VACCINE  Completed   DEXA SCAN  Completed      10/29/2022   10:19 AM 12/23/2022    3:02 PM 05/01/2023   11:33 AM 05/01/2023    2:27 PM 07/27/2023    9:33 AM  Fall Risk  Falls in the past year? 1 1 0 0 0  Was there an injury with Fall? 1 0 0 0 0  Fall Risk Category Calculator 3 2 0 0 0  Patient at Risk for Falls Due to History of fall(s) History of fall(s) No Fall Risks No Fall Risks   Fall risk Follow up Falls evaluation completed Falls  evaluation completed Falls evaluation completed Falls evaluation completed    Functional Status Survey:    Vitals:   07/27/23 0929  BP: 128/84  Pulse: 60  Resp: 18  Temp: (!) 97.2 F (36.2 C)  SpO2: 99%  Weight: 141 lb (64 kg)  Height: 5\' 1"  (1.549 m)   Body mass index is 26.64 kg/m. Physical Exam Vitals reviewed.  Constitutional:      General: She is not in acute distress.    Appearance: Normal appearance. She is normal weight. She is not ill-appearing or diaphoretic.  HENT:     Head: Normocephalic.     Mouth/Throat:     Mouth: Mucous membranes are moist.     Pharynx: Oropharynx is clear. No oropharyngeal exudate or posterior oropharyngeal erythema.  Eyes:     General: No scleral icterus.       Right eye: No discharge.        Left eye: No discharge.     Extraocular Movements: Extraocular movements intact.     Conjunctiva/sclera: Conjunctivae normal.     Pupils: Pupils are equal, round, and reactive to light.  Cardiovascular:     Rate and Rhythm: Normal rate and regular rhythm.     Pulses: Normal pulses.     Heart sounds: Normal heart sounds. No murmur heard.    No friction rub. No gallop.  Pulmonary:     Effort: Pulmonary effort is normal. No respiratory distress.     Breath sounds: Normal breath sounds. No wheezing, rhonchi or rales.  Chest:     Chest wall: No tenderness.  Abdominal:     General: Bowel sounds are normal. There is no distension.     Palpations: Abdomen is soft. There is no mass.     Tenderness: There is no abdominal tenderness. There is no right CVA tenderness, left CVA tenderness, guarding or rebound.  Musculoskeletal:        General: No swelling or tenderness. Normal range of motion.     Right lower leg: 1+ Pitting Edema present.     Left lower leg: 1+ Pitting Edema present.  Skin:    General: Skin is warm and dry.     Coloration: Skin is not pale.     Findings: No bruising, erythema, lesion or rash.  Neurological:     Mental Status: She  is alert and oriented to person, place, and time.     Cranial Nerves: No cranial nerve deficit.     Sensory: No sensory deficit.     Motor: No weakness.     Coordination: Coordination normal.     Gait: Gait normal.  Psychiatric:        Mood and Affect: Mood normal.        Speech: Speech normal.  Behavior: Behavior normal.     Labs reviewed: Recent Labs    03/19/23 1004 04/21/23 1314 05/20/23 1502  NA 140 143 140  K 3.9 4.5 3.9  CL 107 106 107  CO2 26 25 26   GLUCOSE 89 63* 120*  BUN 22 23 26*  CREATININE 0.78 0.75 0.84  CALCIUM 9.0 9.0 9.2   Recent Labs    12/18/22 0842 03/19/23 1004  AST 33  33 37  ALT 19  19 28   ALKPHOS  --  58  BILITOT 0.9  0.9 1.1  PROT 6.1  6.1 6.4*  ALBUMIN  --  4.0   Recent Labs    12/18/22 0842 03/19/23 1004 04/21/23 1314 05/20/23 1502  WBC 3.5* 3.8* 4.9 4.7  NEUTROABS 1,411* 1.7  --   --   HGB 14.4 14.8 14.4 14.0  HCT 43.7 43.9 44.5 41.7  MCV 98.6 98.7 102* 97.7  PLT 144 169 142* 141*   Lab Results  Component Value Date   TSH 3.36 12/18/2022   No results found for: "HGBA1C" Lab Results  Component Value Date   CHOL 144 12/18/2022   HDL 64 12/18/2022   LDLCALC 66 12/18/2022   TRIG 59 12/18/2022   CHOLHDL 2.3 12/18/2022    Significant Diagnostic Results in last 30 days:  No results found.  Assessment/Plan  1. Dysuria (Primary) Afebrile  - continue on over the counter cranberry tablets - continue to increase fluid intake  - urine send for culture will call with the results in 2-3 days.  - Notify provider if symptoms worsen or running any fever > 100.5  - POC Urinalysis Dipstick showed yellow clear urine negative nitrites but positive for small blood  and Large 3+ Leukocytes.  2. Bilateral lower extremity edema Bilateral 1+ edema  - advised to wear knee high compression stockings  - Keep legs elevated when seated  - Reduce salt intake in the diet    Family/ staff Communication: Reviewed plan of care  with patient verbalized understanding   Labs/tests ordered:  - POC Urinalysis Dipstick - Urine Culture  Next Appointment: Return if symptoms worsen or fail to improve.   Caesar Bookman, NP

## 2023-07-27 NOTE — Patient Instructions (Signed)
-   continue on over the counter cranberry tablets - continue to increase fluid intake  - urine send for culture will call with the results in 2-3 days.  - Notify provider if symptoms worsen or running any fever > 100.5

## 2023-07-28 DIAGNOSIS — R300 Dysuria: Secondary | ICD-10-CM | POA: Diagnosis not present

## 2023-07-28 LAB — URINE CULTURE
MICRO NUMBER:: 15900647
SPECIMEN QUALITY:: ADEQUATE

## 2023-07-30 ENCOUNTER — Other Ambulatory Visit (HOSPITAL_COMMUNITY): Payer: Self-pay

## 2023-07-30 MED ORDER — NITROFURANTOIN MONOHYD MACRO 100 MG PO CAPS
ORAL_CAPSULE | ORAL | 0 refills | Status: DC
  Filled 2023-07-30 – 2023-07-31 (×2): qty 10, 5d supply, fill #0

## 2023-07-31 ENCOUNTER — Other Ambulatory Visit (HOSPITAL_COMMUNITY): Payer: Self-pay

## 2023-08-03 ENCOUNTER — Encounter: Payer: Self-pay | Admitting: Family

## 2023-08-03 ENCOUNTER — Ambulatory Visit (INDEPENDENT_AMBULATORY_CARE_PROVIDER_SITE_OTHER): Payer: PPO | Admitting: Family

## 2023-08-03 VITALS — BP 126/80 | HR 58 | Temp 97.8°F | Resp 20 | Ht 66.5 in | Wt 141.8 lb

## 2023-08-03 DIAGNOSIS — D329 Benign neoplasm of meninges, unspecified: Secondary | ICD-10-CM | POA: Insufficient documentation

## 2023-08-03 DIAGNOSIS — E785 Hyperlipidemia, unspecified: Secondary | ICD-10-CM

## 2023-08-03 DIAGNOSIS — I480 Paroxysmal atrial fibrillation: Secondary | ICD-10-CM | POA: Diagnosis not present

## 2023-08-03 DIAGNOSIS — R0989 Other specified symptoms and signs involving the circulatory and respiratory systems: Secondary | ICD-10-CM

## 2023-08-03 DIAGNOSIS — F1021 Alcohol dependence, in remission: Secondary | ICD-10-CM | POA: Diagnosis not present

## 2023-08-03 LAB — POC COVID19 BINAXNOW: SARS Coronavirus 2 Ag: NEGATIVE

## 2023-08-03 NOTE — Progress Notes (Signed)
 Provider: Roxan Plough FNP-C   Keyandra Swenson, Roxan BROCKS, NP  Patient Care Team: Jaella Weinert, Roxan BROCKS, NP as PCP - General (Family Medicine) Pietro Redell RAMAN, MD as PCP - Cardiology (Cardiology) Inocencio Soyla Lunger, MD as PCP - Electrophysiology (Cardiology) Johnnye Ade, MD as Consulting Physician (Obstetrics and Gynecology) Pietro Redell RAMAN, MD as Consulting Physician (Cardiology) Vernona Slough, [place] (Ophthalmology)  Extended Emergency Contact Information Primary Emergency Contact: Mauriello,Tom Address: [phone]          [place] [address] [place] Mobile Phone: [phone] Relation: Spouse Secondary Emergency Contact: Vernona Slough Address: [address]          [place], [place] [address] [place] Phone: [phone] Relation: Daughter  Code Status:  Full Code  Goals of care: Advanced Directive information    07/27/2023    9:33 AM  Advanced Directives  Does Patient Have a Medical Advance Directive? Yes  Type of Estate Agent of [place];Living will  Does patient want to make changes to medical advance directive? No - Patient declined  Copy of Healthcare Power of Attorney in Chart? Yes - validated most recent copy scanned in chart (See row information)     Chief Complaint  Patient presents with   Medical Management of Chronic Issues    6 months follow up     HPI:  Pt is a 83 y.o. female seen today for 6 months follow up for medical management of chronic diseases.   She complains of sneezing,runny nose and sore throat since last night.She denies any fever,chills,cough,fatigue,body aches,,chest tightness,chest pain,palpitation or shortness of breath.  Also denies any exposure to sick person with COVID-19 or flu. She recently completed antibiotics nitrofurantoin  100 mg capsule 1 by mouth twice daily for 5 days prescribed at [facility] [address] on 07/28/2023 for complaints of  burning and painful urination.  She was previously also treated on 07/18/2023 for UTI at the urgent care. States symptoms have resolved but recently received a message on her phone that she has an antibiotic at her pharmacy that was sent from urgent care.  Patient wonders what will still need antibiotics if she does not have any symptoms.  She denies any further burning or dysuria.Also denies any fever,chills,nausea,vomiting,abdominal pain,flank pain,urgency,frequency,difficult urination or hematuria.    Past Medical History:  Diagnosis Date   Anxiety    Arthritis    left knee; left hip; lower back; mild to moderate in right hip (09/30/2016)   Atrial fibrillation (HCC)    Chronic lower back pain    Gastritis    Heart palpitations    History of bone density study 2018   History of mammogram 2021   History of MRI 2018   History of Papanicolaou smear of cervix    Hypercholesteremia    Osteopenia    Seasonal allergies    Past Surgical History:  Procedure Laterality Date   ATRIAL FIBRILLATION ABLATION N/A 06/10/2022   Procedure: ATRIAL FIBRILLATION ABLATION;  Surgeon: Inocencio Soyla Lunger, MD;  Location: [facility] INVASIVE CV [facility];  Service: Cardiovascular;  Laterality: N/A;   CARDIOVERSION N/A 11/17/2019   Procedure: CARDIOVERSION;  Surgeon: Pietro Redell RAMAN, MD;  Location: [facility] ENDOSCOPY;  Service: Cardiovascular;  Laterality: N/A;   CARDIOVERSION N/A 04/16/2021   Procedure: CARDIOVERSION;  Surgeon: Raford Riggs, MD;  Location: [facility] ENDOSCOPY;  Service: Cardiovascular;  Laterality: N/A;   CARDIOVERSION N/A 08/15/2021   Procedure: CARDIOVERSION;  Surgeon: Alvan Ronal BRAVO, MD;  Location: [facility]  ENDOSCOPY;  Service: Cardiovascular;  Laterality: N/A;   CARDIOVERSION N/A 05/25/2023   Procedure: CARDIOVERSION;  Surgeon: Mona Vinie BROCKS, MD;  Location: [facility] INVASIVE CV LAB;  Service: Cardiovascular;  Laterality: N/A;   COLONOSCOPY     DILATION AND CURETTAGE OF UTERUS     JOINT REPLACEMENT      TONSILLECTOMY     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   Procedure: TOTAL HIP ARTHROPLASTY ANTERIOR APPROACH;  Surgeon: Yvone Rush, MD;  Location: [facility] OR;  Service: Orthopedics;  Laterality: Left;    No Known Allergies  Allergies as of 08/03/2023   No Known Allergies      Medication List        Accurate as of August 03, 2023  8:43 PM. If you have any questions, ask your nurse or doctor.          acetaminophen  325 MG tablet Commonly known as: TYLENOL  Take 325 mg by mouth every 6 (six) hours as needed for moderate pain or mild pain.   amoxicillin  500 MG capsule Commonly known as: AMOXIL  Take 4 capsules (2,000 mg total) by mouth 1 hour prior to appointment.   cephALEXin  500 MG capsule Commonly known as: KEFLEX  Take 1 capsule (500 mg total) by mouth 2 (two) times daily.   diltiazem  30 MG tablet Commonly known as: Cardizem  Take 1 tablet every 4 hours AS NEEDED for AFIB heart rate >100 as long as top BP >100.   Eliquis  5 MG Tabs tablet Generic drug: apixaban  Take 1 tablet (5 mg total) by mouth 2 (two) times daily.   hydrocortisone  cream 0.5 % Apply 1 Application topically 2 (two) times daily as needed for itching.   ketotifen 0.025 % ophthalmic solution Commonly known as: ZADITOR Place 1 drop into both eyes 2 (two) times daily as needed (itchy eyes). In the fall   loratadine 10 MG tablet Commonly known as: CLARITIN Take 10 mg by mouth daily as needed for allergies.   Lumify 0.025 % Soln Generic drug: Brimonidine Tartrate Place 1 drop into both eyes daily. Red eyes   mineral oil-hydrophilic petrolatum ointment Apply 1 Application topically as needed for dry skin.   nitrofurantoin  (macrocrystal-monohydrate) 100 MG capsule Commonly known as: MACROBID  Take 1 capsule (100 mg total) by mouth 2 (two) times a day for 5 days.   NON FORMULARY Take 1,000 mg by mouth daily. Plant calcium    ONE-DAILY MULTI VITAMINS PO Take 30 mLs by mouth daily. Ronal Dines liquid  vitamin   OVER THE COUNTER MEDICATION Place 1 application  into both eyes at bedtime. Pure & Clean   Refresh Optive Mega-3 0.5-1-0.5 % Soln Generic drug: Carboxymeth-Glyc-Polysorb PF Place 1 drop into both eyes 4 (four) times daily as needed (dry eyes). Enhance with flax seed oil   rosuvastatin  10 MG tablet Commonly known as: CRESTOR  Take 1 tablet (10 mg total) by mouth daily.   triamcinolone cream 0.1 % Commonly known as: KENALOG Apply 1 Application topically 2 (two) times daily as needed (itching).        Review of Systems  Constitutional:  Negative for appetite change, chills, fatigue, fever and unexpected weight change.  HENT:  Positive for rhinorrhea, sneezing and sore throat. Negative for congestion, dental problem, ear discharge, ear pain, facial swelling, hearing loss, nosebleeds, postnasal drip, sinus pressure, sinus pain, tinnitus and trouble swallowing.   Eyes:  Negative for pain, discharge, redness, itching and visual disturbance.  Respiratory:  Negative for cough, chest tightness, shortness of breath and wheezing.  Cardiovascular:  Negative for chest pain, palpitations and leg swelling.  Gastrointestinal:  Negative for abdominal distention, abdominal pain, blood in stool, constipation, diarrhea, nausea and vomiting.  Endocrine: Negative for cold intolerance, heat intolerance, polydipsia, polyphagia and polyuria.  Genitourinary:  Negative for difficulty urinating, dysuria, flank pain, frequency and urgency.  Musculoskeletal:  Negative for arthralgias, back pain, gait problem, joint swelling, myalgias, neck pain and neck stiffness.  Skin:  Negative for color change, pallor, rash and wound.  Neurological:  Negative for dizziness, syncope, speech difficulty, weakness, light-headedness, numbness and headaches.  Hematological:  Does not bruise/bleed easily.  Psychiatric/Behavioral:  Negative for agitation, behavioral problems, confusion, hallucinations, self-injury, sleep  disturbance and suicidal ideas. The patient is not nervous/anxious.        Memory lapse     Immunization History  Administered Date(s) Administered   Fluad Quad(high Dose 65+) 06/03/2022   Fluad Trivalent(High Dose 65+) 05/26/2023   Influenza Split 04/30/2015, 04/20/2016, 04/27/2018, 06/16/2019   Influenza, High Dose Seasonal PF 06/28/2017, 06/16/2019, 06/02/2020   Influenza-Unspecified 06/19/2021   PFIZER(Purple Top)SARS-COV-2 Vaccination 08/12/2019, 09/02/2019, 05/01/2020, 11/13/2020, 04/12/2021   Pfizer(Comirnaty )Fall Seasonal Vaccine 12 years and older 07/31/2022   Pneumococcal Conjugate-13 09/30/2013   Pneumococcal Polysaccharide-23 07/27/2003, 09/24/2010   Td 04/26/2001, 12/25/2017   Tdap 09/24/2010   Zoster Recombinant(Shingrix) 11/16/2017, 01/06/2018   Zoster, Live 12/11/2006, 04/27/2018, 06/29/2018   Pertinent  Health Maintenance Due  Topic Date Due   INFLUENZA VACCINE  Completed   DEXA SCAN  Completed      10/29/2022   10:19 AM 12/23/2022    3:02 PM 05/01/2023   11:33 AM 05/01/2023    2:27 PM 07/27/2023    9:33 AM  Fall Risk  Falls in the past year? 1 1 0 0 0  Was there an injury with Fall? 1 0 0 0 0  Fall Risk Category Calculator 3 2 0 0 0  Patient at Risk for Falls Due to History of fall(s) History of fall(s) No Fall Risks No Fall Risks   Fall risk Follow up Falls evaluation completed Falls evaluation completed Falls evaluation completed Falls evaluation completed    Functional Status Survey:    Vitals:   08/03/23 1521  BP: 126/80  Pulse: (!) 58  Resp: 20  Temp: 97.8 F (36.6 C)  SpO2: 97%  Weight: 141 lb 12.8 oz (64.3 kg)  Height: 5' 6.5 (1.689 m)   Body mass index is 22.54 kg/m. Physical Exam Vitals reviewed.  Constitutional:      General: She is not in acute distress.    Appearance: Normal appearance. She is normal weight. She is not ill-appearing or diaphoretic.  HENT:     Head: Normocephalic.     Right Ear: Tympanic membrane, ear canal and  external ear normal. There is no impacted cerumen.     Left Ear: Tympanic membrane, ear canal and external ear normal. There is no impacted cerumen.     Nose: Rhinorrhea present. No congestion.     Mouth/Throat:     Mouth: Mucous membranes are moist.     Pharynx: Oropharynx is clear. No oropharyngeal exudate or posterior oropharyngeal erythema.  Eyes:     General: No scleral icterus.       Right eye: No discharge.        Left eye: No discharge.     Extraocular Movements: Extraocular movements intact.     Conjunctiva/sclera: Conjunctivae normal.     Pupils: Pupils are equal, round, and reactive to light.  Neck:  Vascular: No carotid bruit.  Cardiovascular:     Rate and Rhythm: Normal rate and regular rhythm.     Pulses: Normal pulses.     Heart sounds: Normal heart sounds. No murmur heard.    No friction rub. No gallop.  Pulmonary:     Effort: Pulmonary effort is normal. No respiratory distress.     Breath sounds: Normal breath sounds. No wheezing, rhonchi or rales.  Chest:     Chest wall: No tenderness.  Abdominal:     General: Bowel sounds are normal. There is no distension.     Palpations: Abdomen is soft. There is no mass.     Tenderness: There is no abdominal tenderness. There is no right CVA tenderness, left CVA tenderness, guarding or rebound.  Musculoskeletal:        General: No swelling or tenderness. Normal range of motion.     Cervical back: Normal range of motion. No rigidity or tenderness.     Right lower leg: No edema.     Left lower leg: No edema.  Lymphadenopathy:     Cervical: No cervical adenopathy.  Skin:    General: Skin is warm and dry.     Coloration: Skin is not pale.     Findings: No bruising, erythema, lesion or rash.  Neurological:     Mental Status: She is alert and oriented to person, place, and time.     Cranial Nerves: No cranial nerve deficit.     Sensory: No sensory deficit.     Motor: No weakness.     Coordination: Coordination normal.      Gait: Gait normal.  Psychiatric:        Mood and Affect: Mood normal.        Speech: Speech normal.        Behavior: Behavior normal.        Thought Content: Thought content normal.        Judgment: Judgment normal.     Labs reviewed: Recent Labs    03/19/23 1004 04/21/23 1314 05/20/23 1502  NA 140 143 140  K 3.9 4.5 3.9  CL 107 106 107  CO2 26 25 26   GLUCOSE 89 63* 120*  BUN 22 23 26*  CREATININE 0.78 0.75 0.84  CALCIUM  9.0 9.0 9.2   Recent Labs    12/18/22 0842 03/19/23 1004  AST 33  33 37  ALT 19  19 28   ALKPHOS  --  58  BILITOT 0.9  0.9 1.1  PROT 6.1  6.1 6.4*  ALBUMIN  --  4.0   Recent Labs    12/18/22 0842 03/19/23 1004 04/21/23 1314 05/20/23 1502  WBC 3.5* 3.8* 4.9 4.7  NEUTROABS 1,411* 1.7  --   --   HGB 14.4 14.8 14.4 14.0  HCT 43.7 43.9 44.5 41.7  MCV 98.6 98.7 102* 97.7  PLT 144 169 142* 141*   Lab Results  Component Value Date   TSH 3.36 12/18/2022   No results found for: HGBA1C Lab Results  Component Value Date   CHOL 144 12/18/2022   HDL 64 12/18/2022   LDLCALC 66 12/18/2022   TRIG 59 12/18/2022   CHOLHDL 2.3 12/18/2022    Significant Diagnostic Results in last 30 days:  No results found.  Assessment/Plan 1. Hyperlipidemia LDL goal <100 (Primary) Previous LDL at goal - Lipid panel; Future -Continue on rosuvastatin  -Dietary modification and exercise as tolerated  2. Paroxysmal A-fib (HCC) Heart rate controlled -Continue on diltiazem  -Continue on apixaban   for anticoagulation -Continue to follow-up with a cardiologist - TSH; Future - COMPLETE METABOLIC PANEL WITH GFR; Future - CBC with Differential/Platelet; Future  3.  Symptoms of upper respiratory infection Clear nasal drainage. - POC COVID-19 results negative -Encouraged to increase fluids -Advised to take over-the-counter loratadine 10 mg tablet 1 by mouth daily for 7 days. -Increase vitamin C diet or take vitamin C supplement -Gargle throat with warm  water and salt for sore throat -Notify provider if symptoms worsen or running any fever greater than 100.5  4. Meningioma (HCC) Follows up with Neurosurgery for serial surveillance MRI no surgical intervention recommend unless meningioma had PICC cannot change in size or becomes symptomatic. -Continue to follow-up with the neurosurgery as advised  5. Alcohol dependence, in remission (HCC) Reports no alcohol over 2 years -Continue alcohol cessation   Family/ staff Communication: Reviewed plan of care with patient verbalized understanding  Labs/tests ordered:  - TSH; Future - COMPLETE METABOLIC PANEL WITH GFR; Future - CBC with Differential/Platelet; Future - Lipid panel; Future  Next Appointment : Return in about 6 months (around 01/31/2024) for medical mangement of chronic issues., fasting labs in one week.   Roxan JAYSON Plough, NP

## 2023-08-05 NOTE — Progress Notes (Signed)
  Electrophysiology Office Note:   Date:  08/06/2023  ID:  Shelia [name], DOB [birth year], MRN [id number]  Primary Cardiologist: Redell Shallow, MD Electrophysiologist: Soyla Gladis Norton, MD      History of Present Illness:   Shelia [name] is a 83 y.o. 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   Seen in AF clinic 10/23 in AF and scheduled for DCC 10/28. Had ERAF on 10/30 that converted with 1 dose of prn diltiazem .   Since last being seen in our clinic the patient reports doing well. Overall,  she denies chest pain, dyspnea, PND, orthopnea, nausea, vomiting, dizziness, syncope, edema, weight gain, or early satiety. Has rare, very brief palpitations but nothing persistent.   Review of systems complete and found to be negative unless listed in HPI.   EP Information / Studies Reviewed:    EKG is ordered today. Personal review as below.  EKG Interpretation Date/Time:  Thursday August 06 2023 09:34:32 EST Ventricular Rate:  50 PR Interval:  146 QRS Duration:  82 QT Interval:  430 QTC Calculation: 392 R Axis:   -39  Text Interpretation: Sinus bradycardia with sinus arrhythmia Left axis deviation Low voltage QRS Septal infarct , age undetermined When compared with ECG of 29-May-2023 14:10, Septal infarct is now Present Confirmed by Lesia Sharper [phone]) on 08/06/2023 9:49:05 AM    Physical Exam:   VS:  BP 104/66   Pulse 60   Ht 5' 6.5 (1.689 m)   Wt 136 lb 12.8 oz (62.1 kg)   SpO2 99%   BMI 21.75 kg/m    Wt Readings from Last 3 Encounters:  08/06/23 136 lb 12.8 oz (62.1 kg)  08/03/23 141 lb 12.8 oz (64.3 kg)  07/27/23 141 lb (64 kg)     GEN: No acute distress NECK: No JVD; No carotid bruits CARDIAC: Regular rate and rhythm, no murmurs, rubs, gallops RESPIRATORY:  Clear to auscultation without rales, wheezing or rhonchi  ABDOMEN: Soft, non-tender, non-distended EXTREMITIES:  No edema; No  deformity   ASSESSMENT AND PLAN:    Persistent atrial fibrillation S/p ablation 05/2022 EKG today shows NSR Continue diltiazem  30 mg as needed Medical options include lowest dose of tikosyn, amiodarone, or re-do ablation.  No burden by symptoms  Secondary hypercoagulable state Pt on Eliquis  as above   CAD Denies s/s ischemia  Follow up with Dr. Norton in 6 months  Signed, Sharper Prentice Lesia, PA-C

## 2023-08-06 ENCOUNTER — Encounter: Payer: Self-pay | Admitting: Student

## 2023-08-06 ENCOUNTER — Ambulatory Visit: Payer: PPO | Attending: Cardiology | Admitting: Student

## 2023-08-06 VITALS — BP 104/66 | HR 60 | Ht 66.5 in | Wt 136.8 lb

## 2023-08-06 DIAGNOSIS — I4891 Unspecified atrial fibrillation: Secondary | ICD-10-CM

## 2023-08-06 DIAGNOSIS — D6869 Other thrombophilia: Secondary | ICD-10-CM

## 2023-08-06 DIAGNOSIS — I2510 Atherosclerotic heart disease of native coronary artery without angina pectoris: Secondary | ICD-10-CM

## 2023-08-06 DIAGNOSIS — I4819 Other persistent atrial fibrillation: Secondary | ICD-10-CM | POA: Diagnosis not present

## 2023-08-06 NOTE — Patient Instructions (Signed)
Medication Instructions:  Your physician recommends that you continue on your current medications as directed. Please refer to the Current Medication list given to you today.  *If you need a refill on your cardiac medications before your next appointment, please call your pharmacy*  Lab Work: None ordered If you have labs (blood work) drawn today and your tests are completely normal, you will receive your results only by: MyChart Message (if you have MyChart) OR A paper copy in the mail If you have any lab test that is abnormal or we need to change your treatment, we will call you to review the results.  Follow-Up: At [facility], you and your health needs are our priority.  As part of our continuing mission to provide you with exceptional heart care, we have created designated Provider Care Teams.  These Care Teams include your primary Cardiologist (physician) and Advanced Practice Providers (APPs -  Physician Assistants and Nurse Practitioners) who all work together to provide you with the care you need, when you need it.  Your next appointment:   6 month(s)  Provider:   Will Camnitz, MD  

## 2023-08-07 ENCOUNTER — Other Ambulatory Visit: Payer: Self-pay

## 2023-08-07 DIAGNOSIS — E785 Hyperlipidemia, unspecified: Secondary | ICD-10-CM

## 2023-08-07 DIAGNOSIS — I480 Paroxysmal atrial fibrillation: Secondary | ICD-10-CM

## 2023-08-11 ENCOUNTER — Other Ambulatory Visit: Payer: PPO

## 2023-08-11 ENCOUNTER — Other Ambulatory Visit (HOSPITAL_COMMUNITY): Payer: Self-pay

## 2023-08-11 DIAGNOSIS — E785 Hyperlipidemia, unspecified: Secondary | ICD-10-CM | POA: Diagnosis not present

## 2023-08-11 DIAGNOSIS — I480 Paroxysmal atrial fibrillation: Secondary | ICD-10-CM | POA: Diagnosis not present

## 2023-08-12 LAB — CBC WITH DIFFERENTIAL/PLATELET
Absolute Lymphocytes: 1695 {cells}/uL (ref 850–3900)
Absolute Monocytes: 429 {cells}/uL (ref 200–950)
Basophils Absolute: 19 {cells}/uL (ref 0–200)
Basophils Relative: 0.5 %
Eosinophils Absolute: 68 {cells}/uL (ref 15–500)
Eosinophils Relative: 1.8 %
HCT: 43.4 % (ref 35.0–45.0)
Hemoglobin: 14.3 g/dL (ref 11.7–15.5)
MCH: 32.3 pg (ref 27.0–33.0)
MCHC: 32.9 g/dL (ref 32.0–36.0)
MCV: 98 fL (ref 80.0–100.0)
MPV: 10.8 fL (ref 7.5–12.5)
Monocytes Relative: 11.3 %
Neutro Abs: 1588 {cells}/uL (ref 1500–7800)
Neutrophils Relative %: 41.8 %
Platelets: 150 10*3/uL (ref 140–400)
RBC: 4.43 10*6/uL (ref 3.80–5.10)
RDW: 11.2 % (ref 11.0–15.0)
Total Lymphocyte: 44.6 %
WBC: 3.8 10*3/uL (ref 3.8–10.8)

## 2023-08-12 LAB — TSH: TSH: 3.37 m[IU]/L (ref 0.40–4.50)

## 2023-08-12 LAB — LIPID PANEL
Cholesterol: 153 mg/dL (ref <200)
HDL: 57 mg/dL (ref >=50)
LDL Cholesterol (Calc): 82 mg/dL
Non-HDL Cholesterol (Calc): 96 mg/dL (ref <130)
Total CHOL/HDL Ratio: 2.7 (calc) (ref <5.0)
Triglycerides: 67 mg/dL (ref <150)

## 2023-08-12 LAB — COMPLETE METABOLIC PANEL WITH GFR
AG Ratio: 1.9 (calc) (ref 1.0–2.5)
ALT: 18 U/L (ref 6–29)
AST: 27 U/L (ref 10–35)
Albumin: 4 g/dL (ref 3.6–5.1)
Alkaline phosphatase (APISO): 64 U/L (ref 37–153)
BUN/Creatinine Ratio: 34 (calc) — ABNORMAL HIGH (ref 6–22)
BUN: 26 mg/dL — ABNORMAL HIGH (ref 7–25)
CO2: 30 mmol/L (ref 20–32)
Calcium: 9.2 mg/dL (ref 8.6–10.4)
Chloride: 108 mmol/L (ref 98–110)
Creat: 0.77 mg/dL (ref 0.60–0.95)
Globulin: 2.1 g/dL (ref 1.9–3.7)
Glucose, Bld: 74 mg/dL (ref 65–99)
Potassium: 4.2 mmol/L (ref 3.5–5.3)
Sodium: 145 mmol/L (ref 135–146)
Total Bilirubin: 0.8 mg/dL (ref 0.2–1.2)
Total Protein: 6.1 g/dL (ref 6.1–8.1)
eGFR: 77 mL/min/{1.73_m2} (ref >=60)

## 2023-08-18 ENCOUNTER — Telehealth: Payer: Self-pay

## 2023-08-18 DIAGNOSIS — R413 Other amnesia: Secondary | ICD-10-CM

## 2023-08-18 DIAGNOSIS — D329 Benign neoplasm of meninges, unspecified: Secondary | ICD-10-CM

## 2023-08-18 NOTE — Telephone Encounter (Signed)
Patient called and given [facility] phone number.

## 2023-08-18 NOTE — Telephone Encounter (Signed)
Referral ordered to [facility] with Dr.Antonia B.Ahem as requested.

## 2023-08-18 NOTE — Telephone Encounter (Signed)
Correction patient is requesting referral to  [facility] B. Lucia Gaskins, MD at [facility]. Message routed to PCP [name], [name], NP

## 2023-08-18 NOTE — Telephone Encounter (Signed)
Patient has question about referral

## 2023-08-18 NOTE — Addendum Note (Signed)
Addended byRicharda Blade C on: 08/18/2023 04:51 PM   Modules accepted: Orders

## 2023-08-18 NOTE — Telephone Encounter (Signed)
Noted, and MyChart message sent to patient.

## 2023-08-19 ENCOUNTER — Telehealth: Payer: Self-pay

## 2023-08-19 NOTE — Telephone Encounter (Signed)
Patient called back in and apologized for the miscommunication, stated she thought she had seen Dr. Lucia Gaskins last year. She would like to see Dr. Frances Furbish again. Please disregard switch request

## 2023-08-19 NOTE — Telephone Encounter (Signed)
Ok w switch, but did not need FU here for meningioma, I referred her to [facility] for it.

## 2023-08-19 NOTE — Telephone Encounter (Signed)
We received a new referral for this patient for memory concerns. She was previously seen by Dr Frances Furbish in 2024 for meningioma and advised to f/u with PCP. She was also seen by [facility] for testing. She is now requesting a transfer of care from Dr Frances Furbish to Dr Lucia Gaskins for evaluation.  Please advise if this is acceptable.  Thanks!

## 2023-08-20 ENCOUNTER — Encounter (HOSPITAL_BASED_OUTPATIENT_CLINIC_OR_DEPARTMENT_OTHER): Payer: Self-pay | Admitting: Emergency Medicine

## 2023-08-20 ENCOUNTER — Emergency Department (HOSPITAL_BASED_OUTPATIENT_CLINIC_OR_DEPARTMENT_OTHER): Payer: PPO

## 2023-08-20 ENCOUNTER — Emergency Department (HOSPITAL_BASED_OUTPATIENT_CLINIC_OR_DEPARTMENT_OTHER)
Admission: EM | Admit: 2023-08-20 | Discharge: 2023-08-20 | Disposition: A | Discharge status: Home / Self Care | Payer: PPO | Attending: Emergency Medicine | Admitting: Emergency Medicine

## 2023-08-20 ENCOUNTER — Other Ambulatory Visit: Payer: Self-pay

## 2023-08-20 DIAGNOSIS — R110 Nausea: Secondary | ICD-10-CM | POA: Insufficient documentation

## 2023-08-20 DIAGNOSIS — I672 Cerebral atherosclerosis: Secondary | ICD-10-CM | POA: Diagnosis not present

## 2023-08-20 DIAGNOSIS — I700 Atherosclerosis of aorta: Secondary | ICD-10-CM | POA: Diagnosis not present

## 2023-08-20 DIAGNOSIS — R519 Headache, unspecified: Secondary | ICD-10-CM | POA: Insufficient documentation

## 2023-08-20 DIAGNOSIS — Z7901 Long term (current) use of anticoagulants: Secondary | ICD-10-CM | POA: Insufficient documentation

## 2023-08-20 LAB — CBC WITH DIFFERENTIAL/PLATELET
Abs Immature Granulocytes: 0.03 10*3/uL (ref 0.00–0.07)
Basophils Absolute: 0 10*3/uL (ref 0.0–0.1)
Basophils Relative: 1 %
Eosinophils Absolute: 0 10*3/uL (ref 0.0–0.5)
Eosinophils Relative: 0 %
HCT: 45.1 % (ref 36.0–46.0)
Hemoglobin: 14.9 g/dL (ref 12.0–15.0)
Immature Granulocytes: 1 %
Lymphocytes Relative: 24 %
Lymphs Abs: 1.6 10*3/uL (ref 0.7–4.0)
MCH: 32 pg (ref 26.0–34.0)
MCHC: 33 g/dL (ref 30.0–36.0)
MCV: 97 fL (ref 80.0–100.0)
Monocytes Absolute: 0.3 10*3/uL (ref 0.1–1.0)
Monocytes Relative: 5 %
Neutro Abs: 4.5 10*3/uL (ref 1.7–7.7)
Neutrophils Relative %: 69 %
Platelets: 152 10*3/uL (ref 150–400)
RBC: 4.65 MIL/uL (ref 3.87–5.11)
RDW: 11.8 % (ref 11.5–15.5)
WBC: 6.5 10*3/uL (ref 4.0–10.5)
nRBC: 0 % (ref 0.0–0.2)

## 2023-08-20 LAB — COMPREHENSIVE METABOLIC PANEL
ALT: 28 U/L (ref 0–44)
AST: 33 U/L (ref 15–41)
Albumin: 4.5 g/dL (ref 3.5–5.0)
Alkaline Phosphatase: 60 U/L (ref 38–126)
Anion gap: 9 (ref 5–15)
BUN: 18 mg/dL (ref 8–23)
CO2: 27 mmol/L (ref 22–32)
Calcium: 9.7 mg/dL (ref 8.9–10.3)
Chloride: 105 mmol/L (ref 98–111)
Creatinine, Ser: 0.67 mg/dL (ref 0.44–1.00)
GFR, Estimated: >60 mL/min (ref >60)
Glucose, Bld: 107 mg/dL — ABNORMAL HIGH (ref 70–99)
Potassium: 3.9 mmol/L (ref 3.5–5.1)
Sodium: 141 mmol/L (ref 135–145)
Total Bilirubin: 1 mg/dL (ref 0.0–1.2)
Total Protein: 6.3 g/dL — ABNORMAL LOW (ref 6.5–8.1)

## 2023-08-20 MED ORDER — IOHEXOL 350 MG/ML SOLN
100.0000 mL | Freq: Once | INTRAVENOUS | Status: AC | PRN
  Administered 2023-08-20: 75 mL via INTRAVENOUS

## 2023-08-20 NOTE — ED Provider Notes (Signed)
[facility] Provider Note   CSN: [id number] Arrival date & time: 08/20/23  1355     History  Chief Complaint  Patient presents with   Headache    Shelia [name] is a 83 y.o. female.  The history is provided by the patient, the spouse and medical records. No language interpreter was used.  Headache    83 year old female significant history of A-fib on Eliquis, anxiety, osteopenia presenting with complaint of headache.  Patient report gradual onset of right-sided headache ongoing for the past 3 days.  Headache associate with some mild nausea without any vomiting.  Headache seems to migrate towards the back of her head.  Headache seems to be improving while waiting in the ER.  No associated fever or chills no runny nose sneezing or coughing no vision changes no nausea vomiting diarrhea no neck pain or stiffness no numbness or weakness no confusion no diplopia or loss of vision.  Patient did notice some spasms around her eyelids earlier in the day and she was seen by her daughter who is an optometrist and had vision checked and was told to use warm compress to her eyelids.  Spasm has resolved.  Patient also has a meningioma and had a brain MRI last year.  Patient denies any recent sickness, no medication changes, she is on blood thinner medication and have been compliant with it.  Home Medications Prior to Admission medications   Medication Sig Start Date End Date Taking? Authorizing Provider  acetaminophen (TYLENOL) 325 MG tablet Take 325 mg by mouth every 6 (six) hours as needed for moderate pain or mild pain.    [provider]  amoxicillin (AMOXIL) 500 MG capsule Take 4 capsules (2,000 mg total) by mouth 1 hour prior to appointment. 03/18/23     apixaban (ELIQUIS) 5 MG TABS tablet Take 1 tablet (5 mg total) by mouth 2 (two) times daily. 06/22/23   Camnitz, Andree Coss, MD  Brimonidine Tartrate (LUMIFY) 0.025 % SOLN Place 1 drop  into both eyes daily. Red eyes    [provider]  Carboxymeth-Glyc-Polysorb PF (REFRESH OPTIVE MEGA-3) 0.5-1-0.5 % SOLN Place 1 drop into both eyes 4 (four) times daily as needed (dry eyes). Enhance with flax seed oil    [provider]  diltiazem (CARDIZEM) 30 MG tablet Take 1 tablet every 4 hours AS NEEDED for AFIB heart rate >100 as long as top BP >100. 05/27/23   Fenton, Clint R, PA  hydrocortisone cream 0.5 % Apply 1 Application topically 2 (two) times daily as needed for itching.    [provider]  ketotifen (ZADITOR) 0.025 % ophthalmic solution Place 1 drop into both eyes 2 (two) times daily as needed (itchy eyes). In the fall    [provider]  loratadine (CLARITIN) 10 MG tablet Take 10 mg by mouth daily as needed for allergies.    [provider]  mineral oil-hydrophilic petrolatum (AQUAPHOR) ointment Apply 1 Application topically as needed for dry skin.    [provider]  Multiple Vitamin (ONE-DAILY MULTI VITAMINS PO) Take 30 mLs by mouth daily. [name] liquid vitamin    [provider]  NON FORMULARY Take 1,000 mg by mouth daily. Plant calcium    [provider]  OVER THE COUNTER MEDICATION Place 1 application  into both eyes at bedtime. Pure & Clean    [provider]  rosuvastatin (CRESTOR) 10 MG tablet Take 1 tablet (10 mg total) by mouth  daily. 10/28/22   Lewayne Bunting, MD  triamcinolone cream (KENALOG) 0.1 % Apply 1 Application topically 2 (two) times daily as needed (itching).    [provider]      Allergies    Patient has no known allergies.    Review of Systems   Review of Systems  Neurological:  Positive for headaches.  All other systems reviewed and are negative.   Physical Exam Updated Vital Signs BP (!) 118/52   Pulse 79   Temp 98.1 F (36.7 C) (Oral)   Resp 18   Ht 5\' 5"  (1.651 m)   Wt 62 kg   SpO2 100%   BMI 22.75 kg/m  Physical Exam Vitals and nursing note  reviewed.  Constitutional:      General: She is not in acute distress.    Appearance: She is well-developed.  HENT:     Head: Normocephalic and atraumatic.     Nose: Nose normal.     Mouth/Throat:     Mouth: Mucous membranes are moist.  Eyes:     Extraocular Movements: Extraocular movements intact.     Conjunctiva/sclera: Conjunctivae normal.     Pupils: Pupils are equal, round, and reactive to light.  Cardiovascular:     Rate and Rhythm: Normal rate and regular rhythm.     Pulses: Normal pulses.     Heart sounds: Normal heart sounds.  Pulmonary:     Effort: Pulmonary effort is normal.     Breath sounds: Normal breath sounds.  Abdominal:     Palpations: Abdomen is soft.     Tenderness: There is no abdominal tenderness.  Musculoskeletal:     Cervical back: Normal range of motion and neck supple. No rigidity or tenderness.     Comments: 5 out of 5 strength to all 4 extremities with intact distal pulses.  Normal grip strength.  Skin:    Findings: No rash.  Neurological:     Mental Status: She is alert.     Comments: Neurologic exam:  Speech clear, L pupil is dilated from recent eye medication, extraocular movements intact  Normal peripheral visual fields Cranial nerves III through XII normal including no facial droop Follows commands, moves all extremities x4, normal strength to bilateral upper and lower extremities at all major muscle groups including grip Sensation normal to light touch  Coordination intact, no limb ataxia, finger-nose-finger normal Rapid alternating movements normal No pronator drift Gait not tested   Psychiatric:        Mood and Affect: Mood normal.     ED Results / Procedures / Treatments   Labs (all labs ordered are listed, but only abnormal results are displayed) Labs Reviewed  COMPREHENSIVE METABOLIC PANEL - Abnormal; Notable for the following components:      Result Value   Glucose, Bld 107 (*)    Total Protein 6.3 (*)    All other  components within normal limits  CBC WITH DIFFERENTIAL/PLATELET    EKG None  Radiology CT ANGIO HEAD NECK W WO CM Result Date: 08/20/2023 CLINICAL DATA:  Acute onset of severe right-sided headaches for 2 days with nausea. No significant past history of headaches. EXAM: CT ANGIOGRAPHY HEAD AND NECK WITH AND WITHOUT CONTRAST TECHNIQUE: Multidetector CT imaging of the head and neck was performed using the standard protocol during bolus administration of intravenous contrast. Multiplanar CT image reconstructions and MIPs were obtained to evaluate the vascular anatomy. Carotid stenosis measurements (when applicable) are obtained utilizing NASCET criteria, using the distal internal  carotid diameter as the denominator. RADIATION DOSE REDUCTION: This exam was performed according to the departmental dose-optimization program which includes automated exposure control, adjustment of the mA and/or kV according to patient size and/or use of iterative reconstruction technique. CONTRAST:  75mL OMNIPAQUE IOHEXOL 350 MG/ML SOLN COMPARISON:  CT head without contrast 10/27/22. MR head without and with contrast 12/01/2022. FINDINGS: CT HEAD FINDINGS Brain: A calcified meningioma over the left frontal convexity is stable measuring 16 x 11 mm. No other dural-based lesions are present. Mild atrophy and white matter changes are stable, likely within normal limits for age. Deep brain nuclei are within normal limits. The ventricles are of normal size. No significant extraaxial fluid collection is present. The brainstem and cerebellum are within normal limits. Midline structures are within normal limits. Vascular: No hyperdense vessel or unexpected calcification. Skull: Calvarium is intact. No focal lytic or blastic lesions are present. No significant extracranial soft tissue lesion is present. Sinuses/Orbits: The paranasal sinuses and mastoid air cells are clear. Bilateral lens replacements are noted. Globes and orbits are otherwise  unremarkable. Review of the MIP images confirms the above findings CTA NECK FINDINGS Aortic arch: A 3 vessel arch configuration is present. Atherosclerotic changes are present at the aortic arch. No significant stenosis is present at the great vessel origins. No dissection is present. Right carotid system: The right common carotid artery is within normal limits. Minimal calcification is present at the bifurcation without significant stenosis. The cervical right ICA is normal. Left carotid system: The left common carotid artery is within normal limits. The bifurcation is unremarkable. The cervical left ICA is normal. Vertebral arteries: The right vertebral artery is the dominant vessel. Both vertebral arteries originate from the subclavian artery without significant stenosis. Mild tortuosity is present in the left the A1 segment without significant stenosis. No significant stenosis is present in either vertebral artery in the neck. Skeleton: Vertebral body heights and alignment are normal. Uncovertebral spurring is present at C5-6 and C6-7 without definite stenosis. No focal osseous lesions are present. Other neck: Soft tissues the neck are otherwise unremarkable. Salivary glands are within normal limits. Thyroid is normal. No significant adenopathy is present. No focal mucosal or submucosal lesions are present. Upper chest: The lung apices are clear. The thoracic inlet is within normal limits. Review of the MIP images confirms the above findings CTA HEAD FINDINGS Anterior circulation: The internal carotid arteries are within normal limits from the skull base to the ICA termini. The A1 and M1 segments are normal. The anterior communicating artery is patent. The MCA bifurcations are within normal limits bilaterally. The ACA and MCA branch vessels are normal bilaterally. No aneurysm is present. Posterior circulation: PICA origins are visualized and normal. The vertebrobasilar junction basilar artery normal. The  superior cerebellar arteries are patent. Both posterior cerebral arteries originate from the basilar tip. The PCA branch vessels are within normal limits bilaterally. No aneurysm is present. Venous sinuses: The dural sinuses are patent. The straight sinus and deep cerebral veins are intact. Cortical veins are within normal limits. No significant vascular malformation is evident. Anatomic variants: None Review of the MIP images confirms the above findings IMPRESSION: 1. Normal CTA of the head and neck. No large vessel occlusion, hemodynamically significant stenosis, or other acute vascular abnormality. 2. Stable calcified meningioma over the left frontal convexity. 3. Mild atrophy and white matter changes are stable, likely within normal limits for age. 4.  Aortic Atherosclerosis (ICD10-I70.0). Electronically Signed   By: Virl Son.D.  On: 08/20/2023 18:51    Procedures Procedures    Medications Ordered in ED Medications  iohexol (OMNIPAQUE) 350 MG/ML injection 100 mL (75 mLs Intravenous Contrast Given 08/20/23 1814)    ED Course/ Medical Decision Making/ A&P                                 Medical Decision Making  BP (!) 140/70   Pulse 68   Temp 98.1 F (36.7 C) (Oral)   Resp 18   Ht 5\' 5"  (1.651 m)   Wt 62 kg   SpO2 100%   BMI 22.75 kg/m   31:30 PM 83 year old female significant history of A-fib on Eliquis, anxiety, osteopenia presenting with complaint of headache.  Patient report gradual onset of right-sided headache ongoing for the past 3 days.  Headache associate with some mild nausea without any vomiting.  Headache seems to migrate towards the back of her head.  Headache seems to be improving while waiting in the ER.  No associated fever or chills no runny nose sneezing or coughing no vision changes no nausea vomiting diarrhea no neck pain or stiffness no numbness or weakness no confusion no diplopia or loss of vision.  Patient did notice some spasms around her eyelids  earlier in the day and she was seen by her daughter who is an optometrist and had vision checked and was told to use warm compress to her eyelids.  Spasm has resolved.  Patient also has a meningioma and had a brain MRI last year.  Patient denies any recent sickness, no medication changes, she is on blood thinner medication and have been compliant with it.  Exam overall reassuring, no nuchal rigidity concerning for meningitis, no sudden onset thunderclap headache concerning for subarachnoid hemorrhage, no focal neurodeficit concerning for stroke or space-occupying lesion.  Patient's left pupil is more dilated compared to the right however patient recently was seen by her optometrist today and had eyedrops placed so this is likely secondary to that   -Labs ordered, independently viewed and interpreted by me.  Labs remarkable for reassuring labs -The patient was maintained on a cardiac monitor.  I personally viewed and interpreted the cardiac monitored which showed an underlying rhythm of: NSR -Imaging independently viewed and interpreted by me and I agree with radiologist's interpretation.  Result remarkable for head/neck CTA without acute changes.  Calcified L meningioma unchanged from prior -This patient presents to the ED for concern of headache, this involves an extensive number of treatment options, and is a complaint that carries with it a high risk of complications and morbidity.  The differential diagnosis includes SAH, dissection, stroke, migraine, tension headache, clustered headache, meningitis -Co morbidities that complicate the patient evaluation includes meningioma, afib on eliquis, anxiety -Treatment includes reassurance.  Tylenol offered, pt declined -Reevaluation of the patient after these medicines showed that the patient improved -PCP office notes or outside notes reviewed -Discussion with attending Dr. Rubin Payor -Escalation to admission/observation considered: patients feels much  better, is comfortable with discharge, and will follow up with PCP -Prescription medication considered, patient comfortable with OTC tylenol -Social Determinant of Health considered which includes anxiety         Final Clinical Impression(s) / ED Diagnoses Final diagnoses:  Bad headache    Rx / DC Orders ED Discharge Orders     None         Fayrene Helper, PA-C 08/20/23 1936    Rubin Payor,  Harrold Donath, MD 08/20/23 2106

## 2023-08-20 NOTE — Discharge Instructions (Addendum)
You have been evaluated for your symptoms.  Fortunately CT scan of your head and neck did not show any concerning feature.  Your calcified meningioma on the left side has not changed.  Your labs are overall reassuring.  Please continue to take Tylenol upward to 1000 mg every 6-8 hours as needed for headache.  Do not exceed more than 3000 mg in 1 day.  Follow-up with your doctor for further care.  Return if any concern.

## 2023-08-20 NOTE — Telephone Encounter (Signed)
Thank you. At this point I do get requests from patients and providers to see other physicians' patients and I feel due to continuation of care these patients should stay with their current physicians.

## 2023-08-20 NOTE — ED Provider Triage Note (Signed)
Emergency Medicine Provider Triage Evaluation Note  NIDHI MECCIA , a 83 y.o. female  was evaluated in triage.  Pt complains of right sided headachex2-3 days. Was seen by optometrist-- cleared, eyes are currently dilated. No hx of migraines. No weakness or dizziness . Endorses some nausea.  Review of Systems  Positive: headache Negative: Vision changes  Physical Exam  BP (!) 163/80 (BP Location: Left Arm)   Pulse 64   Temp 97.8 F (36.6 C) (Oral)   Resp 18   Ht 5\' 5"  (1.651 m)   Wt 62 kg   SpO2 99%   BMI 22.75 kg/m  Gen:   Awake, no distress   Resp:  Normal effort  MSK:   Moves extremities without difficulty  Other:  No neurodeficits  Medical Decision Making  Medically screening exam initiated at 2:32 PM.  Appropriate orders placed.  OUMY EUDY was informed that the remainder of the evaluation will be completed by another provider, this initial triage assessment does not replace that evaluation, and the importance of remaining in the ED until their evaluation is complete.     Pete Pelt, [place] 08/20/23 1432

## 2023-08-20 NOTE — ED Triage Notes (Signed)
C/o severe headache on R sided of head x 2 days w/ nausea. No hx of migraines. No focal deficits. Patient endorses going to eye doctor today.

## 2023-08-25 ENCOUNTER — Encounter: Payer: Self-pay | Admitting: Family

## 2023-08-25 ENCOUNTER — Ambulatory Visit (INDEPENDENT_AMBULATORY_CARE_PROVIDER_SITE_OTHER): Payer: PPO | Admitting: Family

## 2023-08-25 VITALS — BP 138/86 | HR 60 | Temp 97.6°F | Resp 18 | Ht 65.0 in | Wt 137.0 lb

## 2023-08-25 DIAGNOSIS — R519 Headache, unspecified: Secondary | ICD-10-CM

## 2023-08-25 DIAGNOSIS — R413 Other amnesia: Secondary | ICD-10-CM | POA: Diagnosis not present

## 2023-08-25 DIAGNOSIS — I480 Paroxysmal atrial fibrillation: Secondary | ICD-10-CM

## 2023-08-25 DIAGNOSIS — D329 Benign neoplasm of meninges, unspecified: Secondary | ICD-10-CM

## 2023-08-25 NOTE — Progress Notes (Signed)
Provider: Richarda Blade FNP-C  Riyanna Crutchley, [name], NP  Patient Care Team: Corley Maffeo, [name], NP as PCP - General (Family Medicine) Jens Som Madolyn Frieze, MD as PCP - Cardiology (Cardiology) Regan Lemming, MD as PCP - Electrophysiology (Cardiology) Richarda Overlie, MD as Consulting Physician (Obstetrics and Gynecology) Jens Som Madolyn Frieze, MD as Consulting Physician (Cardiology) Rodrigo Ran, [place] (Ophthalmology)  Extended Emergency Contact Information Primary Emergency Contact: Buchbinder,Tom Address: [phone] [facility] [address]          [place] [address] [place] Phone: [phone] Relation: Spouse Secondary Emergency Contact: Rodrigo Ran Address: [address]          [place], [place] [address] [place] Mobile Phone: [phone] Relation: Daughter  Code Status:  Full Code  Goals of care: Advanced Directive information    08/25/2023    1:41 PM  Advanced Directives  Does Patient Have a Medical Advance Directive? No  Type of Estate agent of [place];Living will  Does patient want to make changes to medical advance directive? No - Patient declined  Copy of Healthcare Power of Attorney in Chart? Yes - validated most recent copy scanned in chart (See row information)  Would patient like information on creating a medical advance directive? No - Patient declined     Chief Complaint  Patient presents with   Transitions Of Care    Follow up from ER visit.   Discussed the use of AI scribe software for clinical note transcription with the patient, who gave verbal consent to proceed.  HPI:  Pt is a 83 y.o. female seen today for an acute visit with persistent headaches following an emergency room visit on 08/20/2023. She is accompanied by her Husband.  She has been experiencing persistent headaches since Saturday morning, which is unusual for her as she does not frequently get headaches. The headache was severe enough to  prompt her to seek evaluation from her daughter who is an optometrist, who conducted a complete eye exam, including a retinal examination, which was unremarkable. Following her daughter's examination, her sister, a retired Development worker, community. Advised her to go to the emergency room.  At the ER, she underwent a CT scan, which showed no changes except for an unchanged calcified meningioma. Her lab work and heart monitoring were normal, showing normal sinus rhythm. Despite these findings, she continues to experience headaches, particularly noting a bad headache at 3:30 AM that improved when she changed her sleeping position turning from right side to the left side..  No weakness, facial drooping, or changes in vision associated with the headaches. She notes feeling 'a little off' and attributes this to poor sleep patterns. She also experiences a heavy sensation in her right eye and occasional eye twitching, which her daughter believes will resolve on its own. No neck pain beyond what she considers normal from computer and phone use.  She is currently taking Tylenol, 1000 mg every six to eight hours, which helps alleviate the headache. She has a history of falls, which led to the discovery of a meningioma, but does not report any recent head injuries.  Her nose has been running, primarily from the right side, and she has a history of allergies, though she has not taken Claritin recently.       Past Medical History:  Diagnosis Date   Anxiety    Arthritis    "left knee; left hip; lower back; mild to moderate in right hip" (09/30/2016)   Atrial fibrillation (HCC)  Chronic lower back pain    Gastritis    Heart palpitations    History of bone density study 2018   History of mammogram 2021   History of MRI 2018   History of Papanicolaou smear of cervix    Hypercholesteremia    Osteopenia    Seasonal allergies    Past Surgical History:  Procedure Laterality Date   ATRIAL FIBRILLATION ABLATION N/A  06/10/2022   Procedure: ATRIAL FIBRILLATION ABLATION;  Surgeon: Regan Lemming, MD;  Location: [facility] INVASIVE CV LAB;  Service: Cardiovascular;  Laterality: N/A;   CARDIOVERSION N/A 11/17/2019   Procedure: CARDIOVERSION;  Surgeon: Lewayne Bunting, MD;  Location: [facility] ENDOSCOPY;  Service: Cardiovascular;  Laterality: N/A;   CARDIOVERSION N/A 04/16/2021   Procedure: CARDIOVERSION;  Surgeon: Chilton Si, MD;  Location: [facility] ENDOSCOPY;  Service: Cardiovascular;  Laterality: N/A;   CARDIOVERSION N/A 08/15/2021   Procedure: CARDIOVERSION;  Surgeon: Maisie Fus, MD;  Location: [facility] ENDOSCOPY;  Service: Cardiovascular;  Laterality: N/A;   CARDIOVERSION N/A 05/25/2023   Procedure: CARDIOVERSION;  Surgeon: Chrystie Nose, MD;  Location: [facility] INVASIVE CV LAB;  Service: Cardiovascular;  Laterality: N/A;   COLONOSCOPY     DILATION AND CURETTAGE OF UTERUS     JOINT REPLACEMENT     TONSILLECTOMY     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   Procedure: TOTAL HIP ARTHROPLASTY ANTERIOR APPROACH;  Surgeon: Jodi Geralds, MD;  Location: [facility] OR;  Service: Orthopedics;  Laterality: Left;    No Known Allergies  Outpatient Encounter Medications as of 08/25/2023  Medication Sig   acetaminophen (TYLENOL) 325 MG tablet Take 325 mg by mouth every 6 (six) hours as needed for moderate pain or mild pain.   apixaban (ELIQUIS) 5 MG TABS tablet Take 1 tablet (5 mg total) by mouth 2 (two) times daily.   Brimonidine Tartrate (LUMIFY) 0.025 % SOLN Place 1 drop into both eyes daily. Red eyes   Carboxymeth-Glyc-Polysorb PF (REFRESH OPTIVE MEGA-3) 0.5-1-0.5 % SOLN Place 1 drop into both eyes 4 (four) times daily as needed (dry eyes). Enhance with flax seed oil   hydrocortisone cream 0.5 % Apply 1 Application topically 2 (two) times daily as needed for itching.   ketotifen (ZADITOR) 0.025 % ophthalmic solution Place 1 drop into both eyes 2 (two) times daily as needed (itchy eyes). In the fall   loratadine (CLARITIN) 10 MG tablet  Take 10 mg by mouth daily as needed for allergies.   Multiple Vitamin (ONE-DAILY MULTI VITAMINS PO) Take 30 mLs by mouth daily. [name] liquid vitamin   NON FORMULARY Take 1,000 mg by mouth daily. Plant calcium   rosuvastatin (CRESTOR) 10 MG tablet Take 1 tablet (10 mg total) by mouth daily.   triamcinolone cream (KENALOG) 0.1 % Apply 1 Application topically 2 (two) times daily as needed (itching).   Vitamins C E (CRANBERRY URINARY COMFORT PO) Take by mouth.   amoxicillin (AMOXIL) 500 MG capsule Take 4 capsules (2,000 mg total) by mouth 1 hour prior to appointment. (Patient not taking: Reported on 08/25/2023)   diltiazem (CARDIZEM) 30 MG tablet Take 1 tablet every 4 hours AS NEEDED for AFIB heart rate >100 as long as top BP >100. (Patient not taking: Reported on 08/25/2023)   mineral oil-hydrophilic petrolatum (AQUAPHOR) ointment Apply 1 Application topically as needed for dry skin. (Patient not taking: Reported on 08/25/2023)   OVER THE COUNTER MEDICATION Place 1 application  into both eyes at bedtime. Pure & Clean (Patient not taking: Reported on 08/25/2023)  No facility-administered encounter medications on file as of 08/25/2023.    Review of Systems  Constitutional:  Negative for appetite change, chills, fatigue, fever and unexpected weight change.  HENT:  Negative for congestion, dental problem, ear discharge, ear pain, facial swelling, hearing loss, nosebleeds, postnasal drip, rhinorrhea, sinus pressure, sinus pain, sneezing, sore throat, tinnitus and trouble swallowing.   Eyes:  Negative for pain, discharge, redness, itching and visual disturbance.  Respiratory:  Negative for cough, chest tightness, shortness of breath and wheezing.   Cardiovascular:  Negative for chest pain, palpitations and leg swelling.  Gastrointestinal:  Negative for abdominal distention, abdominal pain, blood in stool, constipation, diarrhea, nausea and vomiting.  Endocrine: Negative for cold intolerance, heat  intolerance, polydipsia, polyphagia and polyuria.  Genitourinary:  Negative for difficulty urinating, dysuria, flank pain, frequency and urgency.  Musculoskeletal:  Negative for arthralgias, back pain, gait problem, joint swelling, myalgias, neck pain and neck stiffness.  Skin:  Negative for color change, pallor, rash and wound.  Neurological:  Positive for headaches. Negative for dizziness, syncope, speech difficulty, weakness, light-headedness and numbness.  Hematological:  Does not bruise/bleed easily.  Psychiatric/Behavioral:  Negative for agitation, behavioral problems, confusion, hallucinations, self-injury, sleep disturbance and suicidal ideas. The patient is not nervous/anxious.     Immunization History  Administered Date(s) Administered   Fluad Quad(high Dose 65+) 06/03/2022   Fluad Trivalent(High Dose 65+) 05/26/2023   Influenza Split 04/30/2015, 04/20/2016, 04/27/2018, 06/16/2019   Influenza, High Dose Seasonal PF 06/28/2017, 06/16/2019, 06/02/2020   Influenza-Unspecified 06/19/2021   Moderna Covid-19 Fall Seasonal Vaccine 44yrs & older 06/08/2023   PFIZER(Purple Top)SARS-COV-2 Vaccination 08/12/2019, 09/02/2019, 05/01/2020, 11/13/2020, 04/12/2021   Pfizer(Comirnaty)Fall Seasonal Vaccine 12 years and older 07/31/2022   Pneumococcal Conjugate,unspecified 06/08/2023   Pneumococcal Conjugate-13 09/30/2013   Pneumococcal Polysaccharide-23 07/27/2003, 09/24/2010   Td 04/26/2001, 12/25/2017   Tdap 09/24/2010   Zoster Recombinant(Shingrix) 11/16/2017, 01/06/2018   Zoster, Live 12/11/2006, 04/27/2018, 06/29/2018   Pertinent  Health Maintenance Due  Topic Date Due   INFLUENZA VACCINE  Completed   DEXA SCAN  Completed      12/23/2022    3:02 PM 05/01/2023   11:33 AM 05/01/2023    2:27 PM 07/27/2023    9:33 AM 08/25/2023    1:37 PM  Fall Risk  Falls in the past year? 1 0 0 0 0  Was there an injury with Fall? 0 0 0 0 0  Fall Risk Category Calculator 2 0 0 0 0  Patient at Risk  for Falls Due to History of fall(s) No Fall Risks No Fall Risks    Fall risk Follow up Falls evaluation completed Falls evaluation completed Falls evaluation completed     Functional Status Survey:    Vitals:   08/25/23 1348  BP: 138/86  Pulse: 60  Resp: 18  Temp: 97.6 F (36.4 C)  SpO2: 99%  Weight: 137 lb (62.1 kg)  Height: 5\' 5"  (1.651 m)   Body mass index is 22.8 kg/m. Physical Exam Vitals reviewed.  Constitutional:      General: She is not in acute distress.    Appearance: Normal appearance. She is normal weight. She is not ill-appearing or diaphoretic.  HENT:     Head: Normocephalic.     Right Ear: Tympanic membrane, ear canal and external ear normal. There is no impacted cerumen.     Left Ear: Tympanic membrane, ear canal and external ear normal. There is no impacted cerumen.     Nose: Nose normal. No congestion or rhinorrhea.  Mouth/Throat:     Mouth: Mucous membranes are moist.     Pharynx: Oropharynx is clear. No oropharyngeal exudate or posterior oropharyngeal erythema.  Eyes:     General: No scleral icterus.       Right eye: No discharge.        Left eye: No discharge.     Extraocular Movements: Extraocular movements intact.     Conjunctiva/sclera: Conjunctivae normal.     Pupils: Pupils are equal, round, and reactive to light.  Neck:     Vascular: No carotid bruit.  Cardiovascular:     Rate and Rhythm: Normal rate and regular rhythm.     Pulses: Normal pulses.     Heart sounds: Normal heart sounds. No murmur heard.    No friction rub. No gallop.  Pulmonary:     Effort: Pulmonary effort is normal. No respiratory distress.     Breath sounds: Normal breath sounds. No wheezing, rhonchi or rales.  Chest:     Chest wall: No tenderness.  Abdominal:     General: Bowel sounds are normal. There is no distension.     Palpations: Abdomen is soft. There is no mass.     Tenderness: There is no abdominal tenderness. There is no right CVA tenderness, left CVA  tenderness, guarding or rebound.  Musculoskeletal:        General: No swelling or tenderness. Normal range of motion.     Cervical back: Normal range of motion. No rigidity or tenderness.     Right lower leg: No edema.     Left lower leg: No edema.  Lymphadenopathy:     Cervical: No cervical adenopathy.  Skin:    General: Skin is warm and dry.     Coloration: Skin is not pale.     Findings: No bruising, erythema, lesion or rash.  Neurological:     Mental Status: She is alert and oriented to person, place, and time.     Cranial Nerves: No cranial nerve deficit.     Sensory: No sensory deficit.     Motor: No weakness.     Coordination: Coordination normal.     Gait: Gait normal.  Psychiatric:        Mood and Affect: Mood normal.        Speech: Speech normal.        Behavior: Behavior normal.        Cognition and Memory: She exhibits impaired recent memory.     Labs reviewed: Recent Labs    05/20/23 1502 08/11/23 0856 08/20/23 1645  NA 140 145 141  K 3.9 4.2 3.9  CL 107 108 105  CO2 26 30 27   GLUCOSE 120* 74 107*  BUN 26* 26* 18  CREATININE 0.84 0.77 0.67  CALCIUM 9.2 9.2 9.7   Recent Labs    03/19/23 1004 08/11/23 0856 08/20/23 1645  AST 37 27 33  ALT 28 18 28   ALKPHOS 58  --  60  BILITOT 1.1 0.8 1.0  PROT 6.4* 6.1 6.3*  ALBUMIN 4.0  --  4.5   Recent Labs    03/19/23 1004 04/21/23 1314 05/20/23 1502 08/11/23 0856 08/20/23 1645  WBC 3.8*   < > 4.7 3.8 6.5  NEUTROABS 1.7  --   --  1,588 4.5  HGB 14.8   < > 14.0 14.3 14.9  HCT 43.9   < > 41.7 43.4 45.1  MCV 98.7   < > 97.7 98.0 97.0  PLT 169   < >  141* 150 152   < > = values in this interval not displayed.   Lab Results  Component Value Date   TSH 3.37 08/11/2023   No results found for: "HGBA1C" Lab Results  Component Value Date   CHOL 153 08/11/2023   HDL 57 08/11/2023   LDLCALC 82 08/11/2023   TRIG 67 08/11/2023   CHOLHDL 2.7 08/11/2023    Significant Diagnostic Results in last 30 days:   CT ANGIO HEAD NECK W WO CM Result Date: 08/20/2023 CLINICAL DATA:  Acute onset of severe right-sided headaches for 2 days with nausea. No significant past history of headaches. EXAM: CT ANGIOGRAPHY HEAD AND NECK WITH AND WITHOUT CONTRAST TECHNIQUE: Multidetector CT imaging of the head and neck was performed using the standard protocol during bolus administration of intravenous contrast. Multiplanar CT image reconstructions and MIPs were obtained to evaluate the vascular anatomy. Carotid stenosis measurements (when applicable) are obtained utilizing NASCET criteria, using the distal internal carotid diameter as the denominator. RADIATION DOSE REDUCTION: This exam was performed according to the departmental dose-optimization program which includes automated exposure control, adjustment of the mA and/or kV according to patient size and/or use of iterative reconstruction technique. CONTRAST:  75mL OMNIPAQUE IOHEXOL 350 MG/ML SOLN COMPARISON:  CT head without contrast 10/27/22. MR head without and with contrast 12/01/2022. FINDINGS: CT HEAD FINDINGS Brain: A calcified meningioma over the left frontal convexity is stable measuring 16 x 11 mm. No other dural-based lesions are present. Mild atrophy and white matter changes are stable, likely within normal limits for age. Deep brain nuclei are within normal limits. The ventricles are of normal size. No significant extraaxial fluid collection is present. The brainstem and cerebellum are within normal limits. Midline structures are within normal limits. Vascular: No hyperdense vessel or unexpected calcification. Skull: Calvarium is intact. No focal lytic or blastic lesions are present. No significant extracranial soft tissue lesion is present. Sinuses/Orbits: The paranasal sinuses and mastoid air cells are clear. Bilateral lens replacements are noted. Globes and orbits are otherwise unremarkable. Review of the MIP images confirms the above findings CTA NECK FINDINGS Aortic  arch: A 3 vessel arch configuration is present. Atherosclerotic changes are present at the aortic arch. No significant stenosis is present at the great vessel origins. No dissection is present. Right carotid system: The right common carotid artery is within normal limits. Minimal calcification is present at the bifurcation without significant stenosis. The cervical right ICA is normal. Left carotid system: The left common carotid artery is within normal limits. The bifurcation is unremarkable. The cervical left ICA is normal. Vertebral arteries: The right vertebral artery is the dominant vessel. Both vertebral arteries originate from the subclavian artery without significant stenosis. Mild tortuosity is present in the left the A1 segment without significant stenosis. No significant stenosis is present in either vertebral artery in the neck. Skeleton: Vertebral body heights and alignment are normal. Uncovertebral spurring is present at C5-6 and C6-7 without definite stenosis. No focal osseous lesions are present. Other neck: Soft tissues the neck are otherwise unremarkable. Salivary glands are within normal limits. Thyroid is normal. No significant adenopathy is present. No focal mucosal or submucosal lesions are present. Upper chest: The lung apices are clear. The thoracic inlet is within normal limits. Review of the MIP images confirms the above findings CTA HEAD FINDINGS Anterior circulation: The internal carotid arteries are within normal limits from the skull base to the ICA termini. The A1 and M1 segments are normal. The anterior communicating artery is  patent. The MCA bifurcations are within normal limits bilaterally. The ACA and MCA branch vessels are normal bilaterally. No aneurysm is present. Posterior circulation: PICA origins are visualized and normal. The vertebrobasilar junction basilar artery normal. The superior cerebellar arteries are patent. Both posterior cerebral arteries originate from the  basilar tip. The PCA branch vessels are within normal limits bilaterally. No aneurysm is present. Venous sinuses: The dural sinuses are patent. The straight sinus and deep cerebral veins are intact. Cortical veins are within normal limits. No significant vascular malformation is evident. Anatomic variants: None Review of the MIP images confirms the above findings IMPRESSION: 1. Normal CTA of the head and neck. No large vessel occlusion, hemodynamically significant stenosis, or other acute vascular abnormality. 2. Stable calcified meningioma over the left frontal convexity. 3. Mild atrophy and white matter changes are stable, likely within normal limits for age. 4.  Aortic Atherosclerosis (ICD10-I70.0). Electronically Signed   By: Marin Roberts M.D.   On: 08/20/2023 18:51   Assessment/Plan  Headache Presents with severe right-sided headaches, sometimes alleviated by changing head position. No associated weakness, vision changes, or swallowing difficulties. CT scan from ER visit showed an unchanged calcified meningioma. No signs of sinus infection or other acute issues on physical exam. Differential includes tension headache, sinus headache, or other neurological causes. Discussed risks of Tylenol overuse, including potential liver damage, and the importance of not exceeding 3000 mg per day. Claritin recommended for runny nose. Referral to neurologist for further evaluation due to persistence and severity of headaches. - Refer to neurologist for further evaluation - Continue Tylenol 1000 mg in the morning and 1000 mg in the evening, with an additional 500 mg in the afternoon if needed, not exceeding 3000 mg per day - Use Claritin for runny nose - Follow up if headaches worsen or new symptoms develop - Ambulatory referral to Neurology  Atrial Fibrillation On Eliquis for atrial fibrillation. No new symptoms or complications reported. Heart rate and rhythm were normal during ER visit. - Continue  Eliquis as prescribed   Meningioma (HCC) No changes per recent CT scan done in the ED  - continue to follow up with Neurologist   Memory loss Follow up with Neurologist as schedule 09/2023   General Health Maintenance Discussed hydration and dietary habits. Advised on the importance of regular meals to prevent headaches. Blood pressure and other vitals were within normal limits. Recommended adjusting computer screen brightness to reduce eye strain. - Encourage drinking 6-8 glasses of water daily - Advise regular meals to prevent hypoglycemia-related headaches - Recommend adjusting computer screen brightness to reduce eye strain  Follow-up - Request earlier neurology appointment for headache evaluation - Contact office if referral is not scheduled within 1-2 weeks - Maintain current neurology appointment in March for memory evaluation.     Family/ staff Communication: Reviewed plan of care with patient verbalized understanding   Labs/tests ordered: None   Next Appointment: Return if symptoms worsen or fail to improve.   Caesar Bookman, NP

## 2023-08-26 DIAGNOSIS — R519 Headache, unspecified: Secondary | ICD-10-CM | POA: Diagnosis not present

## 2023-08-27 ENCOUNTER — Emergency Department (HOSPITAL_COMMUNITY): Payer: PPO

## 2023-08-27 ENCOUNTER — Other Ambulatory Visit: Payer: Self-pay

## 2023-08-27 ENCOUNTER — Encounter (HOSPITAL_COMMUNITY): Payer: Self-pay

## 2023-08-27 ENCOUNTER — Emergency Department (HOSPITAL_COMMUNITY)
Admission: EM | Admit: 2023-08-27 | Discharge: 2023-08-27 | Disposition: A | Discharge status: Home / Self Care | Payer: PPO | Attending: Student | Admitting: Student

## 2023-08-27 DIAGNOSIS — R519 Headache, unspecified: Secondary | ICD-10-CM | POA: Insufficient documentation

## 2023-08-27 DIAGNOSIS — Z87891 Personal history of nicotine dependence: Secondary | ICD-10-CM | POA: Diagnosis not present

## 2023-08-27 DIAGNOSIS — Z96642 Presence of left artificial hip joint: Secondary | ICD-10-CM | POA: Diagnosis not present

## 2023-08-27 DIAGNOSIS — Z86011 Personal history of benign neoplasm of the brain: Secondary | ICD-10-CM | POA: Diagnosis not present

## 2023-08-27 DIAGNOSIS — Z7901 Long term (current) use of anticoagulants: Secondary | ICD-10-CM | POA: Diagnosis not present

## 2023-08-27 LAB — COMPREHENSIVE METABOLIC PANEL
ALT: 23 U/L (ref 0–44)
AST: 40 U/L (ref 15–41)
Albumin: 4.4 g/dL (ref 3.5–5.0)
Alkaline Phosphatase: 52 U/L (ref 38–126)
Anion gap: 10 (ref 5–15)
BUN: 19 mg/dL (ref 8–23)
CO2: 25 mmol/L (ref 22–32)
Calcium: 9.5 mg/dL (ref 8.9–10.3)
Chloride: 108 mmol/L (ref 98–111)
Creatinine, Ser: 0.73 mg/dL (ref 0.44–1.00)
GFR, Estimated: >60 mL/min (ref >60)
Glucose, Bld: 97 mg/dL (ref 70–99)
Potassium: 4 mmol/L (ref 3.5–5.1)
Sodium: 143 mmol/L (ref 135–145)
Total Bilirubin: 1.2 mg/dL (ref 0.0–1.2)
Total Protein: 6.8 g/dL (ref 6.5–8.1)

## 2023-08-27 LAB — CBC WITH DIFFERENTIAL/PLATELET
Abs Immature Granulocytes: 0.01 10*3/uL (ref 0.00–0.07)
Basophils Absolute: 0 10*3/uL (ref 0.0–0.1)
Basophils Relative: 0 %
Eosinophils Absolute: 0 10*3/uL (ref 0.0–0.5)
Eosinophils Relative: 1 %
HCT: 45.7 % (ref 36.0–46.0)
Hemoglobin: 15 g/dL (ref 12.0–15.0)
Immature Granulocytes: 0 %
Lymphocytes Relative: 33 %
Lymphs Abs: 1.8 10*3/uL (ref 0.7–4.0)
MCH: 33 pg (ref 26.0–34.0)
MCHC: 32.8 g/dL (ref 30.0–36.0)
MCV: 100.7 fL — ABNORMAL HIGH (ref 80.0–100.0)
Monocytes Absolute: 0.6 10*3/uL (ref 0.1–1.0)
Monocytes Relative: 12 %
Neutro Abs: 3 10*3/uL (ref 1.7–7.7)
Neutrophils Relative %: 54 %
Platelets: 146 10*3/uL — ABNORMAL LOW (ref 150–400)
RBC: 4.54 MIL/uL (ref 3.87–5.11)
RDW: 11.8 % (ref 11.5–15.5)
WBC: 5.5 10*3/uL (ref 4.0–10.5)
nRBC: 0 % (ref 0.0–0.2)

## 2023-08-27 LAB — C-REACTIVE PROTEIN: CRP: <0.5 mg/dL (ref <1.0)

## 2023-08-27 LAB — SEDIMENTATION RATE: Sed Rate: 2 mm/h (ref 0–22)

## 2023-08-27 MED ORDER — PROCHLORPERAZINE MALEATE 10 MG PO TABS
10.0000 mg | ORAL_TABLET | Freq: Three times a day (TID) | ORAL | 0 refills | Status: DC | PRN
  Filled 2023-08-27: qty 10, 4d supply, fill #0

## 2023-08-27 MED ORDER — GADOBUTROL 1 MMOL/ML IV SOLN
6.0000 mL | Freq: Once | INTRAVENOUS | Status: AC | PRN
  Administered 2023-08-27: 6 mL via INTRAVENOUS

## 2023-08-27 MED ORDER — DIPHENHYDRAMINE HCL 25 MG PO TABS
25.0000 mg | ORAL_TABLET | Freq: Three times a day (TID) | ORAL | 0 refills | Status: DC | PRN
  Filled 2023-08-27 – 2023-08-28 (×2): qty 10, 4d supply, fill #0

## 2023-08-27 NOTE — ED Provider Triage Note (Signed)
Emergency Medicine Provider Triage Evaluation Note  Shelia [name] , a 83 y.o. female  was evaluated in triage.  Pt complains of a headache that started last week.  This headache is on and off and does not respond to Tylenol. Denies head sensitivity or nausea.  Denies any history of malignancy, fever or chills.  Denies any neck stiffness.  This is a new onset headache for the patient, no history of migraines or headaches.  She does have a history of meningioma.  Recommended to come here for sister who is a neurologist for an MRI brain and ESR and CRP.Marland Kitchen  Review of Systems  Positive: As above Negative: As above  Physical Exam  BP 136/79 (BP Location: Right Arm)   Pulse 71   Temp 98.9 F (37.2 C) (Oral)   Resp 16   Ht 5\' 5"  (1.651 m)   Wt 62 kg   SpO2 100%   BMI 22.75 kg/m  Gen:   Awake, no distress   Resp:  Normal effort  MSK:   Moves extremities without difficulty    Medical Decision Making  Medically screening exam initiated at 3:19 PM.  Appropriate orders placed.  KALYIAH SAINTIL was informed that the remainder of the evaluation will be completed by another provider, this initial triage assessment does not replace that evaluation, and the importance of remaining in the ED until their evaluation is complete.     Arabella Merles, PA-C 08/27/23 1521

## 2023-08-27 NOTE — ED Notes (Signed)
Pt was transported to scan

## 2023-08-27 NOTE — ED Triage Notes (Signed)
Patient stated she has been in the ED twice for a headache. Head pain is on and off. Denies light sensitivity or nausea. Stated her CT was negative. Her family member recommended to get MRI and blood work.

## 2023-08-27 NOTE — ED Notes (Signed)
Provider in room

## 2023-08-27 NOTE — ED Provider Notes (Signed)
[name] [facility] DEPARTMENT AT [facility] Provider Note  CSN: [id number] Arrival date & time: 08/27/23 1352  Chief Complaint(s) Headache  HPI Shelia [name] is a 83 y.o. female with PMH meningioma who presents emergency room for evaluation of a headache.  She states that over the last 1 week she has had persistent intermittent headaches that come and go.  Worse on the right side and radiate to the back of the occiput.  No associated vision loss, numbness tingling, weakness or other neurologic complaints.  Was seen in the emergency department on 08/20/2023 and had negative CT angiography.  Followed up outpatient with her primary care physician on 08/25/2023 who discussed appropriate Tylenol regimen.  States that last night symptoms were severe and she spoke with a family member who is a family medicine physician who recommended that she come to the emergency department for an MRI with and without contrast.  Here in the emergency room, she is asymptomatic and currently does not have a headache.  Currently denies any additional symptoms including chest pain, shortness of breath, abdominal pain, nausea, vomiting, numbness, tingling, weakness or other systemic symptoms.   Past Medical History Past Medical History:  Diagnosis Date   Anxiety    Arthritis    "left knee; left hip; lower back; mild to moderate in right hip" (09/30/2016)   Atrial fibrillation (HCC)    Chronic lower back pain    Gastritis    Heart palpitations    History of bone density study 2018   History of mammogram 2021   History of MRI 2018   History of Papanicolaou smear of cervix    Hypercholesteremia    Osteopenia    Seasonal allergies    Patient Active Problem List   Diagnosis Date Noted   Meningioma (HCC) 08/03/2023   Persistent atrial fibrillation (HCC) 05/20/2023   Neutropenia, unspecified type (HCC) 12/23/2022   Alcohol dependence, 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   Thrombocytopenia (HCC)  09/13/2021   Leukopenia 09/13/2021   Elevated liver enzymes 09/13/2021   Hypercoagulable state due to persistent atrial fibrillation (HCC) 07/25/2021   Paroxysmal A-fib (HCC)    Primary osteoarthritis of left hip 09/29/2016   Urinary incontinence 06/25/2016   Lumbar radiculopathy 06/18/2016   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 Authorizing Provider  acetaminophen (TYLENOL) 325 MG tablet Take 325 mg by mouth every 6 (six) hours as needed for moderate pain or mild pain.    [provider]  amoxicillin (AMOXIL) 500 MG capsule Take 4 capsules (2,000 mg total) by mouth 1 hour prior to appointment. Patient not taking: Reported on 08/25/2023 03/18/23     apixaban (ELIQUIS) 5 MG TABS tablet Take 1 tablet (5 mg total) by mouth 2 (two) times daily. 06/22/23   Camnitz, Andree Coss, MD  Brimonidine Tartrate (LUMIFY) 0.025 % SOLN Place 1 drop into both eyes daily. Red eyes    [provider]  Carboxymeth-Glyc-Polysorb PF (REFRESH OPTIVE MEGA-3) 0.5-1-0.5 % SOLN Place 1 drop into both eyes 4 (four) times daily as needed (dry eyes). Enhance with flax seed oil  [provider]  diltiazem (CARDIZEM) 30 MG tablet Take 1 tablet every 4 hours AS NEEDED for AFIB heart rate >100 as long as top BP >100. Patient not taking: Reported on 08/25/2023 05/27/23   Fenton, Clint R, PA  hydrocortisone cream 0.5 % Apply 1 Application topically 2 (two) times daily as needed for itching.    [provider]  ketotifen (ZADITOR) 0.025 % ophthalmic solution Place 1 drop into both eyes 2 (two) times  daily as needed (itchy eyes). In the fall    [provider]  loratadine (CLARITIN) 10 MG tablet Take 10 mg by mouth daily as needed for allergies.    [provider]  mineral oil-hydrophilic petrolatum (AQUAPHOR) ointment Apply 1 Application topically as needed for dry skin. Patient not taking: Reported on 08/25/2023    [provider]  Multiple Vitamin (ONE-DAILY MULTI VITAMINS PO) Take 30 mLs by mouth daily. [name] liquid vitamin    [provider]  NON FORMULARY Take 1,000 mg by mouth daily. Plant calcium    [provider]  OVER THE COUNTER MEDICATION Place 1 application  into both eyes at bedtime. Pure & Clean Patient not taking: Reported on 08/25/2023    [provider]  rosuvastatin (CRESTOR) 10 MG tablet Take 1 tablet (10 mg total) by mouth daily. 10/28/22   Lewayne Bunting, MD  triamcinolone cream (KENALOG) 0.1 % Apply 1 Application topically 2 (two) times daily as needed (itching).    [provider]  Vitamins C E (CRANBERRY URINARY COMFORT PO) Take by mouth.    [provider]                                                                                                                                    Past Surgical History Past Surgical History:  Procedure Laterality Date   ATRIAL FIBRILLATION ABLATION N/A 06/10/2022   Procedure: ATRIAL FIBRILLATION ABLATION;  Surgeon: Regan Lemming, MD;  Location: [facility] INVASIVE CV LAB;  Service: Cardiovascular;  Laterality: N/A;   CARDIOVERSION N/A 11/17/2019   Procedure: CARDIOVERSION;  Surgeon: Lewayne Bunting, MD;  Location: [facility] ENDOSCOPY;  Service: Cardiovascular;  Laterality: N/A;   CARDIOVERSION N/A 04/16/2021   Procedure: CARDIOVERSION;  Surgeon: Chilton Si, MD;  Location: [facility] ENDOSCOPY;  Service: Cardiovascular;  Laterality: N/A;   CARDIOVERSION N/A 08/15/2021   Procedure: CARDIOVERSION;  Surgeon: Maisie Fus, MD;  Location: [facility] ENDOSCOPY;  Service:  Cardiovascular;  Laterality: N/A;   CARDIOVERSION N/A 05/25/2023   Procedure: CARDIOVERSION;  Surgeon: Chrystie Nose, MD;  Location: [facility] INVASIVE CV LAB;  Service: Cardiovascular;  Laterality: N/A;   COLONOSCOPY     DILATION AND CURETTAGE OF UTERUS     JOINT REPLACEMENT     TONSILLECTOMY     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   Procedure: TOTAL HIP ARTHROPLASTY ANTERIOR APPROACH;  Surgeon: Jodi Geralds, MD;  Location: [facility] OR;  Service:  Orthopedics;  Laterality: Left;   Family History Family History  Problem Relation Age of Onset   Hypertension Mother    Cancer Father        lung cancer   Diabetes Sister    Anxiety disorder Daughter    Breast cancer Neg Hx     Social History Social History   Tobacco Use   Smoking status: Former    Current packs/day: 2.00    Types: Cigarettes   Smokeless tobacco: Never   Tobacco comments:    Former smoker 07/08/22  Vaping Use   Vaping status: Never Used  Substance Use Topics   Alcohol use: Not Currently   Drug use: Never   Allergies Patient has no known allergies.  Review of Systems Review of Systems  Neurological:  Positive for headaches.    Physical Exam Vital Signs  I have reviewed the triage vital signs BP 138/75   Pulse (!) 57   Temp 97.6 F (36.4 C) (Oral)   Resp 16   Ht 5\' 5"  (1.651 m)   Wt 62 kg   SpO2 100%   BMI 22.75 kg/m   Physical Exam Vitals and nursing note reviewed.  Constitutional:      General: She is not in acute distress.    Appearance: She is well-developed.  HENT:     Head: Normocephalic and atraumatic.  Eyes:     Conjunctiva/sclera: Conjunctivae normal.  Cardiovascular:     Rate and Rhythm: Normal rate and regular rhythm.     Heart sounds: No murmur heard. Pulmonary:     Effort: Pulmonary effort is normal. No respiratory distress.     Breath sounds: Normal breath sounds.  Abdominal:     Palpations: Abdomen is soft.     Tenderness: There is no abdominal tenderness.  Musculoskeletal:         General: No swelling.     Cervical back: Neck supple.  Skin:    General: Skin is warm and dry.     Capillary Refill: Capillary refill takes less than 2 seconds.  Neurological:     Mental Status: She is alert.  Psychiatric:        Mood and Affect: Mood normal.     ED Results and Treatments Labs (all labs ordered are listed, but only abnormal results are displayed) Labs Reviewed  CBC WITH DIFFERENTIAL/PLATELET - Abnormal; Notable for the following components:      Result Value   MCV 100.7 (*)    Platelets 146 (*)    All other components within normal limits  COMPREHENSIVE METABOLIC PANEL  SEDIMENTATION RATE  C-REACTIVE PROTEIN                                                                                                                          Radiology No results found.  Pertinent labs & imaging results that were available during my care of the patient were reviewed by me and considered in my medical decision making (see  MDM for details).  Medications Ordered in ED Medications  gadobutrol (GADAVIST) 1 MMOL/ML injection 6 mL (6 mLs Intravenous Contrast Given 08/27/23 1831)                                                                                                                                     Procedures Procedures  (including critical care time)  Medical Decision Making / ED Course   This patient presents to the ED for concern of headache, this involves an extensive number of treatment options, and is a complaint that carries with it a high risk of complications and morbidity.  The differential diagnosis includes migraine, Cluster, Tension Ha, Dural venous thrombosis, Sinusitis, CO poisoning, HTN, Malignancy  MDM: Patient seen emergency room for evaluation of headache.  Physical exam is reassuringly unremarkable with no focal motor or sensory deficits.  No cranial nerve deficits.  No tenderness over the temporal bones bilaterally.  Laboratory evaluation is  unremarkable including negative ESR and CRP.  MRI brain with and without reassuringly unremarkable and shows the 1.5 cm left frontal meningioma with no significant mass effect or brain edema as well as frothy secretions in the left maxillary sinus.  Patient has not had any pain or pressure over the maxillary sinuses and this is likely an incidental finding.  I had an extensive discussion with the patient about headache management and we came up with a plan to use an oral headache cocktail as needed with Compazine and Benadryl and I will place an outpatient referral to [facility] neurology to speak with a headache specialist.  I did indicate to the patient that she would benefit from seeing an outpatient neurologist as this as needed therapy is a temporary measure and is not traditionally used for long-term headache management and could potentially be a fall risk if used frequently.  Patient currently is asymptomatic here in the emergency department and did not want any medication while here which is not unreasonable.  CTA angiography was performed on 08/20/2023 and I have low suspicion for aneurysm or sentinel bleeding/undiagnosed SAH.  at this time she does not meet inpatient criteria for admission and will be discharged with outpatient follow-up.   Additional history obtained: -Additional history obtained from husband -External records from outside source obtained and reviewed including: Chart review including previous notes, labs, imaging, consultation notes   Lab Tests: -I ordered, reviewed, and interpreted labs.   The pertinent results include:   Labs Reviewed  CBC WITH DIFFERENTIAL/PLATELET - Abnormal; Notable for the following components:      Result Value   MCV 100.7 (*)    Platelets 146 (*)    All other components within normal limits  COMPREHENSIVE METABOLIC PANEL  SEDIMENTATION RATE  C-REACTIVE PROTEIN      Imaging Studies ordered: I ordered imaging studies including MRI brain with  and without I independently visualized and interpreted imaging. I agree with the radiologist interpretation  Medicines ordered and prescription drug management: Meds ordered this encounter  Medications   gadobutrol (GADAVIST) 1 MMOL/ML injection 6 mL    -I have reviewed the patients home medicines and have made adjustments as needed  Critical interventions none    Cardiac Monitoring: The patient was maintained on a cardiac monitor.  I personally viewed and interpreted the cardiac monitored which showed an underlying rhythm of: NSR  Social Determinants of Health:  Factors impacting patients care include: none   Reevaluation: After the interventions noted above, I reevaluated the patient and found that they have :improved  Co morbidities that complicate the patient evaluation  Past Medical History:  Diagnosis Date   Anxiety    Arthritis    "left knee; left hip; lower back; mild to moderate in right hip" (09/30/2016)   Atrial fibrillation (HCC)    Chronic lower back pain    Gastritis    Heart palpitations    History of bone density study 2018   History of mammogram 2021   History of MRI 2018   History of Papanicolaou smear of cervix    Hypercholesteremia    Osteopenia    Seasonal allergies       Dispostion: I considered admission for this patient, but at this time she does not meet inpatient criteria for admission and will be discharged with outpatient follow-up     Final Clinical Impression(s) / ED Diagnoses Final diagnoses:  None     @PCDICTATION @    Glendora Score, MD 08/28/23 1200

## 2023-08-28 ENCOUNTER — Other Ambulatory Visit: Payer: Self-pay

## 2023-08-28 ENCOUNTER — Other Ambulatory Visit (HOSPITAL_COMMUNITY): Payer: Self-pay

## 2023-08-29 ENCOUNTER — Other Ambulatory Visit (HOSPITAL_COMMUNITY): Payer: Self-pay

## 2023-08-31 ENCOUNTER — Telehealth: Payer: PPO | Admitting: Physician Assistant

## 2023-08-31 ENCOUNTER — Other Ambulatory Visit (HOSPITAL_COMMUNITY): Payer: Self-pay

## 2023-08-31 DIAGNOSIS — R3989 Other symptoms and signs involving the genitourinary system: Secondary | ICD-10-CM | POA: Diagnosis not present

## 2023-08-31 MED ORDER — CEPHALEXIN 500 MG PO CAPS
500.0000 mg | ORAL_CAPSULE | Freq: Two times a day (BID) | ORAL | 0 refills | Status: DC
  Filled 2023-08-31: qty 14, 7d supply, fill #0

## 2023-08-31 NOTE — Progress Notes (Signed)

## 2023-09-02 ENCOUNTER — Ambulatory Visit: Payer: PPO | Admitting: Neurology

## 2023-09-02 ENCOUNTER — Encounter: Payer: Self-pay | Admitting: Neurology

## 2023-09-02 VITALS — BP 126/69 | HR 66 | Ht 67.0 in | Wt 137.2 lb

## 2023-09-02 DIAGNOSIS — R519 Headache, unspecified: Secondary | ICD-10-CM

## 2023-09-02 DIAGNOSIS — J320 Chronic maxillary sinusitis: Secondary | ICD-10-CM | POA: Diagnosis not present

## 2023-09-02 DIAGNOSIS — I4819 Other persistent atrial fibrillation: Secondary | ICD-10-CM

## 2023-09-02 DIAGNOSIS — Z9189 Other specified personal risk factors, not elsewhere classified: Secondary | ICD-10-CM | POA: Diagnosis not present

## 2023-09-02 DIAGNOSIS — J3489 Other specified disorders of nose and nasal sinuses: Secondary | ICD-10-CM | POA: Diagnosis not present

## 2023-09-02 NOTE — Progress Notes (Signed)
 Subjective:    Patient ID: Shelia [name] is a 83 y.o. female.  HPI    Interim history:    Dear Shelia [name],   I saw your patient, Shelia [name], upon your kind request in my neurologic clinic today for initial consultation of her recurrent headaches. The patient is accompanied by her husband today.  As you know, Shelia [name] is an 83 year old female with an underlying medical history of cerebral meningioma, atrial fibrillation, on Eliquis , status post cardioversion and cardiac ablation, allergies, osteopenia, hyperlipidemia, arthritis, and anxiety, who reports recurrent headaches for the past approximately 2 weeks.  She has had some nasal drainage from the right side intermittently.  Headaches are intermittent, sometimes worse at night, sometimes she has trouble sleeping through the night.  She does not have a prior history of headaches or migraines.  She denies any sudden onset of one-sided weakness or numbness or tingling or droopy face or slurring of speech, no recent falls.  She has increased her water intake, she does not drink caffeine daily.  She does not currently consume any alcohol.  She had a recent UTI.  She has not been treated for a sinus infection.  She has never had a sleep study.  She has some trouble staying asleep, no significant snoring reported.  She did denies any photophobia or nausea or vomiting.  She did get a prescription for Compazine  and Benadryl  through the ER and has taken these medicines at night without any significant improvement.  She has used Tylenol  over-the-counter.  I reviewed your office note from 08/25/2023.  She recently presented to the emergency room on 08/20/2023 as well as 08/27/2023 and I reviewed the emergency room records.  She had a brain MRI with and without contrast on 08/27/2023 and I reviewed the results:   IMPRESSION: 1. No evidence of acute intracranial abnormality. 2. Approximately 1.5 cm left frontal convexity putative meningioma without  significant mass effect or adjacent brain edema. 3. Left maxillary sinus frothy secretions.     She had a CT angiogram of the head and neck with and without contrast on 08/20/2023 and I reviewed the results:   IMPRESSION: 1. Normal CTA of the head and neck. No large vessel occlusion, hemodynamically significant stenosis, or other acute vascular abnormality. 2. Stable calcified meningioma over the left frontal convexity. 3. Mild atrophy and white matter changes are stable, likely within normal limits for age. 4.  Aortic Atherosclerosis (ICD10-I70.0).  Laboratory testing from 08/27/2023 showed normal CMP, CBC showed elevated MCV at 100.7, borderline, reduced platelets at 146, mildly below normal.  ESR was 2, CRP less than 0.5.    I had evaluated her for her meningioma in 2024 and referred her to neurosurgery.   She saw a neurosurgeon through [facility] in May 2024 and had a repeat brain MRI with and without contrast through [facility] on 02/26/2023 and I reviewed the results: Impression: Left frontal convexity meningioma measures 1.4 x 1.3 x 1 cm.  She was advised to have yearly brain MRI scans and her birthday month for ease of remembering.  Previously:  11/06/2022: 83 year old female with an underlying medical history of atrial fibrillation, on Eliquis , status post cardioversion and cardiac ablation, allergies, osteopenia, hyperlipidemia, arthritis, and anxiety, who reports a couple of falls recently.  These happened earlier this month, she tripped at her daughter's house twice within a week's time.  She did have sustained bruising on her ribs and a black eye on the left.  This is healing thankfully.  She denies any severe or recurrent headaches, no sudden onset of one-sided weakness or numbness or tingling or droopy face or slurring of speech.  She was found to have a meningioma.  She has not had any problems with her balance, no dizziness or lightheadedness, does not walk with a walking  aid.  She hydrates well with water.    I reviewed your office note from 10/29/2022.  She had a recent head CT without contrast on 10/27/2022 and I reviewed the results: IMPRESSION: 1. No evidence of acute intracranial hemorrhage or acute infarct. 2. 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 The mass mildly indents the underlying left frontal lobe without underlying parenchymal edema. 3. Mild chronic small vessel ischemic changes within the cerebral white matter. 4. Mild generalized cerebral atrophy.   She was notified of the test results but has not been referred to neurosurgery.   She has regular eye examinations, she is status post cataract surgeries, her daughter is an optometrist and she has another daughter who is a neurologist in [place], [place] .      Her Past Medical History Is Significant For: Past Medical History:  Diagnosis Date   Anxiety    Arthritis    left knee; left hip; lower back; mild to moderate in right hip (09/30/2016)   Atrial fibrillation (HCC)    Chronic lower back pain    Gastritis    Heart palpitations    History of bone density study 2018   History of mammogram 2021   History of MRI 2018   History of Papanicolaou smear of cervix    Hypercholesteremia    Osteopenia    Seasonal allergies     Her Past Surgical History Is Significant For: Past Surgical History:  Procedure Laterality Date   ATRIAL FIBRILLATION ABLATION N/A 06/10/2022   Procedure: ATRIAL FIBRILLATION ABLATION;  Surgeon: Inocencio Soyla Lunger, MD;  Location: [facility] INVASIVE CV LAB;  Service: Cardiovascular;  Laterality: N/A;   CARDIOVERSION N/A 11/17/2019   Procedure: CARDIOVERSION;  Surgeon: Pietro Redell RAMAN, MD;  Location: [facility] ENDOSCOPY;  Service: Cardiovascular;  Laterality: N/A;   CARDIOVERSION N/A 04/16/2021   Procedure: CARDIOVERSION;  Surgeon: Raford Riggs, MD;  Location: [facility] ENDOSCOPY;  Service: Cardiovascular;  Laterality: N/A;   CARDIOVERSION N/A 08/15/2021    Procedure: CARDIOVERSION;  Surgeon: Alvan Ronal BRAVO, MD;  Location: [facility] ENDOSCOPY;  Service: Cardiovascular;  Laterality: N/A;   CARDIOVERSION N/A 05/25/2023   Procedure: CARDIOVERSION;  Surgeon: Mona Vinie BROCKS, MD;  Location: [facility] INVASIVE CV LAB;  Service: Cardiovascular;  Laterality: N/A;   COLONOSCOPY     DILATION AND CURETTAGE OF UTERUS     JOINT REPLACEMENT     TONSILLECTOMY     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   Procedure: TOTAL HIP ARTHROPLASTY ANTERIOR APPROACH;  Surgeon: Yvone Rush, MD;  Location: [facility] OR;  Service: Orthopedics;  Laterality: Left;    Her Family History Is Significant For: Family History  Problem Relation Age of Onset   Hypertension Mother    Cancer Father        lung cancer   Diabetes Sister    Anxiety disorder Daughter    Breast cancer Neg Hx     Her Social History Is Significant For: Social History   Socioeconomic History   Marital status: Married    Spouse name: Not on file   Number of children: 3   Years of education: Not on file   Highest education level: Not on file  Occupational History   Not on file  Tobacco Use   Smoking status: Former    Current packs/day: 2.00    Types: Cigarettes   Smokeless tobacco: Never   Tobacco comments:    Former smoker 07/08/22  Vaping Use   Vaping status: Never Used  Substance and Sexual Activity   Alcohol use: Not Currently   Drug use: Never   Sexual activity: Yes  Other Topics Concern   Not on file  Social History Narrative   Tobacco use, amount per day now: None.   Past tobacco use, amount per day: Maximum 2 packs    How many years did you use tobacco: Quit 1985   Alcohol use (drinks per week): 3   Diet: Vegetables, Chicken, Fish, Grains, and Fruit.   Do you drink/eat things with caffeine: Coffee   Marital status: Married                                  What year were you married? 1969   Do you live in a house, apartment, assisted living, condo, trailer, etc.? House.   Is it one or more  stories? 2 stories.   How many persons live in your home? 2   Do you have pets in your home?( please list) No.   Highest Level of education completed? Masters in Adult Educations.   Current or past profession: Art Gallery Manager    Do you exercise? Yes.                                  Type and how often? Floor excercises 5 times week. Walk 2 miles 6 times week.   Do you have a living will? Yes   Do you have a DNR form?  Yes                                 If not, do you want to discuss one?   Do you have signed POA/HPOA forms?  Yes                      If so, please bring to you appointment      Do you have any difficulty bathing or dressing yourself? No   Do you have any difficulty preparing food or eating? No   Do you have any difficulty managing your medications? No   Do you have any difficulty managing your finances? No   Do you have any difficulty affording your medications? No   Social Drivers of Corporate Investment Banker Strain: Not on file  Food Insecurity: No Food Insecurity (05/01/2023)   Hunger Vital Sign    Worried About Running Out of Food in the Last Year: Never true    Ran Out of Food in the Last Year: Never true  Transportation Needs: No Transportation Needs (05/01/2023)   PRAPARE - Administrator, Civil Service (Medical): No    Lack of Transportation (Non-Medical): No  Physical Activity: Not on file  Stress: Stress Concern Present (05/01/2023)   [employer] of Occupational Health - Occupational Stress Questionnaire    Feeling of Stress : To some extent  Social Connections: Unknown (11/12/2022)   [facility]   Social Network  Social Network: Not on file    Her Allergies Are:  No Known Allergies:   Her Current Medications Are:  Outpatient Encounter Medications as of 09/02/2023  Medication Sig   acetaminophen  (TYLENOL ) 325 MG tablet Take 325 mg by mouth every 6 (six) hours as needed for moderate pain or mild pain.    apixaban  (ELIQUIS ) 5 MG TABS tablet Take 1 tablet (5 mg total) by mouth 2 (two) times daily.   Brimonidine Tartrate (LUMIFY) 0.025 % SOLN Place 1 drop into both eyes daily. Red eyes   Carboxymeth-Glyc-Polysorb PF (REFRESH OPTIVE MEGA-3) 0.5-1-0.5 % SOLN Place 1 drop into both eyes 4 (four) times daily as needed (dry eyes). Enhance with flax seed oil   cephALEXin  (KEFLEX ) 500 MG capsule Take 1 capsule (500 mg total) by mouth 2 (two) times daily.   diphenhydrAMINE  (BENADRYL ) 25 MG tablet Take 1 tablet (25 mg total) by mouth every 8 (eight) hours as needed (headache).   hydrocortisone  cream 0.5 % Apply 1 Application topically 2 (two) times daily as needed for itching.   ketotifen (ZADITOR) 0.025 % ophthalmic solution Place 1 drop into both eyes 2 (two) times daily as needed (itchy eyes). In the fall   loratadine (CLARITIN) 10 MG tablet Take 10 mg by mouth daily as needed for allergies.   Multiple Vitamin (ONE-DAILY MULTI VITAMINS PO) Take 30 mLs by mouth daily. Ronal Dines liquid vitamin   NON FORMULARY Take 1,000 mg by mouth daily. Plant calcium    prochlorperazine  (COMPAZINE ) 10 MG tablet Take 1 tablet (10 mg total) by mouth every 8 (eight) hours as needed (headache).   rosuvastatin  (CRESTOR ) 10 MG tablet Take 1 tablet (10 mg total) by mouth daily.   triamcinolone cream (KENALOG) 0.1 % Apply 1 Application topically 2 (two) times daily as needed (itching).   Vitamins C E (CRANBERRY URINARY COMFORT PO) Take by mouth.   amoxicillin  (AMOXIL ) 500 MG capsule Take 4 capsules (2,000 mg total) by mouth 1 hour prior to appointment. (Patient not taking: Reported on 08/25/2023)   diltiazem  (CARDIZEM ) 30 MG tablet Take 1 tablet every 4 hours AS NEEDED for AFIB heart rate >100 as long as top BP >100. (Patient not taking: Reported on 09/02/2023)   mineral oil-hydrophilic petrolatum (AQUAPHOR) ointment Apply 1 Application topically as needed for dry skin. (Patient not taking: Reported on 08/25/2023)   OVER THE COUNTER  MEDICATION Place 1 application  into both eyes at bedtime. Pure & Clean (Patient not taking: Reported on 08/25/2023)   No facility-administered encounter medications on file as of 09/02/2023.  :  Review of Systems:  Out of a complete 14 point review of systems, all are reviewed and negative with the exception of these symptoms as listed below:  Review of Systems  Neurological:        Pt here for headaches.  Started 2 weeks ago. R side travels.  Been to ED twice CT and MRI negative. No hx of headaches/migraines. Starts R temporal, last night worse level 10. Took compazine .benadryl , was gone this am after sleeping.  Comes on anytime.  No N/V , no sensitivity to light or sounds. Generally has R nasal drip.     Objective:  Neurological Exam  Physical Exam Physical Examination:   Vitals:   09/02/23 1123  BP: 126/69  Pulse: 66  SpO2: 98%    General Examination: The patient is a very pleasant 83 y.o. female in no acute distress. She appears well-developed and well-nourished and well groomed.   HEENT: Normocephalic, atraumatic,  pupils are equal, round and reactive to light, extraocular tracking is good without limitation to gaze excursion or nystagmus noted.  No photophobia.  Status post bilateral cataract repairs.  Hearing is grossly intact to tuning fork. Face is symmetric with normal facial animation and normal facial sensation to light touch, temperature and vibration sense. Speech is clear with no dysarthria noted. There is no hypophonia. There is no lip, neck/head, jaw or voice tremor. Neck is supple with full range of passive and active motion. There are no carotid bruits on auscultation. Oropharynx exam reveals: Mild to moderate mouth dryness, good dental hygiene. Tongue protrudes centrally and palate elevates symmetrically.  Tonsils absent, small airway entry, Mallampati class I.     Chest: Clear to auscultation without wheezing, rhonchi or crackles noted.   Heart: S1+S2+0, perhaps  slightly irregular, no murmur.  Abdomen: Soft, non-tender and non-distended.   Extremities: There is no pitting edema in the distal lower extremities bilaterally.    Skin: Warm and dry without trophic changes noted.    Musculoskeletal: exam reveals no obvious joint deformities.    Neurologically:  Mental status: The patient is awake, alert and oriented in all 4 spheres. Her immediate and remote memory, attention, language skills and fund of knowledge are appropriate. There is no evidence of aphasia, agnosia, apraxia or anomia. Speech is clear with normal prosody and enunciation. Thought process is linear. Mood is normal and affect is normal.  Cranial nerves II - XII are as described above under HEENT exam.  Motor exam: Normal bulk, strength and tone is noted. There is no obvious action or resting tremor.  No drift or rebound. Fine motor skills and coordination: Normal finger taps, normal hand movements and rapid alternating patting in both upper extremities, normal foot taps bilaterally.  Reflexes 1+ in the upper extremities, trace in the lower extremities.  Toes are downgoing bilaterally. Cerebellar testing: No dysmetria or intention tremor. There is no truncal or gait ataxia.  Normal finger-to-nose and normal heel-to-shin bilaterally. Sensory exam: intact to light touch, temperature and vibration sense in the upper and lower extremities.  Gait, station and balance: She stands easily. No veering to one side is noted. No leaning to one side is noted. Posture is age-appropriate and stance is narrow based. Gait shows normal stride length and normal pace. No problems turning are noted.    Assessment and Plan:  In summary, Shelia [name] is an 83 year old female with an underlying medical history of meningioma, atrial fibrillation, on Eliquis , status post cardioversion and cardiac ablation, allergies, osteopenia, hyperlipidemia, arthritis, and anxiety, who presents for evaluation of her new  onset headaches of approximately 2 weeks duration.  Headache is not classic migraine-like.  She had appropriate imaging tests and labs, unlikely temporal arteritis.  She may have an underlying and untreated left maxillary sinus infection which may be worth treating.  No obvious triggers or alleviating factors.  She reports that she had an updated eye exam about 2 weeks ago with her daughter who is an optometrist.  She has increased her water intake.  She does not drink caffeine daily.  She is advised to follow-up with your office to discuss the possibility of treating her sinusitis.  In addition, I would like to proceed with a sleep study to evaluate her for obstructive sleep apnea especially in light of her nocturnal headaches and history of A-fib.  Neurological exam is nonfocal and reassuring.  She can continue Compazine  and Benadryl  as needed at night but I would  not recommend these medicines long-term.  She is advised to schedule a follow-up appointment with your office.  We will be in touch to schedule her sleep study and we will keep her posted as to her results.  If she has obstructive sleep apnea I will likely recommend AutoPap therapy at home.  We will plan a follow-up accordingly.  This was an extended visit of over 60 minutes with copious record review involved including personal review of imaging tests and considerable counseling and coordination of care. Thank you very much for allowing me to participate in the care of this nice patient. If I can be of any further assistance to you please do not hesitate to call me at [phone].   Sincerely,     True Mar, MD, PhD

## 2023-09-02 NOTE — Patient Instructions (Signed)
 It was nice to see you today.   Here is what we discussed today and my recommendations for you:   Please remember, common headache triggers are: sleep deprivation, dehydration, overheating, stress, hypoglycemia or skipping meals and blood sugar fluctuations, excessive pain medications or excessive alcohol use or caffeine withdrawal. Some people have food triggers such as aged cheese, orange juice or chocolate, especially dark chocolate, or MSG (monosodium glutamate). Try to avoid these headache triggers as much possible. It may be helpful to keep a headache diary to figure out what makes your headaches worse or brings them on and what alleviates them. Some people report headache onset after exercise but studies have shown that regular exercise may actually prevent headaches from coming. If you have exercise-induced headaches, please make sure that you drink plenty of fluid before and after exercising and that you do not over do it and do not overheat. Continue to hydrate well with water, limit caffeine to up to 1 serving per day and avoid alcohol altogether.   We will do a sleep study to look for signs of obstructive sleep apnea (aka OSA). The long-term risks and ramifications of untreated moderate to severe obstructive sleep apnea may include (but are not limited to): increased risk for cardiovascular disease, including congestive heart failure, stroke, difficult to control hypertension, treatment resistant obesity, arrhythmias, especially irregular heartbeat commonly known as A. Fib. (atrial fibrillation); even type 2 diabetes has been linked to untreated OSA.  I would like for you to make a follow-up appointment with your primary care provider to discuss the possibility of treating you for sinus infection as this may be in part the cause of your recurrent and new onset headaches.

## 2023-09-03 ENCOUNTER — Telehealth: Payer: Self-pay

## 2023-09-03 ENCOUNTER — Ambulatory Visit: Payer: PPO | Admitting: Adult Health

## 2023-09-03 ENCOUNTER — Other Ambulatory Visit (HOSPITAL_COMMUNITY): Payer: Self-pay

## 2023-09-03 ENCOUNTER — Encounter: Payer: Self-pay | Admitting: Adult Health

## 2023-09-03 VITALS — BP 124/88 | HR 59 | Temp 97.1°F | Resp 18 | Ht 67.0 in | Wt 138.8 lb

## 2023-09-03 DIAGNOSIS — R319 Hematuria, unspecified: Secondary | ICD-10-CM

## 2023-09-03 DIAGNOSIS — N390 Urinary tract infection, site not specified: Secondary | ICD-10-CM

## 2023-09-03 DIAGNOSIS — J019 Acute sinusitis, unspecified: Secondary | ICD-10-CM

## 2023-09-03 DIAGNOSIS — B9689 Other specified bacterial agents as the cause of diseases classified elsewhere: Secondary | ICD-10-CM | POA: Diagnosis not present

## 2023-09-03 DIAGNOSIS — R519 Headache, unspecified: Secondary | ICD-10-CM | POA: Diagnosis not present

## 2023-09-03 DIAGNOSIS — I480 Paroxysmal atrial fibrillation: Secondary | ICD-10-CM | POA: Diagnosis not present

## 2023-09-03 MED ORDER — AMOXICILLIN-POT CLAVULANATE 875-125 MG PO TABS
1.0000 | ORAL_TABLET | Freq: Two times a day (BID) | ORAL | 0 refills | Status: AC
  Filled 2023-09-03: qty 14, 7d supply, fill #0

## 2023-09-03 MED ORDER — SACCHAROMYCES BOULARDII 250 MG PO CAPS
250.0000 mg | ORAL_CAPSULE | Freq: Two times a day (BID) | ORAL | 0 refills | Status: AC
  Filled 2023-09-03 – 2023-09-11 (×2): qty 20, 10d supply, fill #0

## 2023-09-03 NOTE — Telephone Encounter (Signed)
 Patient call today to speak with [name], Dinah C, NP about having serve headaches. Patient has made an appointment to see Inge Mangle, NP today

## 2023-09-03 NOTE — Patient Instructions (Signed)

## 2023-09-03 NOTE — Progress Notes (Signed)
 [facility]  Provider:  Jereld Serum DNP  Code Status:  Full Code   Goals of Care:     08/27/2023    2:09 PM  Advanced Directives  Does Patient Have a Medical Advance Directive? Yes  Type of Estate Agent of [place];Living will     Chief Complaint  Patient presents with   Acute Visit    Serve Headaches/    HPI: Patient is a 83 y.o. female seen today for an acute visit for severe headache.  Discussed the use of AI scribe software for clinical note transcription with the patient, who gave verbal consent to proceed.   YANIRA TOLSMA is an 83 year old female with a history of meningioma and atrial fibrillation who presents with persistent headaches. She is accompanied by her husband.  She has been experiencing persistent headaches for approximately two weeks. Initially intermittent, the headaches have become constant, with the most recent episode starting last night and continuing into today without relief. The pain is described as pounding and is rated as an 8 out of 10 in severity, localized to the right frontal region. She has been using a combination of Compazine  and Benadryl  for relief, but finds this treatment ineffective. No associated nausea or vomiting is reported.  No fever, nasal congestion, or pain in the sinus or nose. She reports drainage from the right side of her nose without any noticeable color. She has been performing exercises found on YouTube, which she feels have helped with drainage. She underwent a CT angiography on January 23, which was negative, and an MRI on January 30, which revealed left maxillary sinus frothy secretions. She visited the emergency department on both occasions due to the severity of her headaches. There is no reported change in her known meningioma.  She is currently taking Cephalexin  500 mg twice a day for a urinary tract infection, with a couple of days remaining in the course. She also takes Eliquis   regularly and diltiazem  as needed for atrial fibrillation, although she has only needed diltiazem  once since her last cardioversion. She does not smoke or consume alcohol and is diligent about drinking water.  No fever, chills, nasal congestion, nausea, vomiting, or swelling in the feet. She reports being 'always chilled' and having a lack of appetite.        Past Medical History:  Diagnosis Date   Anxiety    Arthritis    left knee; left hip; lower back; mild to moderate in right hip (09/30/2016)   Atrial fibrillation (HCC)    Chronic lower back pain    Gastritis    Heart palpitations    History of bone density study 2018   History of mammogram 2021   History of MRI 2018   History of Papanicolaou smear of cervix    Hypercholesteremia    Osteopenia    Seasonal allergies     Past Surgical History:  Procedure Laterality Date   ATRIAL FIBRILLATION ABLATION N/A 06/10/2022   Procedure: ATRIAL FIBRILLATION ABLATION;  Surgeon: Inocencio Soyla Lunger, MD;  Location: [facility] INVASIVE CV LAB;  Service: Cardiovascular;  Laterality: N/A;   CARDIOVERSION N/A 11/17/2019   Procedure: CARDIOVERSION;  Surgeon: Pietro Redell RAMAN, MD;  Location: [facility] ENDOSCOPY;  Service: Cardiovascular;  Laterality: N/A;   CARDIOVERSION N/A 04/16/2021   Procedure: CARDIOVERSION;  Surgeon: Raford Riggs, MD;  Location: [facility] ENDOSCOPY;  Service: Cardiovascular;  Laterality: N/A;   CARDIOVERSION N/A 08/15/2021   Procedure: CARDIOVERSION;  Surgeon: Alvan Ronal FORBES, MD;  Location: [facility] ENDOSCOPY;  Service: Cardiovascular;  Laterality: N/A;   CARDIOVERSION N/A 05/25/2023   Procedure: CARDIOVERSION;  Surgeon: Mona Vinie BROCKS, MD;  Location: [facility] INVASIVE CV LAB;  Service: Cardiovascular;  Laterality: N/A;   COLONOSCOPY     DILATION AND CURETTAGE OF UTERUS     JOINT REPLACEMENT     TONSILLECTOMY     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   Procedure: TOTAL HIP ARTHROPLASTY ANTERIOR APPROACH;  Surgeon: Yvone Rush, MD;  Location: [facility]  OR;  Service: Orthopedics;  Laterality: Left;    No Known Allergies  Outpatient Encounter Medications as of 09/03/2023  Medication Sig   acetaminophen  (TYLENOL ) 325 MG tablet Take 325 mg by mouth every 6 (six) hours as needed for moderate pain or mild pain.   amoxicillin -clavulanate (AUGMENTIN ) 875-125 MG tablet Take 1 tablet by mouth 2 (two) times daily for 7 days.   apixaban  (ELIQUIS ) 5 MG TABS tablet Take 1 tablet (5 mg total) by mouth 2 (two) times daily.   Brimonidine Tartrate (LUMIFY) 0.025 % SOLN Place 1 drop into both eyes daily. Red eyes   Carboxymeth-Glyc-Polysorb PF (REFRESH OPTIVE MEGA-3) 0.5-1-0.5 % SOLN Place 1 drop into both eyes 4 (four) times daily as needed (dry eyes). Enhance with flax seed oil   cephALEXin  (KEFLEX ) 500 MG capsule Take 1 capsule (500 mg total) by mouth 2 (two) times daily.   diphenhydrAMINE  (BENADRYL ) 25 MG tablet Take 1 tablet (25 mg total) by mouth every 8 (eight) hours as needed (headache).   hydrocortisone  cream 0.5 % Apply 1 Application topically 2 (two) times daily as needed for itching.   ketotifen (ZADITOR) 0.025 % ophthalmic solution Place 1 drop into both eyes 2 (two) times daily as needed (itchy eyes). In the fall   loratadine (CLARITIN) 10 MG tablet Take 10 mg by mouth daily as needed for allergies.   Multiple Vitamin (ONE-DAILY MULTI VITAMINS PO) Take 30 mLs by mouth daily. Ronal Dines liquid vitamin   NON FORMULARY Take 1,000 mg by mouth daily. Plant calcium    prochlorperazine  (COMPAZINE ) 10 MG tablet Take 1 tablet (10 mg total) by mouth every 8 (eight) hours as needed (headache).   rosuvastatin  (CRESTOR ) 10 MG tablet Take 1 tablet (10 mg total) by mouth daily.   saccharomyces boulardii (FLORASTOR) 250 MG capsule Take 1 capsule (250 mg total) by mouth 2 (two) times daily for 10 days.   triamcinolone cream (KENALOG) 0.1 % Apply 1 Application topically 2 (two) times daily as needed (itching).   Vitamins C E (CRANBERRY URINARY COMFORT PO) Take by  mouth.   amoxicillin  (AMOXIL ) 500 MG capsule Take 4 capsules (2,000 mg total) by mouth 1 hour prior to appointment. (Patient not taking: Reported on 08/25/2023)   diltiazem  (CARDIZEM ) 30 MG tablet Take 1 tablet every 4 hours AS NEEDED for AFIB heart rate >100 as long as top BP >100. (Patient not taking: Reported on 08/25/2023)   mineral oil-hydrophilic petrolatum (AQUAPHOR) ointment Apply 1 Application topically as needed for dry skin. (Patient not taking: Reported on 08/25/2023)   OVER THE COUNTER MEDICATION Place 1 application  into both eyes at bedtime. Pure & Clean (Patient not taking: Reported on 08/25/2023)   No facility-administered encounter medications on file as of 09/03/2023.    Review of Systems:  Review of Systems  Constitutional:  Negative for appetite change, chills, fatigue and fever.  HENT:  Negative for congestion, hearing loss, rhinorrhea and sore throat.   Eyes: Negative.   Respiratory:  Negative for cough, shortness  of breath and wheezing.   Cardiovascular:  Negative for chest pain, palpitations and leg swelling.  Gastrointestinal:  Negative for abdominal pain, constipation, diarrhea, nausea and vomiting.  Genitourinary:  Negative for dysuria.  Musculoskeletal:  Negative for arthralgias, back pain and myalgias.  Skin:  Negative for color change, rash and wound.  Neurological:  Positive for headaches. Negative for dizziness and weakness.  Psychiatric/Behavioral:  Negative for behavioral problems. The patient is not nervous/anxious.     Health Maintenance  Topic Date Due   COVID-19 Vaccine (8 - 2024-25 season) 08/03/2023   Medicare Annual Wellness (AWV)  04/30/2024   DTaP/Tdap/Td (4 - Td or Tdap) 12/26/2027   Pneumonia Vaccine 63+ Years old  Completed   INFLUENZA VACCINE  Completed   DEXA SCAN  Completed   Zoster Vaccines- Shingrix  Completed   HPV VACCINES  Aged Out   Hepatitis C Screening  Discontinued    Physical Exam: Vitals:   09/03/23 1531  BP: 124/88   Pulse: (!) 59  Resp: 18  Temp: (!) 97.1 F (36.2 C)  SpO2: 98%  Weight: 138 lb 12.8 oz (63 kg)  Height: 5' 7 (1.702 m)   Body mass index is 21.74 kg/m. Physical Exam Constitutional:      Appearance: Normal appearance.  HENT:     Head: Normocephalic and atraumatic.     Nose: Nose normal.     Mouth/Throat:     Mouth: Mucous membranes are moist.  Eyes:     Conjunctiva/sclera: Conjunctivae normal.  Cardiovascular:     Rate and Rhythm: Normal rate and regular rhythm.  Pulmonary:     Effort: Pulmonary effort is normal.     Breath sounds: Normal breath sounds.  Abdominal:     General: Bowel sounds are normal.     Palpations: Abdomen is soft.  Musculoskeletal:        General: Normal range of motion.     Cervical back: Normal range of motion.  Skin:    General: Skin is warm and dry.  Neurological:     General: No focal deficit present.     Mental Status: She is alert and oriented to person, place, and time.  Psychiatric:        Mood and Affect: Mood normal.        Behavior: Behavior normal.        Thought Content: Thought content normal.        Judgment: Judgment normal.     Labs reviewed: Basic Metabolic Panel: Recent Labs    12/18/22 0842 03/19/23 1004 08/11/23 0856 08/20/23 1645 08/27/23 1524  NA 142   < > 145 141 143  K 4.1   < > 4.2 3.9 4.0  CL 107   < > 108 105 108  CO2 29   < > 30 27 25   GLUCOSE 77   < > 74 107* 97  BUN 26*   < > 26* 18 19  CREATININE 0.75   < > 0.77 0.67 0.73  CALCIUM  9.1   < > 9.2 9.7 9.5  TSH 3.36  --  3.37  --   --    < > = values in this interval not displayed.   Liver Function Tests: Recent Labs    03/19/23 1004 08/11/23 0856 08/20/23 1645 08/27/23 1524  AST 37 27 33 40  ALT 28 18 28 23   ALKPHOS 58  --  60 52  BILITOT 1.1 0.8 1.0 1.2  PROT 6.4* 6.1 6.3* 6.8  ALBUMIN  4.0  --  4.5 4.4   No results for input(s): LIPASE, AMYLASE in the last 8760 hours. No results for input(s): AMMONIA in the last 8760  hours. CBC: Recent Labs    08/11/23 0856 08/20/23 1645 08/27/23 1524  WBC 3.8 6.5 5.5  NEUTROABS 1,588 4.5 3.0  HGB 14.3 14.9 15.0  HCT 43.4 45.1 45.7  MCV 98.0 97.0 100.7*  PLT 150 152 146*   Lipid Panel: Recent Labs    12/18/22 0842 08/11/23 0856  CHOL 144 153  HDL 64 57  LDLCALC 66 82  TRIG 59 67  CHOLHDL 2.3 2.7   No results found for: HGBA1C  Procedures since last visit: MR Brain W and Wo Contrast Result Date: 08/27/2023 CLINICAL DATA:  Headache, new onset (Age >= 51y) Headache, increasing frequency or severity EXAM: MRI HEAD WITHOUT AND WITH CONTRAST TECHNIQUE: Multiplanar, multiecho pulse sequences of the brain and surrounding structures were obtained without and with intravenous contrast. CONTRAST:  6mL GADAVIST  GADOBUTROL  1 MMOL/ML IV SOLN COMPARISON:  MRI head May 6, 24 without report. CTA head/neck 08/20/2023. FINDINGS: Brain: No acute infarction, hemorrhage, hydrocephalus, or extra-axial collection. Mild for age scattered T2/FLAIR hyperintensities in the white matter, nonspecific but compatible with chronic microvascular ischemic disease. Approximately 1.5 cm extra-axial, dural-based lesion along the left frontal convexity frontal convexity, compatible with a meningioma. No significant mass effect or adjacent brain edema. Vascular: Major arterial flow voids are maintained at the skull base. Skull and upper cervical spine: Normal marrow signal. Sinuses/Orbits: Left maxillary sinus frothy secretions. No acute orbital findings. Other: No mastoid effusions. IMPRESSION: 1. No evidence of acute intracranial abnormality. 2. Approximately 1.5 cm left frontal convexity putative meningioma without significant mass effect or adjacent brain edema. 3. Left maxillary sinus frothy secretions. Electronically Signed   By: Gilmore GORMAN Molt M.D.   On: 08/27/2023 20:17   CT ANGIO HEAD NECK W WO CM Result Date: 08/20/2023 CLINICAL DATA:  Acute onset of severe right-sided headaches for 2  days with nausea. No significant past history of headaches. EXAM: CT ANGIOGRAPHY HEAD AND NECK WITH AND WITHOUT CONTRAST TECHNIQUE: Multidetector CT imaging of the head and neck was performed using the standard protocol during bolus administration of intravenous contrast. Multiplanar CT image reconstructions and MIPs were obtained to evaluate the vascular anatomy. Carotid stenosis measurements (when applicable) are obtained utilizing NASCET criteria, using the distal internal carotid diameter as the denominator. RADIATION DOSE REDUCTION: This exam was performed according to the departmental dose-optimization program which includes automated exposure control, adjustment of the mA and/or kV according to patient size and/or use of iterative reconstruction technique. CONTRAST:  75mL OMNIPAQUE  IOHEXOL  350 MG/ML SOLN COMPARISON:  CT head without contrast 10/27/22. MR head without and with contrast 12/01/2022. FINDINGS: CT HEAD FINDINGS Brain: A calcified meningioma over the left frontal convexity is stable measuring 16 x 11 mm. No other dural-based lesions are present. Mild atrophy and white matter changes are stable, likely within normal limits for age. Deep brain nuclei are within normal limits. The ventricles are of normal size. No significant extraaxial fluid collection is present. The brainstem and cerebellum are within normal limits. Midline structures are within normal limits. Vascular: No hyperdense vessel or unexpected calcification. Skull: Calvarium is intact. No focal lytic or blastic lesions are present. No significant extracranial soft tissue lesion is present. Sinuses/Orbits: The paranasal sinuses and mastoid air cells are clear. Bilateral lens replacements are noted. Globes and orbits are otherwise unremarkable. Review of the MIP images confirms the above findings  CTA NECK FINDINGS Aortic arch: A 3 vessel arch configuration is present. Atherosclerotic changes are present at the aortic arch. No significant  stenosis is present at the great vessel origins. No dissection is present. Right carotid system: The right common carotid artery is within normal limits. Minimal calcification is present at the bifurcation without significant stenosis. The cervical right ICA is normal. Left carotid system: The left common carotid artery is within normal limits. The bifurcation is unremarkable. The cervical left ICA is normal. Vertebral arteries: The right vertebral artery is the dominant vessel. Both vertebral arteries originate from the subclavian artery without significant stenosis. Mild tortuosity is present in the left the A1 segment without significant stenosis. No significant stenosis is present in either vertebral artery in the neck. Skeleton: Vertebral body heights and alignment are normal. Uncovertebral spurring is present at C5-6 and C6-7 without definite stenosis. No focal osseous lesions are present. Other neck: Soft tissues the neck are otherwise unremarkable. Salivary glands are within normal limits. Thyroid  is normal. No significant adenopathy is present. No focal mucosal or submucosal lesions are present. Upper chest: The lung apices are clear. The thoracic inlet is within normal limits. Review of the MIP images confirms the above findings CTA HEAD FINDINGS Anterior circulation: The internal carotid arteries are within normal limits from the skull base to the ICA termini. The A1 and M1 segments are normal. The anterior communicating artery is patent. The MCA bifurcations are within normal limits bilaterally. The ACA and MCA branch vessels are normal bilaterally. No aneurysm is present. Posterior circulation: PICA origins are visualized and normal. The vertebrobasilar junction basilar artery normal. The superior cerebellar arteries are patent. Both posterior cerebral arteries originate from the basilar tip. The PCA branch vessels are within normal limits bilaterally. No aneurysm is present. Venous sinuses: The dural  sinuses are patent. The straight sinus and deep cerebral veins are intact. Cortical veins are within normal limits. No significant vascular malformation is evident. Anatomic variants: None Review of the MIP images confirms the above findings IMPRESSION: 1. Normal CTA of the head and neck. No large vessel occlusion, hemodynamically significant stenosis, or other acute vascular abnormality. 2. Stable calcified meningioma over the left frontal convexity. 3. Mild atrophy and white matter changes are stable, likely within normal limits for age. 4.  Aortic Atherosclerosis (ICD10-I70.0). Electronically Signed   By: Lonni Necessary M.D.   On: 08/20/2023 18:51    Assessment/Plan  1. Acute bacterial sinusitis (Primary) -  Left maxillary sinus proti secretions noted on MRI. Right-sided nasal drainage without color. No fever or significant congestion reported. -  will start on Florastor (probiotic) to prevent yeast infection while on antibiotics. -Start Amoxicillin -Clavulanate (Augmentin ) 875-125mg  twice daily for 7 days. - amoxicillin -clavulanate (AUGMENTIN ) 875-125 MG tablet; Take 1 tablet by mouth 2 (two) times daily for 7 days.  Dispense: 14 tablet; Refill: 0 - saccharomyces boulardii (FLORASTOR) 250 MG capsule; Take 1 capsule (250 mg total) by mouth 2 (two) times daily for 10 days.  Dispense: 20 capsule; Refill: 0  2. Nonintractable episodic headache, unspecified headache type -  Persistent and worsening over the past two weeks, with a severity of 8/10. No relief with Compazine  and Benadryl . MRI showed left maxillary sinus proti secretions, suggesting a possible sinus infection. -Start Amoxicillin -Clavulanate (Augmentin ) 875-125mg  twice daily for 7 days. -Continue current headache treatment with Compazine  and Benadryl  as needed.  3. Urinary tract infection with hematuria, site unspecified -  Currently on Cephalexin  500mg  twice daily with a couple of days  left of treatment. -Continue Cephalexin  500mg   twice daily until course is completed.  4. Paroxysmal A-fib (HCC) -  Regular rhythm currently maintained on Eliquis  and Diltiazem  as needed. -Continue current management with Eliquis  and Diltiazem  as needed. Follow-up Monitor response to antibiotics and headache relief.        Labs/tests ordered:   None   No follow-ups on file.  Ivyonna Hoelzel Medina-Vargas, NP

## 2023-09-04 ENCOUNTER — Telehealth: Payer: Self-pay | Admitting: Neurology

## 2023-09-04 NOTE — Telephone Encounter (Signed)
 [facility] HTA pending

## 2023-09-09 ENCOUNTER — Telehealth: Payer: Self-pay

## 2023-09-09 NOTE — Telephone Encounter (Signed)
Patient want to asked [name], Dinah C, NP to refill for serve headache but patient has an appointment to see Kenard Gower, NP in the morning.

## 2023-09-10 ENCOUNTER — Ambulatory Visit (INDEPENDENT_AMBULATORY_CARE_PROVIDER_SITE_OTHER): Payer: PPO | Admitting: Adult Health

## 2023-09-10 ENCOUNTER — Encounter: Payer: Self-pay | Admitting: Adult Health

## 2023-09-10 ENCOUNTER — Other Ambulatory Visit (HOSPITAL_COMMUNITY): Payer: Self-pay

## 2023-09-10 VITALS — BP 110/74 | Temp 97.8°F | Ht 67.0 in | Wt 136.0 lb

## 2023-09-10 DIAGNOSIS — J019 Acute sinusitis, unspecified: Secondary | ICD-10-CM

## 2023-09-10 DIAGNOSIS — B9689 Other specified bacterial agents as the cause of diseases classified elsewhere: Secondary | ICD-10-CM | POA: Diagnosis not present

## 2023-09-10 DIAGNOSIS — R110 Nausea: Secondary | ICD-10-CM

## 2023-09-10 DIAGNOSIS — R519 Headache, unspecified: Secondary | ICD-10-CM | POA: Diagnosis not present

## 2023-09-10 DIAGNOSIS — I480 Paroxysmal atrial fibrillation: Secondary | ICD-10-CM | POA: Diagnosis not present

## 2023-09-10 MED ORDER — PROCHLORPERAZINE MALEATE 10 MG PO TABS
10.0000 mg | ORAL_TABLET | Freq: Three times a day (TID) | ORAL | 0 refills | Status: DC | PRN
  Filled 2023-09-10 (×2): qty 20, 7d supply, fill #0

## 2023-09-10 MED ORDER — ACETAMINOPHEN 500 MG PO TABS: 1000.0000 mg | ORAL_TABLET | Freq: Three times a day (TID) | ORAL | Status: DC | PRN

## 2023-09-10 NOTE — Progress Notes (Signed)
[facility] clinic  Provider:  Kenard Gower DNP  Code Status:  Full Code  Goals of Care:     09/10/2023    9:09 AM  Advanced Directives  Does Patient Have a Medical Advance Directive? Yes  Type of Estate agent of [place];Living will;Out of facility DNR (pink MOST or yellow form)  Copy of Healthcare Power of Attorney in Chart? Yes - validated most recent copy scanned in chart (See row information)     Chief Complaint  Patient presents with   Headache    Severe headache. Discuss the need for covid.    Discussed the use of AI scribe software for clinical note transcription with the patient, who gave verbal consent to proceed.   HPI: Patient is a 83 y.o. female seen today for an acute visit for headache. She was accompanied today by her husband.  Shelia [name] is an 83 year old female who presents with persistent headaches and nausea.  She experiences intense headaches, rated as 9 out of 10 in severity, particularly noted yesterday. Despite the severity, she remains active and finds that distractions help lessen the perceived intensity of the pain. She uses hot showers and a heat pack on her head, which provide minimal relief. She takes Tylenol, 1000 mg in the morning and at night, but finds it ineffective during the day. Sometimes the pain subsides by the next morning, though she is unsure if this is due to the Tylenol.  She has a history of sinus issues, with an MRI (07/31/23) showing left maxillary sinus frothy secretions, currently being treated with Augmentin. She has not yet seen an ENT specialist due to persistent sinus-related symptoms.  She feels nauseous today despite not having a headache. She is taking Augmentin, which she takes with yogurt, and is advised to take it with food to prevent nausea. She has used Compazine for nausea, which she received in the ER, and finds it helpful when taken with Benadryl for her headaches. No use of ibuprofen due  to being on a blood thinner and no caffeine consumption due to previous lightheadedness.  She is on Eliquis for atrial fibrillation and has not experienced palpitations recently. She has diltiazem prescribed as needed but has not used it.     Past Medical History:  Diagnosis Date   Anxiety    Arthritis    "left knee; left hip; lower back; mild to moderate in right hip" (09/30/2016)   Atrial fibrillation (HCC)    Chronic lower back pain    Gastritis    Heart palpitations    History of bone density study 2018   History of mammogram 2021   History of MRI 2018   History of Papanicolaou smear of cervix    Hypercholesteremia    Osteopenia    Seasonal allergies     Past Surgical History:  Procedure Laterality Date   ATRIAL FIBRILLATION ABLATION N/A 06/10/2022   Procedure: ATRIAL FIBRILLATION ABLATION;  Surgeon: Regan Lemming, MD;  Location: [facility] INVASIVE CV LAB;  Service: Cardiovascular;  Laterality: N/A;   CARDIOVERSION N/A 11/17/2019   Procedure: CARDIOVERSION;  Surgeon: Lewayne Bunting, MD;  Location: [facility] ENDOSCOPY;  Service: Cardiovascular;  Laterality: N/A;   CARDIOVERSION N/A 04/16/2021   Procedure: CARDIOVERSION;  Surgeon: Chilton Si, MD;  Location: [facility] ENDOSCOPY;  Service: Cardiovascular;  Laterality: N/A;   CARDIOVERSION N/A 08/15/2021   Procedure: CARDIOVERSION;  Surgeon: Maisie Fus, MD;  Location: [facility] ENDOSCOPY;  Service: Cardiovascular;  Laterality: N/A;  CARDIOVERSION N/A 05/25/2023   Procedure: CARDIOVERSION;  Surgeon: Chrystie Nose, MD;  Location: [facility] INVASIVE CV LAB;  Service: Cardiovascular;  Laterality: N/A;   COLONOSCOPY     DILATION AND CURETTAGE OF UTERUS     JOINT REPLACEMENT     TONSILLECTOMY     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   Procedure: TOTAL HIP ARTHROPLASTY ANTERIOR APPROACH;  Surgeon: Jodi Geralds, MD;  Location: [facility] OR;  Service: Orthopedics;  Laterality: Left;    No Known Allergies  Outpatient Encounter Medications as of  09/10/2023  Medication Sig   acetaminophen (TYLENOL) 500 MG tablet Take 2 tablets (1,000 mg total) by mouth every 8 (eight) hours as needed.   amoxicillin (AMOXIL) 500 MG capsule Take 4 capsules (2,000 mg total) by mouth 1 hour prior to appointment.   amoxicillin-clavulanate (AUGMENTIN) 875-125 MG tablet Take 1 tablet by mouth 2 (two) times daily for 7 days.   apixaban (ELIQUIS) 5 MG TABS tablet Take 1 tablet (5 mg total) by mouth 2 (two) times daily.   Brimonidine Tartrate (LUMIFY) 0.025 % SOLN Place 1 drop into both eyes daily. Red eyes   Carboxymeth-Glyc-Polysorb PF (REFRESH OPTIVE MEGA-3) 0.5-1-0.5 % SOLN Place 1 drop into both eyes 4 (four) times daily as needed (dry eyes). Enhance with flax seed oil   cephALEXin (KEFLEX) 500 MG capsule Take 1 capsule (500 mg total) by mouth 2 (two) times daily.   diltiazem (CARDIZEM) 30 MG tablet Take 1 tablet every 4 hours AS NEEDED for AFIB heart rate >100 as long as top BP >100.   diphenhydrAMINE (BENADRYL) 25 MG tablet Take 1 tablet (25 mg total) by mouth every 8 (eight) hours as needed (headache).   hydrocortisone cream 0.5 % Apply 1 Application topically 2 (two) times daily as needed for itching.   ketotifen (ZADITOR) 0.025 % ophthalmic solution Place 1 drop into both eyes 2 (two) times daily as needed (itchy eyes). In the fall   loratadine (CLARITIN) 10 MG tablet Take 10 mg by mouth daily as needed for allergies.   mineral oil-hydrophilic petrolatum (AQUAPHOR) ointment Apply 1 Application topically as needed for dry skin.   Multiple Vitamin (ONE-DAILY MULTI VITAMINS PO) Take 30 mLs by mouth daily. [name] liquid vitamin   NON FORMULARY Take 1,000 mg by mouth daily. Plant calcium   OVER THE COUNTER MEDICATION Place 1 application  into both eyes at bedtime. Pure & Clean   rosuvastatin (CRESTOR) 10 MG tablet Take 1 tablet (10 mg total) by mouth daily.   saccharomyces boulardii (FLORASTOR) 250 MG capsule Take 1 capsule (250 mg total) by mouth 2 (two)  times daily for 10 days.   triamcinolone cream (KENALOG) 0.1 % Apply 1 Application topically 2 (two) times daily as needed (itching).   Vitamins C E (CRANBERRY URINARY COMFORT PO) Take by mouth.   [DISCONTINUED] acetaminophen (TYLENOL) 325 MG tablet Take 325 mg by mouth every 6 (six) hours as needed for moderate pain or mild pain.   [DISCONTINUED] prochlorperazine (COMPAZINE) 10 MG tablet Take 1 tablet (10 mg total) by mouth every 8 (eight) hours as needed (headache).   prochlorperazine (COMPAZINE) 10 MG tablet Take 1 tablet (10 mg total) by mouth every 8 (eight) hours as needed (headache). And Nausea   No facility-administered encounter medications on file as of 09/10/2023.    Review of Systems:  Review of Systems  Constitutional:  Negative for appetite change, chills, fatigue and fever.  HENT:  Negative for congestion, hearing loss, rhinorrhea and sore throat.  Eyes: Negative.   Respiratory:  Negative for cough, shortness of breath and wheezing.   Cardiovascular:  Negative for chest pain, palpitations and leg swelling.  Gastrointestinal:  Positive for nausea. Negative for abdominal pain, constipation, diarrhea and vomiting.  Genitourinary:  Negative for dysuria.  Musculoskeletal:  Negative for arthralgias, back pain and myalgias.  Skin:  Negative for color change, rash and wound.  Neurological:  Positive for headaches. Negative for dizziness and weakness.  Psychiatric/Behavioral:  Negative for behavioral problems. The patient is not nervous/anxious.     Health Maintenance  Topic Date Due   COVID-19 Vaccine (8 - 2024-25 season) 08/03/2023   Medicare Annual Wellness (AWV)  04/30/2024   DTaP/Tdap/Td (4 - Td or Tdap) 12/26/2027   Pneumonia Vaccine 68+ Years old  Completed   INFLUENZA VACCINE  Completed   DEXA SCAN  Completed   Zoster Vaccines- Shingrix  Completed   HPV VACCINES  Aged Out   Hepatitis C Screening  Discontinued    Physical Exam: Vitals:   09/10/23 0900  BP: 110/74   Temp: 97.8 F (36.6 C)  Weight: 136 lb (61.7 kg)  Height: 5\' 7"  (1.702 m)   Body mass index is 21.3 kg/m. Physical Exam Constitutional:      Appearance: Normal appearance.  HENT:     Head: Normocephalic and atraumatic.     Nose: Nose normal.     Mouth/Throat:     Mouth: Mucous membranes are moist.  Eyes:     Conjunctiva/sclera: Conjunctivae normal.  Cardiovascular:     Rate and Rhythm: Normal rate and regular rhythm.  Pulmonary:     Effort: Pulmonary effort is normal.     Breath sounds: Normal breath sounds.  Abdominal:     General: Bowel sounds are normal.     Palpations: Abdomen is soft.  Musculoskeletal:        General: Normal range of motion.     Cervical back: Normal range of motion.  Skin:    General: Skin is warm and dry.  Neurological:     General: No focal deficit present.     Mental Status: She is alert and oriented to person, place, and time.  Psychiatric:        Mood and Affect: Mood normal.        Behavior: Behavior normal.        Thought Content: Thought content normal.        Judgment: Judgment normal.     Labs reviewed: Basic Metabolic Panel: Recent Labs    12/18/22 0842 03/19/23 1004 08/11/23 0856 08/20/23 1645 08/27/23 1524  NA 142   < > 145 141 143  K 4.1   < > 4.2 3.9 4.0  CL 107   < > 108 105 108  CO2 29   < > 30 27 25   GLUCOSE 77   < > 74 107* 97  BUN 26*   < > 26* 18 19  CREATININE 0.75   < > 0.77 0.67 0.73  CALCIUM 9.1   < > 9.2 9.7 9.5  TSH 3.36  --  3.37  --   --    < > = values in this interval not displayed.   Liver Function Tests: Recent Labs    03/19/23 1004 08/11/23 0856 08/20/23 1645 08/27/23 1524  AST 37 27 33 40  ALT 28 18 28 23   ALKPHOS 58  --  60 52  BILITOT 1.1 0.8 1.0 1.2  PROT 6.4* 6.1 6.3* 6.8  ALBUMIN 4.0  --  4.5 4.4   No results for input(s): "LIPASE", "AMYLASE" in the last 8760 hours. No results for input(s): "AMMONIA" in the last 8760 hours. CBC: Recent Labs    08/11/23 0856 08/20/23 1645  08/27/23 1524  WBC 3.8 6.5 5.5  NEUTROABS 1,588 4.5 3.0  HGB 14.3 14.9 15.0  HCT 43.4 45.1 45.7  MCV 98.0 97.0 100.7*  PLT 150 152 146*   Lipid Panel: Recent Labs    12/18/22 0842 08/11/23 0856  CHOL 144 153  HDL 64 57  LDLCALC 66 82  TRIG 59 67  CHOLHDL 2.3 2.7   No results found for: "HGBA1C"  Procedures since last visit: MR Brain W and Wo Contrast Result Date: 08/27/2023 CLINICAL DATA:  Headache, new onset (Age >= 51y) Headache, increasing frequency or severity EXAM: MRI HEAD WITHOUT AND WITH CONTRAST TECHNIQUE: Multiplanar, multiecho pulse sequences of the brain and surrounding structures were obtained without and with intravenous contrast. CONTRAST:  6mL GADAVIST GADOBUTROL 1 MMOL/ML IV SOLN COMPARISON:  MRI head May 6, 24 without report. CTA head/neck 08/20/2023. FINDINGS: Brain: No acute infarction, hemorrhage, hydrocephalus, or extra-axial collection. Mild for age scattered T2/FLAIR hyperintensities in the white matter, nonspecific but compatible with chronic microvascular ischemic disease. Approximately 1.5 cm extra-axial, dural-based lesion along the left frontal convexity frontal convexity, compatible with a meningioma. No significant mass effect or adjacent brain edema. Vascular: Major arterial flow voids are maintained at the skull base. Skull and upper cervical spine: Normal marrow signal. Sinuses/Orbits: Left maxillary sinus frothy secretions. No acute orbital findings. Other: No mastoid effusions. IMPRESSION: 1. No evidence of acute intracranial abnormality. 2. Approximately 1.5 cm left frontal convexity putative meningioma without significant mass effect or adjacent brain edema. 3. Left maxillary sinus frothy secretions. Electronically Signed   By: Feliberto Harts M.D.   On: 08/27/2023 20:17   CT ANGIO HEAD NECK W WO CM Result Date: 08/20/2023 CLINICAL DATA:  Acute onset of severe right-sided headaches for 2 days with nausea. No significant past history of headaches.  EXAM: CT ANGIOGRAPHY HEAD AND NECK WITH AND WITHOUT CONTRAST TECHNIQUE: Multidetector CT imaging of the head and neck was performed using the standard protocol during bolus administration of intravenous contrast. Multiplanar CT image reconstructions and MIPs were obtained to evaluate the vascular anatomy. Carotid stenosis measurements (when applicable) are obtained utilizing NASCET criteria, using the distal internal carotid diameter as the denominator. RADIATION DOSE REDUCTION: This exam was performed according to the departmental dose-optimization program which includes automated exposure control, adjustment of the mA and/or kV according to patient size and/or use of iterative reconstruction technique. CONTRAST:  75mL OMNIPAQUE IOHEXOL 350 MG/ML SOLN COMPARISON:  CT head without contrast 10/27/22. MR head without and with contrast 12/01/2022. FINDINGS: CT HEAD FINDINGS Brain: A calcified meningioma over the left frontal convexity is stable measuring 16 x 11 mm. No other dural-based lesions are present. Mild atrophy and white matter changes are stable, likely within normal limits for age. Deep brain nuclei are within normal limits. The ventricles are of normal size. No significant extraaxial fluid collection is present. The brainstem and cerebellum are within normal limits. Midline structures are within normal limits. Vascular: No hyperdense vessel or unexpected calcification. Skull: Calvarium is intact. No focal lytic or blastic lesions are present. No significant extracranial soft tissue lesion is present. Sinuses/Orbits: The paranasal sinuses and mastoid air cells are clear. Bilateral lens replacements are noted. Globes and orbits are otherwise unremarkable. Review of the MIP images confirms the above findings CTA NECK FINDINGS Aortic  arch: A 3 vessel arch configuration is present. Atherosclerotic changes are present at the aortic arch. No significant stenosis is present at the great vessel origins. No dissection  is present. Right carotid system: The right common carotid artery is within normal limits. Minimal calcification is present at the bifurcation without significant stenosis. The cervical right ICA is normal. Left carotid system: The left common carotid artery is within normal limits. The bifurcation is unremarkable. The cervical left ICA is normal. Vertebral arteries: The right vertebral artery is the dominant vessel. Both vertebral arteries originate from the subclavian artery without significant stenosis. Mild tortuosity is present in the left the A1 segment without significant stenosis. No significant stenosis is present in either vertebral artery in the neck. Skeleton: Vertebral body heights and alignment are normal. Uncovertebral spurring is present at C5-6 and C6-7 without definite stenosis. No focal osseous lesions are present. Other neck: Soft tissues the neck are otherwise unremarkable. Salivary glands are within normal limits. Thyroid is normal. No significant adenopathy is present. No focal mucosal or submucosal lesions are present. Upper chest: The lung apices are clear. The thoracic inlet is within normal limits. Review of the MIP images confirms the above findings CTA HEAD FINDINGS Anterior circulation: The internal carotid arteries are within normal limits from the skull base to the ICA termini. The A1 and M1 segments are normal. The anterior communicating artery is patent. The MCA bifurcations are within normal limits bilaterally. The ACA and MCA branch vessels are normal bilaterally. No aneurysm is present. Posterior circulation: PICA origins are visualized and normal. The vertebrobasilar junction basilar artery normal. The superior cerebellar arteries are patent. Both posterior cerebral arteries originate from the basilar tip. The PCA branch vessels are within normal limits bilaterally. No aneurysm is present. Venous sinuses: The dural sinuses are patent. The straight sinus and deep cerebral veins  are intact. Cortical veins are within normal limits. No significant vascular malformation is evident. Anatomic variants: None Review of the MIP images confirms the above findings IMPRESSION: 1. Normal CTA of the head and neck. No large vessel occlusion, hemodynamically significant stenosis, or other acute vascular abnormality. 2. Stable calcified meningioma over the left frontal convexity. 3. Mild atrophy and white matter changes are stable, likely within normal limits for age. 4.  Aortic Atherosclerosis (ICD10-I70.0). Electronically Signed   By: Marin Roberts M.D.   On: 08/20/2023 18:51    Assessment/Plan  1. Nonintractable episodic headache, unspecified headache type (Primary) -   Intense headache yesterday, rated 9/10.  -Continue Tylenol 1000mg  every 8 hours as needed. -Consider follow-up with neurologist due to persistent headaches -  continue Compazine Compazine 10 mg Q 8 hours PRN and Benadryl 25 mg Q 8 hours PRN - acetaminophen (TYLENOL) 500 MG tablet; Take 2 tablets (1,000 mg total) by mouth every 8 (eight) hours as needed.  2. Nausea - prochlorperazine (COMPAZINE) 10 MG tablet; Take 1 tablet (10 mg total) by mouth every 8 (eight) hours as needed (headache). And Nausea  Dispense: 20 tablet; Refill: 0  3. Paroxysmal A-fib (HCC) -  Heart rate regular on examination. -Continue Eliquis and as-needed Diltiazem.   4. Acute bacterial sinusitis -  Currently on Augmentin with associated nausea. MRI shows inflammation. -Continue Augmentin with food to minimize nausea. -Refer to ENT for further evaluation. - Ambulatory referral to ENT     Labs/tests ordered:  None  Next appt:  02/02/2024

## 2023-09-11 ENCOUNTER — Other Ambulatory Visit: Payer: Self-pay | Admitting: Adult Health

## 2023-09-11 ENCOUNTER — Other Ambulatory Visit (HOSPITAL_COMMUNITY): Payer: Self-pay

## 2023-09-11 DIAGNOSIS — R110 Nausea: Secondary | ICD-10-CM

## 2023-09-11 MED ORDER — PROCHLORPERAZINE MALEATE 10 MG PO TABS: 10.0000 mg | ORAL_TABLET | Freq: Three times a day (TID) | ORAL | 0 refills | Status: DC | PRN

## 2023-09-11 NOTE — Telephone Encounter (Signed)
High Risk Warning Populated when attempting to refill, I will send to Provider for further review

## 2023-09-12 ENCOUNTER — Other Ambulatory Visit (HOSPITAL_COMMUNITY): Payer: Self-pay

## 2023-09-14 NOTE — Telephone Encounter (Signed)
Called [facility] to check the status on NPSG it is still pending.

## 2023-09-17 ENCOUNTER — Encounter: Payer: Self-pay | Admitting: Adult Health

## 2023-09-17 ENCOUNTER — Other Ambulatory Visit: Payer: Self-pay

## 2023-09-17 ENCOUNTER — Other Ambulatory Visit (HOSPITAL_COMMUNITY): Payer: Self-pay

## 2023-09-17 ENCOUNTER — Ambulatory Visit (INDEPENDENT_AMBULATORY_CARE_PROVIDER_SITE_OTHER): Payer: PPO | Admitting: Adult Health

## 2023-09-17 VITALS — BP 118/88 | HR 58 | Temp 95.6°F | Resp 18 | Ht 67.0 in | Wt 139.2 lb

## 2023-09-17 DIAGNOSIS — I480 Paroxysmal atrial fibrillation: Secondary | ICD-10-CM

## 2023-09-17 DIAGNOSIS — R519 Headache, unspecified: Secondary | ICD-10-CM

## 2023-09-17 MED ORDER — SUMATRIPTAN SUCCINATE 25 MG PO TABS
25.0000 mg | ORAL_TABLET | ORAL | 0 refills | Status: DC | PRN
Start: 2023-09-17 — End: 2023-09-23
  Filled 2023-09-17: qty 9, 30d supply, fill #0
  Filled 2023-09-17: qty 18, 60d supply, fill #0

## 2023-09-17 NOTE — Patient Instructions (Signed)
Sumatriptan Tablets What is this medication? SUMATRIPTAN (soo ma TRIP tan) treats migraines. It works by blocking pain signals and narrowing blood vessels in the brain. It belongs to a group of medications called triptans. It is not used to prevent migraines. This medicine may be used for other purposes; ask your health care provider or pharmacist if you have questions. COMMON BRAND NAME(S): Imitrex, Migraine Pack What should I tell my care team before I take this medication? They need to know if you have any of these conditions: Circulation problems in fingers and toes Diabetes Heart disease High blood pressure High cholesterol History of irregular heartbeat History of stroke Kidney disease Liver disease Stomach or intestine problems Tobacco use An unusual or allergic reaction to sumatriptan, other medications, foods, dyes, or preservatives Pregnant or trying to get pregnant Breastfeeding How should I use this medication? Take this medication by mouth with a glass of water. Follow the directions on the prescription label. Do not take it more often than directed. Talk to your care team about the use of this medication in children. Special care may be needed. Overdosage: If you think you have taken too much of this medicine contact a poison control center or emergency room at once. NOTE: This medicine is only for you. Do not share this medicine with others. What if I miss a dose? This does not apply. This medication is not for regular use. What may interact with this medication? Do not take this medication with any of the following: Certain medications for migraine headache, such as almotriptan, eletriptan, frovatriptan, naratriptan, rizatriptan, sumatriptan, zolmitriptan Ergot alkaloids, such as dihydroergotamine, ergonovine, ergotamine, methylergonovine MAOIs, such as Carbex, Eldepryl, Marplan, Nardil, and Parnate This medication may also interact with the following: Certain  medications for mental health conditions This list may not describe all possible interactions. Give your health care provider a list of all the medicines, herbs, non-prescription drugs, or dietary supplements you use. Also tell them if you smoke, drink alcohol, or use illegal drugs. Some items may interact with your medicine. What should I watch for while using this medication? Visit your care team for regular checks on your progress. Tell your care team if your symptoms do not start to get better or if they get worse. This medication may affect your coordination, reaction time, or judgment. Do not drive or operate machinery until you know how this medication affects you. Sit up or stand slowly to reduce the risk of dizzy or fainting spells. Drinking alcohol with this medication can increase the risk of these side effects. Tell your care team right away if you have any change in your eyesight. If you take migraine medications for 10 or more days a month, your migraines may get worse. Keep a diary of headache days and medication use. Contact your care team if your migraine attacks occur more frequently. What side effects may I notice from receiving this medication? Side effects that you should report to your care team as soon as possible: Allergic reactions--skin rash, itching, hives, swelling of the face, lips, tongue, or throat Burning, pain, tingling, or color changes in the legs or feet Heart attack--pain or tightness in the chest, shoulders, arms, or jaw, nausea, shortness of breath, cold or clammy skin, feeling faint or lightheaded Heart rhythm changes--fast or irregular heartbeat, dizziness, feeling faint or lightheaded, chest pain, trouble breathing Increase in blood pressure Raynaud's--cool, numb, or painful fingers or toes that may change color from pale, to blue, to red Seizures Serotonin syndrome--irritability,  confusion, fast or irregular heartbeat, muscle stiffness, twitching muscles,  sweating, high fever, seizure, chills, vomiting, diarrhea Stroke--sudden numbness or weakness of the face, arm, or leg, trouble speaking, confusion, trouble walking, loss of balance or coordination, dizziness, severe headache, change in vision Sudden or severe stomach pain, nausea, vomiting, fever, or bloody diarrhea Vision loss Side effects that usually do not require medical attention (report to your care team if they continue or are bothersome): Dizziness General discomfort or fatigue This list may not describe all possible side effects. Call your doctor for medical advice about side effects. You may report side effects to FDA at 1-800-FDA-1088. Where should I keep my medication? Keep out of the reach of children and pets. Store at room temperature between 2 and 30 degrees C (36 and 86 degrees F). Throw away any unused medication after the expiration date. NOTE: This sheet is a summary. It may not cover all possible information. If you have questions about this medicine, talk to your doctor, pharmacist, or health care provider.  2024 Elsevier/Gold Standard (2022-05-20 00:00:00)

## 2023-09-17 NOTE — Progress Notes (Signed)
 [facility] clinic  Provider:  Kenard Gower DNP  Code Status:  Full Code  Goals of Care:     09/17/2023    1:41 PM  Advanced Directives  Does Patient Have a Medical Advance Directive? Yes  Type of Estate agent of [place];Living will;Out of facility DNR (pink MOST or yellow form)  Does patient want to make changes to medical advance directive? No - Patient declined  Copy of Healthcare Power of Attorney in Chart? Yes - validated most recent copy scanned in chart (See row information)  Would patient like information on creating a medical advance directive? No - Patient declined     Chief Complaint  Patient presents with   Medical Management of Chronic Issues     headaches follow-up    Discussed the use of AI scribe software for clinical note transcription with the patient, who gave verbal consent to proceed.  HPI:  [name] is an 83 year old female who presents with persistent right-sided headaches. She is accompanied by her husband and her sister on the phone, Raynelle Fanning, who is a family doctor in [place].  She has been experiencing persistent right-sided headaches for the past three and a half weeks. The headaches are severe, with an intensity reaching up to 8 out of 10, primarily affecting the right eye and temple. They can last from hours to a day or two, followed by periods of reprieve. Accompanying symptoms include nausea and a 'spacey' feeling, which impairs concentration. Various treatments, including Compazine and Benadryl, have been ineffective, though hot showers provide some inconsistent relief. Imaging studies have ruled out serious conditions, and her sister notes that the headaches do not align with sinus headache patterns, as the CT scan showed left sinus issues while the headaches are right-sided. Of note, she completed Augmentin course for 7 days.  She has a history of atrial fibrillation and is currently taking Eliquis. She has not  needed to use diltiazem recently.  She also has Meniere's disease, for which she takes medication that effectively manages her symptoms.  She experiences soft, unformed stools. Her diet includes soups, which she finds somewhat salty, and she is mindful of maintaining her protein intake.    Past Medical History:  Diagnosis Date   Anxiety    Arthritis    "left knee; left hip; lower back; mild to moderate in right hip" (09/30/2016)   Atrial fibrillation (HCC)    Chronic lower back pain    Gastritis    Heart palpitations    History of bone density study 2018   History of mammogram 2021   History of MRI 2018   History of Papanicolaou smear of cervix    Hypercholesteremia    Osteopenia    Seasonal allergies     Past Surgical History:  Procedure Laterality Date   ATRIAL FIBRILLATION ABLATION N/A 06/10/2022   Procedure: ATRIAL FIBRILLATION ABLATION;  Surgeon: Regan Lemming, MD;  Location: [facility] INVASIVE CV LAB;  Service: Cardiovascular;  Laterality: N/A;   CARDIOVERSION N/A 11/17/2019   Procedure: CARDIOVERSION;  Surgeon: Lewayne Bunting, MD;  Location: [facility] ENDOSCOPY;  Service: Cardiovascular;  Laterality: N/A;   CARDIOVERSION N/A 04/16/2021   Procedure: CARDIOVERSION;  Surgeon: Chilton Si, MD;  Location: [facility] ENDOSCOPY;  Service: Cardiovascular;  Laterality: N/A;   CARDIOVERSION N/A 08/15/2021   Procedure: CARDIOVERSION;  Surgeon: Maisie Fus, MD;  Location: [facility] ENDOSCOPY;  Service: Cardiovascular;  Laterality: N/A;   CARDIOVERSION N/A 05/25/2023   Procedure: CARDIOVERSION;  Surgeon:  Chrystie Nose, MD;  Location: [facility] INVASIVE CV LAB;  Service: Cardiovascular;  Laterality: N/A;   COLONOSCOPY     DILATION AND CURETTAGE OF UTERUS     JOINT REPLACEMENT     TONSILLECTOMY     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   Procedure: TOTAL HIP ARTHROPLASTY ANTERIOR APPROACH;  Surgeon: Jodi Geralds, MD;  Location: [facility] OR;  Service: Orthopedics;  Laterality: Left;    No Known  Allergies  Outpatient Encounter Medications as of 09/17/2023  Medication Sig   amoxicillin (AMOXIL) 500 MG capsule Take 4 capsules (2,000 mg total) by mouth 1 hour prior to appointment.   apixaban (ELIQUIS) 5 MG TABS tablet Take 1 tablet (5 mg total) by mouth 2 (two) times daily.   Brimonidine Tartrate (LUMIFY) 0.025 % SOLN Place 1 drop into both eyes daily. Red eyes   Carboxymeth-Glyc-Polysorb PF (REFRESH OPTIVE MEGA-3) 0.5-1-0.5 % SOLN Place 1 drop into both eyes 4 (four) times daily as needed (dry eyes). Enhance with flax seed oil   cephALEXin (KEFLEX) 500 MG capsule Take 1 capsule (500 mg total) by mouth 2 (two) times daily.   diltiazem (CARDIZEM) 30 MG tablet Take 1 tablet every 4 hours AS NEEDED for AFIB heart rate >100 as long as top BP >100.   diphenhydrAMINE (BENADRYL) 25 MG tablet Take 1 tablet (25 mg total) by mouth every 8 (eight) hours as needed (headache).   hydrocortisone cream 0.5 % Apply 1 Application topically 2 (two) times daily as needed for itching.   ketotifen (ZADITOR) 0.025 % ophthalmic solution Place 1 drop into both eyes 2 (two) times daily as needed (itchy eyes). In the fall   loratadine (CLARITIN) 10 MG tablet Take 10 mg by mouth daily as needed for allergies.   mineral oil-hydrophilic petrolatum (AQUAPHOR) ointment Apply 1 Application topically as needed for dry skin.   Multiple Vitamin (ONE-DAILY MULTI VITAMINS PO) Take 30 mLs by mouth daily. [name] liquid vitamin   NON FORMULARY Take 1,000 mg by mouth daily. Plant calcium   OVER THE COUNTER MEDICATION Place 1 application  into both eyes at bedtime. Pure & Clean   prochlorperazine (COMPAZINE) 10 MG tablet Take 1 tablet (10 mg total) by mouth every 8 (eight) hours as needed (headache). And Nausea   rosuvastatin (CRESTOR) 10 MG tablet Take 1 tablet (10 mg total) by mouth daily.   saccharomyces boulardii (FLORASTOR) 250 MG capsule Take 1 capsule (250 mg total) by mouth 2 (two) times daily for 10 days.   SUMAtriptan  (IMITREX) 25 MG tablet Take 1 tablet (25 mg total) by mouth every 2 (two) hours as needed for migraine. May repeat in 2 hours if headache persists or recurs.   triamcinolone cream (KENALOG) 0.1 % Apply 1 Application topically 2 (two) times daily as needed (itching).   Vitamins C E (CRANBERRY URINARY COMFORT PO) Take by mouth.   acetaminophen (TYLENOL) 500 MG tablet Take 2 tablets (1,000 mg total) by mouth every 8 (eight) hours as needed.   No facility-administered encounter medications on file as of 09/17/2023.    Review of Systems:  Review of Systems  Constitutional:  Negative for appetite change, chills, fatigue and fever.  HENT:  Negative for congestion, hearing loss, rhinorrhea and sore throat.   Eyes: Negative.   Respiratory:  Negative for cough, shortness of breath and wheezing.   Cardiovascular:  Negative for chest pain, palpitations and leg swelling.  Gastrointestinal:  Negative for abdominal pain, constipation, diarrhea, nausea and vomiting.  Genitourinary:  Negative for dysuria.  Musculoskeletal:  Negative for arthralgias, back pain and myalgias.  Skin:  Negative for color change, rash and wound.  Neurological:  Positive for headaches. Negative for dizziness and weakness.  Psychiatric/Behavioral:  Negative for behavioral problems. The patient is not nervous/anxious.     Health Maintenance  Topic Date Due   COVID-19 Vaccine (8 - 2024-25 season) 08/03/2023   Medicare Annual Wellness (AWV)  04/30/2024   DTaP/Tdap/Td (4 - Td or Tdap) 12/26/2027   Pneumonia Vaccine 36+ Years old  Completed   INFLUENZA VACCINE  Completed   DEXA SCAN  Completed   Zoster Vaccines- Shingrix  Completed   HPV VACCINES  Aged Out   Hepatitis C Screening  Discontinued    Physical Exam: Vitals:   09/17/23 1341  BP: 118/88  Pulse: (!) 58  Resp: 18  Temp: (!) 95.6 F (35.3 C)  SpO2: 93%  Weight: 139 lb 3.2 oz (63.1 kg)  Height: 5\' 7"  (1.702 m)   Body mass index is 21.8 kg/m. Physical  Exam Constitutional:      General: She is not in acute distress.    Appearance: Normal appearance.  HENT:     Head: Normocephalic and atraumatic.     Nose: Nose normal.     Mouth/Throat:     Mouth: Mucous membranes are moist.  Eyes:     Conjunctiva/sclera: Conjunctivae normal.  Cardiovascular:     Rate and Rhythm: Normal rate and regular rhythm.  Pulmonary:     Effort: Pulmonary effort is normal.     Breath sounds: Normal breath sounds.  Abdominal:     General: Bowel sounds are normal.     Palpations: Abdomen is soft.  Musculoskeletal:        General: Normal range of motion.     Cervical back: Normal range of motion.  Skin:    General: Skin is warm and dry.  Neurological:     General: No focal deficit present.     Mental Status: She is alert and oriented to person, place, and time.  Psychiatric:        Mood and Affect: Mood normal.        Behavior: Behavior normal.        Thought Content: Thought content normal.        Judgment: Judgment normal.     Labs reviewed: Basic Metabolic Panel: Recent Labs    12/18/22 0842 03/19/23 1004 08/11/23 0856 08/20/23 1645 08/27/23 1524  NA 142   < > 145 141 143  K 4.1   < > 4.2 3.9 4.0  CL 107   < > 108 105 108  CO2 29   < > 30 27 25   GLUCOSE 77   < > 74 107* 97  BUN 26*   < > 26* 18 19  CREATININE 0.75   < > 0.77 0.67 0.73  CALCIUM 9.1   < > 9.2 9.7 9.5  TSH 3.36  --  3.37  --   --    < > = values in this interval not displayed.   Liver Function Tests: Recent Labs    03/19/23 1004 08/11/23 0856 08/20/23 1645 08/27/23 1524  AST 37 27 33 40  ALT 28 18 28 23   ALKPHOS 58  --  60 52  BILITOT 1.1 0.8 1.0 1.2  PROT 6.4* 6.1 6.3* 6.8  ALBUMIN 4.0  --  4.5 4.4   No results for input(s): "LIPASE", "AMYLASE" in the last 8760 hours. No results  for input(s): "AMMONIA" in the last 8760 hours. CBC: Recent Labs    08/11/23 0856 08/20/23 1645 08/27/23 1524  WBC 3.8 6.5 5.5  NEUTROABS 1,588 4.5 3.0  HGB 14.3 14.9 15.0   HCT 43.4 45.1 45.7  MCV 98.0 97.0 100.7*  PLT 150 152 146*   Lipid Panel: Recent Labs    12/18/22 0842 08/11/23 0856  CHOL 144 153  HDL 64 57  LDLCALC 66 82  TRIG 59 67  CHOLHDL 2.3 2.7   No results found for: "HGBA1C"  Procedures since last visit: MR Brain W and Wo Contrast Result Date: 08/27/2023 CLINICAL DATA:  Headache, new onset (Age >= 51y) Headache, increasing frequency or severity EXAM: MRI HEAD WITHOUT AND WITH CONTRAST TECHNIQUE: Multiplanar, multiecho pulse sequences of the brain and surrounding structures were obtained without and with intravenous contrast. CONTRAST:  6mL GADAVIST GADOBUTROL 1 MMOL/ML IV SOLN COMPARISON:  MRI head May 6, 24 without report. CTA head/neck 08/20/2023. FINDINGS: Brain: No acute infarction, hemorrhage, hydrocephalus, or extra-axial collection. Mild for age scattered T2/FLAIR hyperintensities in the white matter, nonspecific but compatible with chronic microvascular ischemic disease. Approximately 1.5 cm extra-axial, dural-based lesion along the left frontal convexity frontal convexity, compatible with a meningioma. No significant mass effect or adjacent brain edema. Vascular: Major arterial flow voids are maintained at the skull base. Skull and upper cervical spine: Normal marrow signal. Sinuses/Orbits: Left maxillary sinus frothy secretions. No acute orbital findings. Other: No mastoid effusions. IMPRESSION: 1. No evidence of acute intracranial abnormality. 2. Approximately 1.5 cm left frontal convexity putative meningioma without significant mass effect or adjacent brain edema. 3. Left maxillary sinus frothy secretions. Electronically Signed   By: Feliberto Harts M.D.   On: 08/27/2023 20:17   CT ANGIO HEAD NECK W WO CM Result Date: 08/20/2023 CLINICAL DATA:  Acute onset of severe right-sided headaches for 2 days with nausea. No significant past history of headaches. EXAM: CT ANGIOGRAPHY HEAD AND NECK WITH AND WITHOUT CONTRAST TECHNIQUE:  Multidetector CT imaging of the head and neck was performed using the standard protocol during bolus administration of intravenous contrast. Multiplanar CT image reconstructions and MIPs were obtained to evaluate the vascular anatomy. Carotid stenosis measurements (when applicable) are obtained utilizing NASCET criteria, using the distal internal carotid diameter as the denominator. RADIATION DOSE REDUCTION: This exam was performed according to the departmental dose-optimization program which includes automated exposure control, adjustment of the mA and/or kV according to patient size and/or use of iterative reconstruction technique. CONTRAST:  75mL OMNIPAQUE IOHEXOL 350 MG/ML SOLN COMPARISON:  CT head without contrast 10/27/22. MR head without and with contrast 12/01/2022. FINDINGS: CT HEAD FINDINGS Brain: A calcified meningioma over the left frontal convexity is stable measuring 16 x 11 mm. No other dural-based lesions are present. Mild atrophy and white matter changes are stable, likely within normal limits for age. Deep brain nuclei are within normal limits. The ventricles are of normal size. No significant extraaxial fluid collection is present. The brainstem and cerebellum are within normal limits. Midline structures are within normal limits. Vascular: No hyperdense vessel or unexpected calcification. Skull: Calvarium is intact. No focal lytic or blastic lesions are present. No significant extracranial soft tissue lesion is present. Sinuses/Orbits: The paranasal sinuses and mastoid air cells are clear. Bilateral lens replacements are noted. Globes and orbits are otherwise unremarkable. Review of the MIP images confirms the above findings CTA NECK FINDINGS Aortic arch: A 3 vessel arch configuration is present. Atherosclerotic changes are present at the aortic arch. No  significant stenosis is present at the great vessel origins. No dissection is present. Right carotid system: The right common carotid artery is  within normal limits. Minimal calcification is present at the bifurcation without significant stenosis. The cervical right ICA is normal. Left carotid system: The left common carotid artery is within normal limits. The bifurcation is unremarkable. The cervical left ICA is normal. Vertebral arteries: The right vertebral artery is the dominant vessel. Both vertebral arteries originate from the subclavian artery without significant stenosis. Mild tortuosity is present in the left the A1 segment without significant stenosis. No significant stenosis is present in either vertebral artery in the neck. Skeleton: Vertebral body heights and alignment are normal. Uncovertebral spurring is present at C5-6 and C6-7 without definite stenosis. No focal osseous lesions are present. Other neck: Soft tissues the neck are otherwise unremarkable. Salivary glands are within normal limits. Thyroid is normal. No significant adenopathy is present. No focal mucosal or submucosal lesions are present. Upper chest: The lung apices are clear. The thoracic inlet is within normal limits. Review of the MIP images confirms the above findings CTA HEAD FINDINGS Anterior circulation: The internal carotid arteries are within normal limits from the skull base to the ICA termini. The A1 and M1 segments are normal. The anterior communicating artery is patent. The MCA bifurcations are within normal limits bilaterally. The ACA and MCA branch vessels are normal bilaterally. No aneurysm is present. Posterior circulation: PICA origins are visualized and normal. The vertebrobasilar junction basilar artery normal. The superior cerebellar arteries are patent. Both posterior cerebral arteries originate from the basilar tip. The PCA branch vessels are within normal limits bilaterally. No aneurysm is present. Venous sinuses: The dural sinuses are patent. The straight sinus and deep cerebral veins are intact. Cortical veins are within normal limits. No significant  vascular malformation is evident. Anatomic variants: None Review of the MIP images confirms the above findings IMPRESSION: 1. Normal CTA of the head and neck. No large vessel occlusion, hemodynamically significant stenosis, or other acute vascular abnormality. 2. Stable calcified meningioma over the left frontal convexity. 3. Mild atrophy and white matter changes are stable, likely within normal limits for age. 4.  Aortic Atherosclerosis (ICD10-I70.0). Electronically Signed   By: Marin Roberts M.D.   On: 08/20/2023 18:51    Assessment/Plan  1. Nonintractable episodic headache, unspecified headache type (Primary) -  New onset, severe, right-sided headaches for the past 3.5 weeks. Headaches can last hours to days with intermittent relief. Previous treatments including Compazine and Benadryl have not been consistently effective. 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 -Start Sumatriptan 25mg  at onset of headache, may repeat dose in 2 hours if not effective. -Plan to reassess effectiveness of Sumatriptan in 2 weeks. -Continue planned follow-up with neurologist - SUMAtriptan (IMITREX) 25 MG tablet; Take 1 tablet (25 mg total) by mouth every 2 (two) hours as needed for migraine. May repeat in 2 hours if headache persists or recurs.  Dispense: 20 tablet; Refill: 0 - Ambulatory referral to Neurology  2. Paroxysmal A-fib (HCC) -  Stable on Eliquis and Diltiazem PRN -Continue Eliquis and Diltiazem as prescribed.     Labs/tests ordered:   None   Return in about 2 weeks (around 10/01/2023).  Kenard Gower, NP

## 2023-09-21 NOTE — Telephone Encounter (Signed)
 [facility]PS[facility] HTA Berkley Harvey: [id number] (exp. 09/04/23 to 12/01/23)

## 2023-09-22 NOTE — Telephone Encounter (Signed)
 NPSG HTA Berkley Harvey: [id number] (exp. 09/04/23 to 12/01/23)   Patient is scheduled at [facility] fpr 10/13/23 at 9 pm.  Mailed packet to the patient.

## 2023-09-23 ENCOUNTER — Other Ambulatory Visit: Payer: Self-pay

## 2023-09-23 ENCOUNTER — Encounter: Payer: Self-pay | Admitting: Adult Health

## 2023-09-23 ENCOUNTER — Other Ambulatory Visit (HOSPITAL_COMMUNITY): Payer: Self-pay

## 2023-09-23 ENCOUNTER — Ambulatory Visit (INDEPENDENT_AMBULATORY_CARE_PROVIDER_SITE_OTHER): Payer: PPO | Admitting: Adult Health

## 2023-09-23 VITALS — BP 130/76 | HR 65 | Temp 97.3°F | Resp 16 | Ht 67.0 in | Wt 133.4 lb

## 2023-09-23 DIAGNOSIS — I480 Paroxysmal atrial fibrillation: Secondary | ICD-10-CM | POA: Diagnosis not present

## 2023-09-23 DIAGNOSIS — R519 Headache, unspecified: Secondary | ICD-10-CM | POA: Diagnosis not present

## 2023-09-23 DIAGNOSIS — G479 Sleep disorder, unspecified: Secondary | ICD-10-CM | POA: Diagnosis not present

## 2023-09-23 MED ORDER — SUMATRIPTAN SUCCINATE 50 MG PO TABS
50.0000 mg | ORAL_TABLET | Freq: Every day | ORAL | 3 refills | Status: DC | PRN
  Filled 2023-09-23: qty 9, 30d supply, fill #0

## 2023-09-23 NOTE — Progress Notes (Signed)
 [facility]  Provider:  Kenard Gower DNP  Code Status:  Full Code  Goals of Care:     09/17/2023    1:41 PM  Advanced Directives  Does Patient Have a Medical Advance Directive? Yes  Type of Estate agent of [place];Living will;Out of facility DNR (pink MOST or yellow form)  Does patient want to make changes to medical advance directive? No - Patient declined  Copy of Healthcare Power of Attorney in Chart? Yes - validated most recent copy scanned in chart (See row information)  Would patient like information on creating a medical advance directive? No - Patient declined     Chief Complaint  Patient presents with   Acute Visit    migraines need to discuss new info and get medication refill    Discussed the use of AI scribe software for clinical note transcription with the patient, who gave verbal consent to proceed.  HPI: Patient is a 83 y.o. female seen today for an acute visit for migraine follow up.  Shelia [name] is an 83 year old female with migraines who presents with frequent headaches. She is accompanied by her husband.  She experiences frequent headaches, having had five in the past six days and typically seven to eight per month. These headaches are described as migraines and are effectively managed with Imitrex (sumatriptan) 50 mg, taken with Tylenol 500 mg. The headaches usually resolve within 20 to 30 minutes of medication and remain absent for at least 24 hours. Of note, she was started on Imitrex 25 mg PRN for headache on 09/17/23. She has increased the dosage to 50 mg which is more effective.  She has been using Imitrex 50 mg as suggested by her sister, Raynelle Fanning (who is a family practice physician), and finds it effective. She has been taking Compazine and Tylenol alongside Imitrex. Discussed that Imitrex can be used alone unless nausea is present, in which case Compazine can be used. 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 She has a history of sinus issues, which were treated with antibiotics.  She has a history of atrial fibrillation but is not currently taking diltiazem as her pulse and blood pressure are stable. No nausea unless specified and reports regular bowel movements with the aid of Metamucil.  She monitors her diet closely, having lost some weight recently, and is considering potential dietary triggers for her headaches. She consumes minimal chocolate and coffee and is taking Metamucil to aid bowel regularity.  She is scheduled for a sleep study on March 18th to investigate potential sleep disturbances.    Past Medical History:  Diagnosis Date   Anxiety    Arthritis    "left knee; left hip; lower back; mild to moderate in right hip" (09/30/2016)   Atrial fibrillation (HCC)    Chronic lower back pain    Gastritis    Heart palpitations    History of bone density study 2018   History of mammogram 2021   History of MRI 2018   History of Papanicolaou smear of cervix    Hypercholesteremia    Osteopenia    Seasonal allergies     Past Surgical History:  Procedure Laterality Date   ATRIAL FIBRILLATION ABLATION N/A 06/10/2022   Procedure: ATRIAL FIBRILLATION ABLATION;  Surgeon: Regan Lemming, MD;  [facility]: [facility];  Service: Cardiovascular;  Laterality: N/A;   CARDIOVERSION N/A 11/17/2019  Procedure: CARDIOVERSION;  Surgeon: Lewayne Bunting, MD;  Location: [facility] ENDOSCOPY;  Service: Cardiovascular;  Laterality: N/A;   CARDIOVERSION N/A 04/16/2021   Procedure: CARDIOVERSION;  Surgeon: Chilton Si, MD;  Location: [facility] ENDOSCOPY;  Service: Cardiovascular;  Laterality: N/A;   CARDIOVERSION N/A 08/15/2021   Procedure: CARDIOVERSION;  Surgeon: Maisie Fus, MD;  Location: [facility] ENDOSCOPY;  Service: Cardiovascular;  Laterality: N/A;   CARDIOVERSION N/A 05/25/2023   Procedure:  CARDIOVERSION;  Surgeon: Chrystie Nose, MD;  Location: [facility] INVASIVE CV LAB;  Service: Cardiovascular;  Laterality: N/A;   COLONOSCOPY     DILATION AND CURETTAGE OF UTERUS     JOINT REPLACEMENT     TONSILLECTOMY     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   Procedure: TOTAL HIP ARTHROPLASTY ANTERIOR APPROACH;  Surgeon: Jodi Geralds, MD;  Location: [facility] OR;  Service: Orthopedics;  Laterality: Left;    No Known Allergies  Outpatient Encounter Medications as of 09/23/2023  Medication Sig   amoxicillin (AMOXIL) 500 MG capsule Take 4 capsules (2,000 mg total) by mouth 1 hour prior to appointment.   apixaban (ELIQUIS) 5 MG TABS tablet Take 1 tablet (5 mg total) by mouth 2 (two) times daily.   Brimonidine Tartrate (LUMIFY) 0.025 % SOLN Place 1 drop into both eyes daily. Red eyes   Carboxymeth-Glyc-Polysorb PF (REFRESH OPTIVE MEGA-3) 0.5-1-0.5 % SOLN Place 1 drop into both eyes 4 (four) times daily as needed (dry eyes). Enhance with flax seed oil   cephALEXin (KEFLEX) 500 MG capsule Take 1 capsule (500 mg total) by mouth 2 (two) times daily.   diltiazem (CARDIZEM) 30 MG tablet Take 1 tablet every 4 hours AS NEEDED for AFIB heart rate >100 as long as top BP >100.   diphenhydrAMINE (BENADRYL) 25 MG tablet Take 1 tablet (25 mg total) by mouth every 8 (eight) hours as needed (headache).   hydrocortisone cream 0.5 % Apply 1 Application topically 2 (two) times daily as needed for itching.   ketotifen (ZADITOR) 0.025 % ophthalmic solution Place 1 drop into both eyes 2 (two) times daily as needed (itchy eyes). In the fall   loratadine (CLARITIN) 10 MG tablet Take 10 mg by mouth daily as needed for allergies.   mineral oil-hydrophilic petrolatum (AQUAPHOR) ointment Apply 1 Application topically as needed for dry skin.   Multiple Vitamin (ONE-DAILY MULTI VITAMINS PO) Take 30 mLs by mouth daily. [name] liquid vitamin   NON FORMULARY Take 1,000 mg by mouth daily. Plant calcium   OVER THE COUNTER MEDICATION Place  1 application  into both eyes at bedtime. Pure & Clean   prochlorperazine (COMPAZINE) 10 MG tablet Take 1 tablet (10 mg total) by mouth every 8 (eight) hours as needed (headache). And Nausea   rosuvastatin (CRESTOR) 10 MG tablet Take 1 tablet (10 mg total) by mouth daily.   SUMAtriptan (IMITREX) 50 MG tablet Take 1 tablet (50 mg total) by mouth daily as needed for migraine. May repeat in 2 hours if headache persists or recurs.   triamcinolone cream (KENALOG) 0.1 % Apply 1 Application topically 2 (two) times daily as needed (itching).   Vitamins C E (CRANBERRY URINARY COMFORT PO) Take by mouth.   [DISCONTINUED] SUMAtriptan (IMITREX) 25 MG tablet Take 1 tablet (25 mg total) by mouth every 2 (two) hours as needed for migraine. May repeat in 2 hours if headache persists or recurs.   acetaminophen (TYLENOL) 500 MG tablet Take 2 tablets (1,000 mg total) by mouth every 8 (eight) hours as needed.  No facility-administered encounter medications on file as of 09/23/2023.    Review of Systems:  Review of Systems  Constitutional:  Negative for appetite change, chills, fatigue and fever.  HENT:  Negative for congestion, hearing loss, rhinorrhea and sore throat.   Eyes: Negative.   Respiratory:  Negative for cough, shortness of breath and wheezing.   Cardiovascular:  Negative for chest pain, palpitations and leg swelling.  Gastrointestinal:  Negative for abdominal pain, constipation, diarrhea, nausea and vomiting.  Genitourinary:  Negative for dysuria.  Musculoskeletal:  Negative for arthralgias, back pain and myalgias.  Skin:  Negative for color change, rash and wound.  Neurological:  Positive for headaches. Negative for dizziness and weakness.  Psychiatric/Behavioral:  Negative for behavioral problems. The patient is not nervous/anxious.     Health Maintenance  Topic Date Due   COVID-19 Vaccine (8 - 2024-25 season) 08/03/2023   Medicare Annual Wellness (AWV)  04/30/2024   DTaP/Tdap/Td (4 - Td or  Tdap) 12/26/2027   Pneumonia Vaccine 26+ Years old  Completed   INFLUENZA VACCINE  Completed   DEXA SCAN  Completed   Zoster Vaccines- Shingrix  Completed   HPV VACCINES  Aged Out   Hepatitis C Screening  Discontinued    Physical Exam: Vitals:   09/23/23 1439  BP: 130/76  Pulse: 65  Resp: 16  Temp: (!) 97.3 F (36.3 C)  SpO2: 99%  Weight: 133 lb 6.4 oz (60.5 kg)  Height: 5\' 7"  (1.702 m)   Body mass index is 20.89 kg/m. Physical Exam Constitutional:      Appearance: Normal appearance.  HENT:     Head: Normocephalic and atraumatic.     Nose: Nose normal.     Mouth/Throat:     Mouth: Mucous membranes are moist.  Eyes:     Conjunctiva/sclera: Conjunctivae normal.  Cardiovascular:     Rate and Rhythm: Normal rate and regular rhythm.  Pulmonary:     Effort: Pulmonary effort is normal.     Breath sounds: Normal breath sounds.  Abdominal:     General: Bowel sounds are normal.     Palpations: Abdomen is soft.  Musculoskeletal:        General: Normal range of motion.     Cervical back: Normal range of motion.  Skin:    General: Skin is warm and dry.  Neurological:     General: No focal deficit present.     Mental Status: She is alert and oriented to person, place, and time.  Psychiatric:        Mood and Affect: Mood normal.        Behavior: Behavior normal.        Thought Content: Thought content normal.        Judgment: Judgment normal.     Labs reviewed: Basic Metabolic Panel: Recent Labs    12/18/22 0842 03/19/23 1004 08/11/23 0856 08/20/23 1645 08/27/23 1524  NA 142   < > 145 141 143  K 4.1   < > 4.2 3.9 4.0  CL 107   < > 108 105 108  CO2 29   < > 30 27 25   GLUCOSE 77   < > 74 107* 97  BUN 26*   < > 26* 18 19  CREATININE 0.75   < > 0.77 0.67 0.73  CALCIUM 9.1   < > 9.2 9.7 9.5  TSH 3.36  --  3.37  --   --    < > = values in this interval not  displayed.   Liver Function Tests: Recent Labs    03/19/23 1004 08/11/23 0856 08/20/23 1645  08/27/23 1524  AST 37 27 33 40  ALT 28 18 28 23   ALKPHOS 58  --  60 52  BILITOT 1.1 0.8 1.0 1.2  PROT 6.4* 6.1 6.3* 6.8  ALBUMIN 4.0  --  4.5 4.4   No results for input(s): "LIPASE", "AMYLASE" in the last 8760 hours. No results for input(s): "AMMONIA" in the last 8760 hours. CBC: Recent Labs    08/11/23 0856 08/20/23 1645 08/27/23 1524  WBC 3.8 6.5 5.5  NEUTROABS 1,588 4.5 3.0  HGB 14.3 14.9 15.0  HCT 43.4 45.1 45.7  MCV 98.0 97.0 100.7*  PLT 150 152 146*   Lipid Panel: Recent Labs    12/18/22 0842 08/11/23 0856  CHOL 144 153  HDL 64 57  LDLCALC 66 82  TRIG 59 67  CHOLHDL 2.3 2.7   No results found for: "HGBA1C"  Procedures since last visit: MR Brain W and Wo Contrast Result Date: 08/27/2023 CLINICAL DATA:  Headache, new onset (Age >= 51y) Headache, increasing frequency or severity EXAM: MRI HEAD WITHOUT AND WITH CONTRAST TECHNIQUE: Multiplanar, multiecho pulse sequences of the brain and surrounding structures were obtained without and with intravenous contrast. CONTRAST:  6mL GADAVIST GADOBUTROL 1 MMOL/ML IV SOLN COMPARISON:  MRI head May 6, 24 without report. CTA head/neck 08/20/2023. FINDINGS: Brain: No acute infarction, hemorrhage, hydrocephalus, or extra-axial collection. Mild for age scattered T2/FLAIR hyperintensities in the white matter, nonspecific but compatible with chronic microvascular ischemic disease. Approximately 1.5 cm extra-axial, dural-based lesion along the left frontal convexity frontal convexity, compatible with a meningioma. No significant mass effect or adjacent brain edema. Vascular: Major arterial flow voids are maintained at the skull base. Skull and upper cervical spine: Normal marrow signal. Sinuses/Orbits: Left maxillary sinus frothy secretions. No acute orbital findings. Other: No mastoid effusions. IMPRESSION: 1. No evidence of acute intracranial abnormality. 2. 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 3. Left maxillary sinus frothy secretions. Electronically Signed   By: Feliberto Harts M.D.   On: 08/27/2023 20:17    Assessment/Plan  1. Nonintractable episodic headache, unspecified headache type (Primary) -  Frequent migraines (5 in 6 days) effectively managed with Imitrex 50mg . Discussed the need for consistent dosing and the potential for a preventive approach given the frequency of migraines. -Continue Imitrex 50mg  at onset of migraine. -If headache persists after 2 hours, take another dose of Imitrex 50mg . -Discontinue use of Tylenol and Compazine unless necessary for additional symptoms (nausea). -Referral to [facility] for a second opinion and potential preventive treatment options. - Ambulatory referral to Neurology for a second opinion regarding - SUMAtriptan (IMITREX) 50 MG tablet; Take 1 tablet (50 mg total) by mouth daily as needed for migraine. May repeat in 2 hours if headache persists or recurs.  Dispense: 18 tablet; Refill: 3  2. Paroxysmal A-fib (HCC) Stable, no current need for rate control medication. -Continue Diltiazem PRN.   3. Sleep disturbance -Upcoming sleep study scheduled for March 18th to investigate potential sleep issues that may be contributing to the frequency of migraines. -Attend scheduled sleep study. -Follow-up appointment after the sleep study to discuss results and potential impact on migraine management.   Diet and Lifestyle Discussed the potential impact of diet on migraines and overall health. Patient has been losing weight and has a healthy BMI. -Incorporate more dark leafy greens, fresh fruits and vegetables,  legumes, whole grains, nuts, and seeds into diet. -Avoid red meat and maintain a low salt diet. -Continue with daily bowel movements, using Metamucil as needed for regularity.   Labs/tests ordered:   None   No follow-ups on file.  Kenard Gower, NP

## 2023-09-24 ENCOUNTER — Other Ambulatory Visit (HOSPITAL_COMMUNITY): Payer: Self-pay

## 2023-09-25 ENCOUNTER — Other Ambulatory Visit: Payer: Self-pay | Admitting: Adult Health

## 2023-09-25 DIAGNOSIS — R519 Headache, unspecified: Secondary | ICD-10-CM

## 2023-09-29 ENCOUNTER — Encounter: Payer: Self-pay | Admitting: Adult Health

## 2023-09-30 ENCOUNTER — Institutional Professional Consult (permissible substitution): Payer: PPO | Admitting: Neurology

## 2023-09-30 NOTE — Telephone Encounter (Addendum)
 Octavia Heir, NP  Debbora Lacrosse hour ago (11:24 AM)    Shelia [name] is on vacation this week. I recommend asking her about migraine management on your 03/10 appointment.   This MyChart message has not been read.  Left message on voicemail for patient to return call when available/ Spoke with patient and she will wait until to talk with Shelia [name], Shelia Banda, NP

## 2023-10-01 ENCOUNTER — Telehealth: Payer: Self-pay

## 2023-10-01 NOTE — Telephone Encounter (Addendum)
 Spoke with patient on yesterday and she will wait to talked to Gillis Santa, NP on Monday for her visit.  Copied from CRM [id number]. Topic: General - Call Back - No Documentation >> Oct 01, 2023  3:11 PM Shelia [name] wrote: Shelia [name] ple    Amy Norval Gable, NP  Debbora Lacrosse hour ago (11:24 AM)     Shelia [name] is on vacation this week. I recommend asking her about migraine management on your 03/10 appointment.   This MyChart message has not been read.  Left message on voicemail for patient to return call when available/ Spoke with patient and she will wait until to talk with Medina-Vargas, Monina C, NP      ase document call from yesterday and close.

## 2023-10-05 ENCOUNTER — Encounter: Payer: Self-pay | Admitting: Adult Health

## 2023-10-05 ENCOUNTER — Ambulatory Visit (INDEPENDENT_AMBULATORY_CARE_PROVIDER_SITE_OTHER): Payer: PPO | Admitting: Adult Health

## 2023-10-05 ENCOUNTER — Other Ambulatory Visit (HOSPITAL_COMMUNITY): Payer: Self-pay

## 2023-10-05 VITALS — BP 132/80 | HR 60 | Temp 97.9°F | Resp 18 | Ht 67.0 in | Wt 139.4 lb

## 2023-10-05 DIAGNOSIS — I480 Paroxysmal atrial fibrillation: Secondary | ICD-10-CM

## 2023-10-05 DIAGNOSIS — R519 Headache, unspecified: Secondary | ICD-10-CM | POA: Diagnosis not present

## 2023-10-05 DIAGNOSIS — G479 Sleep disorder, unspecified: Secondary | ICD-10-CM | POA: Diagnosis not present

## 2023-10-05 MED ORDER — TOPIRAMATE 25 MG PO TABS
25.0000 mg | ORAL_TABLET | Freq: Every day | ORAL | 3 refills | Status: DC
  Filled 2023-10-05: qty 30, 30d supply, fill #0
  Filled 2023-10-28: qty 30, 30d supply, fill #1

## 2023-10-05 MED ORDER — AMOXICILLIN 500 MG PO CAPS
2000.0000 mg | ORAL_CAPSULE | Freq: Once | ORAL | 0 refills | Status: AC
Start: 2023-10-05 — End: 2023-10-06
  Filled 2023-10-05: qty 4, 1d supply, fill #0

## 2023-10-05 NOTE — Progress Notes (Unsigned)
 [facility] clinic  Provider:  Kenard Gower DNP  Code Status:  Full Code  Goals of Care:     10/05/2023    3:40 PM  Advanced Directives  Does Patient Have a Medical Advance Directive? Yes  Type of Estate agent of [place];Living will;Out of facility DNR (pink MOST or yellow form)  Does patient want to make changes to medical advance directive? No - Patient declined  Copy of Healthcare Power of Attorney in Chart? Yes - validated most recent copy scanned in chart (See row information)  Would patient like information on creating a medical advance directive? No - Patient declined     Chief Complaint  Patient presents with   Follow-up    2 week follow up    Discussed the use of AI scribe software for clinical note transcription with the patient, who gave verbal consent to proceed.  HPI: Patient is a 83 y.o. female seen today for an acute visit for      Past Medical History:  Diagnosis Date   Anxiety    Arthritis    "left knee; left hip; lower back; mild to moderate in right hip" (09/30/2016)   Atrial fibrillation (HCC)    Chronic lower back pain    Gastritis    Heart palpitations    History of bone density study 2018   History of mammogram 2021   History of MRI 2018   History of Papanicolaou smear of cervix    Hypercholesteremia    Osteopenia    Seasonal allergies     Past Surgical History:  Procedure Laterality Date   ATRIAL FIBRILLATION ABLATION N/A 06/10/2022   Procedure: ATRIAL FIBRILLATION ABLATION;  Surgeon: Regan Lemming, MD;  Location: [facility] INVASIVE CV LAB;  Service: Cardiovascular;  Laterality: N/A;   CARDIOVERSION N/A 11/17/2019   Procedure: CARDIOVERSION;  Surgeon: Lewayne Bunting, MD;  Location: [facility] ENDOSCOPY;  Service: Cardiovascular;  Laterality: N/A;   CARDIOVERSION N/A 04/16/2021   Procedure: CARDIOVERSION;  Surgeon: Chilton Si, MD;  Location: [facility] ENDOSCOPY;  Service: Cardiovascular;  Laterality: N/A;    CARDIOVERSION N/A 08/15/2021   Procedure: CARDIOVERSION;  Surgeon: Maisie Fus, MD;  Location: [facility] ENDOSCOPY;  Service: Cardiovascular;  Laterality: N/A;   CARDIOVERSION N/A 05/25/2023   Procedure: CARDIOVERSION;  Surgeon: Chrystie Nose, MD;  Location: [facility] INVASIVE CV LAB;  Service: Cardiovascular;  Laterality: N/A;   COLONOSCOPY     DILATION AND CURETTAGE OF UTERUS     JOINT REPLACEMENT     TONSILLECTOMY     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   Procedure: TOTAL HIP ARTHROPLASTY ANTERIOR APPROACH;  Surgeon: Jodi Geralds, MD;  Location: [facility] OR;  Service: Orthopedics;  Laterality: Left;    No Known Allergies  Outpatient Encounter Medications as of 10/05/2023  Medication Sig   acetaminophen (TYLENOL) 500 MG tablet Take 2 tablets (1,000 mg total) by mouth every 8 (eight) hours as needed.   amoxicillin (AMOXIL) 500 MG capsule Take 4 capsules (2,000 mg total) by mouth 1 hour prior to appointment.   amoxicillin (AMOXIL) 500 MG capsule Take 4 capsules (2,000 mg total) by mouth once 1 hour before appt.   apixaban (ELIQUIS) 5 MG TABS tablet Take 1 tablet (5 mg total) by mouth 2 (two) times daily.   Brimonidine Tartrate (LUMIFY) 0.025 % SOLN Place 1 drop into both eyes daily. Red eyes   Carboxymeth-Glyc-Polysorb PF (REFRESH OPTIVE MEGA-3) 0.5-1-0.5 % SOLN Place 1 drop into both eyes 4 (four) times daily as needed (  dry eyes). Enhance with flax seed oil   diltiazem (CARDIZEM) 30 MG tablet Take 1 tablet every 4 hours AS NEEDED for AFIB heart rate >100 as long as top BP >100.   hydrocortisone cream 0.5 % Apply 1 Application topically 2 (two) times daily as needed for itching.   ketotifen (ZADITOR) 0.025 % ophthalmic solution Place 1 drop into both eyes 2 (two) times daily as needed (itchy eyes). In the fall   loratadine (CLARITIN) 10 MG tablet Take 10 mg by mouth daily as needed for allergies.   mineral oil-hydrophilic petrolatum (AQUAPHOR) ointment Apply 1 Application topically as needed for dry skin.    Multiple Vitamin (ONE-DAILY MULTI VITAMINS PO) Take 30 mLs by mouth daily. [name] liquid vitamin   OVER THE COUNTER MEDICATION Place 1 application  into both eyes at bedtime. Pure & Clean   rosuvastatin (CRESTOR) 10 MG tablet Take 1 tablet (10 mg total) by mouth daily.   topiramate (TOPAMAX) 25 MG tablet Take 1 tablet (25 mg total) by mouth daily.   triamcinolone cream (KENALOG) 0.1 % Apply 1 Application topically 2 (two) times daily as needed (itching).   Vitamins C E (CRANBERRY URINARY COMFORT PO) Take by mouth.   [DISCONTINUED] SUMAtriptan (IMITREX) 50 MG tablet Take 1 tablet (50 mg total) by mouth daily as needed for migraine. May repeat in 2 hours if headache persists or recurs.   diphenhydrAMINE (BENADRYL) 25 MG tablet Take 1 tablet (25 mg total) by mouth every 8 (eight) hours as needed (headache). (Patient not taking: Reported on 10/05/2023)   NON FORMULARY Take 1,000 mg by mouth daily. Plant calcium (Patient not taking: Reported on 10/05/2023)   [DISCONTINUED] cephALEXin (KEFLEX) 500 MG capsule Take 1 capsule (500 mg total) by mouth 2 (two) times daily. (Patient not taking: Reported on 10/05/2023)   [DISCONTINUED] prochlorperazine (COMPAZINE) 10 MG tablet Take 1 tablet (10 mg total) by mouth every 8 (eight) hours as needed (headache). And Nausea (Patient not taking: Reported on 10/05/2023)   No facility-administered encounter medications on file as of 10/05/2023.    Review of Systems:  Review of Systems  Constitutional:  Negative for appetite change, chills, fatigue and fever.  HENT:  Negative for congestion, hearing loss, rhinorrhea and sore throat.   Eyes: Negative.   Respiratory:  Negative for cough, shortness of breath and wheezing.   Cardiovascular:  Negative for chest pain, palpitations and leg swelling.  Gastrointestinal:  Negative for abdominal pain, constipation, diarrhea, nausea and vomiting.  Genitourinary:  Negative for dysuria.  Musculoskeletal:  Negative for  arthralgias, back pain and myalgias.  Skin:  Negative for color change, rash and wound.  Neurological:  Positive for headaches. Negative for dizziness and weakness.  Psychiatric/Behavioral:  Negative for behavioral problems. The patient is not nervous/anxious.     Health Maintenance  Topic Date Due   COVID-19 Vaccine (8 - 2024-25 season) 08/03/2023   Medicare Annual Wellness (AWV)  04/30/2024   DTaP/Tdap/Td (4 - Td or Tdap) 12/26/2027   Pneumonia Vaccine 46+ Years old  Completed   INFLUENZA VACCINE  Completed   DEXA SCAN  Completed   Zoster Vaccines- Shingrix  Completed   HPV VACCINES  Aged Out   Hepatitis C Screening  Discontinued    Physical Exam: Vitals:   10/05/23 1545  BP: 132/80  Pulse: 60  Resp: 18  Temp: 97.9 F (36.6 C)  SpO2: 99%  Weight: 139 lb 6.4 oz (63.2 kg)  Height: 5\' 7"  (1.702 m)   Body mass  index is 21.83 kg/m. Physical Exam Constitutional:      Appearance: Normal appearance.  HENT:     Head: Normocephalic and atraumatic.     Nose: Nose normal.     Mouth/Throat:     Mouth: Mucous membranes are moist.  Eyes:     Conjunctiva/sclera: Conjunctivae normal.  Cardiovascular:     Rate and Rhythm: Normal rate and regular rhythm.  Pulmonary:     Effort: Pulmonary effort is normal.     Breath sounds: Normal breath sounds.  Abdominal:     General: Bowel sounds are normal.     Palpations: Abdomen is soft.  Musculoskeletal:        General: Normal range of motion.     Cervical back: Normal range of motion.  Skin:    General: Skin is warm and dry.  Neurological:     General: No focal deficit present.     Mental Status: She is alert and oriented to person, place, and time.  Psychiatric:        Mood and Affect: Mood normal.        Behavior: Behavior normal.        Thought Content: Thought content normal.        Judgment: Judgment normal.     Labs reviewed: Basic Metabolic Panel: Recent Labs    12/18/22 0842 03/19/23 1004 08/11/23 0856  08/20/23 1645 08/27/23 1524  NA 142   < > 145 141 143  K 4.1   < > 4.2 3.9 4.0  CL 107   < > 108 105 108  CO2 29   < > 30 27 25   GLUCOSE 77   < > 74 107* 97  BUN 26*   < > 26* 18 19  CREATININE 0.75   < > 0.77 0.67 0.73  CALCIUM 9.1   < > 9.2 9.7 9.5  TSH 3.36  --  3.37  --   --    < > = values in this interval not displayed.   Liver Function Tests: Recent Labs    03/19/23 1004 08/11/23 0856 08/20/23 1645 08/27/23 1524  AST 37 27 33 40  ALT 28 18 28 23   ALKPHOS 58  --  60 52  BILITOT 1.1 0.8 1.0 1.2  PROT 6.4* 6.1 6.3* 6.8  ALBUMIN 4.0  --  4.5 4.4   No results for input(s): "LIPASE", "AMYLASE" in the last 8760 hours. No results for input(s): "AMMONIA" in the last 8760 hours. CBC: Recent Labs    08/11/23 0856 08/20/23 1645 08/27/23 1524  WBC 3.8 6.5 5.5  NEUTROABS 1,588 4.5 3.0  HGB 14.3 14.9 15.0  HCT 43.4 45.1 45.7  MCV 98.0 97.0 100.7*  PLT 150 152 146*   Lipid Panel: Recent Labs    12/18/22 0842 08/11/23 0856  CHOL 144 153  HDL 64 57  LDLCALC 66 82  TRIG 59 67  CHOLHDL 2.3 2.7   No results found for: "HGBA1C"  Procedures since last visit: No results found.  Assessment/Plan     Labs/tests ordered:   hepatic panel   No follow-ups on file.  Kenard Gower, NP

## 2023-10-06 DIAGNOSIS — Q828 Other specified congenital malformations of skin: Secondary | ICD-10-CM | POA: Diagnosis not present

## 2023-10-06 DIAGNOSIS — S9031XA Contusion of right foot, initial encounter: Secondary | ICD-10-CM | POA: Diagnosis not present

## 2023-10-06 DIAGNOSIS — D2371 Other benign neoplasm of skin of right lower limb, including hip: Secondary | ICD-10-CM | POA: Diagnosis not present

## 2023-10-06 DIAGNOSIS — L565 Disseminated superficial actinic porokeratosis (DSAP): Secondary | ICD-10-CM | POA: Diagnosis not present

## 2023-10-06 DIAGNOSIS — M21612 Bunion of left foot: Secondary | ICD-10-CM | POA: Diagnosis not present

## 2023-10-06 DIAGNOSIS — M2041 Other hammer toe(s) (acquired), right foot: Secondary | ICD-10-CM | POA: Diagnosis not present

## 2023-10-06 DIAGNOSIS — M21611 Bunion of right foot: Secondary | ICD-10-CM | POA: Diagnosis not present

## 2023-10-06 LAB — HEPATIC FUNCTION PANEL
AG Ratio: 2.6 (calc) — ABNORMAL HIGH (ref 1.0–2.5)
ALT: 19 U/L (ref 6–29)
AST: 29 U/L (ref 10–35)
Albumin: 4.1 g/dL (ref 3.6–5.1)
Alkaline phosphatase (APISO): 60 U/L (ref 37–153)
Bilirubin, Direct: 0.2 mg/dL (ref 0.0–0.2)
Globulin: 1.6 g/dL — ABNORMAL LOW (ref 1.9–3.7)
Indirect Bilirubin: 0.4 mg/dL (ref 0.2–1.2)
Total Bilirubin: 0.6 mg/dL (ref 0.2–1.2)
Total Protein: 5.7 g/dL — ABNORMAL LOW (ref 6.1–8.1)

## 2023-10-06 NOTE — Progress Notes (Signed)
-    liver enzymes normal -  total protein is slightly low, need to increase protein intake, may take ensure, boost, etc.

## 2023-10-08 ENCOUNTER — Telehealth: Payer: Self-pay

## 2023-10-08 NOTE — Telephone Encounter (Signed)
 Copied from CRM [id number]. Topic: Clinical - Lab/Test Results >> Oct 08, 2023  2:31 PM Maree Krabbe H wrote: Reason for CRM: Read patient her lab results, patient does have some more questions and would like to be called back at [phone].

## 2023-10-08 NOTE — Telephone Encounter (Signed)
 Please refer to lab results.

## 2023-10-09 ENCOUNTER — Other Ambulatory Visit: Payer: Self-pay | Admitting: Adult Health

## 2023-10-09 ENCOUNTER — Telehealth: Payer: Self-pay

## 2023-10-09 DIAGNOSIS — R7989 Other specified abnormal findings of blood chemistry: Secondary | ICD-10-CM

## 2023-10-09 NOTE — Telephone Encounter (Signed)
 Copied from CRM [id number]. Topic: Clinical - Medical Advice >> Oct 09, 2023  1:21 PM Alona Bene A wrote: Reason for CRM: Patient called in requesting to speak with provider regarding headache. Patient would like call back at [phone]. Patient wants to know if we are able to send something for headache to pharmacy for her.  Spoke with patient this afternoon, patient want to give an update to Medina-Vargas, Monina C, NP to let her know that the medications is working but still having headaches.  Patient also did states that she did take tylenol at night but it does not help.  I did explain to the patient as it was discussed at the last appointment with Medina-Vargas, Monina C, NP per instruction during the assessment of the visit.  Per Medina-Vargas, Monina C, NP response   Nonin tractable episodic headache, unspecified headache type (Primary)-  Frequent migraines requiring Imitrex. Discussed the need for a daily preventive medication and the patient's desire to switch from abortive to preventive therapy. -Discontinue Imitrex. -Start Topamax 25mg  daily. -Plan to increase to 50mg  daily in 2 weeks if tolerated and if headaches persist.   Message sent to Medina-Vargas, Margit Banda, NP

## 2023-10-13 ENCOUNTER — Ambulatory Visit (INDEPENDENT_AMBULATORY_CARE_PROVIDER_SITE_OTHER): Payer: PPO | Admitting: Neurology

## 2023-10-13 DIAGNOSIS — G478 Other sleep disorders: Secondary | ICD-10-CM

## 2023-10-13 DIAGNOSIS — R9431 Abnormal electrocardiogram [ECG] [EKG]: Secondary | ICD-10-CM

## 2023-10-13 DIAGNOSIS — Z9189 Other specified personal risk factors, not elsewhere classified: Secondary | ICD-10-CM

## 2023-10-13 DIAGNOSIS — R519 Headache, unspecified: Secondary | ICD-10-CM

## 2023-10-13 DIAGNOSIS — I4819 Other persistent atrial fibrillation: Secondary | ICD-10-CM

## 2023-10-13 DIAGNOSIS — G4720 Circadian rhythm sleep disorder, unspecified type: Secondary | ICD-10-CM

## 2023-10-14 ENCOUNTER — Encounter: Payer: Self-pay | Admitting: Neurology

## 2023-10-14 NOTE — Procedures (Signed)
 Physician Interpretation:     [facility] POLYSOMNOGRAPHY  INTERPRETATION REPORT   STUDY DATE:  10/13/2023     PATIENT NAME:  Shelia [name]         DATE OF BIRTH:  [birth year]  PATIENT ID:  [id number]    TYPE OF STUDY:  PSG  READING PHYSICIAN: Huston Foley, MD, PhD   SCORING TECHNICIAN: Margaretann Loveless, RPSGT     Referred by: Richarda Blade, NP ? History and Indication for Testing: 83 year old female with an underlying medical history of cerebral meningioma, atrial fibrillation, on Eliquis, status post cardioversion and cardiac ablation, allergies, osteopenia, hyperlipidemia, arthritis, and anxiety, who reports recurrent headaches. Headaches are intermittent, sometimes worse at night, sometimes she has trouble sleeping through the night.  Height: 67 in Weight: 137 lb (BMI 21) Neck Size: 0   MEDICATIONS: Tylenol, Eliquis, Lumify, Refresh Optive Mega-3, Keflex, Benadryl, Hydrocortisone cream, Zaditor, Claritin, Multiple Vitamin, Non Formulary, Compazine, Crestor, Kenalog, Vitamins C E   TECHNICAL DESCRIPTION: A registered sleep technologist was in attendance for the duration of the recording.  Data collection, scoring, video monitoring, and reporting were performed in compliance with the AASM Manual for the Scoring of Sleep and Associated Events; (Hypopnea is scored based on the criteria listed in Section VIII D. 1b in the AASM Manual V2.6 using a 4% oxygen desaturation rule or Hypopnea is scored based on the criteria listed in Section VIII D. 1a in the AASM Manual V2.6 using 3% oxygen desaturation and /or arousal rule).   SLEEP CONTINUITY AND SLEEP ARCHITECTURE:  Lights-out was at 22:01: and lights-on at  05:06:, with a total recording time of 7 hours 5.5 min. Total sleep time ( TST) was 316.5 minutes with a decreased sleep efficiency at 74.4%. There was  7.9% REM sleep.  BODY POSITION:  TST was divided  between the following sleep positions: 43.3% supine;   56.7% lateral;  0% prone. Duration of total sleep and percent of total sleep in their respective position is as follows: supine 137 minutes (43%), non-supine 180 minutes (57%); right 61 minutes (19%), left 118 minutes (37%), and prone 00 minutes (0%).  Total supine REM sleep time was 00 minutes (0% of total REM sleep). Sleep latency was normal at 7.0 minutes.  REM sleep latency was increased at 132.0 minutes. Of the total sleep time, the percentage of stage N1 sleep was 2.7%, stage N2 sleep was 56%, which is near-normal, stage N3 sleep was 33.8%, which is increased, and REM sleep was 7.9%, which is reduced. Wake after sleep onset (WASO) time accounted for 102 minutes with mild to moderate sleep fragmentation noted.   RESPIRATORY MONITORING:   Based on CMS criteria (using a 4% oxygen desaturation rule for scoring hypopneas), there were 0 apneas (0 obstructive; 0 central; 0 mixed), and 3 hypopneas.  Apnea index was 0.0. Hypopnea index was 0.6. The apnea-hypopnea index was 0.6/hour overall (1.3 supine, 0 non-supine; 0.0 REM, 0.0 supine REM).  There were 0 respiratory effort-related arousals (RERAs).  The RERA index was 0 events/h. Total respiratory disturbance index (RDI) was 0.6 events/h. RDI results showed: supine RDI  1.3 /h; non-supine RDI 0.0 /h; REM RDI 0.0 /h, supine REM RDI 0.0 /h.   Based on AASM criteria (using a 3% oxygen desaturation and /or arousal rule for scoring hypopneas), there were 0 apneas (0 obstructive; 0 central; 0 mixed), and 10 hypopneas. Apnea index was 0.0. Hypopnea index was 1.9. The apnea-hypopnea index was 1.9 overall (4.4 supine, 0 non-supine;  0.0 REM, 0.0 supine REM).  There were 0 respiratory effort-related arousals (RERAs).  The RERA index was 0 events/h. Total respiratory disturbance index (RDI) was 1.9 events/h. RDI results showed: supine RDI  4.4 /h; non-supine RDI 0.0 /h; REM RDI 0.0 /h, supine REM RDI 0.0 /h.    OXIMETRY: Oxyhemoglobin Saturation Nadir during sleep  was at  93%) from a mean of 97%.  Of the Total sleep time (TST)   hypoxemia (=<88%) was present for  0.2 minutes, or 0.0% of total sleep time.    LIMB MOVEMENTS: There were 0 periodic limb movements of sleep (0.0/hr), of which 0 (0.0/hr) were associated with an arousal.   AROUSAL: There were 32 arousals in total, for an arousal index of 6 arousals/hour.  Of these, 6 were identified as respiratory-related arousals (1 /h), 0 were PLM-related arousals (0 /h), and 39 were non-specific arousals (7 /h).   EEG: Review of the EEG showed no abnormal electrical discharges and symmetrical bihemispheric findings.    EKG: The EKG revealed bradycardia, with likely atrial fibrillation and occasional PVCs (premature ventricular contractions). The average heart rate during sleep was 47 bpm.   AUDIO/VIDEO REVIEW: The audio and video review did not show any abnormal or unusual behaviors, movements, phonations or vocalizations. The patient took 1 restroom break. No audible s3noring was noted.   POST-STUDY QUESTIONNAIRE: Post study, the patient indicated, that sleep was the same as usual.   IMPRESSION:  1. Dysfunctions associated with sleep stages or arousal from sleep 2. Non-specific abnormal electrocardiogram (EKG)   RECOMMENDATIONS:  1. This study does not demonstrate any significant obstructive or central sleep disordered breathing with an AHI of less than 5/hour - her AHI was 0.6/hour - and oxygen saturations remained at or above 89% for the night. No significant snoring was noted. Treatment with a positive airway pressure device, such as CPAP or autoPAP is not indicated.   2. This study shows some sleep fragmentation and abnormal sleep stage percentages; these are nonspecific findings and per se do not signify an intrinsic sleep disorder or a cause for the patient's sleep-related symptoms. Causes include (but are not limited to) the first night effect of the sleep study, circadian rhythm disturbances,  medication effect or an underlying mood disorder or medical problem.  3. The patient should be cautioned not to drive, work at heights, or operate dangerous or heavy equipment when tired or sleepy. Review and reiteration of good sleep hygiene measures should be pursued with any patient. 4. The study showed bradycardia, a mildly irregular rhythm with likely atrial fibrillation (known history) and occasional PVCs on single lead EKG; clinical correlation is recommended. The patient is well-known to cardiology. 5. The patient will be advised to follow up with the referring provider, who will be notified of the test results.  I certify that I have reviewed the entire raw data recording prior to the issuance of this report in accordance with the Standards of Accreditation of the American Academy of Sleep Medicine (AASM).  Huston Foley, MD, PhD Medical Director, [facility]) Diplomat, ABPN (Neurology and Sleep)               Technical Report:   General Information  Name: Shelia [name], Shelia [name] BMI: 16.10 Physician: Huston Foley, MD  ID: [id number] Height: 67.0 in Technician: Margaretann Loveless, RPSGT  Sex: Female Weight: 137.0 lb Record: [id number]  Age: 8 [01/24/1941] Date: 10/13/2023    Medical & Medication History    83 year old female  with an underlying medical history of cerebral meningioma, atrial fibrillation, on Eliquis, status post cardioversion and cardiac ablation, allergies, osteopenia, hyperlipidemia, arthritis, and anxiety, who reports recurrent headaches for the past approximately 2 weeks. She has had some nasal drainage from the right side intermittently. Headaches are intermittent, sometimes worse at night, sometimes she has trouble sleeping through the night. She does not have a prior history of headaches or migraines. She denies any sudden onset of one-sided weakness or numbness or tingling or droopy face or slurring of speech, no recent  falls. She did get a prescription for Compazine and Benadryl through the ER and has taken these medicines at night without any significant improvement. She has used Tylenol over-the-counter. Tylenol, Eliquis, Lumify, Refresh Optive Mega-3, Keflex, Benadryl, Hydrocortisone cream, Zaditor, Claritin, Multiple Vitamin, Non Formulary, Compazine, Crestor, Kenalog, Vitamins C E   Sleep Disorder      Comments   The patient came into the lab for a PSG. All sleep stages witnessed. Respiratory events scored with a 4% desat. Slept lateral and supine. AHI was 0.5 after 2 hrs of TST. No snoring. One bathroom break. EKG abnormal. Patient has known cardiac history.     Lights out: 10:01:30 PM Lights on: 05:06:36 AM   Time Total Supine Side Prone Upright  Recording (TRT) 7h 5.38m 3h 24.28m 3h 41.85m 0h 0.68m 0h 0.45m  Sleep (TST) 5h 16.11m 2h 17.82m 2h 59.20m 0h 0.37m 0h 0.75m   Latency N1 N2 N3 REM Onset Per. Slp. Eff.  Actual 0h 0.76m 0h 1.1m 0h 29.11m 2h 12.31m 0h 7.51m 0h 29.34m 74.38%   Stg Dur Wake N1 N2 N3 REM  Total 109.0 8.5 176.0 107.0 25.0  Supine 67.5 5.0 92.5 39.5 0.0  Side 41.5 3.5 83.5 67.5 25.0  Prone 0.0 0.0 0.0 0.0 0.0  Upright 0.0 0.0 0.0 0.0 0.0   Stg % Wake N1 N2 N3 REM  Total 25.6 2.7 55.6 33.8 7.9  Supine 15.9 1.6 29.2 12.5 0.0  Side 9.8 1.1 26.4 21.3 7.9  Prone 0.0 0.0 0.0 0.0 0.0  Upright 0.0 0.0 0.0 0.0 0.0     Apnea Summary Sub Supine Side Prone Upright  Total 0 Total 0 0 0 0 0    REM 0 0 0 0 0    NREM 0 0 0 0 0  Obs 0 REM 0 0 0 0 0    NREM 0 0 0 0 0  Mix 0 REM 0 0 0 0 0    NREM 0 0 0 0 0  Cen 0 REM 0 0 0 0 0    NREM 0 0 0 0 0   Rera Summary Sub Supine Side Prone Upright  Total 0 Total 0 0 0 0 0    REM 0 0 0 0 0    NREM 0 0 0 0 0   Hypopnea Summary Sub Supine Side Prone Upright  Total 10 Total 10 10 0 0 0    REM 0 0 0 0 0    NREM 10 10 0 0 0   4% Hypopnea Summary Sub Supine Side Prone Upright  Total (4%) 3 Total 3 3 0 0 0    REM 0 0 0 0 0    NREM 3 3 0 0 0     AHI  Total Obs Mix Cen  1.90 Apnea 0.00 0.00 0.00 0.00   Hypopnea 1.90 -- -- --  0.57 Hypopnea (4%) 0.57 -- -- --    Total Supine Side  Prone Upright  Position AHI 1.90 4.38 0.00 0.00 0.00  REM AHI 0.00   NREM AHI 2.06   Position RDI 1.90 4.38 0.00 0.00 0.00  REM RDI 0.00   NREM RDI 2.06    4% Hypopnea Total Supine Side Prone Upright  Position AHI (4%) 0.57 1.31 0.00 0.00 0.00  REM AHI (4%) 0.00   NREM AHI (4%) 0.62   Position RDI (4%) 0.57 1.31 0.00 0.00 0.00  REM RDI (4%) 0.00   NREM RDI (4%) 0.62    Desaturation Information Threshold: 2% <100% <90% <80% <70% <60% <50% <40%  Supine 39.0 2.0 0.0 0.0 0.0 0.0 0.0  Side 17.0 0.0 0.0 0.0 0.0 0.0 0.0  Prone 0.0 0.0 0.0 0.0 0.0 0.0 0.0  Upright 0.0 0.0 0.0 0.0 0.0 0.0 0.0  Total 56.0 2.0 0.0 0.0 0.0 0.0 0.0  Index 8.0 0.3 0.0 0.0 0.0 0.0 0.0   Threshold: 3% <100% <90% <80% <70% <60% <50% <40%  Supine 17.0 2.0 0.0 0.0 0.0 0.0 0.0  Side 3.0 0.0 0.0 0.0 0.0 0.0 0.0  Prone 0.0 0.0 0.0 0.0 0.0 0.0 0.0  Upright 0.0 0.0 0.0 0.0 0.0 0.0 0.0  Total 20.0 2.0 0.0 0.0 0.0 0.0 0.0  Index 2.9 0.3 0.0 0.0 0.0 0.0 0.0   Threshold: 4% <100% <90% <80% <70% <60% <50% <40%  Supine 6.0 2.0 0.0 0.0 0.0 0.0 0.0  Side 0.0 0.0 0.0 0.0 0.0 0.0 0.0  Prone 0.0 0.0 0.0 0.0 0.0 0.0 0.0  Upright 0.0 0.0 0.0 0.0 0.0 0.0 0.0  Total 6.0 2.0 0.0 0.0 0.0 0.0 0.0  Index 0.9 0.3 0.0 0.0 0.0 0.0 0.0   Threshold: 3% <100% <90% <80% <70% <60% <50% <40%  Supine 17 2 0 0 0 0 0  Side 3 0 0 0 0 0 0  Prone 0 0 0 0 0 0 0  Upright 0 0 0 0 0 0 0  Total 20 2 0 0 0 0 0   Awakening/Arousal Information # of Awakenings 26  Wake after sleep onset 102.52m  Wake after persistent sleep 93.54m   Arousal Assoc. Arousals Index  Apneas 0 0.0  Hypopneas 6 1.1  Leg Movements 0 0.0  Snore 0 0.0  PTT Arousals 0 0.0  Spontaneous 39 7.4  Total 45 8.5  Leg Movement Information PLMS LMs Index  Total LMs during PLMS 0 0.0  LMs w/ Microarousals 0 0.0   LM LMs Index  w/  Microarousal 0 0.0  w/ Awakening 0 0.0  w/ Resp Event 0 0.0  Spontaneous 0 0.0  Total 0 0.0     Desaturation threshold setting: 3% Minimum desaturation setting: 10 seconds SaO2 nadir: 81% The longest event was a 44 sec obstructive Hypopnea with a minimum SaO2 of 89%. The lowest SaO2 was 89% associated with a 44 sec obstructive Hypopnea. EKG Rates EKG Avg Max Min  Awake 49 92 41  Asleep 47 87 39  EKG Events: N/A

## 2023-10-15 DIAGNOSIS — Z6821 Body mass index (BMI) 21.0-21.9, adult: Secondary | ICD-10-CM | POA: Diagnosis not present

## 2023-10-15 DIAGNOSIS — Z01419 Encounter for gynecological examination (general) (routine) without abnormal findings: Secondary | ICD-10-CM | POA: Diagnosis not present

## 2023-10-16 ENCOUNTER — Other Ambulatory Visit (HOSPITAL_COMMUNITY): Payer: Self-pay

## 2023-10-19 ENCOUNTER — Ambulatory Visit (INDEPENDENT_AMBULATORY_CARE_PROVIDER_SITE_OTHER): Admitting: Adult Health

## 2023-10-19 ENCOUNTER — Other Ambulatory Visit: Payer: Self-pay | Admitting: Cardiology

## 2023-10-19 ENCOUNTER — Encounter: Payer: Self-pay | Admitting: Adult Health

## 2023-10-19 VITALS — BP 116/87 | HR 46 | Temp 96.6°F | Resp 18 | Ht 67.0 in | Wt 135.4 lb

## 2023-10-19 DIAGNOSIS — J302 Other seasonal allergic rhinitis: Secondary | ICD-10-CM

## 2023-10-19 DIAGNOSIS — K5901 Slow transit constipation: Secondary | ICD-10-CM | POA: Diagnosis not present

## 2023-10-19 DIAGNOSIS — I480 Paroxysmal atrial fibrillation: Secondary | ICD-10-CM | POA: Diagnosis not present

## 2023-10-19 DIAGNOSIS — R519 Headache, unspecified: Secondary | ICD-10-CM

## 2023-10-19 MED ORDER — CETIRIZINE HCL 10 MG PO TABS: 10.0000 mg | ORAL_TABLET | Freq: Every day | ORAL | Status: AC | PRN

## 2023-10-19 NOTE — Progress Notes (Signed)
 [facility] clinic  Provider:  Kenard Gower DNP  Code Status:  Full Code  Goals of Care:     10/05/2023    3:40 PM  Advanced Directives  Does Patient Have a Medical Advance Directive? Yes  Type of Estate agent of [place];Living will;Out of facility DNR (pink MOST or yellow form)  Does patient want to make changes to medical advance directive? No - Patient declined  Copy of Healthcare Power of Attorney in Chart? Yes - validated most recent copy scanned in chart (See row information)  Would patient like information on creating a medical advance directive? No - Patient declined     Chief Complaint  Patient presents with   Medical Management of Chronic Issues     Return in about 2 weeks    Discussed the use of AI scribe software for clinical note transcription with the patient, who gave verbal consent to proceed.  HPI: Patient is a 83 y.o. female seen today for a 2-week follow up of headache management. She was accompanied today by her husband.  She has not experienced any headaches since October 13, 2023. She has been taking Topamax 25 mg daily since October 05, 2023, and finds it effective in preventing migraines. Her sister suggested increasing the dose to 50 mg to further decrease migraine frequency, but she prefers to maintain the current dose as it is effective.  She has a history of atrial fibrillation and takes Eliquis regularly. She does not take diltiazem unless she experiences palpitations, which have not occurred recently.  She experiences a runny nose, which she attributes to allergies. She takes Claritin daily but finds it ineffective for her symptoms. She previously used cetirizine, which was effective, and plans to switch back to it.  She takes Metamucil daily for bowel regularity but reports it has been less effective in the past few days. She has a daily bowel movement but describes them as not 'great'.  She engages in daily walks and uses  weights at home for exercise. She used to be more active but has been less so due to headaches. She lives in a house with stairs, which she uses regularly.    Past Medical History:  Diagnosis Date   Anxiety    Arthritis    "left knee; left hip; lower back; mild to moderate in right hip" (09/30/2016)   Atrial fibrillation (HCC)    Chronic lower back pain    Gastritis    Heart palpitations    History of bone density study 2018   History of mammogram 2021   History of MRI 2018   History of Papanicolaou smear of cervix    Hypercholesteremia    Osteopenia    Seasonal allergies     Past Surgical History:  Procedure Laterality Date   ATRIAL FIBRILLATION ABLATION N/A 06/10/2022   Procedure: ATRIAL FIBRILLATION ABLATION;  Surgeon: Regan Lemming, MD;  Location: [facility] INVASIVE CV LAB;  Service: Cardiovascular;  Laterality: N/A;   CARDIOVERSION N/A 11/17/2019   Procedure: CARDIOVERSION;  Surgeon: Lewayne Bunting, MD;  Location: [facility] ENDOSCOPY;  Service: Cardiovascular;  Laterality: N/A;   CARDIOVERSION N/A 04/16/2021   Procedure: CARDIOVERSION;  Surgeon: Chilton Si, MD;  Location: [facility] ENDOSCOPY;  Service: Cardiovascular;  Laterality: N/A;   CARDIOVERSION N/A 08/15/2021   Procedure: CARDIOVERSION;  Surgeon: Maisie Fus, MD;  Location: [facility] ENDOSCOPY;  Service: Cardiovascular;  Laterality: N/A;   CARDIOVERSION N/A 05/25/2023   Procedure: CARDIOVERSION;  Surgeon: Chrystie Nose, MD;  Location: [facility] INVASIVE CV LAB;  Service: Cardiovascular;  Laterality: N/A;   COLONOSCOPY     DILATION AND CURETTAGE OF UTERUS     JOINT REPLACEMENT     TONSILLECTOMY     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   Procedure: TOTAL HIP ARTHROPLASTY ANTERIOR APPROACH;  Surgeon: Jodi Geralds, MD;  Location: [facility] OR;  Service: Orthopedics;  Laterality: Left;    No Known Allergies  Outpatient Encounter Medications as of 10/19/2023  Medication Sig   acetaminophen (TYLENOL) 500 MG tablet Take 2 tablets (1,000 mg  total) by mouth every 8 (eight) hours as needed.   amoxicillin (AMOXIL) 500 MG capsule Take 4 capsules (2,000 mg total) by mouth 1 hour prior to appointment.   apixaban (ELIQUIS) 5 MG TABS tablet Take 1 tablet (5 mg total) by mouth 2 (two) times daily.   Brimonidine Tartrate (LUMIFY) 0.025 % SOLN Place 1 drop into both eyes daily. Red eyes   Carboxymeth-Glyc-Polysorb PF (REFRESH OPTIVE MEGA-3) 0.5-1-0.5 % SOLN Place 1 drop into both eyes 4 (four) times daily as needed (dry eyes). Enhance with flax seed oil   cetirizine (ZYRTEC) 10 MG tablet Take 1 tablet (10 mg total) by mouth daily as needed for allergies.   diltiazem (CARDIZEM) 30 MG tablet Take 1 tablet every 4 hours AS NEEDED for AFIB heart rate >100 as long as top BP >100.   hydrocortisone cream 0.5 % Apply 1 Application topically 2 (two) times daily as needed for itching.   ketotifen (ZADITOR) 0.025 % ophthalmic solution Place 1 drop into both eyes 2 (two) times daily as needed (itchy eyes). In the fall   mineral oil-hydrophilic petrolatum (AQUAPHOR) ointment Apply 1 Application topically as needed for dry skin.   Multiple Vitamin (ONE-DAILY MULTI VITAMINS PO) Take 30 mLs by mouth daily. [name] liquid vitamin   OVER THE COUNTER MEDICATION Place 1 application  into both eyes at bedtime. Pure & Clean   topiramate (TOPAMAX) 25 MG tablet Take 1 tablet (25 mg total) by mouth daily.   triamcinolone cream (KENALOG) 0.1 % Apply 1 Application topically 2 (two) times daily as needed (itching).   Vitamins C E (CRANBERRY URINARY COMFORT PO) Take by mouth.   [DISCONTINUED] loratadine (CLARITIN) 10 MG tablet Take 10 mg by mouth daily as needed for allergies.   [DISCONTINUED] rosuvastatin (CRESTOR) 10 MG tablet Take 1 tablet (10 mg total) by mouth daily.   NON FORMULARY Take 1,000 mg by mouth daily. Plant calcium (Patient not taking: Reported on 10/05/2023)   No facility-administered encounter medications on file as of 10/19/2023.    Review of Systems:   Review of Systems  Constitutional:  Negative for appetite change, chills, fatigue and fever.  HENT:  Negative for congestion, hearing loss, rhinorrhea and sore throat.   Eyes: Negative.   Respiratory:  Negative for cough, shortness of breath and wheezing.   Cardiovascular:  Negative for chest pain, palpitations and leg swelling.  Gastrointestinal:  Negative for abdominal pain, constipation, diarrhea, nausea and vomiting.  Genitourinary:  Negative for dysuria.  Musculoskeletal:  Negative for arthralgias, back pain and myalgias.  Skin:  Negative for color change, rash and wound.  Neurological:  Negative for dizziness, weakness and headaches.  Psychiatric/Behavioral:  Negative for behavioral problems. The patient is not nervous/anxious.     Health Maintenance  Topic Date Due   COVID-19 Vaccine (8 - Pfizer risk 2024-25 season) 12/06/2023   Medicare Annual Wellness (AWV)  04/30/2024   DTaP/Tdap/Td (4 - Td or Tdap) 12/26/2027  Pneumonia Vaccine 50+ Years old  Completed   INFLUENZA VACCINE  Completed   DEXA SCAN  Completed   Zoster Vaccines- Shingrix  Completed   HPV VACCINES  Aged Out   Hepatitis C Screening  Discontinued    Physical Exam: Vitals:   10/19/23 1015 10/19/23 1044  BP: 116/87   Pulse: (!) 45 (!) 46  Resp: 18   Temp: (!) 96.6 F (35.9 C)   SpO2: 98%   Weight: 135 lb 6.4 oz (61.4 kg)   Height: 5\' 7"  (1.702 m)    Body mass index is 21.21 kg/m. Physical Exam Constitutional:      Appearance: Normal appearance.  HENT:     Head: Normocephalic and atraumatic.     Nose: Nose normal.     Mouth/Throat:     Mouth: Mucous membranes are moist.  Eyes:     Conjunctiva/sclera: Conjunctivae normal.  Cardiovascular:     Rate and Rhythm: Normal rate and regular rhythm.  Pulmonary:     Effort: Pulmonary effort is normal.     Breath sounds: Normal breath sounds.  Abdominal:     General: Bowel sounds are normal.     Palpations: Abdomen is soft.  Musculoskeletal:         General: Normal range of motion.     Cervical back: Normal range of motion.  Skin:    General: Skin is warm and dry.  Neurological:     General: No focal deficit present.     Mental Status: She is alert and oriented to person, place, and time.  Psychiatric:        Mood and Affect: Mood normal.        Behavior: Behavior normal.        Thought Content: Thought content normal.        Judgment: Judgment normal.     Labs reviewed: Basic Metabolic Panel: Recent Labs    12/18/22 0842 03/19/23 1004 08/11/23 0856 08/20/23 1645 08/27/23 1524  NA 142   < > 145 141 143  K 4.1   < > 4.2 3.9 4.0  CL 107   < > 108 105 108  CO2 29   < > 30 27 25   GLUCOSE 77   < > 74 107* 97  BUN 26*   < > 26* 18 19  CREATININE 0.75   < > 0.77 0.67 0.73  CALCIUM 9.1   < > 9.2 9.7 9.5  TSH 3.36  --  3.37  --   --    < > = values in this interval not displayed.   Liver Function Tests: Recent Labs    03/19/23 1004 08/11/23 0856 08/20/23 1645 08/27/23 1524 10/05/23 1612  AST 37   < > 33 40 29  ALT 28   < > 28 23 19   ALKPHOS 58  --  60 52  --   BILITOT 1.1   < > 1.0 1.2 0.6  PROT 6.4*   < > 6.3* 6.8 5.7*  ALBUMIN 4.0  --  4.5 4.4  --    < > = values in this interval not displayed.   No results for input(s): "LIPASE", "AMYLASE" in the last 8760 hours. No results for input(s): "AMMONIA" in the last 8760 hours. CBC: Recent Labs    08/11/23 0856 08/20/23 1645 08/27/23 1524  WBC 3.8 6.5 5.5  NEUTROABS 1,588 4.5 3.0  HGB 14.3 14.9 15.0  HCT 43.4 45.1 45.7  MCV 98.0 97.0 100.7*  PLT 150 152  146*   Lipid Panel: Recent Labs    12/18/22 0842 08/11/23 0856  CHOL 144 153  HDL 64 57  LDLCALC 66 82  TRIG 59 67  CHOLHDL 2.3 2.7   No results found for: "HGBA1C"  Procedures since last visit: Nocturnal polysomnography Result Date: 10/13/2023 Huston Foley, MD     10/14/2023 10:12 AM Physician Interpretation:  [facility] POLYSOMNOGRAPHY  INTERPRETATION  REPORT STUDY DATE:  10/13/2023  PATIENT NAME:  Shelia [name]        DATE OF BIRTH:  1941-05-10 PATIENT ID:  [id number]    TYPE OF STUDY:  PSG READING PHYSICIAN: Huston Foley, MD, PhD   SCORING TECHNICIAN: Margaretann Loveless, RPSGT  Referred by: Richarda Blade, NP ? History and Indication for Testing: 83 year old female with an underlying medical history of cerebral meningioma, atrial fibrillation, on Eliquis, status post cardioversion and cardiac ablation, allergies, osteopenia, hyperlipidemia, arthritis, and anxiety, who reports recurrent headaches. Headaches are intermittent, sometimes worse at night, sometimes she has trouble sleeping through the night.  Height: 67 in Weight: 137 lb (BMI 21) Neck Size: 0  MEDICATIONS: Tylenol, Eliquis, Lumify, Refresh Optive Mega-3, Keflex, Benadryl, Hydrocortisone cream, Zaditor, Claritin, Multiple Vitamin, Non Formulary, Compazine, Crestor, Kenalog, Vitamins C E  TECHNICAL DESCRIPTION: A registered sleep technologist was in attendance for the duration of the recording.  Data collection, scoring, video monitoring, and reporting were performed in compliance with the AASM Manual for the Scoring of Sleep and Associated Events; (Hypopnea is scored based on the criteria listed in Section VIII D. 1b in the AASM Manual V2.6 using a 4% oxygen desaturation rule or Hypopnea is scored based on the criteria listed in Section VIII D. 1a in the AASM Manual V2.6 using 3% oxygen desaturation and /or arousal rule). SLEEP CONTINUITY AND SLEEP ARCHITECTURE:  Lights-out was at 22:01: and lights-on at  05:06:, with a total recording time of 7 hours 5.5 min. Total sleep time ( TST) was 316.5 minutes with a decreased sleep efficiency at 74.4%. There was  7.9% REM sleep. BODY POSITION:  TST was divided  between the following sleep positions: 43.3% supine;  56.7% lateral;  0% prone. Duration of total sleep and percent of total sleep in their respective position is as follows: supine 137 minutes (43%),  non-supine 180 minutes (57%); right 61 minutes (19%), left 118 minutes (37%), and prone 00 minutes (0%). Total supine REM sleep time was 00 minutes (0% of total REM sleep). Sleep latency was normal at 7.0 minutes.  REM sleep latency was increased at 132.0 minutes. Of the total sleep time, the percentage of stage N1 sleep was 2.7%, stage N2 sleep was 56%, which is near-normal, stage N3 sleep was 33.8%, which is increased, and REM sleep was 7.9%, which is reduced. Wake after sleep onset (WASO) time accounted for 102 minutes with mild to moderate sleep fragmentation noted. RESPIRATORY MONITORING:  Based on CMS criteria (using a 4% oxygen desaturation rule for scoring hypopneas), there were 0 apneas (0 obstructive; 0 central; 0 mixed), and 3 hypopneas.  Apnea index was 0.0. Hypopnea index was 0.6. The apnea-hypopnea index was 0.6/hour overall (1.3 supine, 0 non-supine; 0.0 REM, 0.0 supine REM).  There were 0 respiratory effort-related arousals (RERAs).  The RERA index was 0 events/h. Total respiratory disturbance index (RDI) was 0.6 events/h. RDI results showed: supine RDI  1.3 /h; non-supine RDI 0.0 /h; REM RDI 0.0 /h, supine REM RDI 0.0 /h. Based on AASM criteria (using a 3% oxygen desaturation and /  or arousal rule for scoring hypopneas), there were 0 apneas (0 obstructive; 0 central; 0 mixed), and 10 hypopneas. Apnea index was 0.0. Hypopnea index was 1.9. The apnea-hypopnea index was 1.9 overall (4.4 supine, 0 non-supine; 0.0 REM, 0.0 supine REM).  There were 0 respiratory effort-related arousals (RERAs).  The RERA index was 0 events/h. Total respiratory disturbance index (RDI) was 1.9 events/h. RDI results showed: supine RDI  4.4 /h; non-supine RDI 0.0 /h; REM RDI 0.0 /h, supine REM RDI 0.0 /h.  OXIMETRY: Oxyhemoglobin Saturation Nadir during sleep was at  93%) from a mean of 97%.  Of the Total sleep time (TST)   hypoxemia (=<88%) was present for  0.2 minutes, or 0.0% of total sleep time.  LIMB MOVEMENTS: There were  0 periodic limb movements of sleep (0.0/hr), of which 0 (0.0/hr) were associated with an arousal.  AROUSAL: There were 32 arousals in total, for an arousal index of 6 arousals/hour.  Of these, 6 were identified as respiratory-related arousals (1 /h), 0 were PLM-related arousals (0 /h), and 39 were non-specific arousals (7 /h).  EEG: Review of the EEG showed no abnormal electrical discharges and symmetrical bihemispheric findings.  EKG: The EKG revealed bradycardia, with likely atrial fibrillation and occasional PVCs (premature ventricular contractions). The average heart rate during sleep was 47 bpm. AUDIO/VIDEO REVIEW: The audio and video review did not show any abnormal or unusual behaviors, movements, phonations or vocalizations. The patient took 1 restroom break. No audible s3noring was noted. POST-STUDY QUESTIONNAIRE: Post study, the patient indicated, that sleep was the same as usual. IMPRESSION: 1. Dysfunctions associated with sleep stages or arousal from sleep 2. Non-specific abnormal electrocardiogram (EKG)  RECOMMENDATIONS: 1. This study does not demonstrate any significant obstructive or central sleep disordered breathing with an AHI of less than 5/hour - her AHI was 0.6/hour - and oxygen saturations remained at or above 89% for the night. No significant snoring was noted. Treatment with a positive airway pressure device, such as CPAP or autoPAP is not indicated.  2. This study shows some sleep fragmentation and abnormal sleep stage percentages; these are nonspecific findings and per se do not signify an intrinsic sleep disorder or a cause for the patient's sleep-related symptoms. Causes include (but are not limited to) the first night effect of the sleep study, circadian rhythm disturbances, medication effect or an underlying mood disorder or medical problem. 3. The patient should be cautioned not to drive, work at heights, or operate dangerous or heavy equipment when tired or sleepy. Review and  reiteration of good sleep hygiene measures should be pursued with any patient. 4. The study showed bradycardia, a mildly irregular rhythm with likely atrial fibrillation (known history) and occasional PVCs on single lead EKG; clinical correlation is recommended. The patient is well-known to cardiology. 5. The patient will be advised to follow up with the referring provider, who will be notified of the test results. I certify that I have reviewed the entire raw data recording prior to the issuance of this report in accordance with the Standards of Accreditation of the American Academy of Sleep Medicine (AASM). Huston Foley, MD, PhD Medical Director, [facility]) Diplomat, ABPN (Neurology and Sleep)    Technical Report: General Information Name: Etoile, Looman BMI: 69.62 Physician: Huston Foley, MD ID: [id number] Height: 67.0 in Technician: Margaretann Loveless, RPSGT Sex: Female Weight: 137.0 lb Record: [id number] Age: 83 [May 16, 1941] Date: 10/13/2023   Medical & Medication History   83 year old female with an underlying  medical history of cerebral meningioma, atrial fibrillation, on Eliquis, status post cardioversion and cardiac ablation, allergies, osteopenia, hyperlipidemia, arthritis, and anxiety, who reports recurrent headaches for the past approximately 2 weeks. She has had some nasal drainage from the right side intermittently. Headaches are intermittent, sometimes worse at night, sometimes she has trouble sleeping through the night. She does not have a prior history of headaches or migraines. She denies any sudden onset of one-sided weakness or numbness or tingling or droopy face or slurring of speech, no recent falls. She did get a prescription for Compazine and Benadryl through the ER and has taken these medicines at night without any significant improvement. She has used Tylenol over-the-counter. Tylenol, Eliquis, Lumify, Refresh Optive Mega-3, Keflex, Benadryl,  Hydrocortisone cream, Zaditor, Claritin, Multiple Vitamin, Non Formulary, Compazine, Crestor, Kenalog, Vitamins C E  Sleep Disorder    Comments  The patient came into the lab for a PSG. All sleep stages witnessed. Respiratory events scored with a 4% desat. Slept lateral and supine. AHI was 0.5 after 2 hrs of TST. No snoring. One bathroom break. EKG abnormal. Patient has known cardiac history.   Lights out: 10:01:30 PM Lights on: 05:06:36 AM Time Total Supine Side Prone Upright Recording (TRT) 7h 5.41m 3h 24.56m 3h 41.78m 0h 0.28m 0h 0.67m Sleep (TST) 5h 16.34m 2h 17.54m 2h 59.62m 0h 0.35m 0h 0.32m Latency N1 N2 N3 REM Onset Per. Slp. Eff. Actual 0h 0.22m 0h 1.57m 0h 29.60m 2h 12.57m 0h 7.13m 0h 29.85m 74.38% Stg Dur Wake N1 N2 N3 REM Total 109.0 8.5 176.0 107.0 25.0 Supine 67.5 5.0 92.5 39.5 0.0 Side 41.5 3.5 83.5 67.5 25.0 Prone 0.0 0.0 0.0 0.0 0.0 Upright 0.0 0.0 0.0 0.0 0.0  Stg % Wake N1 N2 N3 REM Total 25.6 2.7 55.6 33.8 7.9 Supine 15.9 1.6 29.2 12.5 0.0 Side 9.8 1.1 26.4 21.3 7.9 Prone 0.0 0.0 0.0 0.0 0.0 Upright 0.0 0.0 0.0 0.0 0.0  Apnea Summary Sub Supine Side Prone Upright Total 0 Total 0 0 0 0 0   REM 0 0 0 0 0   NREM 0 0 0 0 0 Obs 0 REM 0 0 0 0 0   NREM 0 0 0 0 0 Mix 0 REM 0 0 0 0 0   NREM 0 0 0 0 0 Cen 0 REM 0 0 0 0 0   NREM 0 0 0 0 0 Rera Summary Sub Supine Side Prone Upright Total 0 Total 0 0 0 0 0   REM 0 0 0 0 0   NREM 0 0 0 0 0  Hypopnea Summary Sub Supine Side Prone Upright Total 10 Total 10 10 0 0 0   REM 0 0 0 0 0   NREM 10 10 0 0 0 4% Hypopnea Summary Sub Supine Side Prone Upright Total (4%) 3 Total 3 3 0 0 0   REM 0 0 0 0 0   NREM 3 3 0 0 0  AHI Total Obs Mix Cen 1.90 Apnea 0.00 0.00 0.00 0.00  Hypopnea 1.90 -- -- -- 0.57 Hypopnea (4%) 0.57 -- -- --  Total Supine Side Prone Upright Position AHI 1.90 4.38 0.00 0.00 0.00 REM AHI 0.00  NREM AHI 2.06  Position RDI 1.90 4.38 0.00 0.00 0.00 REM RDI 0.00  NREM RDI 2.06  4% Hypopnea Total Supine Side Prone Upright Position AHI (4%) 0.57 1.31 0.00 0.00 0.00 REM AHI  (4%) 0.00  NREM AHI (4%) 0.62  Position RDI (4%) 0.57 1.31 0.00  0.00 0.00 REM RDI (4%) 0.00  NREM RDI (4%) 0.62  Desaturation Information Threshold: 2% <100% <90% <80% <70% <60% <50% <40% Supine 39.0 2.0 0.0 0.0 0.0 0.0 0.0 Side 17.0 0.0 0.0 0.0 0.0 0.0 0.0 Prone 0.0 0.0 0.0 0.0 0.0 0.0 0.0 Upright 0.0 0.0 0.0 0.0 0.0 0.0 0.0 Total 56.0 2.0 0.0 0.0 0.0 0.0 0.0 Index 8.0 0.3 0.0 0.0 0.0 0.0 0.0 Threshold: 3% <100% <90% <80% <70% <60% <50% <40% Supine 17.0 2.0 0.0 0.0 0.0 0.0 0.0 Side 3.0 0.0 0.0 0.0 0.0 0.0 0.0 Prone 0.0 0.0 0.0 0.0 0.0 0.0 0.0 Upright 0.0 0.0 0.0 0.0 0.0 0.0 0.0 Total 20.0 2.0 0.0 0.0 0.0 0.0 0.0 Index 2.9 0.3 0.0 0.0 0.0 0.0 0.0 Threshold: 4% <100% <90% <80% <70% <60% <50% <40% Supine 6.0 2.0 0.0 0.0 0.0 0.0 0.0 Side 0.0 0.0 0.0 0.0 0.0 0.0 0.0 Prone 0.0 0.0 0.0 0.0 0.0 0.0 0.0 Upright 0.0 0.0 0.0 0.0 0.0 0.0 0.0 Total 6.0 2.0 0.0 0.0 0.0 0.0 0.0 Index 0.9 0.3 0.0 0.0 0.0 0.0 0.0 Threshold: 3% <100% <90% <80% <70% <60% <50% <40% Supine 17 2 0 0 0 0 0 Side 3 0 0 0 0 0 0 Prone 0 0 0 0 0 0 0 Upright 0 0 0 0 0 0 0 Total 20 2 0 0 0 0 0  Awakening/Arousal Information # of Awakenings 26 Wake after sleep onset 102.22m Wake after persistent sleep 93.50m Arousal Assoc. Arousals Index Apneas 0 0.0 Hypopneas 6 1.1 Leg Movements 0 0.0 Snore 0 0.0 PTT Arousals 0 0.0 Spontaneous 39 7.4 Total 45 8.5 Leg Movement Information PLMS LMs Index Total LMs during PLMS 0 0.0 LMs w/ Microarousals 0 0.0 LM LMs Index w/ Microarousal 0 0.0 w/ Awakening 0 0.0 w/ Resp Event 0 0.0 Spontaneous 0 0.0 Total 0 0.0  Desaturation threshold setting: 3% Minimum desaturation setting: 10 seconds SaO2 nadir: 81% The longest event was a 44 sec obstructive Hypopnea with a minimum SaO2 of 89%. The lowest SaO2 was 89% associated with a 44 sec obstructive Hypopnea. EKG Rates EKG Avg Max Min Awake 49 92 41 Asleep 47 87 39 EKG Events: N/A    Assessment/Plan  1. Nonintractable episodic headache, unspecified headache type (Primary) -  Topamax 25  mg daily effective, no headaches for a week. Increasing dose unnecessary due to current efficacy and side effect minimization. - Continue Topamax 25 mg daily. - Monitor for headache recurrence. - Consider increasing Topamax to 50 mg if headaches recur.  2. PAF (paroxysmal atrial fibrillation) (HCC) -  Bradycardia noted with pulse 45-46 bpm. Diltiazem avoided due to low heart rate. - Continue Eliquis as prescribed. - Refer to cardiologist Dr. Jens Som for bradycardia and AFib management. - Encourage increased physical activity. - Ambulatory referral to Cardiology  3. Seasonal allergies -  Loratadine ineffective, cetirizine previously effective but may cause drowsiness. - Discontinue loratadine. - Start cetirizine as needed. - Consider Flonase if symptoms persist. - cetirizine (ZYRTEC) 10 MG tablet; Take 1 tablet (10 mg total) by mouth daily as needed for allergies.  4. Constipation -  Daily bowel movements unsatisfactory. Metamucil less effective, low food intake may contribute. - Continue Metamucil daily. - Increase food intake with small portions.   General Health Maintenance Encouraged to maintain physical activity and monitor heart rate. - Consider purchasing a Fitbit to monitor heart rate and activity levels. - Encourage regular physical activity, including walking and using weights at home.  Follow-up Follow-up appointment scheduled for  February 08, 2024. - Follow up with cardiologist Dr. Jens Som for bradycardia and AFib management. - Keep the follow-up appointment on February 08, 2024. - Contact if headaches recur or other issues arise before the next appointment.      Labs/tests ordered:  None   No follow-ups on file.  Kenard Gower, NP

## 2023-10-21 ENCOUNTER — Other Ambulatory Visit (HOSPITAL_COMMUNITY): Payer: Self-pay

## 2023-10-21 MED ORDER — ROSUVASTATIN CALCIUM 10 MG PO TABS
10.0000 mg | ORAL_TABLET | Freq: Every day | ORAL | 0 refills | Status: DC
Start: 2023-10-21 — End: 2024-01-26
  Filled 2023-10-21 – 2023-11-03 (×2): qty 90, 90d supply, fill #0

## 2023-10-29 ENCOUNTER — Other Ambulatory Visit (HOSPITAL_COMMUNITY): Payer: Self-pay

## 2023-11-02 ENCOUNTER — Other Ambulatory Visit (HOSPITAL_COMMUNITY): Payer: Self-pay

## 2023-11-03 ENCOUNTER — Other Ambulatory Visit: Payer: Self-pay

## 2023-11-03 ENCOUNTER — Other Ambulatory Visit (HOSPITAL_COMMUNITY): Payer: Self-pay

## 2023-11-05 ENCOUNTER — Institutional Professional Consult (permissible substitution) (INDEPENDENT_AMBULATORY_CARE_PROVIDER_SITE_OTHER): Payer: PPO

## 2023-11-06 ENCOUNTER — Telehealth: Payer: Self-pay

## 2023-11-06 DIAGNOSIS — D2371 Other benign neoplasm of skin of right lower limb, including hip: Secondary | ICD-10-CM | POA: Diagnosis not present

## 2023-11-06 DIAGNOSIS — Q828 Other specified congenital malformations of skin: Secondary | ICD-10-CM | POA: Diagnosis not present

## 2023-11-06 DIAGNOSIS — L602 Onychogryphosis: Secondary | ICD-10-CM | POA: Diagnosis not present

## 2023-11-06 DIAGNOSIS — M21611 Bunion of right foot: Secondary | ICD-10-CM | POA: Diagnosis not present

## 2023-11-06 DIAGNOSIS — I70203 Unspecified atherosclerosis of native arteries of extremities, bilateral legs: Secondary | ICD-10-CM | POA: Diagnosis not present

## 2023-11-06 DIAGNOSIS — M2041 Other hammer toe(s) (acquired), right foot: Secondary | ICD-10-CM | POA: Diagnosis not present

## 2023-11-06 DIAGNOSIS — M21612 Bunion of left foot: Secondary | ICD-10-CM | POA: Diagnosis not present

## 2023-11-06 NOTE — Telephone Encounter (Signed)
Message routed to PCP Medina-Vargas, Monina C, NP  

## 2023-11-06 NOTE — Telephone Encounter (Signed)
 Copied from CRM [id number]. Topic: Clinical - Medication Question >> Nov 06, 2023  4:49 PM Adrianna P wrote: Reason for CRM: Patient would like topiramate (TOPAMAX) 25 MG tablet increased to 50mg  because she is still getting headaches

## 2023-11-08 ENCOUNTER — Other Ambulatory Visit: Payer: Self-pay | Admitting: Adult Health

## 2023-11-08 DIAGNOSIS — R519 Headache, unspecified: Secondary | ICD-10-CM

## 2023-11-08 MED ORDER — TOPIRAMATE 50 MG PO TABS
50.0000 mg | ORAL_TABLET | Freq: Every day | ORAL | 3 refills | Status: DC
  Filled 2023-11-08: qty 30, 30d supply, fill #0
  Filled 2023-12-04: qty 30, 30d supply, fill #1
  Filled 2024-01-04: qty 30, 30d supply, fill #2

## 2023-11-08 NOTE — Telephone Encounter (Signed)
 Topamax increased dosage from 25 mg daily to 50 mg daily for headacahe. Sent eRx to pharmacy.

## 2023-11-09 ENCOUNTER — Other Ambulatory Visit (HOSPITAL_COMMUNITY): Payer: Self-pay

## 2023-11-09 NOTE — Telephone Encounter (Signed)
 Patient called and no answer. Voicemail left with office callback number. MyChart message sent to patient as another form of communication.

## 2023-11-14 ENCOUNTER — Other Ambulatory Visit (HOSPITAL_COMMUNITY): Payer: Self-pay

## 2023-11-27 ENCOUNTER — Other Ambulatory Visit (HOSPITAL_COMMUNITY): Payer: Self-pay

## 2023-11-28 ENCOUNTER — Other Ambulatory Visit (HOSPITAL_COMMUNITY): Payer: Self-pay

## 2023-11-28 DIAGNOSIS — R300 Dysuria: Secondary | ICD-10-CM | POA: Diagnosis not present

## 2023-11-28 DIAGNOSIS — N390 Urinary tract infection, site not specified: Secondary | ICD-10-CM | POA: Diagnosis not present

## 2023-11-28 MED ORDER — SULFAMETHOXAZOLE-TRIMETHOPRIM 800-160 MG PO TABS
ORAL_TABLET | ORAL | 0 refills | Status: DC
  Filled 2023-11-28: qty 10, 5d supply, fill #0

## 2023-12-01 ENCOUNTER — Telehealth: Payer: Self-pay

## 2023-12-01 ENCOUNTER — Other Ambulatory Visit (HOSPITAL_COMMUNITY): Payer: Self-pay

## 2023-12-01 MED ORDER — NITROFURANTOIN MONOHYD MACRO 100 MG PO CAPS
ORAL_CAPSULE | ORAL | 0 refills | Status: DC
Start: 2023-11-30 — End: 2023-12-24
  Filled 2023-12-01: qty 10, 5d supply, fill #0

## 2023-12-01 NOTE — Telephone Encounter (Signed)
 Per care everywhere note, Bactrim DS was discontinued and was switched to Macrobid .  Please discontinue Bactrim DS and take Macrobid .

## 2023-12-01 NOTE — Telephone Encounter (Signed)
 Copied from CRM [id number]. Topic: Clinical - Medication Question >> Dec 01, 2023  4:04 PM Brynn Caras wrote: Reason for CRM: The patient visited UC on 11/28/2023 at [facility] for a UTI and was prescribed sulfamethoxazole-trimethoprim (BACTRIM DS) 800-160 mg per tablet. She was given a 5 day supply of this prescription. She would like to confirm if she should be consuming the Bactrim DS tablets and Macrobid  100 MG capsule for her UTI or if she should stop taking the Bactrim DS and only take the Macrobid ? Please advise the best directions on this, patient is available at anytime. Callback [phone]

## 2023-12-01 NOTE — Telephone Encounter (Signed)
Message routed to PCP Medina-Vargas, Monina C, NP  

## 2023-12-02 NOTE — Telephone Encounter (Signed)
Mychart message sent to patient with providers response.

## 2023-12-07 ENCOUNTER — Encounter: Payer: Self-pay | Admitting: Adult Health

## 2023-12-07 ENCOUNTER — Telehealth: Payer: Self-pay | Admitting: *Deleted

## 2023-12-07 NOTE — Telephone Encounter (Signed)
 Copied from CRM [id number]. Topic: Clinical - Medication Question >> Shelia [name] 12, 2025 11:55 AM Shelia [name] B wrote: Reason for CRM: patient is stating she is having frequent UTI patient states would like for the provider to consider prescribing Estrogen cream. Please call patient at [phone]     Please Advise.

## 2023-12-10 ENCOUNTER — Other Ambulatory Visit: Payer: Self-pay | Admitting: Adult Health

## 2023-12-10 ENCOUNTER — Other Ambulatory Visit (HOSPITAL_COMMUNITY): Payer: Self-pay

## 2023-12-10 ENCOUNTER — Other Ambulatory Visit: Payer: Self-pay

## 2023-12-10 DIAGNOSIS — N390 Urinary tract infection, site not specified: Secondary | ICD-10-CM

## 2023-12-10 MED ORDER — ESTRADIOL 0.25 MG/0.25GM TD GEL
1.0000 | TRANSDERMAL | 3 refills | Status: DC
Start: 2023-12-10 — End: 2024-01-20
  Filled 2023-12-10: qty 30, 100d supply, fill #0

## 2023-12-10 NOTE — Telephone Encounter (Signed)
 Sent e-Rx to pharmacy

## 2023-12-10 NOTE — Telephone Encounter (Signed)
 Estrogen cream eRx sent.

## 2023-12-10 NOTE — Telephone Encounter (Signed)
 Noted. Patient was notified  Message sent to Medina-Vargas, Sid Dragon, NP

## 2023-12-23 DIAGNOSIS — M21611 Bunion of right foot: Secondary | ICD-10-CM | POA: Diagnosis not present

## 2023-12-23 DIAGNOSIS — L602 Onychogryphosis: Secondary | ICD-10-CM | POA: Diagnosis not present

## 2023-12-23 DIAGNOSIS — M2041 Other hammer toe(s) (acquired), right foot: Secondary | ICD-10-CM | POA: Diagnosis not present

## 2023-12-23 DIAGNOSIS — Q828 Other specified congenital malformations of skin: Secondary | ICD-10-CM | POA: Diagnosis not present

## 2023-12-23 DIAGNOSIS — M21962 Unspecified acquired deformity of left lower leg: Secondary | ICD-10-CM | POA: Diagnosis not present

## 2023-12-23 DIAGNOSIS — I70203 Unspecified atherosclerosis of native arteries of extremities, bilateral legs: Secondary | ICD-10-CM | POA: Diagnosis not present

## 2023-12-23 DIAGNOSIS — M21961 Unspecified acquired deformity of right lower leg: Secondary | ICD-10-CM | POA: Diagnosis not present

## 2023-12-23 DIAGNOSIS — M21612 Bunion of left foot: Secondary | ICD-10-CM | POA: Diagnosis not present

## 2023-12-23 DIAGNOSIS — D2371 Other benign neoplasm of skin of right lower limb, including hip: Secondary | ICD-10-CM | POA: Diagnosis not present

## 2023-12-24 ENCOUNTER — Encounter: Payer: Self-pay | Admitting: Adult Health

## 2023-12-24 ENCOUNTER — Ambulatory Visit: Admitting: Adult Health

## 2023-12-24 VITALS — BP 118/80 | HR 50 | Temp 97.6°F | Ht 67.0 in | Wt 136.2 lb

## 2023-12-24 DIAGNOSIS — R519 Headache, unspecified: Secondary | ICD-10-CM

## 2023-12-24 DIAGNOSIS — I480 Paroxysmal atrial fibrillation: Secondary | ICD-10-CM

## 2023-12-24 DIAGNOSIS — E785 Hyperlipidemia, unspecified: Secondary | ICD-10-CM | POA: Diagnosis not present

## 2023-12-24 DIAGNOSIS — N390 Urinary tract infection, site not specified: Secondary | ICD-10-CM

## 2023-12-24 DIAGNOSIS — R3915 Urgency of urination: Secondary | ICD-10-CM

## 2023-12-24 LAB — POCT URINALYSIS DIPSTICK (MANUAL)
Nitrite, UA: NEGATIVE
Poct Bilirubin: NEGATIVE
Poct Blood: NEGATIVE
Poct Glucose: NORMAL mg/dL
Poct Ketones: NEGATIVE
Poct Protein: NEGATIVE mg/dL
Poct Urobilinogen: 1 mg/dL — AB
Spec Grav, UA: 1.01 (ref 1.010–1.025)
pH, UA: 6 (ref 5.0–8.0)

## 2023-12-24 NOTE — Patient Instructions (Signed)
 Do not eat starting midnight before February 08, 2024 appointment. Do not eat breakfast.

## 2023-12-24 NOTE — Progress Notes (Signed)
 [facility] clinic  Provider:  Inge Mangle DNP  Code Status:  Full Code  Goals of Care:     10/05/2023    3:40 PM  Advanced Directives  Does Patient Have a Medical Advance Directive? Yes  Type of Estate agent of [place];Living will;Out of facility DNR (pink MOST or yellow form)  Does patient want to make changes to medical advance directive? No - Patient declined  Copy of Healthcare Power of Attorney in Chart? Yes - validated most recent copy scanned in chart (See row information)  Would patient like information on creating a medical advance directive? No - Patient declined     Chief Complaint  Patient presents with   Urinary Tract Infection   Discussed the use of AI scribe software for clinical note transcription with the patient, who gave verbal consent to proceed.   HPI: Patient is a 83 y.o. female seen today for an acute visit for possible UTI.  She experiences a persistent urge to urinate, which has been ongoing since her last visit. The urgency is not constant and tends to decrease with increased water intake, although this results in more frequent urination. No dysuria. She previously visited urgent care for a suspected urinary tract infection and was treated with nitrofurantoin , which she completed on Nov 30, 2023. She also uses estradiol  cream, initially applying it every night for two weeks, and is now transitioning to twice a week as per the instructions on the box.  She is currently taking Topamax  50 mg for headaches, which she started on November 08, 2023. No current headaches.  For atrial fibrillation, she takes Eliquis  5 mg twice daily. She does not take diltiazem  and does not regularly monitor her heart rate at home. Her heart rate was noted to be low at 49 bpm.  For hyperlipidemia, she takes rosuvastatin  10 mg daily. Her last cholesterol levels were within normal limits with a total cholesterol of 153 mg/dL, HDL of 57 mg/dL, triglycerides of  67 mg/dL, and LDL of 82 mg/dL.     Past Medical History:  Diagnosis Date   Anxiety    Arthritis    "left knee; left hip; lower back; mild to moderate in right hip" (09/30/2016)   Atrial fibrillation (HCC)    Chronic lower back pain    Gastritis    Heart palpitations    History of bone density study 2018   History of mammogram 2021   History of MRI 2018   History of Papanicolaou smear of cervix    Hypercholesteremia    Osteopenia    Seasonal allergies     Past Surgical History:  Procedure Laterality Date   ATRIAL FIBRILLATION ABLATION N/A 06/10/2022   Procedure: ATRIAL FIBRILLATION ABLATION;  Surgeon: Lei Pump, MD;  Location: [facility] INVASIVE CV LAB;  Service: Cardiovascular;  Laterality: N/A;   CARDIOVERSION N/A 11/17/2019   Procedure: CARDIOVERSION;  Surgeon: Lenise Quince, MD;  Location: [facility] ENDOSCOPY;  Service: Cardiovascular;  Laterality: N/A;   CARDIOVERSION N/A 04/16/2021   Procedure: CARDIOVERSION;  Surgeon: Maudine Sos, MD;  Location: [facility] ENDOSCOPY;  Service: Cardiovascular;  Laterality: N/A;   CARDIOVERSION N/A 08/15/2021   Procedure: CARDIOVERSION;  Surgeon: Bridgette Campus, MD;  Location: [facility] ENDOSCOPY;  Service: Cardiovascular;  Laterality: N/A;   CARDIOVERSION N/A 05/25/2023   Procedure: CARDIOVERSION;  Surgeon: Hazle Lites, MD;  Location: [facility] INVASIVE CV LAB;  Service: Cardiovascular;  Laterality: N/A;   COLONOSCOPY     DILATION AND CURETTAGE OF UTERUS  JOINT REPLACEMENT     TONSILLECTOMY     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   Procedure: TOTAL HIP ARTHROPLASTY ANTERIOR APPROACH;  Surgeon: Neil Balls, MD;  Location: [facility] OR;  Service: Orthopedics;  Laterality: Left;    No Known Allergies  Outpatient Encounter Medications as of 12/24/2023  Medication Sig   acetaminophen  (TYLENOL ) 500 MG tablet Take 2 tablets (1,000 mg total) by mouth every 8 (eight) hours as needed.   amoxicillin  (AMOXIL ) 500 MG capsule Take 4 capsules (2,000 mg total) by  mouth 1 hour prior to appointment.   amoxicillin -clavulanate (AUGMENTIN ) 875-125 MG tablet Take 1 tablet by mouth 2 (two) times daily for 5 days.   apixaban  (ELIQUIS ) 5 MG TABS tablet Take 1 tablet (5 mg total) by mouth 2 (two) times daily.   Brimonidine Tartrate (LUMIFY) 0.025 % SOLN Place 1 drop into both eyes daily. Red eyes   Carboxymeth-Glyc-Polysorb PF (REFRESH OPTIVE MEGA-3) 0.5-1-0.5 % SOLN Place 1 drop into both eyes 4 (four) times daily as needed (dry eyes). Enhance with flax seed oil   cetirizine  (ZYRTEC ) 10 MG tablet Take 1 tablet (10 mg total) by mouth daily as needed for allergies.   Estradiol  0.25 MG/0.25GM GEL Place 1 Application onto the skin 2 (two) times a week. To vaginal area   hydrocortisone  cream 0.5 % Apply 1 Application topically 2 (two) times daily as needed for itching.   ketotifen (ZADITOR) 0.025 % ophthalmic solution Place 1 drop into both eyes 2 (two) times daily as needed (itchy eyes). In the fall   Multiple Vitamin (ONE-DAILY MULTI VITAMINS PO) Take 30 mLs by mouth daily. Johnita Nails liquid vitamin   NON FORMULARY Take 1,000 mg by mouth daily. Plant calcium    OVER THE COUNTER MEDICATION Place 1 application  into both eyes at bedtime. Pure & Clean   rosuvastatin  (CRESTOR ) 10 MG tablet Take 1 tablet (10 mg total) by mouth daily. *pt needs appt*   topiramate  (TOPAMAX ) 50 MG tablet Take 1 tablet (50 mg total) by mouth daily.   triamcinolone cream (KENALOG) 0.1 % Apply 1 Application topically 2 (two) times daily as needed (itching).   Vitamins C E (CRANBERRY URINARY COMFORT PO) Take by mouth.   [DISCONTINUED] nitrofurantoin , macrocrystal-monohydrate, (MACROBID ) 100 MG capsule Take 1 capsule (100 mg total) by mouth 2 (two) times a day for 5 days.   diltiazem  (CARDIZEM ) 30 MG tablet Take 1 tablet every 4 hours AS NEEDED for AFIB heart rate >100 as long as top BP >100. (Patient not taking: Reported on 12/24/2023)   mineral oil-hydrophilic petrolatum (AQUAPHOR) ointment Apply 1  Application topically as needed for dry skin.   [DISCONTINUED] sulfamethoxazole -trimethoprim  (BACTRIM  DS) 800-160 MG tablet Take 1 tablet by mouth 2 (two) times a day for 5 days.   No facility-administered encounter medications on file as of 12/24/2023.    Review of Systems:  Review of Systems  Constitutional:  Negative for appetite change, chills, fatigue and fever.  HENT:  Negative for congestion, hearing loss, rhinorrhea and sore throat.   Eyes: Negative.   Respiratory:  Negative for cough, shortness of breath and wheezing.   Cardiovascular:  Negative for chest pain, palpitations and leg swelling.  Gastrointestinal:  Negative for abdominal pain, constipation, diarrhea, nausea and vomiting.  Genitourinary:  Positive for urgency. Negative for dysuria.  Musculoskeletal:  Negative for arthralgias, back pain and myalgias.  Skin:  Negative for color change, rash and wound.  Neurological:  Negative for dizziness, weakness and headaches.  Psychiatric/Behavioral:  Negative  for behavioral problems. The patient is not nervous/anxious.     Health Maintenance  Topic Date Due   COVID-19 Vaccine (8 - Pfizer risk 2024-25 season) 12/06/2023   INFLUENZA VACCINE  02/26/2024   Medicare Annual Wellness (AWV)  04/30/2024   DTaP/Tdap/Td (4 - Td or Tdap) 12/26/2027   Pneumonia Vaccine 15+ Years old  Completed   DEXA SCAN  Completed   Zoster Vaccines- Shingrix  Completed   HPV VACCINES  Aged Out   Meningococcal B Vaccine  Aged Out   Hepatitis C Screening  Discontinued    Physical Exam: Vitals:   12/24/23 0855  BP: 118/80  Pulse: (!) 50  Temp: 97.6 F (36.4 C)  SpO2: 98%  Weight: 136 lb 3.2 oz (61.8 kg)  Height: 5\' 7"  (1.702 m)   Body mass index is 21.33 kg/m. Physical Exam Constitutional:      Appearance: Normal appearance.  HENT:     Head: Normocephalic and atraumatic.     Nose: Nose normal.     Mouth/Throat:     Mouth: Mucous membranes are moist.  Eyes:     Conjunctiva/sclera:  Conjunctivae normal.  Cardiovascular:     Rate and Rhythm: Normal rate and regular rhythm.  Pulmonary:     Effort: Pulmonary effort is normal.     Breath sounds: Normal breath sounds.  Abdominal:     General: Bowel sounds are normal.     Palpations: Abdomen is soft.  Musculoskeletal:        General: Normal range of motion.     Cervical back: Normal range of motion.  Skin:    General: Skin is warm and dry.  Neurological:     General: No focal deficit present.     Mental Status: She is alert and oriented to person, place, and time.  Psychiatric:        Mood and Affect: Mood normal.        Behavior: Behavior normal.        Thought Content: Thought content normal.        Judgment: Judgment normal.     Labs reviewed: Basic Metabolic Panel: Recent Labs    08/11/23 0856 08/20/23 1645 08/27/23 1524  NA 145 141 143  K 4.2 3.9 4.0  CL 108 105 108  CO2 30 27 25   GLUCOSE 74 107* 97  BUN 26* 18 19  CREATININE 0.77 0.67 0.73  CALCIUM  9.2 9.7 9.5  TSH 3.37  --   --    Liver Function Tests: Recent Labs    03/19/23 1004 08/11/23 0856 08/20/23 1645 08/27/23 1524 10/05/23 1612  AST 37   < > 33 40 29  ALT 28   < > 28 23 19   ALKPHOS 58  --  60 52  --   BILITOT 1.1   < > 1.0 1.2 0.6  PROT 6.4*   < > 6.3* 6.8 5.7*  ALBUMIN 4.0  --  4.5 4.4  --    < > = values in this interval not displayed.   No results for input(s): "LIPASE", "AMYLASE" in the last 8760 hours. No results for input(s): "AMMONIA" in the last 8760 hours. CBC: Recent Labs    08/11/23 0856 08/20/23 1645 08/27/23 1524  WBC 3.8 6.5 5.5  NEUTROABS 1,588 4.5 3.0  HGB 14.3 14.9 15.0  HCT 43.4 45.1 45.7  MCV 98.0 97.0 100.7*  PLT 150 152 146*   Lipid Panel: Recent Labs    08/11/23 0856  CHOL 153  HDL  57  LDLCALC 82  TRIG 67  CHOLHDL 2.7   No results found for: "HGBA1C"  Procedures since last visit: No results found.  Assessment/Plan  1. Urinary urgency (Primary) -  Ongoing urgency without  dysuria. Negative nitrite, moderate leukocytes in urinalysis suggest possible infection. Awaiting culture results. - POCT Urinalysis Dip Manual - Urine Culture  2. Nonintractable episodic headache, unspecified headache type -  On Topamax  50 mg for migraine prophylaxis. Plan to reassess in October for tapering. - Continue Topamax  50 mg. - Reassess in October for potential tapering and discontinuation.  3. Paroxysmal A-fib (HCC) -  Managed with Eliquis . Diltiazem  avoided due to bradycardia. Annual cardiology follow-up maintained. - Continue Eliquis  5 mg twice daily. - Maintain annual follow-up with cardiologist.  4. Hyperlipidemia LDL goal <100 - Cholesterol controlled with rosuvastatin . Recent lipid panel within target range. - Continue rosuvastatin  10 mg daily.   12/27/2023 Addendum  5. Urinary tract infection without hematuria, site unspecified - Urine culture showed 50,000-100,000 CFU/mL of Klebsiella pneumoniae, sensitive to Augmentin , sent eRx to pharmacy - amoxicillin -clavulanate (AUGMENTIN ) 875-125 MG tablet; Take 1 tablet by mouth 2 (two) times daily for 5 days.  Dispense: 10 tablet; Refill: 0      Labs/tests ordered:  POC urine dipstick and urine culture   Return if symptoms worsen or fail to improve.  Rolondo Pierre Medina-Vargas, NP

## 2023-12-27 ENCOUNTER — Ambulatory Visit: Payer: Self-pay | Admitting: Adult Health

## 2023-12-27 LAB — URINE CULTURE
MICRO NUMBER:: 16515476
SPECIMEN QUALITY:: ADEQUATE

## 2023-12-27 MED ORDER — AMOXICILLIN-POT CLAVULANATE 875-125 MG PO TABS
1.0000 | ORAL_TABLET | Freq: Two times a day (BID) | ORAL | 0 refills | Status: AC
  Filled 2023-12-27: qty 10, 5d supply, fill #0

## 2023-12-27 NOTE — Progress Notes (Signed)
-   urine culture positive for UTI, sent eRx for Augmentin  to pharmacy.

## 2023-12-28 ENCOUNTER — Other Ambulatory Visit (HOSPITAL_COMMUNITY): Payer: Self-pay

## 2024-01-11 ENCOUNTER — Ambulatory Visit: Payer: Self-pay

## 2024-01-11 ENCOUNTER — Telehealth: Payer: Self-pay

## 2024-01-11 ENCOUNTER — Other Ambulatory Visit: Payer: Self-pay | Admitting: Adult Health

## 2024-01-11 ENCOUNTER — Other Ambulatory Visit (HOSPITAL_COMMUNITY): Payer: Self-pay

## 2024-01-11 MED ORDER — TOPIRAMATE 25 MG PO TABS
25.0000 mg | ORAL_TABLET | Freq: Every day | ORAL | 3 refills | Status: DC
  Filled 2024-01-11: qty 30, 30d supply, fill #0
  Filled 2024-02-04: qty 30, 30d supply, fill #1

## 2024-01-11 NOTE — Progress Notes (Signed)
 Patient requested for topiramate  to be tapered, decreased dosage from Topiramate  50 mg daily to 25 mg daily.

## 2024-01-11 NOTE — Telephone Encounter (Signed)
 Copied from CRM [id number]. Topic: Clinical - Medical Advice >> Jan 11, 2024  4:32 PM Adrianna P wrote: Reason for CRM: Patient would like to be tapered off topiramate  (TOPAMAX ) 50 MG tablet, before she has to pick up her next refill     Nurse attempted to call patient back: no answer: left voicemail

## 2024-01-11 NOTE — Telephone Encounter (Signed)
 FYI Only or Action Required?: Action required by provider  Patient was last seen in primary care on 12/24/2023 by Medina-Vargas, Monina C, NP. Called Nurse Triage reporting Memory Loss. Symptoms began ongoing. Interventions attempted: Nothing. Symptoms are: unchanged.  Triage Disposition: Call PCP When Office is Open  Patient/caregiver understands and will follow disposition?: No, wishes to speak with PCP     Reason for Disposition  [1] Caller has NON-URGENT medicine question about med that PCP prescribed AND [2] triager unable to answer question  Answer Assessment - Initial Assessment Questions 1. NAME of MEDICINE: What medicine(s) are you calling about?     Topiramax 2. QUESTION: What is your question? (e.g., double dose of medicine, side effect)     Would like to taper to lower dose due to side effect of memory issues  3. SYMPTOMS: Do you have any symptoms? If Yes, ask: What symptoms are you having?  How bad are the symptoms (e.g., mild, moderate, severe)     Forgetfulness  Additional info: States at next follow up she is to have memory testing. Would like pcp to follow up with her re: tapering topamax  to lower dose.  Protocols used: Medication Question Call-A-AH

## 2024-01-11 NOTE — Telephone Encounter (Signed)
 Tapered Topiramate  from 50 mg daily to Topiramate  25 mg daily, sent eRX to pharmacy. FYI.

## 2024-01-12 ENCOUNTER — Other Ambulatory Visit: Payer: Self-pay

## 2024-01-12 ENCOUNTER — Other Ambulatory Visit (HOSPITAL_COMMUNITY): Payer: Self-pay

## 2024-01-12 NOTE — Telephone Encounter (Signed)
 Patient can get a sooner appointment so we can test her memory, if she desires. Her next appointment is not until July 2025.

## 2024-01-12 NOTE — Telephone Encounter (Signed)
Mychart message sent to patient with providers response.

## 2024-01-20 ENCOUNTER — Other Ambulatory Visit: Payer: Self-pay | Admitting: Adult Health

## 2024-01-20 ENCOUNTER — Ambulatory Visit: Payer: Self-pay

## 2024-01-20 ENCOUNTER — Other Ambulatory Visit (HOSPITAL_COMMUNITY): Payer: Self-pay

## 2024-01-20 DIAGNOSIS — M21611 Bunion of right foot: Secondary | ICD-10-CM | POA: Diagnosis not present

## 2024-01-20 DIAGNOSIS — R300 Dysuria: Secondary | ICD-10-CM | POA: Diagnosis not present

## 2024-01-20 DIAGNOSIS — D2371 Other benign neoplasm of skin of right lower limb, including hip: Secondary | ICD-10-CM | POA: Diagnosis not present

## 2024-01-20 DIAGNOSIS — L602 Onychogryphosis: Secondary | ICD-10-CM | POA: Diagnosis not present

## 2024-01-20 DIAGNOSIS — N390 Urinary tract infection, site not specified: Secondary | ICD-10-CM

## 2024-01-20 DIAGNOSIS — M21612 Bunion of left foot: Secondary | ICD-10-CM | POA: Diagnosis not present

## 2024-01-20 DIAGNOSIS — M21961 Unspecified acquired deformity of right lower leg: Secondary | ICD-10-CM | POA: Diagnosis not present

## 2024-01-20 DIAGNOSIS — I70203 Unspecified atherosclerosis of native arteries of extremities, bilateral legs: Secondary | ICD-10-CM | POA: Diagnosis not present

## 2024-01-20 DIAGNOSIS — M2041 Other hammer toe(s) (acquired), right foot: Secondary | ICD-10-CM | POA: Diagnosis not present

## 2024-01-20 DIAGNOSIS — Q828 Other specified congenital malformations of skin: Secondary | ICD-10-CM | POA: Diagnosis not present

## 2024-01-20 DIAGNOSIS — M21962 Unspecified acquired deformity of left lower leg: Secondary | ICD-10-CM | POA: Diagnosis not present

## 2024-01-20 MED ORDER — ESTRADIOL 0.25 MG/0.25GM TD GEL
1.0000 | TRANSDERMAL | 3 refills | Status: DC
  Filled 2024-01-20: qty 30, 100d supply, fill #0

## 2024-01-20 MED ORDER — AMOXICILLIN-POT CLAVULANATE 875-125 MG PO TABS
1.0000 | ORAL_TABLET | Freq: Two times a day (BID) | ORAL | 0 refills | Status: DC
  Filled 2024-01-20: qty 10, 5d supply, fill #0

## 2024-01-20 NOTE — Telephone Encounter (Signed)
 Noted

## 2024-01-20 NOTE — Telephone Encounter (Signed)
 Se is no longer on Imitrex  but currently on Topamax . Does she want Topamax  eRx?

## 2024-01-20 NOTE — Telephone Encounter (Signed)
 Copied from CRM [id number]. Topic: Clinical - Medication Refill >> Jan 20, 2024 10:26 AM Carrielelia G wrote: Medication:  Estradiol  0.25 MG/0.25GM GEL,  (would like an extended refill request)   SUMAtriptan  (IMITREX ) 25 MG tablet    Has the patient contacted their pharmacy? No (Agent: If no, request that the patient contact the pharmacy for the refill. If patient does not wish to contact the pharmacy document the reason why and proceed with request.) (Agent: If yes, when and what did the pharmacy advise?)  This is the patient's preferred pharmacy:  [place] [facility] [address] [place] [address] Phone: [phone] Fax: [phone]    Is this the correct pharmacy for this prescription? Yes If no, delete pharmacy and type the correct one.    Is the patient out of the medication? No  Has the patient been seen for an appointment in the last year OR does the patient have an upcoming appointment? Yes  Can we respond through MyChart? Yes  Agent: Please be advised that Rx refills may take up to 3 business days. We ask that you follow-up with your pharmacy.

## 2024-01-20 NOTE — Telephone Encounter (Signed)
Message routed to PCP Medina-Vargas, Monina C, NP  

## 2024-01-20 NOTE — Telephone Encounter (Signed)
 FYI Only or Action Required?: FYI only for provider.  Patient was last seen in primary care on 12/24/2023 by Medina-Vargas, Jereld BROCKS, NP. Called Nurse Triage reporting Urinary Frequency. Symptoms began several days ago. Interventions attempted: Other: drinking fluids. Symptoms are: unchanged.  Triage Disposition: See Physician Within 24 Hours  Patient/caregiver understands and will follow disposition?: Yes  **Pt. Is going out of town this weekend, there aren't any available appointments until 7/3. Pt. Will go to UC for immediate evaluation/treatment. **                        Copied from CRM [id number]. Topic: Clinical - Red Word Triage >> Jan 20, 2024  3:18 PM Adrianna P wrote: Red Word that prompted transfer to Nurse Triage: Burning when urinating , frequent urge to go Reason for Disposition  Urinating more frequently than usual (i.e., frequency)  Answer Assessment - Initial Assessment Questions 1. SYMPTOM: What's the main symptom you're concerned about? (e.g., frequency, incontinence)     Burning, small amounts of urine, urge to urinate, frequency   2. ONSET: When did the symptoms start?     Chronic UTI's, persists over the last month, symptoms reoccurred a few days ago  3. PAIN: Is there any pain? If Yes, ask: How bad is it? (Scale: 1-10; mild, moderate, severe)     No   4. CAUSE: What do you think is causing the symptoms?    Chronic UTI's   5. OTHER SYMPTOMS: Do you have any other symptoms? (e.g., blood in urine, fever, flank pain, pain with urination)     No  Protocols used: Urinary Symptoms-A-AH

## 2024-01-20 NOTE — Telephone Encounter (Signed)
 MyChart message copy, pasted, and sent to patient as reply.

## 2024-01-20 NOTE — Telephone Encounter (Signed)
 Message routed to PCP Medina-Vargas, Monina C, NP as FYI.

## 2024-01-20 NOTE — Telephone Encounter (Signed)
 Message routed to PCP Medina-Vargas, Monina C, NP . When replying to patient please open encounter and select Reply to patient.

## 2024-01-20 NOTE — Telephone Encounter (Signed)
 Tried to get in contact with patient regarding rx refill. Left voicemail for patient to give us  a call back

## 2024-01-20 NOTE — Telephone Encounter (Signed)
 I have sent Estradiol  eRx to your pharmacy but Imitrex  is not on your list. You are currently taking Topamax . Do you need me to send you Topamax  eRx?

## 2024-01-21 ENCOUNTER — Other Ambulatory Visit (HOSPITAL_COMMUNITY): Payer: Self-pay

## 2024-01-21 ENCOUNTER — Other Ambulatory Visit: Payer: Self-pay | Admitting: Adult Health

## 2024-01-21 DIAGNOSIS — R519 Headache, unspecified: Secondary | ICD-10-CM

## 2024-01-21 MED ORDER — SUMATRIPTAN SUCCINATE 50 MG PO TABS
50.0000 mg | ORAL_TABLET | ORAL | 0 refills | Status: DC | PRN
  Filled 2024-01-21: qty 10, 1d supply, fill #0

## 2024-01-21 MED ORDER — SUMATRIPTAN SUCCINATE 25 MG PO TABS
25.0000 mg | ORAL_TABLET | Freq: Every day | ORAL | 0 refills | Status: DC | PRN
  Filled 2024-01-21: qty 9, 30d supply, fill #0

## 2024-01-21 NOTE — Addendum Note (Signed)
 Addended by: PHYLLIS MUTTER C on: 01/21/2024 12:36 PM   Modules accepted: Orders

## 2024-01-21 NOTE — Telephone Encounter (Signed)
 Sent eRx for Imitrex  to pharmacy.

## 2024-01-21 NOTE — Telephone Encounter (Signed)
 Spoke with patient regarding SUMAtriptan  (IMITREX ) 25 MG  patient stated she wanted to request this medication for safety measures incase she has a migraine. Informed pt I will speak with Medina-Vargas, Monina C, NP to verify if this is okay and would give her a call back regarding Medina-Vargas, Monina C, NP response.

## 2024-01-25 ENCOUNTER — Other Ambulatory Visit: Payer: Self-pay | Admitting: Cardiology

## 2024-01-25 ENCOUNTER — Other Ambulatory Visit (HOSPITAL_COMMUNITY): Payer: Self-pay

## 2024-01-25 DIAGNOSIS — I4819 Other persistent atrial fibrillation: Secondary | ICD-10-CM

## 2024-01-25 MED ORDER — APIXABAN 5 MG PO TABS
5.0000 mg | ORAL_TABLET | Freq: Two times a day (BID) | ORAL | 5 refills | Status: DC
  Filled 2024-01-25 – 2024-02-04 (×2): qty 60, 30d supply, fill #0
  Filled 2024-02-29 – 2024-03-11 (×2): qty 60, 30d supply, fill #1
  Filled 2024-04-04: qty 60, 30d supply, fill #2
  Filled 2024-05-03: qty 60, 30d supply, fill #3
  Filled 2024-06-02 – 2024-06-13 (×2): qty 60, 30d supply, fill #4

## 2024-01-25 NOTE — Telephone Encounter (Signed)
 Prescription refill request for Eliquis  received. Indication:afib Last office visit:1/25 Scr:0.73  1/25 Age: 83 Weight:61.8  kg  Prescription refilled

## 2024-01-26 ENCOUNTER — Other Ambulatory Visit: Payer: Self-pay | Admitting: Cardiology

## 2024-01-26 ENCOUNTER — Other Ambulatory Visit (HOSPITAL_COMMUNITY): Payer: Self-pay

## 2024-01-26 ENCOUNTER — Telehealth: Payer: Self-pay

## 2024-01-26 DIAGNOSIS — N390 Urinary tract infection, site not specified: Secondary | ICD-10-CM

## 2024-01-26 MED ORDER — ESTRADIOL 0.25 MG/0.25GM TD GEL: 1.0000 | TRANSDERMAL | 3 refills | Status: DC

## 2024-01-26 MED ORDER — ROSUVASTATIN CALCIUM 10 MG PO TABS
10.0000 mg | ORAL_TABLET | Freq: Every day | ORAL | 3 refills | Status: AC
  Filled 2024-02-12: qty 90, 90d supply, fill #0
  Filled 2024-05-08: qty 90, 90d supply, fill #1
  Filled 2024-08-13: qty 90, 90d supply, fill #2

## 2024-01-26 NOTE — Telephone Encounter (Signed)
 RX sent on 01/21/24. I called the pharmacy and spoke with Norman and they said rx was received however the instructions need to be cleaned up to only say vaginal area and not to skin and vaginal area, also the quantity should be 30

## 2024-01-26 NOTE — Addendum Note (Signed)
 Addended by: SUELLEN DEVIN BROCKS on: 01/26/2024 03:46 PM   Modules accepted: Orders

## 2024-01-26 NOTE — Telephone Encounter (Signed)
 Copied from CRM [id number]. Topic: Clinical - Prescription Issue >> Jan 26, 2024 11:59 AM Zane F wrote: Reason for CRM:   Patient is calling into the office to obtain an update on the Estradiol  0.25 MG/0.25GM GEL being sent to :   [id number] - [place], [place] - [address], [place] [address] Phone: [phone]  Fax: [phone]    The specialist did see that the prescription was sent over but the office never received confirmation. Please resend and  please contact the pharmacy in [place], [place] to ensure they have the prescription. The pharmacy relay to the patient that they have not heard anything from our office in reference to the prescription.   Patient Callback Number: [phone] >> Jan 26, 2024 12:07 PM Zane F wrote[place] if the office is not able to get in contact with her know it is due to lack of mobile service

## 2024-01-27 NOTE — Progress Notes (Signed)
 HPI: Follow-up atrial fibrillation. Cardiac CTA September 2022 showed calcium  score 192 which was 63rd percentile, 25 to 49% proximal LAD which had negative FFR. Echocardiogram repeated March 2023 and showed normal LV function, moderate left atrial enlargement, moderate right atrial enlargement, mild mitral regurgitation, mild to moderate tricuspid regurgitation, mild aortic insufficiency. Cardiac CTA November 2023 showed mild biatrial enlargement, small PFO, calcium  score 140 which is 55th percentile.  Patient underwent atrial fibrillation ablation 06/10/22.  Had repeat cardioversion October 2024 (long post cardioversion pauses noted).  CTA of the neck January 2025 showed no large vessel occlusion or hemodynamically significant stenosis.  Since last seen she notes some increased dyspnea on exertion with more vigorous activities.  No orthopnea, PND, pedal edema, chest pain, palpitations, syncope or bleeding.  Current Outpatient Medications  Medication Sig Dispense Refill   apixaban  (ELIQUIS ) 5 MG TABS tablet Take 1 tablet (5 mg total) by mouth 2 (two) times daily. 60 tablet 5   Brimonidine Tartrate (LUMIFY) 0.025 % SOLN Place 1 drop into both eyes daily. Red eyes     Carboxymeth-Glyc-Polysorb PF (REFRESH OPTIVE MEGA-3) 0.5-1-0.5 % SOLN Place 1 drop into both eyes 4 (four) times daily as needed (dry eyes). Enhance with flax seed oil     cetirizine  (ZYRTEC ) 10 MG tablet Take 1 tablet (10 mg total) by mouth daily as needed for allergies.     ketotifen (ZADITOR) 0.025 % ophthalmic solution Place 1 drop into both eyes 2 (two) times daily as needed (itchy eyes). In the fall     Multiple Vitamin (ONE-DAILY MULTI VITAMINS PO) Take 30 mLs by mouth daily. Ronal Dines liquid vitamin     OVER THE COUNTER MEDICATION Place 1 application  into both eyes at bedtime. Pure & Clean     rosuvastatin  (CRESTOR ) 10 MG tablet Take 1 tablet (10 mg total) by mouth daily. (Patient taking differently: Take 20 mg by mouth  daily.) 90 tablet 3   triamcinolone cream (KENALOG) 0.1 % Apply 1 Application topically 2 (two) times daily as needed (itching).     Vitamins C E (CRANBERRY URINARY COMFORT PO) Take by mouth.     acetaminophen  (TYLENOL ) 500 MG tablet Take 2 tablets (1,000 mg total) by mouth every 8 (eight) hours as needed. (Patient not taking: Reported on 02/04/2024)     amoxicillin  (AMOXIL ) 500 MG capsule Take 4 capsules (2,000 mg total) by mouth 1 hour prior to appointment. (Patient not taking: Reported on 02/04/2024) 4 capsule 0   amoxicillin -clavulanate (AUGMENTIN ) 875-125 MG tablet Take 1 tablet by mouth 2 (two) times daily for 5 days. 10 tablet 0   diltiazem  (CARDIZEM ) 30 MG tablet Take 1 tablet every 4 hours AS NEEDED for AFIB heart rate >100 as long as top BP >100. (Patient not taking: Reported on 02/04/2024) 30 tablet 1   Estradiol  0.25 MG/0.25GM GEL Place 1 Application vaginally 2 (two) times a week. 30 each 0   hydrocortisone  cream 0.5 % Apply 1 Application topically 2 (two) times daily as needed for itching.     mineral oil-hydrophilic petrolatum (AQUAPHOR) ointment Apply 1 Application topically as needed for dry skin.     NON FORMULARY Take 1,000 mg by mouth daily. Plant calcium  (Patient not taking: Reported on 02/04/2024)     SUMAtriptan  (IMITREX ) 25 MG tablet Take 1 tablet (25 mg total) by mouth daily as needed for migraine. May repeat in 2 hours if headache persists or recurs. Do not take more than 2 tablets/day. 10 tablet 0  topiramate  (TOPAMAX ) 25 MG tablet Take 1 tablet (25 mg total) by mouth daily. 30 tablet 3   No current facility-administered medications for this visit.     Past Medical History:  Diagnosis Date   Anxiety    Arthritis    left knee; left hip; lower back; mild to moderate in right hip (09/30/2016)   Atrial fibrillation (HCC)    Chronic lower back pain    Gastritis    Heart palpitations    History of bone density study 2018   History of mammogram 2021   History of MRI 2018    History of Papanicolaou smear of cervix    Hypercholesteremia    Osteopenia    Seasonal allergies     Past Surgical History:  Procedure Laterality Date   ATRIAL FIBRILLATION ABLATION N/A 06/10/2022   Procedure: ATRIAL FIBRILLATION ABLATION;  Surgeon: Inocencio Soyla Lunger, MD;  Location: [facility];  Service: Cardiovascular;  Laterality: N/A;   CARDIOVERSION N/A 11/17/2019   Procedure: CARDIOVERSION;  Surgeon: Pietro Redell RAMAN, MD;  Location: [facility] ENDOSCOPY;  Service: Cardiovascular;  Laterality: N/A;   CARDIOVERSION N/A 04/16/2021   Procedure: CARDIOVERSION;  Surgeon: Raford Riggs, MD;  Location: [facility] ENDOSCOPY;  Service: Cardiovascular;  Laterality: N/A;   CARDIOVERSION N/A 08/15/2021   Procedure: CARDIOVERSION;  Surgeon: Alvan Ronal BRAVO, MD;  Location: [facility] ENDOSCOPY;  Service: Cardiovascular;  Laterality: N/A;   CARDIOVERSION N/A 05/25/2023   Procedure: CARDIOVERSION;  Surgeon: Mona Vinie BROCKS, MD;  Location: [facility] INVAS[facility] CV LAB;  Service: Cardiovascular;  Laterality: N/A;   COLONOSCOPY     DILATION AND CURETTAGE OF UTERUS     JOINT REPLACEMENT     TONSILLECTOMY     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   Procedure: TOTAL HIP ARTHROPLASTY ANTERIOR APPROACH;  Surgeon: Yvone Rush, MD;  Location: [facility] OR;  Service: Orthopedics;  Laterality: Left;    Social History   Socioeconomic History   Marital status: Married    Spouse name: Not on file   Number of children: 3   Years of education: Not on file   Highest education level: Not on file  Occupational History   Not on file  Tobacco Use   Smoking status: Former    Current packs/day: 2.00    Types: Cigarettes   Smokeless tobacco: Never   Tobacco comments:    Former smoker 07/08/22  Vaping Use   Vaping status: Never Used  Substance and Sexual Activity   Alcohol use: Not Currently   Drug use: Never   Sexual activity: Yes  Other Topics Concern   Not on file  Social History Narrative   Tobacco use, amount per day  now: None.   Past tobacco use, amount per day: Maximum 2 packs    How many years did you use tobacco: Quit 1985   Alcohol use (drinks per week): 3   Diet: Vegetables, Chicken, Fish, Grains, and Fruit.   Do you drink/eat things with caffeine: Coffee   Marital status: Married                                  What year were you married? 1969   Do you live in a house, apartment, assisted living, condo, trailer, etc.? House.   Is it one or more stories? 2 stories.   How many persons live in your home? 2   Do you have pets in your home?( please list) No.  Highest Level of education completed? Masters in Adult Educations.   Current or past profession: Art gallery manager    Do you exercise? Yes.                                  Type and how often? Floor excercises 5 times week. Walk 2 miles 6 times week.   Do you have a living will? Yes   Do you have a DNR form?  Yes                                 If not, do you want to discuss one?   Do you have signed POA/HPOA forms?  Yes                      If so, please bring to you appointment      Do you have any difficulty bathing or dressing yourself? No   Do you have any difficulty preparing food or eating? No   Do you have any difficulty managing your medications? No   Do you have any difficulty managing your finances? No   Do you have any difficulty affording your medications? No   Social Drivers of Corporate investment banker Strain: Not on file  Food Insecurity: No Food Insecurity (05/01/2023)   Hunger Vital Sign    Worried About Running Out of Food in the Last Year: Never true    Ran Out of Food in the Last Year: Never true  Transportation Needs: No Transportation Needs (05/01/2023)   PRAPARE - Administrator, Civil Service (Medical): No    Lack of Transportation (Non-Medical): No  Physical Activity: Not on file  Stress: Stress Concern Present (05/01/2023)   [employer] of Occupational Health - Occupational Stress  Questionnaire    Feeling of Stress : To some extent  Social Connections: Unknown (11/12/2022)   Received from [facility]   Social Network    Social Network: Not on file  Intimate Partner Violence: Unknown (11/12/2022)   Received from [facility]   HITS    Physically Hurt: Not on file    Insult or Talk Down To: Not on file    Threaten Physical Harm: Not on file    Scream or Curse: Not on file    Family History  Problem Relation Age of Onset   Hypertension Mother    Cancer Father        lung cancer   Diabetes Sister    Anxiety disorder Daughter    Breast cancer Neg Hx     ROS: no fevers or chills, productive cough, hemoptysis, dysphasia, odynophagia, melena, hematochezia, dysuria, hematuria, rash, seizure activity, orthopnea, PND, pedal edema, claudication. Remaining systems are negative.  Physical Exam: Well-developed well-nourished in no acute distress.  Skin is warm and dry.  HEENT is normal.  Neck is supple.  Chest is clear to auscultation with normal expansion.  Cardiovascular exam is regular rate and rhythm.  Abdominal exam nontender or distended. No masses palpated. Extremities show no edema. neuro grossly intact   A/P  1 paroxysmal atrial fibrillation-patient is status post ablation but did have recurrence.  Continue apixaban .  Check hemoglobin and renal function.  She is in sinus rhythm on exam today.  If she has more frequent episodes in the future will consider addition of  antiarrhythmic therapy.  Note she had a long post cardioversion pause previously.  2 coronary artery disease-mild on previous CTA.  Continue statin.  She is not on aspirin  given need for anticoagulation.  3 hyperlipidemia-continue statin.  Check lipids and liver.  4 dyspnea-patient does describe increased dyspnea on exertion.  Will repeat echocardiogram to reassess LV function and valvular regurgitation.  Redell Shallow, MD

## 2024-02-01 ENCOUNTER — Other Ambulatory Visit (HOSPITAL_COMMUNITY): Payer: Self-pay

## 2024-02-01 MED ORDER — ESTRADIOL 0.25 MG/0.25GM TD GEL
1.0000 | TRANSDERMAL | 0 refills | Status: DC
  Filled 2024-02-01: qty 30, 90d supply, fill #0
  Filled 2024-02-02: qty 30, 100d supply, fill #0

## 2024-02-01 NOTE — Addendum Note (Signed)
 Addended by: SUELLEN DEVIN BROCKS on: 02/01/2024 08:10 AM   Modules accepted: Orders

## 2024-02-01 NOTE — Telephone Encounter (Signed)
 Estradiol  gel is ordered only twice a week. How often were you using it? Why were you using it more often than usual? I see you have an appointment on 02/08/24. We can discuss this during the visit.  Shatima Zalar

## 2024-02-01 NOTE — Telephone Encounter (Signed)
Tried calling patient, LMOM to return call.

## 2024-02-02 ENCOUNTER — Other Ambulatory Visit (HOSPITAL_COMMUNITY): Payer: Self-pay

## 2024-02-02 ENCOUNTER — Ambulatory Visit: Payer: PPO | Admitting: Family

## 2024-02-02 NOTE — Telephone Encounter (Signed)
 Patient aware of response and plans to check with [facility] to see how much rx would cost if she pain out of pocket and plans to further discuss with Medina-Vargas, Monina C, NP on 7/14

## 2024-02-03 ENCOUNTER — Other Ambulatory Visit (HOSPITAL_COMMUNITY): Payer: Self-pay

## 2024-02-04 ENCOUNTER — Encounter: Payer: Self-pay | Admitting: Cardiology

## 2024-02-04 ENCOUNTER — Other Ambulatory Visit (HOSPITAL_COMMUNITY): Payer: Self-pay

## 2024-02-04 ENCOUNTER — Ambulatory Visit: Attending: Cardiology | Admitting: Cardiology

## 2024-02-04 VITALS — BP 110/70 | HR 53 | Ht 67.0 in | Wt 134.6 lb

## 2024-02-04 DIAGNOSIS — R0609 Other forms of dyspnea: Secondary | ICD-10-CM

## 2024-02-04 DIAGNOSIS — I2510 Atherosclerotic heart disease of native coronary artery without angina pectoris: Secondary | ICD-10-CM

## 2024-02-04 DIAGNOSIS — I4819 Other persistent atrial fibrillation: Secondary | ICD-10-CM

## 2024-02-04 NOTE — Patient Instructions (Signed)

## 2024-02-05 ENCOUNTER — Ambulatory Visit: Payer: Self-pay | Admitting: Cardiology

## 2024-02-05 LAB — COMPREHENSIVE METABOLIC PANEL WITH GFR
ALT: 37 IU/L — ABNORMAL HIGH (ref 0–32)
AST: 48 IU/L — ABNORMAL HIGH (ref 0–40)
Albumin: 4.4 g/dL (ref 3.7–4.7)
Alkaline Phosphatase: 76 IU/L (ref 44–121)
BUN/Creatinine Ratio: 33 — ABNORMAL HIGH (ref 12–28)
BUN: 25 mg/dL (ref 8–27)
Bilirubin Total: 0.6 mg/dL (ref 0.0–1.2)
CO2: 23 mmol/L (ref 20–29)
Calcium: 9.1 mg/dL (ref 8.7–10.3)
Chloride: 106 mmol/L (ref 96–106)
Creatinine, Ser: 0.76 mg/dL (ref 0.57–1.00)
Globulin, Total: 1.6 g/dL (ref 1.5–4.5)
Glucose: 87 mg/dL (ref 70–99)
Potassium: 4.3 mmol/L (ref 3.5–5.2)
Sodium: 142 mmol/L (ref 134–144)
Total Protein: 6 g/dL (ref 6.0–8.5)
eGFR: 79 mL/min/1.73 (ref >59)

## 2024-02-05 LAB — LIPID PANEL
Chol/HDL Ratio: 2.4 ratio (ref 0.0–4.4)
Cholesterol, Total: 147 mg/dL (ref 100–199)
HDL: 62 mg/dL (ref >39)
LDL Chol Calc (NIH): 73 mg/dL (ref 0–99)
Triglycerides: 55 mg/dL (ref 0–149)
VLDL Cholesterol Cal: 12 mg/dL (ref 5–40)

## 2024-02-05 LAB — CBC
Hematocrit: 43.2 % (ref 34.0–46.6)
Hemoglobin: 14.1 g/dL (ref 11.1–15.9)
MCH: 32.9 pg (ref 26.6–33.0)
MCHC: 32.6 g/dL (ref 31.5–35.7)
MCV: 101 fL — ABNORMAL HIGH (ref 79–97)
Platelets: 132 x10E3/uL — ABNORMAL LOW (ref 150–450)
RBC: 4.28 x10E6/uL (ref 3.77–5.28)
RDW: 11.7 % (ref 11.7–15.4)
WBC: 4.2 x10E3/uL (ref 3.4–10.8)

## 2024-02-08 ENCOUNTER — Ambulatory Visit: Admitting: Adult Health

## 2024-02-08 ENCOUNTER — Encounter: Payer: Self-pay | Admitting: Adult Health

## 2024-02-08 ENCOUNTER — Other Ambulatory Visit (HOSPITAL_COMMUNITY): Payer: Self-pay

## 2024-02-08 ENCOUNTER — Ambulatory Visit (INDEPENDENT_AMBULATORY_CARE_PROVIDER_SITE_OTHER): Admitting: Adult Health

## 2024-02-08 VITALS — BP 118/76 | HR 59 | Temp 97.1°F | Resp 18 | Ht 67.0 in | Wt 136.8 lb

## 2024-02-08 DIAGNOSIS — N390 Urinary tract infection, site not specified: Secondary | ICD-10-CM

## 2024-02-08 DIAGNOSIS — I480 Paroxysmal atrial fibrillation: Secondary | ICD-10-CM | POA: Diagnosis not present

## 2024-02-08 DIAGNOSIS — R519 Headache, unspecified: Secondary | ICD-10-CM | POA: Diagnosis not present

## 2024-02-08 DIAGNOSIS — E785 Hyperlipidemia, unspecified: Secondary | ICD-10-CM | POA: Diagnosis not present

## 2024-02-08 DIAGNOSIS — F321 Major depressive disorder, single episode, moderate: Secondary | ICD-10-CM

## 2024-02-08 MED ORDER — ESTROGENS CONJUGATED 0.625 MG/GM VA CREA
TOPICAL_CREAM | VAGINAL | 0 refills | Status: DC
  Filled 2024-02-08: qty 30, 100d supply, fill #0

## 2024-02-08 MED ORDER — ESTRADIOL 0.1 MG/GM VA CREA
TOPICAL_CREAM | VAGINAL | 1 refills | Status: AC
  Filled 2024-02-08: qty 60, fill #0
  Filled 2024-02-08: qty 42.5, 100d supply, fill #0
  Filled 2024-02-08: qty 30, 28d supply, fill #0
  Filled 2024-02-08: qty 60, 100d supply, fill #0

## 2024-02-08 NOTE — Progress Notes (Signed)
 [facility] clinic  Provider:  Jereld Serum DNP  Code Status:  Full Code  Goals of Care:     10/05/2023    3:40 PM  Advanced Directives  Does Patient Have a Medical Advance Directive? Yes  Type of Estate agent of [place];Living will;Out of facility DNR (pink MOST or yellow form)  Does patient want to make changes to medical advance directive? No - Patient declined  Copy of Healthcare Power of Attorney in Chart? Yes - validated most recent copy scanned in chart (See row information)  Would patient like information on creating a medical advance directive? No - Patient declined     Chief Complaint  Patient presents with   Medical Management of Chronic Issues     6 mth chronic issues    Discussed the use of AI scribe software for clinical note transcription with the patient, who gave verbal consent to proceed   HPI: Patient is a 83 y.o. female seen today for a 53-month follow up of chronic medical issues.. She was accompanied by her daughter.  She has a history of recurrent urinary tract infections (UTIs) and is currently using estradiol  twice a week. She reports occasional urgency to urinate with minimal output but no current symptoms of a UTI.  She experiences headaches characterized by brief, light jabs occurring about three times a week. She has not used Imitrex  recently as the headaches resolve quickly on their own.  She is are currently taking rosuvastatin  for hyperlipidemia.   She is on Eliquis  for atrial fibrillation and has a history of using diltiazem  as needed for palpitations, though she reports rarely using it.     Past Medical History:  Diagnosis Date   Anxiety    Arthritis    left knee; left hip; lower back; mild to moderate in right hip (09/30/2016)   Atrial fibrillation (HCC)    Chronic lower back pain    Gastritis    Heart palpitations    History of bone density study 2018   History of mammogram 2021   History of MRI 2018    History of Papanicolaou smear of cervix    Hypercholesteremia    Osteopenia    Seasonal allergies     Past Surgical History:  Procedure Laterality Date   ATRIAL FIBRILLATION ABLATION N/A 06/10/2022   Procedure: ATRIAL FIBRILLATION ABLATION;  Surgeon: Inocencio Soyla Lunger, MD;  Location: [facility];  Service: Cardiovascular;  Laterality: N/A;   CARDIOVERSION N/A 11/17/2019   Procedure: CARDIOVERSION;  Surgeon: Pietro Redell RAMAN, MD;  Location: [facility] ENDOSCOPY;  Service: Cardiovascular;  Laterality: N/A;   CARDIOVERSION N/A 04/16/2021   Procedure: CARDIOVERSION;  Surgeon: Raford Riggs, MD;  Location: [facility] ENDOSCOPY;  Service: Cardiovascular;  Laterality: N/A;   CARDIOVERSION N/A 08/15/2021   Procedure: CARDIOVERSION;  Surgeon: Alvan Ronal BRAVO, MD;  Location: [facility] ENDOSCOPY;  Service: Cardiovascular;  Laterality: N/A;   CARDIOVERSION N/A 05/25/2023   Procedure: CARDIOVERSION;  Surgeon: Mona Vinie BROCKS, MD;  Location: [facility];  Service: Cardiovascular;  Laterality: N/A;   COLONOSCOPY     DILATION AND CURETTAGE OF UTERUS     JOINT REPLACEMENT     TONSILLECTOMY     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   Procedure: TOTAL HIP ARTHROPLASTY ANTERIOR APPROACH;  Surgeon: Yvone Rush, MD;  Location: [facility] OR;  Service: Orthopedics;  Laterality: Left;    No Known Allergies  Outpatient Encounter Medications as of 02/08/2024  Medication Sig   acetaminophen  (TYLENOL ) 500 MG  tablet Take 2 tablets (1,000 mg total) by mouth every 8 (eight) hours as needed.   amoxicillin -clavulanate (AUGMENTIN ) 875-125 MG tablet Take 1 tablet by mouth 2 (two) times daily for 5 days.   apixaban  (ELIQUIS ) 5 MG TABS tablet Take 1 tablet (5 mg total) by mouth 2 (two) times daily.   Brimonidine Tartrate (LUMIFY) 0.025 % SOLN Place 1 drop into both eyes daily. Red eyes   Carboxymeth-Glyc-Polysorb PF (REFRESH OPTIVE MEGA-3) 0.5-1-0.5 % SOLN Place 1 drop into both eyes 4 (four) times daily as needed (dry eyes).  Enhance with flax seed oil   cetirizine  (ZYRTEC ) 10 MG tablet Take 1 tablet (10 mg total) by mouth daily as needed for allergies.   hydrocortisone  cream 0.5 % Apply 1 Application topically 2 (two) times daily as needed for itching.   ketotifen (ZADITOR) 0.025 % ophthalmic solution Place 1 drop into both eyes 2 (two) times daily as needed (itchy eyes). In the fall   mineral oil-hydrophilic petrolatum (AQUAPHOR) ointment Apply 1 Application topically as needed for dry skin.   Multiple Vitamin (ONE-DAILY MULTI VITAMINS PO) Take 30 mLs by mouth daily. Ronal Dines liquid vitamin   OVER THE COUNTER MEDICATION Place 1 application  into both eyes at bedtime. Pure & Clean   rosuvastatin  (CRESTOR ) 10 MG tablet Take 1 tablet (10 mg total) by mouth daily. (Patient taking differently: Take 20 mg by mouth daily.)   SUMAtriptan  (IMITREX ) 25 MG tablet Take 1 tablet (25 mg total) by mouth daily as needed for migraine. May repeat in 2 hours if headache persists or recurs. Do not take more than 2 tablets/day.   triamcinolone cream (KENALOG) 0.1 % Apply 1 Application topically 2 (two) times daily as needed (itching).   Vitamins C E (CRANBERRY URINARY COMFORT PO) Take by mouth.   [DISCONTINUED] Estradiol  0.25 MG/0.25GM GEL Place 1 Application vaginally 2 (two) times a week.   amoxicillin  (AMOXIL ) 500 MG capsule Take 4 capsules (2,000 mg total) by mouth 1 hour prior to appointment. (Patient not taking: Reported on 02/08/2024)   diltiazem  (CARDIZEM ) 30 MG tablet Take 1 tablet every 4 hours AS NEEDED for AFIB heart rate >100 as long as top BP >100. (Patient not taking: Reported on 02/08/2024)   estradiol  (ESTRACE ) 0.1 MG/GM vaginal cream Apply to vaginal area 2 (two) times a week as directed.   NON FORMULARY Take 1,000 mg by mouth daily. Plant calcium  (Patient not taking: Reported on 02/08/2024)   [DISCONTINUED] topiramate  (TOPAMAX ) 25 MG tablet Take 1 tablet (25 mg total) by mouth daily. (Patient not taking: Reported on  02/08/2024)   No facility-administered encounter medications on file as of 02/08/2024.    Review of Systems:  Review of Systems  Constitutional:  Negative for appetite change, chills, fatigue and fever.  HENT:  Negative for congestion, hearing loss, rhinorrhea and sore throat.   Eyes: Negative.   Respiratory:  Negative for cough, shortness of breath and wheezing.   Cardiovascular:  Negative for chest pain, palpitations and leg swelling.  Gastrointestinal:  Negative for abdominal pain, constipation, diarrhea, nausea and vomiting.  Genitourinary:  Negative for dysuria.  Musculoskeletal:  Negative for arthralgias, back pain and myalgias.  Skin:  Negative for color change, rash and wound.  Neurological:  Negative for dizziness, weakness and headaches.  Psychiatric/Behavioral:  Negative for behavioral problems. The patient is not nervous/anxious.     Health Maintenance  Topic Date Due   COVID-19 Vaccine (8 - Pfizer risk 2024-25 season) 02/26/2024 (Originally 12/06/2023)   INFLUENZA  VACCINE  02/26/2024   Medicare Annual Wellness (AWV)  04/30/2024   DTaP/Tdap/Td (4 - Td or Tdap) 12/26/2027   Pneumococcal Vaccine: 50+ Years  Completed   DEXA SCAN  Completed   Zoster Vaccines- Shingrix  Completed   Hepatitis B Vaccines  Aged Out   HPV VACCINES  Aged Out   Meningococcal B Vaccine  Aged Out   Hepatitis C Screening  Discontinued    Physical Exam: Vitals:   02/08/24 0833  BP: 118/76  Pulse: (!) 59  Resp: 18  Temp: (!) 97.1 F (36.2 C)  SpO2: 99%  Weight: 136 lb 12.8 oz (62.1 kg)  Height: 5' 7 (1.702 m)   Body mass index is 21.43 kg/m. Physical Exam Constitutional:      Appearance: Normal appearance.  HENT:     Head: Normocephalic and atraumatic.     Nose: Nose normal.     Mouth/Throat:     Mouth: Mucous membranes are moist.  Eyes:     Conjunctiva/sclera: Conjunctivae normal.  Cardiovascular:     Rate and Rhythm: Normal rate and regular rhythm.  Pulmonary:     Effort:  Pulmonary effort is normal.     Breath sounds: Normal breath sounds.  Abdominal:     General: Bowel sounds are normal.     Palpations: Abdomen is soft.  Musculoskeletal:        General: Normal range of motion.     Cervical back: Normal range of motion.  Skin:    General: Skin is warm and dry.  Neurological:     General: No focal deficit present.     Mental Status: She is alert and oriented to person, place, and time.  Psychiatric:        Mood and Affect: Mood normal.        Behavior: Behavior normal.        Thought Content: Thought content normal.        Judgment: Judgment normal.     Labs reviewed: Basic Metabolic Panel: Recent Labs    08/11/23 0856 08/20/23 1645 08/27/23 1524 02/04/24 1418  NA 145 141 143 142  K 4.2 3.9 4.0 4.3  CL 108 105 108 106  CO2 30 27 25 23   GLUCOSE 74 107* 97 87  BUN 26* 18 19 25   CREATININE 0.77 0.67 0.73 0.76  CALCIUM  9.2 9.7 9.5 9.1  TSH 3.37  --   --   --    Liver Function Tests: Recent Labs    08/20/23 1645 08/27/23 1524 10/05/23 1612 02/04/24 1418  AST 33 40 29 48*  ALT 28 23 19  37*  ALKPHOS 60 52  --  76  BILITOT 1.0 1.2 0.6 0.6  PROT 6.3* 6.8 5.7* 6.0  ALBUMIN 4.5 4.4  --  4.4   No results for input(s): LIPASE, AMYLASE in the last 8760 hours. No results for input(s): AMMONIA in the last 8760 hours. CBC: Recent Labs    08/11/23 0856 08/20/23 1645 08/27/23 1524 02/04/24 1418  WBC 3.8 6.5 5.5 4.2  NEUTROABS 1,588 4.5 3.0  --   HGB 14.3 14.9 15.0 14.1  HCT 43.4 45.1 45.7 43.2  MCV 98.0 97.0 100.7* 101*  PLT 150 152 146* 132*   Lipid Panel: Recent Labs    08/11/23 0856 02/04/24 1418  CHOL 153 147  HDL 57 62  LDLCALC 82 73  TRIG 67 55  CHOLHDL 2.7 2.4   No results found for: HGBA1C  Procedures since last visit: No results found.  Assessment/Plan  1. PAF (paroxysmal atrial fibrillation) (HCC) (Primary) -  In sinus rhythm. Diltiazem  available for palpitations, rarely used. - Continue diltiazem   as needed.  2. Hyperlipidemia LDL goal <100 -  Continue rosuvastatin .  3. Nonintractable episodic headache, unspecified headache type -  Mild headaches three times a week, resolving without medication. Imitrex  available, Topamax  discontinued. - Discontinue Topamax . - Continue Imitrex  as needed.   4. Frequent UTI -  Recurrent UTIs, currently asymptomatic. Discussed estradiol  cream and urologist referral. Advised on fluid intake. - Use estradiol  cream twice a week. - Refer to urologist. - Encourage fluid intake. - estradiol  (ESTRACE ) 0.1 MG/GM vaginal cream; Apply to vaginal area 2 (two) times a week as directed.  Dispense: 42.5 g; Refill: 1 - Ambulatory referral to Urology  5. Depression, major, single episode, moderate (HCC) -  Moderate depression with PHQ-9 score of 10. Prefers monitoring over medication. Discussed Zoloft for depression and anxiety, noted concern about dizziness with Eliquis . - Monitor symptoms. - Discuss Zoloft 25 mg if symptoms worsen. - Encourage exercise.     Labs/tests ordered:   None   Return in about 3 months (around 05/10/2024).  Dailyn Reith Medina-Vargas, NP

## 2024-02-12 ENCOUNTER — Other Ambulatory Visit (HOSPITAL_COMMUNITY): Payer: Self-pay

## 2024-02-12 ENCOUNTER — Encounter: Payer: Self-pay | Admitting: Advanced Practice Midwife

## 2024-02-15 ENCOUNTER — Other Ambulatory Visit: Payer: Self-pay | Admitting: Adult Health

## 2024-02-15 ENCOUNTER — Other Ambulatory Visit (HOSPITAL_COMMUNITY): Payer: Self-pay

## 2024-02-15 DIAGNOSIS — M2041 Other hammer toe(s) (acquired), right foot: Secondary | ICD-10-CM | POA: Diagnosis not present

## 2024-02-15 DIAGNOSIS — M21962 Unspecified acquired deformity of left lower leg: Secondary | ICD-10-CM | POA: Diagnosis not present

## 2024-02-15 DIAGNOSIS — Q828 Other specified congenital malformations of skin: Secondary | ICD-10-CM | POA: Diagnosis not present

## 2024-02-15 DIAGNOSIS — M21961 Unspecified acquired deformity of right lower leg: Secondary | ICD-10-CM | POA: Diagnosis not present

## 2024-02-15 DIAGNOSIS — M21612 Bunion of left foot: Secondary | ICD-10-CM | POA: Diagnosis not present

## 2024-02-15 DIAGNOSIS — M21611 Bunion of right foot: Secondary | ICD-10-CM | POA: Diagnosis not present

## 2024-02-15 DIAGNOSIS — I70203 Unspecified atherosclerosis of native arteries of extremities, bilateral legs: Secondary | ICD-10-CM | POA: Diagnosis not present

## 2024-02-15 DIAGNOSIS — L602 Onychogryphosis: Secondary | ICD-10-CM | POA: Diagnosis not present

## 2024-02-15 DIAGNOSIS — D2371 Other benign neoplasm of skin of right lower limb, including hip: Secondary | ICD-10-CM | POA: Diagnosis not present

## 2024-02-15 MED ORDER — SUMATRIPTAN SUCCINATE 25 MG PO TABS: 25.0000 mg | ORAL_TABLET | Freq: Every day | ORAL | 0 refills | Status: DC | PRN

## 2024-02-18 DIAGNOSIS — D6869 Other thrombophilia: Secondary | ICD-10-CM | POA: Diagnosis not present

## 2024-02-18 DIAGNOSIS — G43909 Migraine, unspecified, not intractable, without status migrainosus: Secondary | ICD-10-CM | POA: Diagnosis not present

## 2024-02-18 DIAGNOSIS — H04129 Dry eye syndrome of unspecified lacrimal gland: Secondary | ICD-10-CM | POA: Diagnosis not present

## 2024-02-18 DIAGNOSIS — Z87891 Personal history of nicotine dependence: Secondary | ICD-10-CM | POA: Diagnosis not present

## 2024-02-18 DIAGNOSIS — E785 Hyperlipidemia, unspecified: Secondary | ICD-10-CM | POA: Diagnosis not present

## 2024-02-18 DIAGNOSIS — R32 Unspecified urinary incontinence: Secondary | ICD-10-CM | POA: Diagnosis not present

## 2024-02-18 DIAGNOSIS — I4891 Unspecified atrial fibrillation: Secondary | ICD-10-CM | POA: Diagnosis not present

## 2024-02-18 DIAGNOSIS — M8580 Other specified disorders of bone density and structure, unspecified site: Secondary | ICD-10-CM | POA: Diagnosis not present

## 2024-02-18 DIAGNOSIS — J309 Allergic rhinitis, unspecified: Secondary | ICD-10-CM | POA: Diagnosis not present

## 2024-02-18 DIAGNOSIS — N390 Urinary tract infection, site not specified: Secondary | ICD-10-CM | POA: Diagnosis not present

## 2024-02-18 LAB — HM DEXA SCAN

## 2024-02-22 ENCOUNTER — Encounter: Payer: Self-pay | Admitting: Adult Health

## 2024-02-23 ENCOUNTER — Emergency Department (HOSPITAL_COMMUNITY)

## 2024-02-23 ENCOUNTER — Encounter (HOSPITAL_COMMUNITY): Payer: Self-pay

## 2024-02-23 ENCOUNTER — Observation Stay (HOSPITAL_COMMUNITY)
Admission: EM | Admit: 2024-02-23 | Discharge: 2024-02-24 | Disposition: A | Discharge status: Home / Self Care | Attending: Cardiovascular Disease | Admitting: Cardiovascular Disease

## 2024-02-23 ENCOUNTER — Other Ambulatory Visit: Payer: Self-pay

## 2024-02-23 ENCOUNTER — Ambulatory Visit (HOSPITAL_COMMUNITY)
Admission: RE | Admit: 2024-02-23 | Discharge: 2024-02-23 | Disposition: A | Discharge status: Home / Self Care | Source: Ambulatory Visit | Attending: Cardiology | Admitting: Cardiology

## 2024-02-23 DIAGNOSIS — R9389 Abnormal findings on diagnostic imaging of other specified body structures: Principal | ICD-10-CM

## 2024-02-23 DIAGNOSIS — Z7901 Long term (current) use of anticoagulants: Secondary | ICD-10-CM | POA: Insufficient documentation

## 2024-02-23 DIAGNOSIS — I959 Hypotension, unspecified: Secondary | ICD-10-CM | POA: Diagnosis not present

## 2024-02-23 DIAGNOSIS — I480 Paroxysmal atrial fibrillation: Secondary | ICD-10-CM | POA: Diagnosis not present

## 2024-02-23 DIAGNOSIS — Z87891 Personal history of nicotine dependence: Secondary | ICD-10-CM | POA: Diagnosis not present

## 2024-02-23 DIAGNOSIS — I4819 Other persistent atrial fibrillation: Principal | ICD-10-CM | POA: Insufficient documentation

## 2024-02-23 DIAGNOSIS — E785 Hyperlipidemia, unspecified: Secondary | ICD-10-CM

## 2024-02-23 DIAGNOSIS — R0600 Dyspnea, unspecified: Secondary | ICD-10-CM | POA: Insufficient documentation

## 2024-02-23 DIAGNOSIS — R0609 Other forms of dyspnea: Secondary | ICD-10-CM

## 2024-02-23 DIAGNOSIS — R931 Abnormal findings on diagnostic imaging of heart and coronary circulation: Secondary | ICD-10-CM

## 2024-02-23 DIAGNOSIS — I2510 Atherosclerotic heart disease of native coronary artery without angina pectoris: Secondary | ICD-10-CM | POA: Diagnosis not present

## 2024-02-23 DIAGNOSIS — I5189 Other ill-defined heart diseases: Secondary | ICD-10-CM | POA: Insufficient documentation

## 2024-02-23 DIAGNOSIS — R0602 Shortness of breath: Secondary | ICD-10-CM

## 2024-02-23 DIAGNOSIS — R9431 Abnormal electrocardiogram [ECG] [EKG]: Secondary | ICD-10-CM | POA: Diagnosis present

## 2024-02-23 DIAGNOSIS — I517 Cardiomegaly: Secondary | ICD-10-CM | POA: Diagnosis not present

## 2024-02-23 LAB — ECHOCARDIOGRAM COMPLETE
AR max vel: 1.5 cm2
AV Area VTI: 1.47 cm2
AV Area mean vel: 1.42 cm2
AV Mean grad: 2.5 mmHg
AV Peak grad: 4.4 mmHg
Ao pk vel: 1.05 m/s
Area-P 1/2: 3.48 cm2
MV M vel: 4.77 m/s
MV Peak grad: 91 mmHg
P 1/2 time: 1263 ms
Radius: 0.43 cm
S' Lateral: 2.65 cm

## 2024-02-23 LAB — CBC
HCT: 42.8 % (ref 36.0–46.0)
Hemoglobin: 14.5 g/dL (ref 12.0–15.0)
MCH: 33.5 pg (ref 26.0–34.0)
MCHC: 33.9 g/dL (ref 30.0–36.0)
MCV: 98.8 fL (ref 80.0–100.0)
Platelets: 119 K/uL — ABNORMAL LOW (ref 150–400)
RBC: 4.33 MIL/uL (ref 3.87–5.11)
RDW: 11.4 % — ABNORMAL LOW (ref 11.5–15.5)
WBC: 4.8 K/uL (ref 4.0–10.5)
nRBC: 0 % (ref 0.0–0.2)

## 2024-02-23 LAB — BASIC METABOLIC PANEL WITH GFR
Anion gap: 8 (ref 5–15)
BUN: 21 mg/dL (ref 8–23)
CO2: 26 mmol/L (ref 22–32)
Calcium: 9.3 mg/dL (ref 8.9–10.3)
Chloride: 105 mmol/L (ref 98–111)
Creatinine, Ser: 0.78 mg/dL (ref 0.44–1.00)
GFR, Estimated: >60 mL/min (ref >60)
Glucose, Bld: 106 mg/dL — ABNORMAL HIGH (ref 70–99)
Potassium: 4.1 mmol/L (ref 3.5–5.1)
Sodium: 139 mmol/L (ref 135–145)

## 2024-02-23 LAB — TROPONIN I (HIGH SENSITIVITY)
Troponin I (High Sensitivity): 4 ng/L (ref <18)
Troponin I (High Sensitivity): 5 ng/L (ref <18)

## 2024-02-23 MED ORDER — IOHEXOL 350 MG/ML SOLN
75.0000 mL | Freq: Once | INTRAVENOUS | Status: AC | PRN
  Administered 2024-02-23: 75 mL via INTRAVENOUS

## 2024-02-23 MED ORDER — HEPARIN (PORCINE) 25000 UT/250ML-% IV SOLN
1100.0000 [IU]/h | INTRAVENOUS | Status: DC
  Administered 2024-02-23: 1100 [IU]/h via INTRAVENOUS
  Filled 2024-02-23: qty 250

## 2024-02-23 MED ORDER — ONDANSETRON HCL 4 MG/2ML IJ SOLN: 4.0000 mg | Freq: Four times a day (QID) | INTRAMUSCULAR | Status: DC | PRN

## 2024-02-23 MED ORDER — ACETAMINOPHEN 325 MG PO TABS: 650.0000 mg | ORAL_TABLET | ORAL | Status: DC | PRN

## 2024-02-23 MED ORDER — ROSUVASTATIN CALCIUM 20 MG PO TABS
20.0000 mg | ORAL_TABLET | Freq: Every day | ORAL | Status: DC
  Administered 2024-02-23 – 2024-02-24 (×2): 20 mg via ORAL
  Filled 2024-02-23 (×2): qty 1

## 2024-02-23 NOTE — ED Notes (Signed)
 Heparin  infusion temporarily stopped so that IV may be used for CT

## 2024-02-23 NOTE — ED Notes (Signed)
 CCMD called for cardiac monitoring.

## 2024-02-23 NOTE — Progress Notes (Signed)
 Cardiology Brief Note Late Entry.   Patient came in for an echocardiogram for evaluation of shortness of breath.  Echo tech reached out due to concerns for possible right atrial clot.  Images independently reviewed on multiple orthogonal views there are concerns for possible echodensity suggestive of a clot in the right chambers.  At the time of evaluation I was told that her blood pressure in 1 arm was 92/58 and the blood pressure in the other arm was 96/60.  No chest pain but felt lightheaded dizzy.  Given her symptoms, clinical suspicion, and soft blood pressures recommended that she be transferred to [name], ED for further evaluation.  These recommendations were provided to the echo tech over approximately 3:50 PM.  Spoke to ED physician Dr. Albertina at approximately 5:10 PM and given verbal signout with regards to concerns that led to EMS transfer.  Recommend ruling out DVT and PE.  Recommend admission to hospital medicine with cardiology consult.  Dr. Michele No charge 6:16 PM 02/23/24

## 2024-02-23 NOTE — ED Triage Notes (Addendum)
 Pt bib [facility] after patient had echo done concerning for right sided clot in heart out patient. Pt has hx of a-fib and takes eliquis . Denies chest pain but reports shortness of breath with exertion. GCS 15.   EMS VS: 154/80 60 HR 98% RA

## 2024-02-23 NOTE — Progress Notes (Signed)
 PHARMACY - ANTICOAGULATION CONSULT NOTE  Pharmacy Consult for heparin  initiation Indication: pulmonary embolus  No Known Allergies  Patient Measurements: Height: 5' 6.5 (168.9 cm) Weight: 60.8 kg (134 lb) IBW/kg (Calculated) : 60.45 HEPARIN  DW (KG): 60.8  Vital Signs: Temp: 97.9 F (36.6 C) (07/29 1645) Temp Source: Oral (07/29 1645) BP: 147/69 (07/29 1645) Pulse Rate: 65 (07/29 1645)  Labs: Recent Labs    02/23/24 1705  HGB 14.5  HCT 42.8  PLT 119*  CREATININE 0.78  TROPONINIHS 4    Estimated Creatinine Clearance: 52.7 mL/min (by C-G formula based on SCr of 0.78 mg/dL).   Medical History: Past Medical History:  Diagnosis Date   Anxiety    Arthritis    left knee; left hip; lower back; mild to moderate in right hip (09/30/2016)   Atrial fibrillation (HCC)    Chronic lower back pain    Gastritis    Heart palpitations    History of bone density study 2018   History of mammogram 2021   History of MRI 2018   History of Papanicolaou smear of cervix    Hypercholesteremia    Osteopenia    Seasonal allergies     Medications:  Eliquis  5mg  PO BID PTA  Assessment: 82 YOF w/ hx AF w/ c/f DVT/PE rule-out. Chest CT negative for PE. Pt had echo study earlier today that is c/f thrombus in right side of heart. Pt's BP is 147/69 now but reportedly 92/58 prior to echo. HGB 14.5, PLT 119. Per pt, she last took her eliquis  at 0830 this AM, 02/23/24, and has maybe missed 1 dose in the last 2 weeks. Will therapeutically monitor heparin  via aPTT.   Goal of Therapy:  Anti-Xa goal 0.3-0.7 aPTT 66-102 seconds Monitor platelets by anticoagulation protocol: Yes   Plan: Start heparin  infusion at 1100 units/hr, no bolus Check anti-Xa level in 8 hours and daily while on heparin  Continue to monitor H&H and platelets F/u on anticoagulation plan    Belvie Macintosh, PharmD Candidate 02/23/2024,6:26 PM

## 2024-02-23 NOTE — ED Notes (Signed)
 Patient ambulated to the restroom

## 2024-02-23 NOTE — Consult Note (Signed)
 CARDIOLOGY CONSULT NOTE       Patient ID: Shelia [name] MRN: [id number] DOB/AGE: 1940-11-07 83 y.o.  Admit date: 02/23/2024 Referring Physician: Davene ER  Primary Physician: Phyllis Jereld BROCKS, NP Primary Cardiologist: Pietro Reason for Consultation: Abnormal Echo  Active Problems:   * No active hospital problems. *   HPI:  83 y.o. having TTE at our office today. ? Abnormality / thrombus in RA Sent to ER by Dr Michele. TTE being done for more dyspnea lately. I reviewed her TTE EF normal Mild MR/AR. Only in subcostal view is abnormality seen in RA. Not clear if it is artifact or thrombus or other. It does not move with the heart and may be a duplication artifact of some chordae. She has had prior afib ablation and is on eliquis  She has missed 203 doses in last 2 weeks. She is in NSR today. She denies LE edema pro pain No clinical signs of DVT. She is pending CTA to image atrial and R/O PE given her dyspnea.   ROS All other systems reviewed and negative except as noted above  Past Medical History:  Diagnosis Date   Anxiety    Arthritis    left knee; left hip; lower back; mild to moderate in right hip (09/30/2016)   Atrial fibrillation (HCC)    Chronic lower back pain    Gastritis    Heart palpitations    History of bone density study 2018   History of mammogram 2021   History of MRI 2018   History of Papanicolaou smear of cervix    Hypercholesteremia    Osteopenia    Seasonal allergies     Family History  Problem Relation Age of Onset   Hypertension Mother    Cancer Father        lung cancer   Diabetes Sister    Anxiety disorder Daughter    Breast cancer Neg Hx     Social History   Socioeconomic History   Marital status: Married    Spouse name: Not on file   Number of children: 3   Years of education: Not on file   Highest education level: Not on file  Occupational History   Not on file  Tobacco Use   Smoking status: Former    Current packs/day:  2.00    Types: Cigarettes   Smokeless tobacco: Never   Tobacco comments:    Former smoker 07/08/22  Vaping Use   Vaping status: Never Used  Substance and Sexual Activity   Alcohol use: Not Currently   Drug use: Never   Sexual activity: Yes  Other Topics Concern   Not on file  Social History Narrative   Tobacco use, amount per day now: None.   Past tobacco use, amount per day: Maximum 2 packs    How many years did you use tobacco: Quit 1985   Alcohol use (drinks per week): 3   Diet: Vegetables, Chicken, Fish, Grains, and Fruit.   Do you drink/eat things with caffeine: Coffee   Marital status: Married                                  What year were you married? 1969   Do you live in a house, apartment, assisted living, condo, trailer, etc.? House.   Is it one or more stories? 2 stories.   How many persons live in your home? 2   Do  you have pets in your home?( please list) No.   Highest Level of education completed? Masters in Adult Educations.   Current or past profession: Art gallery manager    Do you exercise? Yes.                                  Type and how often? Floor excercises 5 times week. Walk 2 miles 6 times week.   Do you have a living will? Yes   Do you have a DNR form?  Yes                                 If not, do you want to discuss one?   Do you have signed POA/HPOA forms?  Yes                      If so, please bring to you appointment      Do you have any difficulty bathing or dressing yourself? No   Do you have any difficulty preparing food or eating? No   Do you have any difficulty managing your medications? No   Do you have any difficulty managing your finances? No   Do you have any difficulty affording your medications? No   Social Drivers of Corporate investment banker Strain: Not on file  Food Insecurity: No Food Insecurity (05/01/2023)   Hunger Vital Sign    Worried About Running Out of Food in the Last Year: Never true    Ran Out of Food in the Last  Year: Never true  Transportation Needs: No Transportation Needs (05/01/2023)   PRAPARE - Administrator, Civil Service (Medical): No    Lack of Transportation (Non-Medical): No  Physical Activity: Not on file  Stress: Stress Concern Present (05/01/2023)   [employer] of Occupational Health - Occupational Stress Questionnaire    Feeling of Stress : To some extent  Social Connections: Unknown (11/12/2022)   Received from [facility]   Social Network    Social Network: Not on file  Intimate Partner Violence: Unknown (11/12/2022)   Received from [facility]   HITS    Physically Hurt: Not on file    Insult or Talk Down To: Not on file    Threaten Physical Harm: Not on file    Scream or Curse: Not on file    Past Surgical History:  Procedure Laterality Date   ATRIAL FIBRILLATION ABLATION N/A 06/10/2022   Procedure: ATRIAL FIBRILLATION ABLATION;  Surgeon: Inocencio Soyla Lunger, MD;  Location: [facility] INVASIVE CV LAB;  Service: Cardiovascular;  Laterality: N/A;   CARDIOVERSION N/A 11/17/2019   Procedure: CARDIOVERSION;  Surgeon: Pietro Redell RAMAN, MD;  Location: [facility] ENDOSCOPY;  Service: Cardiovascular;  Laterality: N/A;   CARDIOVERSION N/A 04/16/2021   Procedure: CARDIOVERSION;  Surgeon: Raford Riggs, MD;  Location: [facility] ENDOSCOPY;  Service: Cardiovascular;  Laterality: N/A;   CARDIOVERSION N/A 08/15/2021   Procedure: CARDIOVERSION;  Surgeon: Alvan Ronal BRAVO, MD;  Location: [facility] ENDOSCOPY;  Service: Cardiovascular;  Laterality: N/A;   CARDIOVERSION N/A 05/25/2023   Procedure: CARDIOVERSION;  Surgeon: Mona Vinie BROCKS, MD;  Location: [facility] INVASIVE CV LAB;  Service: Cardiovascular;  Laterality: N/A;   COLONOSCOPY     DILATION AND CURETTAGE OF UTERUS     JOINT REPLACEMENT     TONSILLECTOMY  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   Procedure: TOTAL HIP ARTHROPLASTY ANTERIOR APPROACH;  Surgeon: Yvone Rush, MD;  Location: [facility] OR;  Service: Orthopedics;  Laterality: Left;     No  current facility-administered medications for this encounter.  Current Outpatient Medications:    acetaminophen  (TYLENOL ) 500 MG tablet, Take 2 tablets (1,000 mg total) by mouth every 8 (eight) hours as needed., Disp: , Rfl:    amoxicillin  (AMOXIL ) 500 MG capsule, Take 4 capsules (2,000 mg total) by mouth 1 hour prior to appointment. (Patient not taking: Reported on 02/08/2024), Disp: 4 capsule, Rfl: 0   amoxicillin -clavulanate (AUGMENTIN ) 875-125 MG tablet, Take 1 tablet by mouth 2 (two) times daily for 5 days., Disp: 10 tablet, Rfl: 0   apixaban  (ELIQUIS ) 5 MG TABS tablet, Take 1 tablet (5 mg total) by mouth 2 (two) times daily., Disp: 60 tablet, Rfl: 5   Brimonidine Tartrate (LUMIFY) 0.025 % SOLN, Place 1 drop into both eyes daily. Red eyes, Disp: , Rfl:    Carboxymeth-Glyc-Polysorb PF (REFRESH OPTIVE MEGA-3) 0.5-1-0.5 % SOLN, Place 1 drop into both eyes 4 (four) times daily as needed (dry eyes). Enhance with flax seed oil, Disp: , Rfl:    cetirizine  (ZYRTEC ) 10 MG tablet, Take 1 tablet (10 mg total) by mouth daily as needed for allergies., Disp: , Rfl:    diltiazem  (CARDIZEM ) 30 MG tablet, Take 1 tablet every 4 hours AS NEEDED for AFIB heart rate >100 as long as top BP >100. (Patient not taking: Reported on 02/08/2024), Disp: 30 tablet, Rfl: 1   estradiol  (ESTRACE ) 0.1 MG/GM vaginal cream, Apply to vaginal area 2 (two) times a week as directed., Disp: 42.5 g, Rfl: 1   hydrocortisone  cream 0.5 %, Apply 1 Application topically 2 (two) times daily as needed for itching., Disp: , Rfl:    ketotifen (ZADITOR) 0.025 % ophthalmic solution, Place 1 drop into both eyes 2 (two) times daily as needed (itchy eyes). In the fall, Disp: , Rfl:    mineral oil-hydrophilic petrolatum (AQUAPHOR) ointment, Apply 1 Application topically as needed for dry skin., Disp: , Rfl:    Multiple Vitamin (ONE-DAILY MULTI VITAMINS PO), Take 30 mLs by mouth daily. Ronal Dines liquid vitamin, Disp: , Rfl:    NON FORMULARY, Take 1,000  mg by mouth daily. Plant calcium  (Patient not taking: Reported on 02/08/2024), Disp: , Rfl:    OVER THE COUNTER MEDICATION, Place 1 application  into both eyes at bedtime. Pure & Clean, Disp: , Rfl:    rosuvastatin  (CRESTOR ) 10 MG tablet, Take 1 tablet (10 mg total) by mouth daily. (Patient taking differently: Take 20 mg by mouth daily.), Disp: 90 tablet, Rfl: 3   SUMAtriptan  (IMITREX ) 25 MG tablet, Take 1 tablet (25 mg total) by mouth daily as needed for migraine. May repeat in 2 hours if headache persists or recurs. Do not take more than 2 tablets/day., Disp: 10 tablet, Rfl: 0   triamcinolone cream (KENALOG) 0.1 %, Apply 1 Application topically 2 (two) times daily as needed (itching)., Disp: , Rfl:    Vitamins C E (CRANBERRY URINARY COMFORT PO), Take by mouth., Disp: , Rfl:     Physical Exam: Blood pressure (!) 147/69, pulse 65, temperature 97.9 F (36.6 C), temperature source Oral, resp. rate 16, height 5' 6.5 (1.689 m), weight 60.8 kg, SpO2 100%.    Affect appropriate Healthy:  appears stated age HEENT: normal Neck supple with no adenopathy JVP normal no bruits no thyromegaly Lungs clear with no wheezing and good diaphragmatic  motion Heart:  S1/S2 no murmur, no rub, gallop or click PMI normal Abdomen: benighn, BS positve, no tenderness, no AAA no bruit.  No HSM or HJR Distal pulses intact with no bruits No edema Neuro non-focal Skin warm and dry No muscular weakness   Labs:   Lab Results  Component Value Date   WBC 4.8 02/23/2024   HGB 14.5 02/23/2024   HCT 42.8 02/23/2024   MCV 98.8 02/23/2024   PLT 119 (L) 02/23/2024    Recent Labs  Lab 02/23/24 1705  NA 139  K 4.1  CL 105  CO2 26  BUN 21  CREATININE 0.78  CALCIUM  9.3  GLUCOSE 106*   No results found for: CKTOTAL, CKMB, CKMBINDEX, TROPONINI  Lab Results  Component Value Date   CHOL 147 02/04/2024   CHOL 153 08/11/2023   CHOL 144 12/18/2022   Lab Results  Component Value Date   HDL 62  02/04/2024   HDL 57 08/11/2023   HDL 64 12/18/2022   Lab Results  Component Value Date   LDLCALC 73 02/04/2024   LDLCALC 82 08/11/2023   LDLCALC 66 12/18/2022   Lab Results  Component Value Date   TRIG 55 02/04/2024   TRIG 67 08/11/2023   TRIG 59 12/18/2022   Lab Results  Component Value Date   CHOLHDL 2.4 02/04/2024   CHOLHDL 2.7 08/11/2023   CHOLHDL 2.3 12/18/2022   No results found for: LDLDIRECT    Radiology: No results found.  EKG: SR LAE ICRBBB/LAFB   ASSESSMENT AND PLAN:   Abnormal echo: in setting of some mild dyspnea. EF is normal she is maintaining NSR. She has been on eliquis  with only 2 misses doses in 2 weeks makes thrombus unlikely. She had a CTA using PV protocol in 2023 prior to ablation and she had moderate bi atrial enlargement but nothing unusual in her RA some myxoma less likely If CTA does not show us  RA well enough discussed having TEE in am or when we can get it scheduled as this is the most sensitive test to identify any RA abnormality PAF:  maintaining NSR continue eliquis   HLD:  continue statin    Signed: Maude Emmer 02/23/2024, 6:21 PM

## 2024-02-23 NOTE — ED Provider Notes (Signed)
 [place] EMERGENCY DEPARTMENT AT [facility] Provider Note  CSN: [id number] Arrival date & time: 02/23/24 1636  Chief Complaint(s) No chief complaint on file.  HPI Shelia [name] is a 83 y.o. female with PMH A-fib on Eliquis , HLD who presents emerged part for evaluation of an abnormal echocardiogram.  Patient states that she has had exertional dyspnea over the last 3 months and saw her cardiologist this morning who performed an echo.  There was concern for possible right atrial and right ventricular thrombus on the echo and patient was transferred to the emergency department from the office.  Denies chest pain, abdominal pain, nausea, vomiting, headache, fever or other systemic symptoms.   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   Meningioma (HCC) 08/03/2023   Persistent atrial fibrillation (HCC) 05/20/2023   Neutropenia, unspecified type (HCC) 12/23/2022   Alcohol dependence, 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   Thrombocytopenia (HCC) 09/13/2021   Leukopenia 09/13/2021   Elevated liver enzymes 09/13/2021   Hypercoagulable state due to persistent atrial fibrillation (HCC) 07/25/2021   Paroxysmal A-fib (HCC)    Primary osteoarthritis of left hip 09/29/2016   Urinary incontinence 06/25/2016   Lumbar radiculopathy 06/18/2016   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 Authorizing Provider  acetaminophen  (TYLENOL ) 500 MG tablet Take 2 tablets (1,000 mg total) by mouth every 8 (eight) hours as needed. 09/10/23   Medina-Vargas, Monina C, NP  amoxicillin  (AMOXIL ) 500 MG capsule Take 4 capsules (2,000 mg total) by mouth 1 hour prior to appointment. Patient not taking: Reported on 02/08/2024 03/18/23     amoxicillin -clavulanate (AUGMENTIN ) 875-125 MG tablet Take 1 tablet by mouth 2 (two) times daily for 5 days. 01/20/24     apixaban  (ELIQUIS ) 5 MG TABS tablet Take 1 tablet (5 mg total) by mouth 2 (two) times daily. 01/25/24   Camnitz, Will Gladis, MD  Brimonidine Tartrate (LUMIFY) 0.025 % SOLN Place 1 drop into both eyes daily. Red eyes    [provider]  Carboxymeth-Glyc-Polysorb PF (REFRESH OPTIVE MEGA-3) 0.5-1-0.5 % SOLN Place 1 drop into both eyes 4 (four) times daily as needed (dry eyes). Enhance with flax seed oil    [provider]  cetirizine  (ZYRTEC ) 10 MG tablet Take 1 tablet (10 mg total) by mouth daily as needed for allergies. 10/19/23   Medina-Vargas, Monina C, NP  diltiazem  (CARDIZEM ) 30 MG tablet Take 1 tablet every 4 hours AS NEEDED for AFIB heart rate >100 as long as top BP >  100. Patient not taking: Reported on 02/08/2024 05/27/23   Fenton, Clint R, PA  estradiol  (ESTRACE ) 0.1 MG/GM vaginal cream Apply to vaginal area 2 (two) times a week as directed. 02/08/24   Medina-Vargas, Monina C, NP  hydrocortisone  cream 0.5 % Apply 1 Application topically 2 (two) times daily as needed for itching.    [provider]  ketotifen (ZADITOR) 0.025 % ophthalmic solution Place 1 drop into both eyes 2 (two) times daily as needed (itchy eyes). In the fall    [provider]  mineral oil-hydrophilic petrolatum (AQUAPHOR) ointment Apply 1 Application topically as needed for dry skin.    [provider]  Multiple Vitamin (ONE-DAILY MULTI VITAMINS PO) Take 30 mLs by mouth daily. Ronal Dines liquid vitamin    [provider]  NON FORMULARY Take 1,000 mg by mouth daily. Plant calcium  Patient not taking: Reported on 02/08/2024    [provider]  OVER THE COUNTER MEDICATION Place 1 application  into both eyes at bedtime. Pure & Clean    [provider]  rosuvastatin  (CRESTOR ) 10 MG tablet Take 1 tablet (10 mg total) by mouth daily. Patient taking differently: Take 20 mg by mouth daily. 01/26/24   Pietro Redell RAMAN, MD  SUMAtriptan  (IMITREX ) 25 MG tablet Take 1 tablet (25 mg total) by mouth daily as needed for migraine. May repeat in 2 hours if headache persists or recurs. Do not take more than 2 tablets/day. 02/15/24   Medina-Vargas, Monina C, NP  triamcinolone cream (KENALOG) 0.1 % Apply 1 Application topically 2 (two) times daily as needed (itching).    [provider]  Vitamins C E (CRANBERRY URINARY COMFORT PO) Take by mouth.    [provider]                                                                                                                                    Past Surgical History Past Surgical History:  Procedure Laterality Date   ATRIAL FIBRILLATION ABLATION N/A 06/10/2022   Procedure: ATRIAL FIBRILLATION ABLATION;  Surgeon: Inocencio Soyla Lunger, MD;  Location: [facility] INVASIVE CV LAB;  Service: Cardiovascular;  Laterality: N/A;   CARDIOVERSION N/A 11/17/2019   Procedure: CARDIOVERSION;  Surgeon: Pietro Redell RAMAN, MD;  Location: [facility] ENDOSCOPY;  Service: Cardiovascular;  Laterality: N/A;   CARDIOVERSION N/A 04/16/2021   Procedure: CARDIOVERSION;  Surgeon: Raford Riggs, MD;  Location: [facility] ENDOSCOPY;  Service: Cardiovascular;  Laterality: N/A;   CARDIOVERSION N/A 08/15/2021   Procedure:  CARDIOVERSION;  Surgeon: Alvan Ronal BRAVO, MD;  Location: [facility] ENDOSCOPY;  Service: Cardiovascular;  Laterality: N/A;   CARDIOVERSION N/A 05/25/2023   Procedure: CARDIOVERSION;  Surgeon: Mona Vinie BROCKS, MD;  Location: [facility] INVASIVE CV LAB;  Service: Cardiovascular;  Laterality: N/A;   COLONOSCOPY     DILATION AND CURETTAGE OF UTERUS     JOINT REPLACEMENT     TONSILLECTOMY     TOTAL HIP  ARTHROPLASTY Left 09/29/2016   Procedure: TOTAL HIP ARTHROPLASTY ANTERIOR APPROACH;  Surgeon: Yvone Rush, MD;  Location: [facility] OR;  Service: Orthopedics;  Laterality: Left;   Family History Family History  Problem Relation Age of Onset   Hypertension Mother    Cancer Father        lung cancer   Diabetes Sister    Anxiety disorder Daughter    Breast cancer Neg Hx     Social History Social History   Tobacco Use   Smoking status: Former    Current packs/day: 2.00    Types: Cigarettes   Smokeless tobacco: Never   Tobacco comments:    Former smoker 07/08/22  Vaping Use   Vaping status: Never Used  Substance Use Topics   Alcohol use: Not Currently   Drug use: Never   Allergies Patient has no known allergies.  Review of Systems Review of Systems  Respiratory:  Positive for shortness of breath.     Physical Exam Vital Signs  I have reviewed the triage vital signs BP 124/82   Pulse (!) 50   Temp 98.2 F (36.8 C) (Oral)   Resp 14   Ht 5' 6.5 (1.689 m)   Wt 60.8 kg   SpO2 100%   BMI 21.30 kg/m   Physical Exam Vitals and nursing note reviewed.  Constitutional:      General: She is not in acute distress.    Appearance: She is well-developed.  HENT:     Head: Normocephalic and atraumatic.  Eyes:     Conjunctiva/sclera: Conjunctivae normal.  Cardiovascular:     Rate and Rhythm: Normal rate and regular rhythm.     Heart sounds: No murmur heard. Pulmonary:     Effort: Pulmonary effort is normal. No respiratory distress.     Breath sounds: Normal breath sounds.  Abdominal:      Palpations: Abdomen is soft.     Tenderness: There is no abdominal tenderness.  Musculoskeletal:        General: No swelling.     Cervical back: Neck supple.  Skin:    General: Skin is warm and dry.     Capillary Refill: Capillary refill takes less than 2 seconds.  Neurological:     Mental Status: She is alert.  Psychiatric:        Mood and Affect: Mood normal.     ED Results and Treatments Labs (all labs ordered are listed, but only abnormal results are displayed) Labs Reviewed  BASIC METABOLIC PANEL WITH GFR - Abnormal; Notable for the following components:      Result Value   Glucose, Bld 106 (*)    All other components within normal limits  CBC - Abnormal; Notable for the following components:   RDW 11.4 (*)    Platelets 119 (*)    All other components within normal limits  HEPARIN  LEVEL (UNFRACTIONATED)  CBC  APTT  TROPONIN I (HIGH SENSITIVITY)  TROPONIN I (HIGH SENSITIVITY)  Radiology CT Angio Chest PE W and/or Wo Contrast Result Date: 02/23/2024 CLINICAL DATA:  Pulmonary embolism suspected, high probability EXAM: CT ANGIOGRAPHY CHEST WITH CONTRAST TECHNIQUE: Multidetector CT imaging of the chest was performed using the standard protocol during bolus administration of intravenous contrast. Multiplanar CT image reconstructions and MIPs were obtained to evaluate the vascular anatomy. RADIATION DOSE REDUCTION: This exam was performed according to the departmental dose-optimization program which includes automated exposure control, adjustment of the mA and/or kV according to patient size and/or use of iterative reconstruction technique. CONTRAST:  75mL OMNIPAQUE  IOHEXOL  350 MG/ML SOLN COMPARISON:  CT heart morphology April 18, 2021. FINDINGS: Cardiovascular: Satisfactory opacification of the pulmonary arteries to the segmental level. No evidence of  pulmonary embolism. Cardiomegaly. Atherosclerotic calcifications of coronary arteries. No pericardial effusion. Mediastinum/Nodes: No enlarged mediastinal, hilar, or axillary lymph nodes. Thyroid  gland, trachea, and esophagus demonstrate no significant findings. Hiatal hernia. Lungs/Pleura: A few scattered pulmonary micro nodules (sub 3 mm) in right lower lobe subpleural (6/76, 83). No consolidation, significant emphysematous changes or honeycombing. Evaluation is suboptimal due to breathing motion artifact. There is mild bronchial and bronchiolar wall thickening. No pleural effusion. Mosaic ground-glass attenuation throughout both lungs likely physiologic (exam is acquired in expiratory phase.) Trachea and major airways are patent Upper Abdomen: Thickening of right adrenal gland incompletely assessed on current exam, similar to prior. Musculoskeletal: No suspicious osseous lesion. Review of the MIP images confirms the above findings. IMPRESSION: No suspicious finding to suggest pulmonary embolism. Few scattered pulmonary micro nodules in the right lung base, stable to prior. Follow-up according to guidelines below. Cardiomegaly. Fleischner Society 2017 Guidelines for Management of Incidentally Detected Solid Pulmonary Nodules in Adults Low-Risk Patient, Multiple: < 6 mm: No routine follow-up 6-8 mm: CT at 3-6 months, then consider CT at 18-24 months > 8 mm: CT at 3-6 months, then consider CT at 18-24 months High-Risk Patient, Multiple: <6 mm: Optional CT at 12 months 6-8 mm: CT at 3-6 months, then at 18-24 months > 8mm: CT at 3-6 months, then at 18-24 months Note -These recommendations do not apply to lung cancer screening, patients with immunosuppression, or patients with known primary cancer. https://urldefense.com/v3/[email or web address]://pubs.InstantPositions.nl.[id number]$ Electronically Signed   By:  Duwaine Severs M.D.   On: 02/23/2024 19:55   ECHOCARDIOGRAM COMPLETE Result Date: 02/23/2024    ECHOCARDIOGRAM REPORT   Patient Name:   Shelia [name] Date of Exam: 02/23/2024 Medical Rec #:  [id number]           Height:       67.0 in Accession #:    [id number]          Weight:       136.8 lb Date of Birth:  [birth year]           BSA:          1.721 m Patient Age:    82 years            BP:           92/58 mmHg Patient Gender: F                   HR:           52 bpm. Exam Location:  [address] Procedure: 2D Echo, Cardiac Doppler and Color Doppler (Both Spectral and Color            Flow Doppler were utilized during procedure). Indications:    Dyspnea on exertion [R06.09 (ICD-10-CM)]  History:  Patient has prior history of Echocardiogram examinations. Risk                 Factors:Former Smoker.  Sonographer:    Rosaline Fujisawa MHA, RDMS, RVT, RDCS Referring Phys: [id number] BRIAN S CRENSHAW  Sonographer Comments: Images reviewed by Doctor of the Day, Dr. Michele, and advised patient be transported to [facility] Emergency Department by EMS for evaluation. IMPRESSIONS  1. Left ventricular ejection fraction, by estimation, is 60 to 65%. The left ventricle has normal function. The left ventricle has no regional wall motion abnormalities. Left ventricular diastolic parameters are consistent with Grade I diastolic dysfunction (impaired relaxation).  2. Right ventricular systolic function is normal. The right ventricular size is normal. There is normal pulmonary artery systolic pressure. The estimated right ventricular systolic pressure is 19.1 mmHg.  3. Left atrial size was mildly dilated.  4. The sonographer identified a possible right atrial mass, however, this is only noted in the subcostal view and it is also noted the mass is not mobile and that the tricuspid valve excursion can be seen moving over the mass, suggesting that it is more likely to be echo artifact. Also, the same linear artifact is seen  extending into the left ventricle. This is consistent with comet tail artifact.. Right atrial size was moderately dilated.  5. The mitral valve is grossly normal. Trivial mitral valve regurgitation.  6. The aortic valve is tricuspid. Aortic valve regurgitation is trivial.  7. The pulmonic valve was abnormal.  8. The inferior vena cava is dilated in size with <50% respiratory variability, suggesting right atrial pressure of 15 mmHg. Comparison(s): No prior Echocardiogram. Changes from prior study are noted. 11/31/2023: LVEF 60-65%. FINDINGS  Left Ventricle: Left ventricular ejection fraction, by estimation, is 60 to 65%. The left ventricle has normal function. The left ventricle has no regional wall motion abnormalities. The left ventricular internal cavity size was normal in size. There is  no left ventricular hypertrophy. Left ventricular diastolic parameters are consistent with Grade I diastolic dysfunction (impaired relaxation). Indeterminate filling pressures. Right Ventricle: The right ventricular size is normal. No increase in right ventricular wall thickness. Right ventricular systolic function is normal. There is normal pulmonary artery systolic pressure. The tricuspid regurgitant velocity is 1.01 m/s, and  with an assumed right atrial pressure of 15 mmHg, the estimated right ventricular systolic pressure is 19.1 mmHg. Left Atrium: Left atrial size was mildly dilated. Right Atrium: The sonographer identified a possible right atrial mass, however, this is only noted in the subcostal view and it is also noted the mass is not mobile and that the tricuspid valve excursion can be seen moving over the mass, suggesting that it is more likely to be echo artifact. Also, the same linear artifact is seen extending into the left ventricle. This is consistent with comet tail artifact. Right atrial size was moderately dilated. Pericardium: There is no evidence of pericardial effusion. Mitral Valve: The mitral valve is  grossly normal. Trivial mitral valve regurgitation. Tricuspid Valve: The tricuspid valve is grossly normal. Tricuspid valve regurgitation is trivial. Aortic Valve: The aortic valve is tricuspid. Aortic valve regurgitation is trivial. Aortic regurgitation PHT measures 1263 msec. Aortic valve mean gradient measures 2.5 mmHg. Aortic valve peak gradient measures 4.4 mmHg. Aortic valve area, by VTI measures 1.47 cm. Pulmonic Valve: The pulmonic valve was abnormal. Pulmonic valve regurgitation is mild. Aorta: The aortic root and ascending aorta are structurally normal, with no evidence of dilitation. Venous: The inferior vena cava is  dilated in size with less than 50% respiratory variability, suggesting right atrial pressure of 15 mmHg. IAS/Shunts: No atrial level shunt detected by color flow Doppler.  LEFT VENTRICLE PLAX 2D LVIDd:         4.14 cm   Diastology LVIDs:         2.65 cm   LV e' medial:    10.70 cm/s LV PW:         0.96 cm   LV E/e' medial:  7.4 LV IVS:        0.69 cm   LV e' lateral:   14.00 cm/s LVOT diam:     1.61 cm   LV E/e' lateral: 5.6 LV SV:         36 LV SV Index:   21 LVOT Area:     2.04 cm  RIGHT VENTRICLE             IVC RV Basal diam:  3.71 cm     IVC diam: 2.93 cm RV Mid diam:    2.94 cm RV S prime:     14.50 cm/s TAPSE (M-mode): 2.4 cm LEFT ATRIUM             Index        RIGHT ATRIUM           Index LA diam:        3.40 cm 1.98 cm/m   RA Area:     14.70 cm LA Vol (A2C):   44.9 ml 26.09 ml/m  RA Volume:   31.60 ml  18.36 ml/m LA Vol (A4C):   44.3 ml 25.74 ml/m LA Biplane Vol: 46.8 ml 27.20 ml/m  AORTIC VALVE AV Area (Vmax):    1.50 cm AV Area (Vmean):   1.42 cm AV Area (VTI):     1.47 cm AV Vmax:           104.50 cm/s AV Vmean:          70.950 cm/s AV VTI:            0.246 m AV Peak Grad:      4.4 mmHg AV Mean Grad:      2.5 mmHg LVOT Vmax:         77.20 cm/s LVOT Vmean:        49.600 cm/s LVOT VTI:          0.178 m LVOT/AV VTI ratio: 0.72 AI PHT:            1263 msec  AORTA Ao Root  diam: 2.69 cm Ao Asc diam:  3.10 cm MITRAL VALVE                          TRICUSPID VALVE MV Area (PHT): 3.48 cm               TR Peak grad:   4.1 mmHg MV Decel Time: 218 msec               TR Vmax:        101.00 cm/s MR Peak grad:    91.0 mmHg MR Vmax:         477.00 cm/s          SHUNTS MR PISA Nyquist:             0.9 m/s  Systemic VTI:  0.18 m MR PISA:         1.16 cm  Systemic Diam: 1.61 cm MR PISA Radius:  0.43 cm MV E velocity: 79.10 cm/s MV A velocity: 47.20 cm/s MV E/A ratio:  1.68 Vinie Maxcy MD Electronically signed by Vinie Maxcy MD Signature Date/Time: 02/23/2024/7:43:17 PM    Final     Pertinent labs & imaging results that were available during my care of the patient were reviewed by me and considered in my medical decision making (see MDM for details).  Medications Ordered in ED Medications  heparin  ADULT infusion 100 units/mL (25000 units/250mL) (1,100 Units/hr Intravenous Restarted 02/23/24 2007)  acetaminophen  (TYLENOL ) tablet 650 mg (has no administration in time range)  ondansetron  (ZOFRAN ) injection 4 mg (has no administration in time range)  rosuvastatin  (CRESTOR ) tablet 20 mg (20 mg Oral Given 02/23/24 2121)  iohexol  (OMNIPAQUE ) 350 MG/ML injection 75 mL (75 mLs Intravenous Contrast Given 02/23/24 1936)                                                                                                                                     Procedures Procedures  (including critical care time)  Medical Decision Making / ED Course   This patient presents to the ED for concern of shortness of breath, abnormal echocardiogram, this involves an extensive number of treatment options, and is a complaint that carries with it a high risk of complications and morbidity.  The differential diagnosis includes atrial thrombus, ventricular thrombus, pulmonary embolism, hypercoagulable state, CHF  MDM: Patient seen emerged part for evaluation of an abnormal echocardiogram and  shortness of breath.  Physical exam is largely unremarkable.  Laboratory evaluation unremarkable including negative high-sensitivity troponin.  Spoke with the cardiologist on-call Dr. Delford and the previous cardiologist who sent the patient in Dr. Michele who is recommending CT PE to rule out pulmonary embolism and does start heparin  in the meantime.  Patient reassuringly does not have a PE on CT PE.  Patient started on heparin  and will be admitted to cardiology for TEE.  Patient admitted   Additional history obtained: -Additional history obtained from husband -External records from outside source obtained and reviewed including: Chart review including previous notes, labs, imaging, consultation notes   Lab Tests: -I ordered, reviewed, and interpreted labs.   The pertinent results include:   Labs Reviewed  BASIC METABOLIC PANEL WITH GFR - Abnormal; Notable for the following components:      Result Value   Glucose, Bld 106 (*)    All other components within normal limits  CBC - Abnormal; Notable for the following components:   RDW 11.4 (*)    Platelets 119 (*)    All other components within normal limits  HEPARIN  LEVEL (UNFRACTIONATED)  CBC  APTT  TROPONIN I (HIGH SENSITIVITY)  TROPONIN I (HIGH SENSITIVITY)     Imaging Studies ordered: I ordered imaging studies including CT PE I independently visualized and interpreted imaging. I agree with the radiologist interpretation   Medicines ordered and  prescription drug management: Meds ordered this encounter  Medications   heparin  ADULT infusion 100 units/mL (25000 units/250mL)   iohexol  (OMNIPAQUE ) 350 MG/ML injection 75 mL   acetaminophen  (TYLENOL ) tablet 650 mg   ondansetron  (ZOFRAN ) injection 4 mg   rosuvastatin  (CRESTOR ) tablet 20 mg    -I have reviewed the patients home medicines and have made adjustments as needed  Critical interventions none  Consultations Obtained: I requested consultation with the cardiologist  on-call,  and discussed lab and imaging findings as well as pertinent plan - they recommend: PE study, admission   Cardiac Monitoring: The patient was maintained on a cardiac monitor.  I personally viewed and interpreted the cardiac monitored which showed an underlying rhythm of: NSR  Social Determinants of Health:  Factors impacting patients care include: none   Reevaluation: After the interventions noted above, I reevaluated the patient and found that they have :stayed the same  Co morbidities that complicate the patient evaluation  Past Medical History:  Diagnosis Date   Anxiety    Arthritis    left knee; left hip; lower back; mild to moderate in right hip (09/30/2016)   Atrial fibrillation (HCC)    Chronic lower back pain    Gastritis    Heart palpitations    History of bone density study 2018   History of mammogram 2021   History of MRI 2018   History of Papanicolaou smear of cervix    Hypercholesteremia    Osteopenia    Seasonal allergies       Dispostion: I considered admission for this patient, and patient require hospital admission for abnormal echocardiogram and need for TEE     Final Clinical Impression(s) / ED Diagnoses Final diagnoses:  Abnormal ultrasound     @PCDICTATION @    Albertina Dixon, MD 02/23/24 2230

## 2024-02-23 NOTE — Progress Notes (Signed)
 2D echocardiogram completed at [facility].  Echo exam with possible critical findings of thrombus in the right chambers, along with soft BP and dyspnea on exertion- spoke with Dr. Michele (DOD), and was advised to call EMS for transport to [facility] ED for further evaluation. Patient stable upon [facility] arrival [facility], was transported to [facility] ED.  02/23/2024 4:19 PM Rosaline EDISON MHA, RVT, RDCS, RDMS

## 2024-02-23 NOTE — ED Notes (Signed)
 Per Delford MD patient could eat. Water and sandwich bag given to her.   NPO at midnight

## 2024-02-23 NOTE — Progress Notes (Signed)
 Discussed case with Dr Kate. We both felt that echo was probably artifact May be difficult to get TEE done tomorrow due to schedule Felt we could also solve this issue by doing cardiac MRI tomorrow. Will discuss with patient in am.  Maude Emmer MD [facility] [address]

## 2024-02-24 ENCOUNTER — Observation Stay (HOSPITAL_BASED_OUTPATIENT_CLINIC_OR_DEPARTMENT_OTHER)

## 2024-02-24 ENCOUNTER — Telehealth: Payer: Self-pay | Admitting: Cardiology

## 2024-02-24 DIAGNOSIS — R9430 Abnormal result of cardiovascular function study, unspecified: Secondary | ICD-10-CM | POA: Diagnosis not present

## 2024-02-24 DIAGNOSIS — E785 Hyperlipidemia, unspecified: Secondary | ICD-10-CM | POA: Insufficient documentation

## 2024-02-24 DIAGNOSIS — R931 Abnormal findings on diagnostic imaging of heart and coronary circulation: Secondary | ICD-10-CM | POA: Diagnosis not present

## 2024-02-24 DIAGNOSIS — I480 Paroxysmal atrial fibrillation: Secondary | ICD-10-CM | POA: Diagnosis not present

## 2024-02-24 DIAGNOSIS — R0600 Dyspnea, unspecified: Secondary | ICD-10-CM | POA: Insufficient documentation

## 2024-02-24 DIAGNOSIS — R0609 Other forms of dyspnea: Secondary | ICD-10-CM | POA: Diagnosis not present

## 2024-02-24 LAB — CBC
HCT: 40.8 % (ref 36.0–46.0)
Hemoglobin: 13.7 g/dL (ref 12.0–15.0)
MCH: 33.3 pg (ref 26.0–34.0)
MCHC: 33.6 g/dL (ref 30.0–36.0)
MCV: 99 fL (ref 80.0–100.0)
Platelets: 104 K/uL — ABNORMAL LOW (ref 150–400)
RBC: 4.12 MIL/uL (ref 3.87–5.11)
RDW: 11.4 % — ABNORMAL LOW (ref 11.5–15.5)
WBC: 5 K/uL (ref 4.0–10.5)
nRBC: 0 % (ref 0.0–0.2)

## 2024-02-24 LAB — HEPARIN LEVEL (UNFRACTIONATED): Heparin Unfractionated: >1.1 [IU]/mL — ABNORMAL HIGH (ref 0.30–0.70)

## 2024-02-24 LAB — APTT: aPTT: >200 s (ref 24–36)

## 2024-02-24 MED ORDER — SODIUM CHLORIDE 0.9 % IV SOLN: INTRAVENOUS | Status: DC

## 2024-02-24 MED ORDER — APIXABAN 5 MG PO TABS
5.0000 mg | ORAL_TABLET | Freq: Two times a day (BID) | ORAL | Status: DC
  Administered 2024-02-24: 5 mg via ORAL
  Filled 2024-02-24: qty 1

## 2024-02-24 MED ORDER — HEPARIN (PORCINE) 25000 UT/250ML-% IV SOLN: 950.0000 [IU]/h | INTRAVENOUS | Status: DC

## 2024-02-24 MED ORDER — GADOBUTROL 1 MMOL/ML IV SOLN
10.0000 mL | Freq: Once | INTRAVENOUS | Status: AC | PRN
  Administered 2024-02-24: 10 mL via INTRAVENOUS

## 2024-02-24 NOTE — Discharge Summary (Signed)
 Discharge Summary   Patient ID: Shelia [name] MRN: [id number]; DOB: 1941-03-06  Admit date: 02/23/2024 Discharge date: 02/24/2024  PCP:  Phyllis Jereld BROCKS, NP   [facility] Providers Cardiologist:  Redell Shallow, MD  Electrophysiologist:  Soyla Gladis Norton, MD    Discharge Diagnoses  Active Problems:   Persistent atrial fibrillation [facility])   Dyspnea   Hyperlipidemia  Diagnostic Studies/Procedures   Echocardiogram, 02/23/2024 Left ventricular ejection fraction, by estimation, is 60 to 65% . The left ventricle has normal function. The left ventricle has no regional wall motion abnormalities. Left ventricular diastolic parameters are consistent with Grade I diastolic dysfunction ( impaired relaxation)  Right ventricular systolic function is normal. The right ventricular size is normal. There is normal pulmonary artery systolic pressure. The estimated right ventricular systolic pressure is 19. 1 mmHg.  Left atrial size was mildly dilated.  The sonographer identified a possible right atrial mass, however, this is only noted in the subcostal view and it is also noted the  mass is not mobile and that the tricuspid valve excursion can be seen moving over the  mass , suggesting that it is more likely to be echo artifact. Also, the same linear artifact is seen extending into the left ventricle. This is consistent with comet tail artifact. . Right atrial size was moderately dilated.  The mitral valve is grossly normal. Trivial mitral valve regurgitation.  The aortic valve is tricuspid. Aortic valve regurgitation is trivial.  The pulmonic valve was abnormal.  The inferior vena cava is dilated in size with < 50% respiratory variability, suggesting right atrial pressure of 15 mmHg.  Cardiac MRI, 02/24/2024 No right atrial mass is detected. Normal biventricular chamber size and function. LVEF 67%, RVEF 58%. Biatrial dilation. _____________   History of Present Illness    Shelia [name] is a 83 y.o. female with past medical history of paroxysmal atrial fibrillation, CAD noted on previous CTA, hyperlipidemia, dyspnea.  She was sent to the emergency department directly from our office after having an echocardiogram done with concerns for possible right atrial thrombus.  Hospital Course    She came to the ED directly from her outpatient office after undergoing an echocardiogram with concerns for possible right atrial thrombus.  While in the hospital, she underwent a cardiac MRI to confirm/deny if there was a right atrial thrombus.Cardiac MRI showed no right atrial mass, normal biventricular chamber size/function, LVEF 67% and RVEF 58%, biatrial enlargement.   There is discussion of possible outpatient monitor at discharge, patient defers at this time and would like to follow-up in the office prior to making another decision.  Reviewed the cardiac MRI results with MD and determined patient was stable for discharge with close follow-up appointment in around 2 weeks. She reports that she has been taking her Crestor  as 20 mg not 10 mg, when asked if she needed a new script she defers.     Did the patient have an acute coronary syndrome (MI, NSTEMI, STEMI, etc) this admission?:  No                               Did the patient have a percutaneous coronary intervention (stent / angioplasty)?:  No.     _____________  Discharge Vitals Blood pressure 122/69, pulse (!) 45, temperature 97.7 F (36.5 C), resp. rate 19, height 5' 6.5 (1.689 m), weight 60.8 kg, SpO2 100%.  Filed Weights   02/23/24 1643  Weight: 60.8 kg   Labs & Radiologic Studies  CBC Recent Labs    02/23/24 1705 02/24/24 0542  WBC 4.8 5.0  HGB 14.5 13.7  HCT 42.8 40.8  MCV 98.8 99.0  PLT 119* 104*   Basic Metabolic Panel Recent Labs    92/70/74 1705  NA 139  K 4.1  CL 105  CO2 26  GLUCOSE 106*  BUN 21  CREATININE 0.78  CALCIUM  9.3   Liver Function Tests No results for input(s):  AST, ALT, ALKPHOS, BILITOT, PROT, ALBUMIN in the last 72 hours. No results for input(s): LIPASE, AMYLASE in the last 72 hours. High Sensitivity Troponin:   Recent Labs  Lab 02/23/24 1705 02/23/24 1905  TROPONINIHS 4 5    No results for input(s): TRNPT in the last 720 hours.  BNP Invalid input(s): POCBNP No results for input(s): PROBNP in the last 72 hours.  No results for input(s): BNP in the last 72 hours.  D-Dimer No results for input(s): DDIMER in the last 72 hours. Hemoglobin A1C No results for input(s): HGBA1C in the last 72 hours. Fasting Lipid Panel No results for input(s): CHOL, HDL, LDLCALC, TRIG, CHOLHDL, LDLDIRECT in the last 72 hours. No results found for: LIPOA  Thyroid  Function Tests No results for input(s): TSH, T4TOTAL, T3FREE, THYROIDAB in the last 72 hours.  Invalid input(s): FREET3 _____________  MR CARDIAC MORPHOLOGY W WO CONTRAST Result Date: 02/24/2024 CLINICAL DATA:  Cardiac mass/tumor suspected COMPARISON: Echo 02/23/2024 and 08/27/2021 EXAM: MR CARDIA MORPHOLOGY WITHOUT AND WITH CONTRAST; MR CARDIAC VELOCITY FLOW MAPPING TECHNIQUE: The patient was scanned on a 1.5 Tesla Siemens magnet. A dedicated cardiac coil was used. Functional imaging was done using TrueFisp sequences. 2,3, and 4 chamber views were done to assess for RWMA's. Modified Simpson's rule using a short axis stack was used to calculate an ejection fraction on a dedicated work [employer]. The patient received 10mL GADAVIST  GADOBUTROL  1 MMOL/ML IV SOLN. After 10 minutes inversion recovery sequences were used to assess for infiltration and scar tissue. Phase contrast velocity encoded images obtained x 2. This examination is tailored for evaluation cardiac anatomy and function and provides very limited assessment of noncardiac structures, which are accordingly not evaluated during interpretation. If there is clinical concern for  extracardiac pathology, further evaluation with CT imaging should be considered. FINDINGS: LEFT VENTRICLE: Left ventricular chamber size: Normal. Left ventricular wall thickness: Normal. Maximal wall thickness 8 mm. Left ventricular systolic function: Normal LVEF = 67% There are no regional wall motion abnormalities. No myocardial edema, T2 = 54 msec Normal first pass perfusion. There is no post contrast delayed myocardial enhancement. Normal T1 myocardial nulling kinetics suggest against a diagnosis of cardiac amyloidosis. ECV = 30%, non specific elevation. Native T1 values are normal range, <1000. RIGHT VENTRICLE: Normal right ventricular chamber size. Normal right ventricular wall thickness. Normal right ventricular systolic function. RVEF = 58% There are no regional wall motion abnormalities. No post contrast delayed myocardial enhancement. ATRIA: Left atrium: Moderately dilated Right atrium: Moderately dilated. No mass detected on SSFP or delayed enhancement images. PERICARDIUM: Normal pericardium.  Trivial pericardial effusion. OTHER: No significant extracardiac findings. MEASUREMENTS: Qp/Qs: 1.2 VALVES: Tricuspid aortic valve. Aortic valve regurgitation: Mild, regurgitant fraction 5% Pulmonary valve regurgitation: Trace, regurgitant fraction <1% Mitral valve regurgitation: Mild, regurgitant fraction 9% Tricuspid valve regurgitation: Mild, regurgitant fraction 9% Left ventricle: LV female LV EF: 67 % (Normal 52-79%) Absolute volumes: LV EDV: 98mL (Normal 78-167 mL) LV ESV: 33mL (Normal 21-64 mL) LV  SV: 66mL (Normal 52-114 mL) CO: 3.2L/min (Normal 2.7-6.3 L/min) Indexed volumes: CI: 1.88L/min/sq-m (Normal 1.9-3.9 L/min/sq-m) LV EDV: 20mL/sq-m (Normal 50-96 mL/sq-m) LV ESV: 12mL/sq-m (Normal 10-40 mL/sq-m) LV SV: 67mL/sq-m (Normal 33-64 mL/sq-m) Right ventricle: RV female RV EF: 58% (normal 52-80%) Absolute volumes: RV EDV: (Normal 79-175 mL) RV ESV: 58mL (Normal 13-75 mL) RV SV: 78mL (normal 56-110 mL)  CO: 3.78L/min (Normal 2.7-6 L/min) Indexed volumes: CI: 2.24L/min/sq-m (Normal 1.8-3.8 L/min/sq-m) RV EDV: 47mL/sq-m (Normal 51-97 mL/sq-m) RV ESV: 48mL/sq-m (Normal 9-42 mL/sq-m) RV SV: 60mL/sq-m (Normal 35-61 mL/sq-m) IMPRESSION: 1.  No right atrial mass is detected. 2. Normal biventricular chamber size and function. LVEF 67%, RVEF 58%. 3.  Biatrial dilation. Electronically Signed   By: Soyla Merck M.D.   On: 02/24/2024 13:16   MR CARDIAC VELOCITY FLOW MAP Result Date: 02/24/2024 CLINICAL DATA:  Cardiac mass/tumor suspected COMPARISON: Echo 02/23/2024 and 08/27/2021 EXAM: MR CARDIA MORPHOLOGY WITHOUT AND WITH CONTRAST; MR CARDIAC VELOCITY FLOW MAPPING TECHNIQUE: The patient was scanned on a 1.5 Tesla Siemens magnet. A dedicated cardiac coil was used. Functional imaging was done using TrueFisp sequences. 2,3, and 4 chamber views were done to assess for RWMA's. Modified Simpson's rule using a short axis stack was used to calculate an ejection fraction on a dedicated work [employer] officer, [employer]. The patient received 10mL GADAVIST  GADOBUTROL  1 MMOL/ML IV SOLN. After 10 minutes inversion recovery sequences were used to assess for infiltration and scar tissue. Phase contrast velocity encoded images obtained x 2. This examination is tailored for evaluation cardiac anatomy and function and provides very limited assessment of noncardiac structures, which are accordingly not evaluated during interpretation. If there is clinical concern for extracardiac pathology, further evaluation with CT imaging should be considered. FINDINGS: LEFT VENTRICLE: Left ventricular chamber size: Normal. Left ventricular wall thickness: Normal. Maximal wall thickness 8 mm. Left ventricular systolic function: Normal LVEF = 67% There are no regional wall motion abnormalities. No myocardial edema, T2 = 54 msec Normal first pass perfusion. There is no post contrast delayed myocardial enhancement. Normal T1 myocardial nulling kinetics  suggest against a diagnosis of cardiac amyloidosis. ECV = 30%, non specific elevation. Native T1 values are normal range, <1000. RIGHT VENTRICLE: Normal right ventricular chamber size. Normal right ventricular wall thickness. Normal right ventricular systolic function. RVEF = 58% There are no regional wall motion abnormalities. No post contrast delayed myocardial enhancement. ATRIA: Left atrium: Moderately dilated Right atrium: Moderately dilated. No mass detected on SSFP or delayed enhancement images. PERICARDIUM: Normal pericardium.  Trivial pericardial effusion. OTHER: No significant extracardiac findings. MEASUREMENTS: Qp/Qs: 1.2 VALVES: Tricuspid aortic valve. Aortic valve regurgitation: Mild, regurgitant fraction 5% Pulmonary valve regurgitation: Trace, regurgitant fraction <1% Mitral valve regurgitation: Mild, regurgitant fraction 9% Tricuspid valve regurgitation: Mild, regurgitant fraction 9% Left ventricle: LV female LV EF: 67 % (Normal 52-79%) Absolute volumes: LV EDV: 98mL (Normal 78-167 mL) LV ESV: 33mL (Normal 21-64 mL) LV SV: 66mL (Normal 52-114 mL) CO: 3.2L/min (Normal 2.7-6.3 L/min) Indexed volumes: CI: 1.88L/min/sq-m (Normal 1.9-3.9 L/min/sq-m) LV EDV: 2mL/sq-m (Normal 50-96 mL/sq-m) LV ESV: 53mL/sq-m (Normal 10-40 mL/sq-m) LV SV: 57mL/sq-m (Normal 33-64 mL/sq-m) Right ventricle: RV female RV EF: 58% (normal 52-80%) Absolute volumes: RV EDV: (Normal 79-175 mL) RV ESV: 58mL (Normal 13-75 mL) RV SV: 78mL (normal 56-110 mL) CO: 3.78L/min (Normal 2.7-6 L/min) Indexed volumes: CI: 2.24L/min/sq-m (Normal 1.8-3.8 L/min/sq-m) RV EDV: 61mL/sq-m (Normal 51-97 mL/sq-m) RV ESV: 38mL/sq-m (Normal 9-42 mL/sq-m) RV SV: 12mL/sq-m (Normal 35-61 mL/sq-m) IMPRESSION: 1.  No right atrial  mass is detected. 2. Normal biventricular chamber size and function. LVEF 67%, RVEF 58%. 3.  Biatrial dilation. Electronically Signed   By: Soyla Merck M.D.   On: 02/24/2024 13:16   MR CARDIAC VELOCITY FLOW MAP Result  Date: 02/24/2024 CLINICAL DATA:  Cardiac mass/tumor suspected COMPARISON: Echo 02/23/2024 and 08/27/2021 EXAM: MR CARDIA MORPHOLOGY WITHOUT AND WITH CONTRAST; MR CARDIAC VELOCITY FLOW MAPPING TECHNIQUE: The patient was scanned on a 1.5 Tesla Siemens magnet. A dedicated cardiac coil was used. Functional imaging was done using TrueFisp sequences. 2,3, and 4 chamber views were done to assess for RWMA's. Modified Simpson's rule using a short axis stack was used to calculate an ejection fraction on a dedicated work [employer] officer, [employer]. The patient received 10mL GADAVIST  GADOBUTROL  1 MMOL/ML IV SOLN. After 10 minutes inversion recovery sequences were used to assess for infiltration and scar tissue. Phase contrast velocity encoded images obtained x 2. This examination is tailored for evaluation cardiac anatomy and function and provides very limited assessment of noncardiac structures, which are accordingly not evaluated during interpretation. If there is clinical concern for extracardiac pathology, further evaluation with CT imaging should be considered. FINDINGS: LEFT VENTRICLE: Left ventricular chamber size: Normal. Left ventricular wall thickness: Normal. Maximal wall thickness 8 mm. Left ventricular systolic function: Normal LVEF = 67% There are no regional wall motion abnormalities. No myocardial edema, T2 = 54 msec Normal first pass perfusion. There is no post contrast delayed myocardial enhancement. Normal T1 myocardial nulling kinetics suggest against a diagnosis of cardiac amyloidosis. ECV = 30%, non specific elevation. Native T1 values are normal range, <1000. RIGHT VENTRICLE: Normal right ventricular chamber size. Normal right ventricular wall thickness. Normal right ventricular systolic function. RVEF = 58% There are no regional wall motion abnormalities. No post contrast delayed myocardial enhancement. ATRIA: Left atrium: Moderately dilated Right atrium: Moderately dilated. No mass detected on SSFP or  delayed enhancement images. PERICARDIUM: Normal pericardium.  Trivial pericardial effusion. OTHER: No significant extracardiac findings. MEASUREMENTS: Qp/Qs: 1.2 VALVES: Tricuspid aortic valve. Aortic valve regurgitation: Mild, regurgitant fraction 5% Pulmonary valve regurgitation: Trace, regurgitant fraction <1% Mitral valve regurgitation: Mild, regurgitant fraction 9% Tricuspid valve regurgitation: Mild, regurgitant fraction 9% Left ventricle: LV female LV EF: 67 % (Normal 52-79%) Absolute volumes: LV EDV: 98mL (Normal 78-167 mL) LV ESV: 33mL (Normal 21-64 mL) LV SV: 66mL (Normal 52-114 mL) CO: 3.2L/min (Normal 2.7-6.3 L/min) Indexed volumes: CI: 1.88L/min/sq-m (Normal 1.9-3.9 L/min/sq-m) LV EDV: 79mL/sq-m (Normal 50-96 mL/sq-m) LV ESV: 25mL/sq-m (Normal 10-40 mL/sq-m) LV SV: 26mL/sq-m (Normal 33-64 mL/sq-m) Right ventricle: RV female RV EF: 58% (normal 52-80%) Absolute volumes: RV EDV: (Normal 79-175 mL) RV ESV: 58mL (Normal 13-75 mL) RV SV: 78mL (normal 56-110 mL) CO: 3.78L/min (Normal 2.7-6 L/min) Indexed volumes: CI: 2.24L/min/sq-m (Normal 1.8-3.8 L/min/sq-m) RV EDV: 64mL/sq-m (Normal 51-97 mL/sq-m) RV ESV: 31mL/sq-m (Normal 9-42 mL/sq-m) RV SV: 33mL/sq-m (Normal 35-61 mL/sq-m) IMPRESSION: 1.  No right atrial mass is detected. 2. Normal biventricular chamber size and function. LVEF 67%, RVEF 58%. 3.  Biatrial dilation. Electronically Signed   By: Soyla Merck M.D.   On: 02/24/2024 13:16   CT Angio Chest PE W and/or Wo Contrast Result Date: 02/23/2024 CLINICAL DATA:  Pulmonary embolism suspected, high probability EXAM: CT ANGIOGRAPHY CHEST WITH CONTRAST TECHNIQUE: Multidetector CT imaging of the chest was performed using the standard protocol during bolus administration of intravenous contrast. Multiplanar CT image reconstructions and MIPs were obtained to evaluate the vascular anatomy. RADIATION DOSE REDUCTION: This exam was performed according to  the departmental dose-optimization program which  includes automated exposure control, adjustment of the mA and/or kV according to patient size and/or use of iterative reconstruction technique. CONTRAST:  75mL OMNIPAQUE  IOHEXOL  350 MG/ML SOLN COMPARISON:  CT heart morphology April 18, 2021. FINDINGS: Cardiovascular: Satisfactory opacification of the pulmonary arteries to the segmental level. No evidence of pulmonary embolism. Cardiomegaly. Atherosclerotic calcifications of coronary arteries. No pericardial effusion. Mediastinum/Nodes: No enlarged mediastinal, hilar, or axillary lymph nodes. Thyroid  gland, trachea, and esophagus demonstrate no significant findings. Hiatal hernia. Lungs/Pleura: A few scattered pulmonary micro nodules (sub 3 mm) in right lower lobe subpleural (6/76, 83). No consolidation, significant emphysematous changes or honeycombing. Evaluation is suboptimal due to breathing motion artifact. There is mild bronchial and bronchiolar wall thickening. No pleural effusion. Mosaic ground-glass attenuation throughout both lungs likely physiologic (exam is acquired in expiratory phase.) Trachea and major airways are patent Upper Abdomen: Thickening of right adrenal gland incompletely assessed on current exam, similar to prior. Musculoskeletal: No suspicious osseous lesion. Review of the MIP images confirms the above findings. IMPRESSION: No suspicious finding to suggest pulmonary embolism. Few scattered pulmonary micro nodules in the right lung base, stable to prior. Follow-up according to guidelines below. Cardiomegaly. Fleischner Society 2017 Guidelines for Management of Incidentally Detected Solid Pulmonary Nodules in Adults Low-Risk Patient, Multiple: < 6 mm: No routine follow-up 6-8 mm: CT at 3-6 months, then consider CT at 18-24 months > 8 mm: CT at 3-6 months, then consider CT at 18-24 months High-Risk Patient, Multiple: <6 mm: Optional CT at 12 months 6-8 mm: CT at 3-6 months, then at 18-24 months > 8mm: CT at 3-6 months, then at 18-24 months  Note -These recommendations do not apply to lung cancer screening, patients with immunosuppression, or patients with known primary cancer. https://urldefense.com/v3/[email or web address]://pubs.InstantPositions.nl.[id number]$ Electronically Signed   By: Duwaine Severs M.D.   On: 02/23/2024 19:55   ECHOCARDIOGRAM COMPLETE Result Date: 02/23/2024    ECHOCARDIOGRAM REPORT   Patient Name:   Shelia [name] Date of Exam: 02/23/2024 Medical Rec #:  [id number]           Height:       67.0 in Accession #:    [id number]          Weight:       136.8 lb Date of Birth:  09-22-1940           BSA:          1.721 m Patient Age:    82 years            BP:           92/58 mmHg Patient Gender: F                   HR:           52 bpm. Exam Location:  [address] Procedure: 2D Echo, Cardiac Doppler and Color Doppler (Both Spectral and Color            Flow Doppler were utilized during procedure). Indications:    Dyspnea on exertion [R06.09 (ICD-10-CM)]  History:        Patient has prior history of Echocardiogram examinations. Risk                 Factors:Former Smoker.  Sonographer:    Rosaline Fujisawa MHA, RDMS, RVT, RDCS Referring Phys: [id number] BRIAN S CRENSHAW  Sonographer Comments: Images reviewed by Doctor of the Day, Dr. Michele, and advised patient be transported to [facility] by EMS for evaluation. IMPRESSIONS  1. Left ventricular ejection fraction, by estimation, is 60 to 65%. The left ventricle has normal function. The left ventricle has no regional wall motion abnormalities. Left ventricular diastolic parameters are consistent with Grade I diastolic dysfunction (impaired relaxation).  2. Right ventricular systolic function is normal. The right ventricular size is normal. There is normal pulmonary artery systolic pressure. The estimated right ventricular systolic pressure is 19.1 mmHg.  3. Left  atrial size was mildly dilated.  4. The sonographer identified a possible right atrial mass, however, this is only noted in the subcostal view and it is also noted the mass is not mobile and that the tricuspid valve excursion can be seen moving over the mass, suggesting that it is more likely to be echo artifact. Also, the same linear artifact is seen extending into the left ventricle. This is consistent with comet tail artifact.. Right atrial size was moderately dilated.  5. The mitral valve is grossly normal. Trivial mitral valve regurgitation.  6. The aortic valve is tricuspid. Aortic valve regurgitation is trivial.  7. The pulmonic valve was abnormal.  8. The inferior vena cava is dilated in size with <50% respiratory variability, suggesting right atrial pressure of 15 mmHg. Comparison(s): No prior Echocardiogram. Changes from prior study are noted. 11/31/2023: LVEF 60-65%. FINDINGS  Left Ventricle: Left ventricular ejection fraction, by estimation, is 60 to 65%. The left ventricle has normal function. The left ventricle has no regional wall motion abnormalities. The left ventricular internal cavity size was normal in size. There is  no left ventricular hypertrophy. Left ventricular diastolic parameters are consistent with Grade I diastolic dysfunction (impaired relaxation). Indeterminate filling pressures. Right Ventricle: The right ventricular size is normal. No increase in right ventricular wall thickness. Right ventricular systolic function is normal. There is normal pulmonary artery systolic pressure. The tricuspid regurgitant velocity is 1.01 m/s, and  with an assumed right atrial pressure of 15 mmHg, the estimated right ventricular systolic pressure is 19.1 mmHg. Left Atrium: Left atrial size was mildly dilated. Right Atrium: The sonographer identified a possible right atrial mass, however, this is only noted in the subcostal view and it is also noted the mass is not mobile and that the tricuspid  valve excursion can be seen moving over the mass, suggesting that it is more likely to be echo artifact. Also, the same linear artifact is seen extending into the left ventricle. This is consistent with comet tail artifact. Right atrial size was moderately dilated. Pericardium: There is no evidence of pericardial effusion. Mitral Valve: The mitral valve is grossly normal. Trivial mitral valve regurgitation. Tricuspid Valve: The tricuspid valve is grossly normal. Tricuspid valve regurgitation is trivial. Aortic Valve: The aortic valve is tricuspid. Aortic valve regurgitation is trivial. Aortic regurgitation PHT measures 1263 msec. Aortic valve mean gradient measures 2.5 mmHg. Aortic valve peak gradient measures 4.4 mmHg. Aortic valve area, by VTI measures 1.47 cm. Pulmonic Valve: The pulmonic valve was abnormal. Pulmonic valve regurgitation is mild. Aorta: The aortic root and ascending aorta are structurally normal, with no evidence of dilitation. Venous: The inferior vena cava is dilated in size with less than 50% respiratory variability, suggesting right atrial pressure of 15 mmHg. IAS/Shunts: No atrial level shunt detected by color flow Doppler.  LEFT VENTRICLE PLAX 2D LVIDd:         4.14 cm   Diastology LVIDs:         2.65 cm   LV e' medial:  10.70 cm/s LV PW:         0.96 cm   LV E/e' medial:  7.4 LV IVS:        0.69 cm   LV e' lateral:   14.00 cm/s LVOT diam:     1.61 cm   LV E/e' lateral: 5.6 LV SV:         36 LV SV Index:   21 LVOT Area:     2.04 cm  RIGHT VENTRICLE             IVC RV Basal diam:  3.71 cm     IVC diam: 2.93 cm RV Mid diam:    2.94 cm RV S prime:     14.50 cm/s TAPSE (M-mode): 2.4 cm LEFT ATRIUM             Index        RIGHT ATRIUM           Index LA diam:        3.40 cm 1.98 cm/m   RA Area:     14.70 cm LA Vol (A2C):   44.9 ml 26.09 ml/m  RA Volume:   31.60 ml  18.36 ml/m LA Vol (A4C):   44.3 ml 25.74 ml/m LA Biplane Vol: 46.8 ml 27.20 ml/m  AORTIC VALVE AV Area (Vmax):    1.50  cm AV Area (Vmean):   1.42 cm AV Area (VTI):     1.47 cm AV Vmax:           104.50 cm/s AV Vmean:          70.950 cm/s AV VTI:            0.246 m AV Peak Grad:      4.4 mmHg AV Mean Grad:      2.5 mmHg LVOT Vmax:         77.20 cm/s LVOT Vmean:        49.600 cm/s LVOT VTI:          0.178 m LVOT/AV VTI ratio: 0.72 AI PHT:            1263 msec  AORTA Ao Root diam: 2.69 cm Ao Asc diam:  3.10 cm MITRAL VALVE                          TRICUSPID VALVE MV Area (PHT): 3.48 cm               TR Peak grad:   4.1 mmHg MV Decel Time: 218 msec               TR Vmax:        101.00 cm/s MR Peak grad:    91.0 mmHg MR Vmax:         477.00 cm/s          SHUNTS MR PISA Nyquist:             0.9 m/s  Systemic VTI:  0.18 m MR PISA:         1.16 cm             Systemic Diam: 1.61 cm MR PISA Radius:  0.43 cm MV E velocity: 79.10 cm/s MV A velocity: 47.20 cm/s MV E/A ratio:  1.68 Vinie Maxcy MD Electronically signed by Vinie Maxcy MD Signature Date/Time: 02/23/2024/7:43:17 PM    Final    Disposition Pt is being discharged home today in good condition per  MD.  Follow-up Plans & Appointments  Discharge Instructions     Discharge instructions   Complete by: As directed    Follow up appointment made 8/13 at 9:15am with Aline Door, PA-C      Future Appointments  Date Time Provider Department Center  03/09/2024  9:15 AM Goodrich, Callie E, PA-C [facility]  05/09/2024  9:40 AM Medina-Vargas, Monina C, NP [facility] None  05/16/2024  9:00 AM Medina-Vargas, Monina C, NP [facility] None    Discharge Medications Allergies as of 02/24/2024   No Known Allergies      Medication List     STOP taking these medications    amoxicillin  500 MG capsule Commonly known as: AMOXIL        TAKE these medications    acetaminophen  500 MG tablet Commonly known as: TYLENOL  Take 2 tablets (1,000 mg total) by mouth every 8 (eight) hours as needed.   amoxicillin -clavulanate 875-125 MG tablet Commonly known as:  AUGMENTIN  Take 1 tablet by mouth 2 (two) times daily for 5 days.   cetirizine  10 MG tablet Commonly known as: ZYRTEC  Take 1 tablet (10 mg total) by mouth daily as needed for allergies.   CRANBERRY URINARY COMFORT PO Take by mouth.   diltiazem  30 MG tablet Commonly known as: Cardizem  Take 1 tablet every 4 hours AS NEEDED for AFIB heart rate >100 as long as top BP >100.   Eliquis  5 MG Tabs tablet Generic drug: apixaban  Take 1 tablet (5 mg total) by mouth 2 (two) times daily.   estradiol  0.1 MG/GM vaginal cream Commonly known as: ESTRACE  Apply to vaginal area 2 (two) times a week as directed.   hydrocortisone  cream 0.5 % Apply 1 Application topically 2 (two) times daily as needed for itching.   ketotifen 0.025 % ophthalmic solution Commonly known as: ZADITOR Place 1 drop into both eyes 2 (two) times daily as needed (itchy eyes). In the fall   Lumify 0.025 % Soln Generic drug: Brimonidine Tartrate Place 1 drop into both eyes daily. Red eyes   mineral oil-hydrophilic petrolatum ointment Apply 1 Application topically as needed for dry skin.   NON FORMULARY Take 1,000 mg by mouth daily. Plant calcium    ONE-DAILY MULTI VITAMINS PO Take 30 mLs by mouth daily. Ronal Dines liquid vitamin   OVER THE COUNTER MEDICATION Place 1 application  into both eyes at bedtime. Pure & Clean   Refresh Optive Mega-3 0.5-1-0.5 % Soln Generic drug: Carboxymeth-Glyc-Polysorb PF Place 1 drop into both eyes 4 (four) times daily as needed (dry eyes). Enhance with flax seed oil   rosuvastatin  10 MG tablet Commonly known as: CRESTOR  Take 1 tablet (10 mg total) by mouth daily. What changed: how much to take   SUMAtriptan  25 MG tablet Commonly known as: Imitrex  Take 1 tablet (25 mg total) by mouth daily as needed for migraine. May repeat in 2 hours if headache persists or recurs. Do not take more than 2 tablets/day.   triamcinolone cream 0.1 % Commonly known as: KENALOG Apply 1 Application  topically 2 (two) times daily as needed (itching).        Outstanding Labs/Studies N/A  Duration of Discharge Encounter: APP Time: 30 minutes   Signed, Waddell DELENA Donath, PA-C 02/24/2024, 2:49 PM

## 2024-02-24 NOTE — Care Management Obs Status (Signed)
 MEDICARE OBSERVATION STATUS NOTIFICATION   Patient Details  Name: Shelia [name] MRN: [id number] Date of Birth: 1941/06/01   Medicare Observation Status Notification Given:  Yes    Debarah Saunas, RN 02/24/2024, 3:38 PM

## 2024-02-24 NOTE — ED Notes (Signed)
Paged attending for RN 

## 2024-02-24 NOTE — Progress Notes (Signed)
 Cardiologist:  Pietro  Subjective:  Denies SSCP, palpitations or Dyspnea HR low in NSR   Objective:  Vitals:   02/24/24 0130 02/24/24 0611 02/24/24 0629 02/24/24 0723  BP: 124/74  138/65   Pulse: (!) 44 (!) 43 (!) 49   Resp: 15 12 14    Temp:    98 F (36.7 C)  TempSrc:      SpO2: 99% 100% 100%   Weight:      Height:        Intake/Output from previous day: No intake or output data in the 24 hours ending 02/24/24 0844  Physical Exam: Affect appropriate Healthy:  appears stated age HEENT: normal Neck supple with no adenopathy JVP normal no bruits no thyromegaly Lungs clear with no wheezing and good diaphragmatic motion Heart:  S1/S2 no murmur, no rub, gallop or click PMI normal Abdomen: benighn, BS positve, no tenderness, no AAA no bruit.  No HSM or HJR Distal pulses intact with no bruits No edema Neuro non-focal Skin warm and dry No muscular weakness   Lab Results: Basic Metabolic Panel: Recent Labs    02/23/24 1705  NA 139  K 4.1  CL 105  CO2 26  GLUCOSE 106*  BUN 21  CREATININE 0.78  CALCIUM  9.3   Liver Function Tests: No results for input(s): AST, ALT, ALKPHOS, BILITOT, PROT, ALBUMIN in the last 72 hours. No results for input(s): LIPASE, AMYLASE in the last 72 hours. CBC: Recent Labs    02/23/24 1705 02/24/24 0542  WBC 4.8 5.0  HGB 14.5 13.7  HCT 42.8 40.8  MCV 98.8 99.0  PLT 119* 104*     Imaging: CT Angio Chest PE W and/or Wo Contrast Result Date: 02/23/2024 CLINICAL DATA:  Pulmonary embolism suspected, high probability EXAM: CT ANGIOGRAPHY CHEST WITH CONTRAST TECHNIQUE: Multidetector CT imaging of the chest was performed using the standard protocol during bolus administration of intravenous contrast. Multiplanar CT image reconstructions and MIPs were obtained to evaluate the vascular anatomy. RADIATION DOSE REDUCTION: This exam was performed according to the departmental dose-optimization program which includes  automated exposure control, adjustment of the mA and/or kV according to patient size and/or use of iterative reconstruction technique. CONTRAST:  75mL OMNIPAQUE  IOHEXOL  350 MG/ML SOLN COMPARISON:  CT heart morphology April 18, 2021. FINDINGS: Cardiovascular: Satisfactory opacification of the pulmonary arteries to the segmental level. No evidence of pulmonary embolism. Cardiomegaly. Atherosclerotic calcifications of coronary arteries. No pericardial effusion. Mediastinum/Nodes: No enlarged mediastinal, hilar, or axillary lymph nodes. Thyroid  gland, trachea, and esophagus demonstrate no significant findings. Hiatal hernia. Lungs/Pleura: A few scattered pulmonary micro nodules (sub 3 mm) in right lower lobe subpleural (6/76, 83). No consolidation, significant emphysematous changes or honeycombing. Evaluation is suboptimal due to breathing motion artifact. There is mild bronchial and bronchiolar wall thickening. No pleural effusion. Mosaic ground-glass attenuation throughout both lungs likely physiologic (exam is acquired in expiratory phase.) Trachea and major airways are patent Upper Abdomen: Thickening of right adrenal gland incompletely assessed on current exam, similar to prior. Musculoskeletal: No suspicious osseous lesion. Review of the MIP images confirms the above findings. IMPRESSION: No suspicious finding to suggest pulmonary embolism. Few scattered pulmonary micro nodules in the right lung base, stable to prior. Follow-up according to guidelines below. Cardiomegaly. Fleischner Society 2017 Guidelines for Management of Incidentally Detected Solid Pulmonary Nodules in Adults Low-Risk Patient, Multiple: < 6 mm: No routine follow-up 6-8 mm: CT at 3-6 months, then consider CT at 18-24 months > 8 mm: CT at 3-6 months, then  consider CT at 18-24 months High-Risk Patient, Multiple: <6 mm: Optional CT at 12 months 6-8 mm: CT at 3-6 months, then at 18-24 months > 8mm: CT at 3-6 months, then at 18-24 months Note  -These recommendations do not apply to lung cancer screening, patients with immunosuppression, or patients with known primary cancer. https://urldefense.com/v3/[email or web address]://pubs.InstantPositions.nl.[id number]$ Electronically Signed   By: Duwaine Severs M.D.   On: 02/23/2024 19:55   ECHOCARDIOGRAM COMPLETE Result Date: 02/23/2024    ECHOCARDIOGRAM REPORT   Patient Name:   Shelia [name] Date of Exam: 02/23/2024 Medical Rec #:  [id number]           Height:       67.0 in Accession #:    [id number]          Weight:       136.8 lb Date of Birth:  06-Nov-1940           BSA:          1.721 m Patient Age:    83 years            BP:           92/58 mmHg Patient Gender: F                   HR:           52 bpm. Exam Location:  [address] Procedure: 2D Echo, Cardiac Doppler and Color Doppler (Both Spectral and Color            Flow Doppler were utilized during procedure). Indications:    Dyspnea on exertion [R06.09 (ICD-10-CM)]  History:        Patient has prior history of Echocardiogram examinations. Risk                 Factors:Former Smoker.  Sonographer:    Rosaline Fujisawa MHA, RDMS, RVT, RDCS Referring Phys: [id number] BRIAN S CRENSHAW  Sonographer Comments: Images reviewed by Doctor of the Day, Dr. Michele, and advised patient be transported to [facility] Emergency Department by EMS for evaluation. IMPRESSIONS  1. Left ventricular ejection fraction, by estimation, is 60 to 65%. The left ventricle has normal function. The left ventricle has no regional wall motion abnormalities. Left ventricular diastolic parameters are consistent with Grade I diastolic dysfunction (impaired relaxation).  2. Right ventricular systolic function is normal. The right ventricular size is normal. There is normal pulmonary artery systolic pressure. The estimated right ventricular systolic pressure is 19.1 mmHg.  3. Left  atrial size was mildly dilated.  4. The sonographer identified a possible right atrial mass, however, this is only noted in the subcostal view and it is also noted the mass is not mobile and that the tricuspid valve excursion can be seen moving over the mass, suggesting that it is more likely to be echo artifact. Also, the same linear artifact is seen extending into the left ventricle. This is consistent with comet tail artifact.. Right atrial size was moderately dilated.  5. The mitral valve is grossly normal. Trivial mitral valve regurgitation.  6. The aortic valve is tricuspid. Aortic valve regurgitation is trivial.  7. The pulmonic valve was abnormal.  8. The inferior vena cava is dilated in size with <50% respiratory variability, suggesting right atrial pressure of 15 mmHg. Comparison(s): No prior Echocardiogram. Changes from prior study are noted. 11/31/2023: LVEF 60-65%. FINDINGS  Left Ventricle: Left ventricular ejection fraction, by estimation, is 60 to 65%. The left ventricle has normal function. The left  ventricle has no regional wall motion abnormalities. The left ventricular internal cavity size was normal in size. There is  no left ventricular hypertrophy. Left ventricular diastolic parameters are consistent with Grade I diastolic dysfunction (impaired relaxation). Indeterminate filling pressures. Right Ventricle: The right ventricular size is normal. No increase in right ventricular wall thickness. Right ventricular systolic function is normal. There is normal pulmonary artery systolic pressure. The tricuspid regurgitant velocity is 1.01 m/s, and  with an assumed right atrial pressure of 15 mmHg, the estimated right ventricular systolic pressure is 19.1 mmHg. Left Atrium: Left atrial size was mildly dilated. Right Atrium: The sonographer identified a possible right atrial mass, however, this is only noted in the subcostal view and it is also noted the mass is not mobile and that the tricuspid  valve excursion can be seen moving over the mass, suggesting that it is more likely to be echo artifact. Also, the same linear artifact is seen extending into the left ventricle. This is consistent with comet tail artifact. Right atrial size was moderately dilated. Pericardium: There is no evidence of pericardial effusion. Mitral Valve: The mitral valve is grossly normal. Trivial mitral valve regurgitation. Tricuspid Valve: The tricuspid valve is grossly normal. Tricuspid valve regurgitation is trivial. Aortic Valve: The aortic valve is tricuspid. Aortic valve regurgitation is trivial. Aortic regurgitation PHT measures 1263 msec. Aortic valve mean gradient measures 2.5 mmHg. Aortic valve peak gradient measures 4.4 mmHg. Aortic valve area, by VTI measures 1.47 cm. Pulmonic Valve: The pulmonic valve was abnormal. Pulmonic valve regurgitation is mild. Aorta: The aortic root and ascending aorta are structurally normal, with no evidence of dilitation. Venous: The inferior vena cava is dilated in size with less than 50% respiratory variability, suggesting right atrial pressure of 15 mmHg. IAS/Shunts: No atrial level shunt detected by color flow Doppler.  LEFT VENTRICLE PLAX 2D LVIDd:         4.14 cm   Diastology LVIDs:         2.65 cm   LV e' medial:    10.70 cm/s LV PW:         0.96 cm   LV E/e' medial:  7.4 LV IVS:        0.69 cm   LV e' lateral:   14.00 cm/s LVOT diam:     1.61 cm   LV E/e' lateral: 5.6 LV SV:         36 LV SV Index:   21 LVOT Area:     2.04 cm  RIGHT VENTRICLE             IVC RV Basal diam:  3.71 cm     IVC diam: 2.93 cm RV Mid diam:    2.94 cm RV S prime:     14.50 cm/s TAPSE (M-mode): 2.4 cm LEFT ATRIUM             Index        RIGHT ATRIUM           Index LA diam:        3.40 cm 1.98 cm/m   RA Area:     14.70 cm LA Vol (A2C):   44.9 ml 26.09 ml/m  RA Volume:   31.60 ml  18.36 ml/m LA Vol (A4C):   44.3 ml 25.74 ml/m LA Biplane Vol: 46.8 ml 27.20 ml/m  AORTIC VALVE AV Area (Vmax):    1.50  cm AV Area (Vmean):   1.42 cm AV Area (VTI):  1.47 cm AV Vmax:           104.50 cm/s AV Vmean:          70.950 cm/s AV VTI:            0.246 m AV Peak Grad:      4.4 mmHg AV Mean Grad:      2.5 mmHg LVOT Vmax:         77.20 cm/s LVOT Vmean:        49.600 cm/s LVOT VTI:          0.178 m LVOT/AV VTI ratio: 0.72 AI PHT:            1263 msec  AORTA Ao Root diam: 2.69 cm Ao Asc diam:  3.10 cm MITRAL VALVE                          TRICUSPID VALVE MV Area (PHT): 3.48 cm               TR Peak grad:   4.1 mmHg MV Decel Time: 218 msec               TR Vmax:        101.00 cm/s MR Peak grad:    91.0 mmHg MR Vmax:         477.00 cm/s          SHUNTS MR PISA Nyquist:             0.9 m/s  Systemic VTI:  0.18 m MR PISA:         1.16 cm             Systemic Diam: 1.61 cm MR PISA Radius:  0.43 cm MV E velocity: 79.10 cm/s MV A velocity: 47.20 cm/s MV E/A ratio:  1.68 Vinie Maxcy MD Electronically signed by Vinie Maxcy MD Signature Date/Time: 02/23/2024/7:43:17 PM    Final     Cardiac Studies:  ECG: SB no acute changes   Telemetry: SR/SB no PAF rates 45-55 bpm  Echo:   Medications:    rosuvastatin   20 mg Oral Daily      sodium chloride       Assessment/Plan:   Abnormal Echo:  likely artifact. Discussed options and favor cardiac MRI this am to further evaluate as opposed to TEE. Patient not claustrophobic. D/C heparin  CTA with no PE and RA too opacified using PE protocol to evaluate mass. Resume eliquis  Discussed not missing any doses.  SSS/PAF:  NSR not on any AV nodal drugs. Consider outpatient monitor but no high grade AV block noted Eliquis  Dyspnea:  w/u in progress CTA no PE Echo with normal EF only mild MR/AR Normal lung exam Hct 40.8 No chest pain or acute ECG changes Troponin negative   Ok to d/c home if MRI shows no RA abnormality  Shelia [name] 02/24/2024, 8:44 AM

## 2024-02-24 NOTE — Telephone Encounter (Signed)
   Called patient to discuss results of cardiac MRI that was performed today. Reassured her that there was no detectable right atrial mass on MRI, normal BiV size and function and some biatrial dilation.   Patient expressed understanding and was relieved to get her results. Offered follow up, patient declined at this time. All questions were answered.   Waddell DELENA Donath, PA-C 02/24/2024 1:52 PM

## 2024-02-24 NOTE — Progress Notes (Addendum)
 PHARMACY - ANTICOAGULATION CONSULT NOTE  Pharmacy Consult for heparin  initiation Indication: right atrial mass   No Known Allergies  Patient Measurements: Height: 5' 6.5 (168.9 cm) Weight: 60.8 kg (134 lb) IBW/kg (Calculated) : 60.45 HEPARIN  DW (KG): 60.8  Vital Signs: Temp: 98.2 F (36.8 C) (07/29 2034) Temp Source: Oral (07/29 2034) BP: 138/65 (07/30 0629) Pulse Rate: 49 (07/30 0629)  Labs: Recent Labs    02/23/24 1705 02/23/24 1905 02/24/24 0542  HGB 14.5  --  13.7  HCT 42.8  --  40.8  PLT 119*  --  104*  APTT  --   --  >200*  HEPARINUNFRC  --   --  >1.10*  CREATININE 0.78  --   --   TROPONINIHS 4 5  --     Estimated Creatinine Clearance: 52.7 mL/min (by C-G formula based on SCr of 0.78 mg/dL).   Medical History: Past Medical History:  Diagnosis Date   Anxiety    Arthritis    left knee; left hip; lower back; mild to moderate in right hip (09/30/2016)   Atrial fibrillation (HCC)    Chronic lower back pain    Gastritis    Heart palpitations    History of bone density study 2018   History of mammogram 2021   History of MRI 2018   History of Papanicolaou smear of cervix    Hypercholesteremia    Osteopenia    Seasonal allergies    Assessment: Shelia [name] is a 83 y.o. year old female admitted on 02/23/2024 with shortness of breath on exertion and found to have possible right atrial mass on TTE. Of note, cardiology felt may be artifact and further investigation pending. CTA chest negative for PE. On eliquis  for afib prior to admission (last dose 7/29 @ 0830). Pt noted to have missed 1-2 doses in last two weeks. Given the presence of DOAC therapy prior to admission, will monitor with aPTT until correlates with heparin  level. Pharmacy consulted to dose heparin .  aPTT >200, supratherapeutic Heparin  level >1.10, supratherapeutic as expected with presence of DOAC prior to admission Discussed with nurse, no issues with infusion or bleeding noted.   Goal of  Therapy:  Anti-Xa goal 0.3-0.7 aPTT 66-102 seconds Monitor platelets by anticoagulation protocol: Yes   Plan: Hold heparin  drip x 1 hour and restart at decreased rate at 950 units/hr 8h aPTT  Daily aPTT, heparin  level, CBC, and monitoring for bleeding F/u TEE/cardiac MRI  F/u plans for anticoagulation   Thank you for allowing pharmacy to participate in this patient's care.  Leonor GORMAN Bash, PharmD Emergency Medicine Clinical Pharmacist 02/24/2024,7:28 AM  ----- Addendum:   Heparin  discontinued and switched back to oral anticoagulation with eliquis  per cardiology.  Thank you for allowing pharmacy to participate in this patient's care.  Leonor GORMAN Bash, PharmD Emergency Medicine Clinical Pharmacist 02/24/2024,8:49 AM

## 2024-02-24 NOTE — ED Notes (Signed)
 CCMD called.

## 2024-02-24 NOTE — Progress Notes (Signed)
 Transition of Care [facility]) - Emergency Department Mini Assessment   Patient Details  Name: Shelia [name] MRN: [id number] Date of Birth: 30-Aug-1940  Transition of Care [facility]) CM/SW Contact:    Debarah Saunas, RN Phone Number: 02/24/2024, 3:39 PM   Clinical Narrative:  Transition of Care Department [facility]) has reviewed patient and no TOC needs have been identified at this time. We will continue to monitor patient advancement through interdisciplinary progression rounds. If new patient transition needs arise, please place a TOC consult.   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   Thrombocytopenia (HCC) 09/13/2021   Leukopenia 09/13/2021   Elevated liver enzymes 09/13/2021   Hypercoagulable state due to persistent atrial fibrillation (HCC) 07/25/2021   Paroxysmal A-fib (HCC)    Primary osteoarthritis of left hip 09/29/2016   Urinary incontinence 06/25/2016   Lumbar radiculopathy 06/18/2016   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 [place] [address] Phone: [phone] Fax: [phone]  [name] [address] [place] [address] Phone:  [phone] Fax: [phone]

## 2024-02-24 NOTE — ED Notes (Addendum)
 Paper work reviewed, no new onset distress at this time or question. Pt is leaving with for home.

## 2024-02-26 NOTE — Progress Notes (Signed)
 Cardiology Office Note:    Date:  03/09/2024   ID:  AZHANE ECKART, DOB 11-02-1940, MRN [id number]  PCP:  Phyllis Jereld BROCKS, NP  Cardiologist:  Redell Shallow, MD Electrophysiologist:  Soyla Gladis Norton, MD     Referring MD: Phyllis Jereld BROCKS*   Chief Complaint: follow-up of shortness of breath  History of Present Illness:    Shelia [name] is a 83 y.o. 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 Shallow and Dr. Norton and presents today for follow-up of shortness of breath.     CAD - Coronary CTA in 03/2021 showed coronary calcium  score of 192 (63% for age and sex) with 25-49% stenosis of proximal LAD. FFR negative.  Persistent Atrial Fibrillation - Initial diagnosed in 2021. - S/p ablation in 05/2022.  - Recurrence and s/p DCCV in 04/2023 with long post cardioversion pauses noted  PFO - Small PFO noted on CTA in 05/2022  prior to atrial fibrillation ablation.     Patient is primarily followed with CAD and atrial fibrillation as above. She was recently seen by Dr. Shallow in the office on 02/04/2024 at which time she reported increased dyspnea on exertion with more vigorous activities. Echo was ordered. She presented for this on 02/23/2024 and there was initially concern for a possible right atrial mass  by the sonographer so she was sent to the ED and was admitted. Formal read showed LVEF of 60-65% with grade 1 diastolic dysfunction, normal RV  size and function, trivial MR, and trivial AI. The right atrial mass was felt to likely be artifact.  Chest CTA was negative for PE. Cardiac MRI showed normal biventricular chamber size and function with biatrial dilation but no evidence of right atrial mass. Outpatient monitor was discussed but patient declined.    Patient presents today for follow-up.  Here with her husband.  She continues to have dyspnea on exertion when she is walking briskly or going up stairs/uphill.  She has associated heart pounding with this. She also goes to [employer] and will note these symptoms when she is working out on the elliptical.  She states this has been going on for at least 6 months.  Symptoms are stable since last visit.  She denies any shortness of breath when walking at on a flat surface at a normal pace or with routine activities.  Interestingly, she reports similar symptoms of dyspnea when walking uphill when she was initially with atrial fibrillation.  No orthopnea or PND.  She has some occasional mild edema around her left ankle but nothing significant on exam.  She states she used to wear compression stockings which helped but she has not been doing this lately.  She also has some hyperpigmentation of her left lower extremity which she was concerned about. Looks consistent with chronic venous insufficiency.  She has good posterior tibial and distal pedal pulses on exam and denies any claudication. She denies any chest pain.  No palpitations other than the heart pounding associated with the dyspnea on exertion as described above.  No lightheadedness, dizziness or syncope.   EKGs/Labs/Other Studies Reviewed:    The following studies were reviewed:  Coronary CTA 04/18/2021: Impressions: 1. Coronary calcium  score of 192 (LAD). This was 2 percentile for age and sex matched control. 2. Normal coronary origin with  Left dominance. 3. There is proximal LAD calcified plaque with 25-49% stenosis. Sending for FFR analysis. 4.  Aortic atherosclerosis. _______________  Echocardiogram 02/23/2024: Impressions: 1. Left ventricular ejection fraction, by estimation, is 60 to 65%. The  left ventricle has normal function. The left ventricle has no regional  wall motion abnormalities. Left ventricular diastolic parameters are   consistent with Grade I diastolic  dysfunction (impaired relaxation).   2. Right ventricular systolic function is normal. The right ventricular  size is normal. There is normal pulmonary artery systolic pressure. The  estimated right ventricular systolic pressure is 19.1 mmHg.   3. Left atrial size was mildly dilated.   4. The sonographer identified a possible right atrial mass, however, this  is only noted in the subcostal view and it is also noted the mass is not  mobile and that the tricuspid valve excursion can be seen moving over the  mass, suggesting that it is  more likely to be echo artifact. Also, the same linear artifact is seen  extending into the left ventricle. This is consistent with comet tail  artifact.. Right atrial size was moderately dilated.   5. The mitral valve is grossly normal. Trivial mitral valve  regurgitation.   6. The aortic valve is tricuspid. Aortic valve regurgitation is trivial.   7. The pulmonic valve was abnormal.   8. The inferior vena cava is dilated in size with <50% respiratory  variability, suggesting right atrial pressure of 15 mmHg.  _______________  Cardiac MRI 02/24/2024: Impressions: 1.  No right atrial mass is detected. 2. Normal biventricular chamber size and function. LVEF 67%, RVEF 58%. 3.  Biatrial dilation.   EKG:  EKG not ordered today.   Recent Labs: 08/11/2023: TSH 3.37 02/04/2024: ALT 37 02/23/2024: BUN 21; Creatinine, Ser 0.78; Potassium 4.1; Sodium 139 02/24/2024: Hemoglobin 13.7; Platelets 104  Recent Lipid Panel    Component Value Date/Time   CHOL 147 02/04/2024 1418   TRIG 55 02/04/2024 1418   HDL 62 02/04/2024 1418   CHOLHDL 2.4 02/04/2024 1418   CHOLHDL 2.7 08/11/2023 0856   LDLCALC 73 02/04/2024 1418   LDLCALC 82 08/11/2023 0856    Physical Exam:    Vital Signs: BP 102/60   Pulse (!) 52   Ht 5' 6.5 (1.689 m)   Wt 133 lb (60.3 kg)   SpO2 96%   BMI 21.15 kg/m     Wt Readings from Last 3  Encounters:  03/09/24 133 lb (60.3 kg)  02/23/24 134 lb (60.8 kg)  02/08/24 136 lb 12.8 oz (62.1 kg)     General: 83 y.o. female in no acute distress. HEENT: Normocephalic and atraumatic. Sclera clear.  Neck: Supple. No carotid bruits. No JVD. Heart:  Mildly bradycardic with normal rhythm. Distinct S1 and S2. No murmurs, gallops, or rubs.  Lungs: No increased work of breathing. Clear to ausculation bilaterally. No wheezes, rhonchi, or rales.  Extremities: No lower extremity edema. Mild hyperpigmentation of left lower extremity with some mild varicose veins. Posterior tibial and distal pedal pulses 2+ and equal bilaterally. Skin: Warm and dry.  Neuro: No focal deficits. Psych: Normal affect. Responds appropriately.  Assessment:    1. Dyspnea on exertion   2. Coronary artery disease involving native coronary artery of native heart without angina pectoris   3. Persistent atrial fibrillation (HCC)   4. PFO (patent foramen ovale)   5. Hyperlipidemia, unspecified hyperlipidemia type     Plan:    Dyspnea on Exertion Patient reported some new dyspnea  on exertion over the last 6 months. Echo showed LVEF of 60-65% with grade 1 diastolic dysfunction, normal RV  size and function, trivial MR, and trivial AI. Chest CTA negative for PE.  - Continues to have dyspnea on exertion when walking briskly or uphill with associated heart pounding (symptoms start at same time). Symptoms stable from last visit. - Interestingly, she reports similar symptoms when she was first diagnosed with atrial fibrillation. Recent EKG on 02/23/2024 showed normal sinus rhythm and she sounds like she is still is sinus rhythm on today's exam. Will order a 1 week Zio monitor to look for recurrent atrial fibrillation or any other arrhythmias. Also discussed that dyspnea could be an anginal equivalent. Discussed stress test vs repeat coronary CTA. She would like to wait for monitor results before proceeding with additional ischemic  evaluation.  CAD Coronary CTA in 03/2021 showed coronary calcium  score of 192 (63% for age and sex) with 25-49% stenosis of proximal LAD. FFR negative. - No chest pain.  - No aspirin  given need for full anticoagulation with Eliquis .  - Continue Crestor  20mg  daily.   Persistent Atrial Fibrillation Initial diagnosed in 2018. S/p ablation in 05/2022 with recurrence and then DCCV in 04/2023 with long post cardioversion pauses noted.  - Maintaining sinus rhythm. Rate in the low 50s. - Continue Diltiazem  30mg  PRN for tachypalpitations.  - Continue Eliquis  5mg  twice daily. Will need to continue to watch weight closely as she is borderline for meeting criteria for reduced dose based on age and weight. - Ordering 1 week Zio monitor for further evaluation of dyspnea with associated heart pounding as above.  Small PFO Noted on chest CTA in 05/2022. Not mentioned on recent cardiac MRI in 01/2024.   Hyperlipidemia Lipid panel in 01/2024: Total Cholesterol 147, Triglycerides 55, HDL 62, LDL 73. LDL goal <70 given CAD.  - Continue Crestor  20mg  daily.   Disposition: Follow up with me in 2 months.   Signed, Aline FORBES Door, PA-C  03/09/2024 10:16 AM    [facility]

## 2024-03-01 ENCOUNTER — Encounter: Payer: Self-pay | Admitting: *Deleted

## 2024-03-01 DIAGNOSIS — M2041 Other hammer toe(s) (acquired), right foot: Secondary | ICD-10-CM | POA: Diagnosis not present

## 2024-03-01 DIAGNOSIS — M21612 Bunion of left foot: Secondary | ICD-10-CM | POA: Diagnosis not present

## 2024-03-01 DIAGNOSIS — D2371 Other benign neoplasm of skin of right lower limb, including hip: Secondary | ICD-10-CM | POA: Diagnosis not present

## 2024-03-01 DIAGNOSIS — M21611 Bunion of right foot: Secondary | ICD-10-CM | POA: Diagnosis not present

## 2024-03-09 ENCOUNTER — Ambulatory Visit

## 2024-03-09 ENCOUNTER — Encounter: Payer: Self-pay | Admitting: Student

## 2024-03-09 ENCOUNTER — Telehealth: Payer: Self-pay

## 2024-03-09 ENCOUNTER — Ambulatory Visit: Attending: Student | Admitting: Student

## 2024-03-09 VITALS — BP 102/60 | HR 52 | Ht 66.5 in | Wt 133.0 lb

## 2024-03-09 DIAGNOSIS — Q2112 Patent foramen ovale: Secondary | ICD-10-CM

## 2024-03-09 DIAGNOSIS — I4819 Other persistent atrial fibrillation: Secondary | ICD-10-CM

## 2024-03-09 DIAGNOSIS — R0609 Other forms of dyspnea: Secondary | ICD-10-CM

## 2024-03-09 DIAGNOSIS — I2510 Atherosclerotic heart disease of native coronary artery without angina pectoris: Secondary | ICD-10-CM

## 2024-03-09 DIAGNOSIS — E785 Hyperlipidemia, unspecified: Secondary | ICD-10-CM

## 2024-03-09 NOTE — Patient Instructions (Signed)
 Medication Instructions:  Your physician recommends that you continue on your current medications as directed. Please refer to the Current Medication list given to you today.  *If you need a refill on your cardiac medications before your next appointment, please call your pharmacy*  Lab Work: NONE If you have labs (blood work) drawn today and your tests are completely normal, you will receive your results only by: MyChart Message (if you have MyChart) OR A paper copy in the mail If you have any lab test that is abnormal or we need to change your treatment, we will call you to review the results.  Testing/Procedures: Shelia [name]- Long Term Monitor Instructions  Your physician has requested you wear a ZIO patch monitor for 14 days.  This is a single patch monitor. [employer] supplies one patch monitor per enrollment. Additional stickers are not available. Please do not apply patch if you will be having a Nuclear Stress Test,  Echocardiogram, Cardiac CT, MRI, or Chest Xray during the period you would be wearing the  monitor. The patch cannot be worn during these tests. You cannot remove and re-apply the  ZIO XT patch monitor.  Your ZIO patch monitor will be mailed 3 day USPS to your address on file. It may take 3-5 days  to receive your monitor after you have been enrolled.  Once you have received your monitor, please review the enclosed instructions. Your monitor  has already been registered assigning a specific monitor serial # to you.  Billing and Patient Assistance Program Information  We have supplied [employer] with any of your insurance information on file for billing purposes. [employer] offers a sliding scale Patient Assistance Program for patients that do not have  insurance, or whose insurance does not completely cover the cost of the ZIO monitor.  You must apply for the Patient Assistance Program to qualify for this discounted rate.  To apply, please call [employer] at [phone], select  option 4, select option 2, ask to apply for  Patient Assistance Program. Meredeth will ask your household income, and how many people  are in your household. They will quote your out-of-pocket cost based on that information.  [employer] will also be able to set up a 11-month, interest-free payment plan if needed.  Applying the monitor   Shave hair from upper left chest.  Hold abrader disc by orange tab. Rub abrader in 40 strokes over the upper left chest as  indicated in your monitor instructions.  Clean area with 4 enclosed alcohol pads. Let dry.  Apply patch as indicated in monitor instructions. Patch will be placed under collarbone on left  side of chest with arrow pointing upward.  Rub patch adhesive wings for 2 minutes. Remove white label marked 1. Remove the white  label marked 2. Rub patch adhesive wings for 2 additional minutes.  While looking in a mirror, press and release button in center of patch. A small green light will  flash 3-4 times. This will be your only indicator that the monitor has been turned on.  Do not shower for the first 24 hours. You may shower after the first 24 hours.  Press the button if you feel a symptom. You will hear a small click. Record Date, Time and  Symptom in the Patient Logbook.  When you are ready to remove the patch, follow instructions on the last 2 pages of Patient  Logbook. Stick patch monitor onto the last page of Patient Logbook.  Place Patient Logbook in the blue  and white box. Use locking tab on box and tape box closed  securely. The blue and white box has prepaid postage on it. Please place it in the mailbox as  soon as possible. Your physician should have your test results approximately 7 days after the  monitor has been mailed back to [facility].  Call [facility] Customer Care at [phone] if you have questions regarding  your ZIO XT patch monitor. Call them immediately if you see an orange light blinking on your  monitor.   If your monitor falls off in less than 4 days, contact our Monitor department at [phone].  If your monitor becomes loose or falls off after 4 days call [employer] at [phone] for  suggestions on securing your monitor   Follow-Up: At [facility], you and your health needs are our priority.  As part of our continuing mission to provide you with exceptional heart care, our providers are all part of one team.  This team includes your primary Cardiologist (physician) and Advanced Practice Providers or APPs (Physician Assistants and Nurse Practitioners) who all work together to provide you with the care you need, when you need it.  Your next appointment:   2 month(s)  Provider:   Callie Goodrich, PA-C        We recommend signing up for the patient portal called MyChart.  Sign up information is provided on this After Visit Summary.  MyChart is used to connect with patients for Virtual Visits (Telemedicine).  Patients are able to view lab/test results, encounter notes, upcoming appointments, etc.  Non-urgent messages can be sent to your provider as well.   To learn more about what you can do with MyChart, go to [email or web address].

## 2024-03-09 NOTE — Progress Notes (Unsigned)
 Applied a 7 day Zio XT monitor to patient in the office  [place] to read

## 2024-03-09 NOTE — Telephone Encounter (Signed)
 Spoke with patient as request by Medina-Vargas, Monina C, NP to clarify paperwork that was received from her insurance to house calls. Patient states that the insurance pays for the house calls. Patient paperwork will be place in the scan center and patient will bring the rest of the paperwork to her next appointment in October. Medina-Vargas, Monina C, NP has been notified

## 2024-03-10 ENCOUNTER — Other Ambulatory Visit (HOSPITAL_COMMUNITY): Payer: Self-pay

## 2024-03-10 DIAGNOSIS — D2371 Other benign neoplasm of skin of right lower limb, including hip: Secondary | ICD-10-CM | POA: Diagnosis not present

## 2024-03-10 DIAGNOSIS — M2041 Other hammer toe(s) (acquired), right foot: Secondary | ICD-10-CM | POA: Diagnosis not present

## 2024-03-10 DIAGNOSIS — L02611 Cutaneous abscess of right foot: Secondary | ICD-10-CM | POA: Diagnosis not present

## 2024-03-10 DIAGNOSIS — M21612 Bunion of left foot: Secondary | ICD-10-CM | POA: Diagnosis not present

## 2024-03-10 DIAGNOSIS — M21611 Bunion of right foot: Secondary | ICD-10-CM | POA: Diagnosis not present

## 2024-03-20 ENCOUNTER — Encounter: Payer: Self-pay | Admitting: Adult Health

## 2024-03-20 ENCOUNTER — Ambulatory Visit: Payer: Self-pay | Admitting: Adult Health

## 2024-03-20 NOTE — Progress Notes (Signed)
-    Bone density resulted as osteopenic, recommending to start calcim supplement of 1,200 mg daily and vitamin D3 1,000 units daily, exercise at least 150 minutes/week.

## 2024-03-23 ENCOUNTER — Other Ambulatory Visit: Payer: Self-pay | Admitting: Adult Health

## 2024-03-23 ENCOUNTER — Ambulatory Visit: Payer: Self-pay | Admitting: Student

## 2024-03-23 DIAGNOSIS — I4819 Other persistent atrial fibrillation: Secondary | ICD-10-CM

## 2024-03-23 DIAGNOSIS — Z1231 Encounter for screening mammogram for malignant neoplasm of breast: Secondary | ICD-10-CM

## 2024-03-23 NOTE — Telephone Encounter (Signed)
 Patient needs a referral regarding annual mammogram

## 2024-03-23 NOTE — Telephone Encounter (Signed)
 Mammogram ordered, breast center will call to schedule it with her.

## 2024-03-25 DIAGNOSIS — L03031 Cellulitis of right toe: Secondary | ICD-10-CM | POA: Diagnosis not present

## 2024-03-25 DIAGNOSIS — L600 Ingrowing nail: Secondary | ICD-10-CM | POA: Diagnosis not present

## 2024-03-31 NOTE — Telephone Encounter (Signed)
 Patient response routed to PCP Medina-Vargas, Monina C, NP

## 2024-04-04 DIAGNOSIS — N3020 Other chronic cystitis without hematuria: Secondary | ICD-10-CM | POA: Diagnosis not present

## 2024-04-04 DIAGNOSIS — R3915 Urgency of urination: Secondary | ICD-10-CM | POA: Diagnosis not present

## 2024-04-25 DIAGNOSIS — L600 Ingrowing nail: Secondary | ICD-10-CM | POA: Diagnosis not present

## 2024-04-25 DIAGNOSIS — M21612 Bunion of left foot: Secondary | ICD-10-CM | POA: Diagnosis not present

## 2024-04-25 DIAGNOSIS — M21611 Bunion of right foot: Secondary | ICD-10-CM | POA: Diagnosis not present

## 2024-04-25 DIAGNOSIS — L03032 Cellulitis of left toe: Secondary | ICD-10-CM | POA: Diagnosis not present

## 2024-04-25 DIAGNOSIS — L03031 Cellulitis of right toe: Secondary | ICD-10-CM | POA: Diagnosis not present

## 2024-04-26 DIAGNOSIS — H0288A Meibomian gland dysfunction right eye, upper and lower eyelids: Secondary | ICD-10-CM | POA: Diagnosis not present

## 2024-04-26 DIAGNOSIS — H0288B Meibomian gland dysfunction left eye, upper and lower eyelids: Secondary | ICD-10-CM | POA: Diagnosis not present

## 2024-04-26 DIAGNOSIS — H16223 Keratoconjunctivitis sicca, not specified as Sjogren's, bilateral: Secondary | ICD-10-CM | POA: Diagnosis not present

## 2024-04-26 DIAGNOSIS — H43811 Vitreous degeneration, right eye: Secondary | ICD-10-CM | POA: Diagnosis not present

## 2024-04-28 DIAGNOSIS — D320 Benign neoplasm of cerebral meninges: Secondary | ICD-10-CM | POA: Diagnosis not present

## 2024-05-03 ENCOUNTER — Encounter: Payer: Self-pay | Admitting: Adult Health

## 2024-05-03 ENCOUNTER — Encounter: Payer: PPO | Admitting: Family

## 2024-05-04 NOTE — Progress Notes (Addendum)
 Cardiology Office Note:    Date:  05/10/2024   ID:  Shelia [name], DOB Feb 15, 1941, MRN [id number]  PCP:  Phyllis Jereld BROCKS, NP  Cardiologist:  Redell Shallow, MD Electrophysiologist:  Soyla Gladis Norton, MD     Referring MD: Phyllis Jereld BROCKS*   Chief Complaint: follow-up of dyspnea on exertion  History of Present Illness:    Shelia [name] is a 83 y.o. 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 Shallow and Dr. Norton and presents today for follow-up of dyspnea on exertion.     CAD - Coronary CTA in 03/2021 showed coronary calcium  score of 192 (63% for age and sex) with 25-49% stenosis of proximal LAD. FFR negative.   Persistent Atrial Fibrillation - Initial diagnosed in 2021. - S/p ablation in 05/2022.  - Recurrence and s/p DCCV in 04/2023 with long post cardioversion pauses noted. - Monitor 02/2024 showed 114 short episodes of SVT (longest run 15 beats) and rare PACs/ PVCs but no atrial fibrillation.    PFO - Small PFO noted on CTA in 05/2022  prior to atrial fibrillation ablation.     Patient is primarily followed by Cardiology for CAD and atrial fibrillation. More details about  this can be seen under Narrative History above. She was seen by Dr. Shallow in 01/2024 at which time she reported increased dyspnea on exertion with more vigorous activities. Echo was ordered. She presented for this on 02/23/2024 and there was initially concern for a possible right atrial mass by the sonographer so she was sent to the ED and was admitted. Formal read showed LVEF of 60-65% with grade 1 diastolic dysfunction, normal RV size and function, trivial MR, and trivial AI. The right atrial mass was felt to likely be artifact. Chest CTA was negative for PE. Cardiac MRI  showed normal biventricular chamber size and function with biatrial dilation but no evidence of right atrial mass. Outpatient monitor was discussed but patient declined.   She was last seen by me in the office on 02/2024 at which time she continued to report dyspnea on exertion when walking briskly or going up stairs/ uphill. She has associated heart pounding with this. However, overall symptoms were stable. She reports similar symptoms when she was initially diagnosed with atrial fibrillation. Monitor was offered again and she was agreeable to this.  Monitor showed 114 short episodes of SVT (longest run 15 beats) and rare PACs/ PVCs but no atrial fibrillation.   Patient presents today for follow-up. Here with her husband. She still reports dyspnea when walking uphill or an incline. This started about 6-9 months ago and is unchanged from last visit. She denies any dyspnea when walking on a flat surface or at rest. Otherwise, she is without any cardiac complaints.   ROS: No chest pain, orthopnea, PND, lower extremity edema, palpitations, lightheadedness, dizziness, syncope.    EKGs/Labs/Other Studies Reviewed:    The following studies were reviewed:  Coronary CTA 04/18/2021: Impressions: 1. Coronary calcium  score of 192 (LAD). This was 52 percentile for age and sex matched control. 2. Normal coronary origin with Left dominance. 3. There is proximal LAD calcified plaque with 25-49% stenosis. Sending for FFR analysis. 4.  Aortic atherosclerosis. _______________   Echocardiogram 02/23/2024: Impressions: 1. Left ventricular ejection fraction, by estimation, is 60 to 65%. The  left ventricle has normal function. The left ventricle has no regional  wall motion abnormalities. Left ventricular diastolic parameters are  consistent with Grade I diastolic  dysfunction (impaired relaxation).   2. Right ventricular systolic function is normal. The right ventricular  size is normal. There is normal  pulmonary artery systolic pressure. The  estimated right ventricular systolic pressure is 19.1 mmHg.   3. Left atrial size was mildly dilated.   4. The sonographer identified a possible right atrial mass, however, this  is only noted in the subcostal view and it is also noted the mass is not  mobile and that the tricuspid valve excursion can be seen moving over the  mass, suggesting that it is  more likely to be echo artifact. Also, the same linear artifact is seen  extending into the left ventricle. This is consistent with comet tail  artifact.. Right atrial size was moderately dilated.   5. The mitral valve is grossly normal. Trivial mitral valve  regurgitation.   6. The aortic valve is tricuspid. Aortic valve regurgitation is trivial.   7. The pulmonic valve was abnormal.   8. The inferior vena cava is dilated in size with <50% respiratory  variability, suggesting right atrial pressure of 15 mmHg.  _______________   Cardiac MRI 02/24/2024: Impressions: 1.  No right atrial mass is detected. 2. Normal biventricular chamber size and function. LVEF 67%, RVEF 58%. 3.  Biatrial dilation. _______________  Monitor 03/09/2024 to 03/16/2024: Patient had a min HR of 37 bpm, max HR of 148 bpm, and avg HR of 54 bpm. Predominant underlying rhythm was Sinus Rhythm. Slight P wave morphology changes were noted. 114 Supraventricular Tachycardia runs occurred, the run with the fastest interval  lasting 7 beats with a max rate of 148 bpm, the longest lasting 15 beats with an avg rate of 104 bpm. Some episodes of Supraventricular Tachycardia may be possible Atrial Tachycardia with variable block. Isolated SVEs were rare (<1.0%), SVE Couplets were  rare (<1.0%), and SVE Triplets were rare (<1.0%). Isolated VEs were rare (<1.0%), VE Couplets were rare (<1.0%), and no VE Triplets were present.   Sinus bradycardia, normal sinus rhythm, occasional PAC, short runs of SVT, occasional PVC and rare  couplet.  EKG:  EKG not ordered today.   Recent Labs: 08/11/2023: TSH 3.37 02/04/2024: ALT 37 02/23/2024: BUN 21; Creatinine, Ser 0.78; Potassium 4.1; Sodium 139 02/24/2024: Hemoglobin 13.7; Platelets 104  Recent Lipid Panel    Component Value Date/Time   CHOL 147 02/04/2024 1418   TRIG 55 02/04/2024 1418   HDL 62 02/04/2024 1418   CHOLHDL 2.4 02/04/2024 1418   CHOLHDL 2.7 08/11/2023 0856   LDLCALC 73 02/04/2024 1418   LDLCALC 82 08/11/2023 0856    Physical Exam:    Vital Signs: BP 97/66   Pulse 83   Ht 5' 7 (1.702 m)   Wt 135 lb (61.2 kg)   SpO2 96%   BMI 21.14 kg/m     Wt Readings from Last 3 Encounters:  05/10/24 135 lb (61.2 kg)  03/09/24 133 lb (60.3 kg)  02/23/24 134 lb (60.8 kg)     General: 83 y.o. female in no acute distress. HEENT: Normocephalic and atraumatic. Sclera clear.  Neck: Supple. No carotid bruits. No JVD. Heart: RRR. Distinct S1 and S2. No murmurs, gallops, or rubs.  Lungs: No increased work of breathing. Clear to ausculation bilaterally. No wheezes, rhonchi, or rales.  Extremities: No lower extremity edema. Varicose veins noted in bilateral legs.  Skin: Warm and dry. Neuro: No focal deficits. Psych: Normal affect. Responds appropriately.  Assessment:    1. Dyspnea on exertion   2. Coronary artery disease involving native coronary artery of native heart without angina pectoris   3. Persistent atrial fibrillation (HCC)   4. PFO (patent foramen ovale)   5. Hyperlipidemia, unspecified hyperlipidemia type     Plan:    Dyspnea on Exertion Patient has been dealing with dyspnea on exertion for the last several months. Echo in 01/2024 showed LVEF of 60-65% with grade 1 diastolic dysfunction, normal RV  size and function, trivial MR, and trivial AI. Chest CTA in 01/2024 negative for PE. She reported similar symptoms in the past when she was in atrial fibrillation; however, monitor in 02/2024 showed no atrial fibrillation.  - She continues to have  dyspnea when walking up an incline/ stairs which is new over the last 6-9 months. Dyspnea stable and unchanged from last visit.  No chest pain.  - Discussed dyspnea as a possible anginal equivalent. Discussed non-invasive ischemic testing with nuclear stress test or coronary CTA (my preference would be a coronary CTA). She would like to think about this and discuss with a family member who is a physician. She will let us  know if she decides to proceed with one of these options. I did go ahead and discuss risks and benefits of both with her.    CAD Coronary CTA in 03/2021 showed coronary calcium  score of 192 (63% for age and sex) with 25-49% stenosis of proximal LAD. FFR negative. - No chest pain.  - No aspirin  given need for full anticoagulation with Eliquis .  - Continue Crestor  20mg  daily.    Persistent Atrial Fibrillation Initial diagnosed in 2018. S/p ablation in 05/2022 with recurrence and then DCCV in 04/2023 with long post cardioversion pauses noted. Monitor in 02/2024 showed 14 short episodes of SVT (longest run 15 beats) and rare PACs/ PVCs but no atrial fibrillation.  - Maintaining sinus rhythm on exam.  - She has a prescription of Diltiazem  30mg  PRN for tachypalpitations. However, she states that she never needs this and would like this removed from her med list.  - Continue Eliquis  5mg  twice daily. Will need to continue to watch weight closely as she is borderline for meeting criteria for reduced dose based on age and weight. Weight 61.2 kg today.   Small PFO Noted on chest CTA in 05/2022. Not mentioned on recent cardiac MRI in 01/2024.    Hyperlipidemia Lipid panel in 01/2024: Total Cholesterol 147, Triglycerides 55, HDL 62, LDL 73. LDL goal <70 given CAD.  - Continue Crestor  20mg  daily.  - LDL slightly above goal. Discussed possibly increasing Crestor . She would like to try diet modifications for now (although it sounds like she is already eating a pretty heart healthy diet). She will  let us  know if she decides she wants to go ahead and increase Crestor .   Disposition: Follow up in 6 months.    Signed, Aline FORBES Door, PA-C  05/10/2024 10:32 PM    [facility]  ADDENDUM 06/17/2024: Patient called office on 06/16/2024 and stated she would like to proceed with stress test and that she would prefer a treadmill over a lexiscan myoview. Therefore, will place order. I consented patient for this during her visit.  Shared Decision Making/Informed Consent{ The risks [chest pain, shortness of breath, cardiac arrhythmias, dizziness, blood pressure fluctuations, myocardial infarction, stroke/transient ischemic attack, nausea, vomiting, allergic reaction, radiation exposure, metallic taste sensation and  life-threatening complications (estimated to be 1 in 10,000)], benefits (risk stratification, diagnosing coronary artery disease, treatment guidance) and alternatives of a nuclear stress test were discussed in detail with Ms. Kemp and she agrees to proceed.  Adlee Paar E Ronasia Isola, PA-C 06/17/2024 2:04 PM

## 2024-05-08 ENCOUNTER — Other Ambulatory Visit (HOSPITAL_COMMUNITY): Payer: Self-pay

## 2024-05-09 ENCOUNTER — Ambulatory Visit: Admitting: Adult Health

## 2024-05-10 ENCOUNTER — Encounter: Payer: Self-pay | Admitting: Student

## 2024-05-10 ENCOUNTER — Ambulatory Visit: Attending: Student | Admitting: Student

## 2024-05-10 VITALS — BP 97/66 | HR 83 | Ht 67.0 in | Wt 135.0 lb

## 2024-05-10 DIAGNOSIS — I2510 Atherosclerotic heart disease of native coronary artery without angina pectoris: Secondary | ICD-10-CM

## 2024-05-10 DIAGNOSIS — Q2112 Patent foramen ovale: Secondary | ICD-10-CM

## 2024-05-10 DIAGNOSIS — I4819 Other persistent atrial fibrillation: Secondary | ICD-10-CM

## 2024-05-10 DIAGNOSIS — R0609 Other forms of dyspnea: Secondary | ICD-10-CM

## 2024-05-10 DIAGNOSIS — E785 Hyperlipidemia, unspecified: Secondary | ICD-10-CM | POA: Diagnosis not present

## 2024-05-10 NOTE — Patient Instructions (Addendum)
 Thank you for choosing [facility]!     CT information [email or web address]  For non-scheduling related questions, please contact the cardiac imaging nurse navigator should you have any questions/concerns: Cardiac Imaging Nurse Navigators Direct Office Dial: [phone]    Medication Instructions:  No medication changes were made during today's visit.  *If you need a refill on your cardiac medications before your next appointment, please call your pharmacy*   Lab Work: No labs were ordered during today's visit.  If you have labs (blood work) drawn today and your tests are completely normal, you will receive your results only by: MyChart Message (if you have MyChart) OR A paper copy in the mail If you have any lab test that is abnormal or we need to change your treatment, we will call you to review the results.   Testing/Procedures: No procedures were ordered during today's visit.   Your next appointment:   6 month(s)   Provider:   Redell Shallow, MD or Callie Goodrich

## 2024-05-12 ENCOUNTER — Ambulatory Visit: Admitting: Adult Health

## 2024-05-12 DIAGNOSIS — Z1231 Encounter for screening mammogram for malignant neoplasm of breast: Secondary | ICD-10-CM | POA: Diagnosis not present

## 2024-05-12 LAB — HM MAMMOGRAPHY

## 2024-05-16 ENCOUNTER — Ambulatory Visit: Admitting: Adult Health

## 2024-05-16 ENCOUNTER — Encounter: Payer: Self-pay | Admitting: Adult Health

## 2024-05-16 VITALS — BP 112/68 | HR 87 | Temp 96.0°F | Resp 18 | Ht 67.0 in | Wt 137.0 lb

## 2024-05-16 DIAGNOSIS — Z23 Encounter for immunization: Secondary | ICD-10-CM | POA: Diagnosis not present

## 2024-05-16 DIAGNOSIS — Z0000 Encounter for general adult medical examination without abnormal findings: Secondary | ICD-10-CM

## 2024-05-16 NOTE — Patient Instructions (Signed)
  Shelia [name] , Thank you for taking time to come for your Medicare Wellness Visit. I appreciate your ongoing commitment to your health goals. Please review the following plan we discussed and let me know if I can assist you in the future.   These are the goals we discussed:  Goals       Exercise 3x per week (30 min per time) (pt-stated)      Does weights at home, 5 lbs each barbel      Patient Stated      Reduce Alcohol intake         This is a list of the screening recommended for you and due dates:  Health Maintenance  Topic Date Due   Flu Shot  02/26/2024   COVID-19 Vaccine (9 - Pfizer risk 2025-26 season) 10/09/2024   Medicare Annual Wellness Visit  05/16/2025   DTaP/Tdap/Td vaccine (4 - Td or Tdap) 12/26/2027   Pneumococcal Vaccine for age over 72  Completed   DEXA scan (bone density measurement)  Completed   Zoster (Shingles) Vaccine  Completed   Meningitis B Vaccine  Aged Out   Hepatitis C Screening  Discontinued

## 2024-05-16 NOTE — Progress Notes (Signed)
 Subjective:   Shelia [name] is a 83 y.o. female who presents for Medicare Annual (Subsequent) preventive examination.  Visit Complete: In person  Patient Medicare AWV questionnaire was completed by the patient on 05/16/24; I have confirmed that all information answered by patient is correct and no changes since this date.  Cardiac Risk Factors include: advanced age (>75men, >57 women);dyslipidemia;family history of premature cardiovascular disease     Objective:    Today's Vitals   05/16/24 0909 05/16/24 0921  BP: 112/68   Pulse: 87   Resp: 18   Temp: (!) 96 F (35.6 C)   SpO2: 98%   Weight: 137 lb (62.1 kg)   Height: 5' 7 (1.702 m)   PainSc: 0-No pain 0-No pain   Body mass index is 21.46 kg/m.     10/05/2023    3:40 PM 09/17/2023    1:41 PM 09/10/2023    9:09 AM 08/27/2023    2:09 PM 08/25/2023    1:41 PM 08/20/2023    2:29 PM 07/27/2023    9:33 AM  Advanced Directives  Does Patient Have a Medical Advance Directive? Yes Yes Yes Yes No No Yes  Type of Estate agent of [place];Living will;Out of facility DNR (pink MOST or yellow form) Healthcare Power of [place];Living will;Out of facility DNR (pink MOST or yellow form) Healthcare Power of [place];Living will;Out of facility DNR (pink MOST or yellow form) Healthcare Power of [place];Living will Healthcare Power of [place];Living will  Healthcare Power of [place];Living will  Does patient want to make changes to medical advance directive? No - Patient declined No - Patient declined   No - Patient declined  No - Patient declined  Copy of Healthcare Power of Attorney in Chart? Yes - validated most recent copy scanned in chart (See row information) Yes - validated most recent copy scanned in chart (See row information) Yes - validated most recent copy scanned in chart (See row information)  Yes - validated most recent copy scanned in chart (See row information)  Yes - validated most recent copy  scanned in chart (See row information)  Would patient like information on creating a medical advance directive? No - Patient declined No - Patient declined   No - Patient declined No - Patient declined     Current Medications (verified) Outpatient Encounter Medications as of 05/16/2024  Medication Sig   acetaminophen  (TYLENOL ) 500 MG tablet Take 2 tablets (1,000 mg total) by mouth every 8 (eight) hours as needed.   apixaban  (ELIQUIS ) 5 MG TABS tablet Take 1 tablet (5 mg total) by mouth 2 (two) times daily.   Brimonidine Tartrate (LUMIFY) 0.025 % SOLN Place 1 drop into both eyes daily. Red eyes   Carboxymeth-Glyc-Polysorb PF (REFRESH OPTIVE MEGA-3) 0.5-1-0.5 % SOLN Place 1 drop into both eyes 4 (four) times daily as needed (dry eyes). Enhance with flax seed oil   cetirizine  (ZYRTEC ) 10 MG tablet Take 1 tablet (10 mg total) by mouth daily as needed for allergies.   estradiol  (ESTRACE ) 0.1 MG/GM vaginal cream Apply to vaginal area 2 (two) times a week as directed.   hydrocortisone  cream 0.5 % Apply 1 Application topically 2 (two) times daily as needed for itching.   ketotifen (ZADITOR) 0.025 % ophthalmic solution Place 1 drop into both eyes 2 (two) times daily as needed (itchy eyes). In the fall   mineral oil-hydrophilic petrolatum (AQUAPHOR) ointment Apply 1 Application topically as needed for dry skin.   Multiple Vitamin (ONE-DAILY MULTI VITAMINS  PO) Take 30 mLs by mouth daily. Ronal Dines liquid vitamin   OVER THE COUNTER MEDICATION Place 1 application  into both eyes at bedtime. Pure & Clean   psyllium (METAMUCIL) 58.6 % powder Take 1 packet by mouth daily.   rosuvastatin  (CRESTOR ) 10 MG tablet Take 1 tablet (10 mg total) by mouth daily.   triamcinolone cream (KENALOG) 0.1 % Apply 1 Application topically 2 (two) times daily as needed (itching).   Vitamins C E (CRANBERRY URINARY COMFORT PO) Take by mouth.   diltiazem  (CARDIZEM ) 30 MG tablet Take 1 tablet every 4 hours AS NEEDED for AFIB heart  rate >100 as long as top BP >100. (Patient not taking: Reported on 05/16/2024)   No facility-administered encounter medications on file as of 05/16/2024.    Allergies (verified) Patient has no known allergies.   History: Past Medical History:  Diagnosis Date   Anxiety    Arthritis    left knee; left hip; lower back; mild to moderate in right hip (09/30/2016)   Atrial fibrillation (HCC)    Chronic lower back pain    Gastritis    Heart palpitations    History of bone density study 2018   History of mammogram 2021   History of MRI 2018   History of Papanicolaou smear of cervix    Hypercholesteremia    Osteopenia    Seasonal allergies    Past Surgical History:  Procedure Laterality Date   ATRIAL FIBRILLATION ABLATION N/A 06/10/2022   Procedure: ATRIAL FIBRILLATION ABLATION;  Surgeon: Inocencio Soyla Lunger, MD;  Location: [facility] INVASIVE CV LAB;  Service: Cardiovascular;  Laterality: N/A;   CARDIOVERSION N/A 11/17/2019   Procedure: CARDIOVERSION;  Surgeon: Pietro Redell RAMAN, MD;  Location: [facility] ENDOSCOPY;  Service: Cardiovascular;  Laterality: N/A;   CARDIOVERSION N/A 04/16/2021   Procedure: CARDIOVERSION;  Surgeon: Raford Riggs, MD;  Location: [facility] ENDOSCOPY;  Service: Cardiovascular;  Laterality: N/A;   CARDIOVERSION N/A 08/15/2021   Procedure: CARDIOVERSION;  Surgeon: Alvan Ronal BRAVO, MD;  Location: [facility] ENDOSCOPY;  Service: Cardiovascular;  Laterality: N/A;   CARDIOVERSION N/A 05/25/2023   Procedure: CARDIOVERSION;  Surgeon: Mona Vinie BROCKS, MD;  Location: [facility] INVASIVE CV LAB;  Service: Cardiovascular;  Laterality: N/A;   COLONOSCOPY     DILATION AND CURETTAGE OF UTERUS     JOINT REPLACEMENT     TONSILLECTOMY     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   Procedure: TOTAL HIP ARTHROPLASTY ANTERIOR APPROACH;  Surgeon: Yvone Rush, MD;  Location: [facility] OR;  Service: Orthopedics;  Laterality: Left;   Family History  Problem Relation Age of Onset   Hypertension Mother    Cancer Father         lung cancer   Diabetes Sister    Anxiety disorder Daughter    Breast cancer Neg Hx    Social History   Socioeconomic History   Marital status: Married    Spouse name: Not on file   Number of children: 3   Years of education: Not on file   Highest education level: Not on file  Occupational History   Not on file  Tobacco Use   Smoking status: Former    Current packs/day: 2.00    Types: Cigarettes   Smokeless tobacco: Never   Tobacco comments:    Former smoker 07/08/22  Vaping Use   Vaping status: Never Used  Substance and Sexual Activity   Alcohol use: Not Currently   Drug use: Never   Sexual activity: Yes  Other Topics Concern  Not on file  Social History Narrative   Tobacco use, amount per day now: None.   Past tobacco use, amount per day: Maximum 2 packs    How many years did you use tobacco: Quit 1985   Alcohol use (drinks per week): 3   Diet: Vegetables, Chicken, Fish, Grains, and Fruit.   Do you drink/eat things with caffeine: Coffee   Marital status: Married                                  What year were you married? 1969   Do you live in a house, apartment, assisted living, condo, trailer, etc.? House.   Is it one or more stories? 2 stories.   How many persons live in your home? 2   Do you have pets in your home?( please list) No.   Highest Level of education completed? Masters in Adult Educations.   Current or past profession: Art gallery manager    Do you exercise? Yes.                                  Type and how often? Floor excercises 5 times week. Walk 2 miles 6 times week.   Do you have a living will? Yes   Do you have a DNR form?  Yes                                 If not, do you want to discuss one?   Do you have signed POA/HPOA forms?  Yes                      If so, please bring to you appointment      Do you have any difficulty bathing or dressing yourself? No   Do you have any difficulty preparing food or eating? No   Do you have any  difficulty managing your medications? No   Do you have any difficulty managing your finances? No   Do you have any difficulty affording your medications? No   Social Drivers of Corporate investment banker Strain: Not on file  Food Insecurity: No Food Insecurity (05/01/2023)   Hunger Vital Sign    Worried About Running Out of Food in the Last Year: Never true    Ran Out of Food in the Last Year: Never true  Transportation Needs: No Transportation Needs (05/01/2023)   PRAPARE - Administrator, Civil Service (Medical): No    Lack of Transportation (Non-Medical): No  Physical Activity: Not on file  Stress: Stress Concern Present (05/01/2023)   [employer] of Occupational Health - Occupational Stress Questionnaire    Feeling of Stress : To some extent  Social Connections: Unknown (11/12/2022)   Received from [facility]   Social Network    Social Network: Not on file    Tobacco Counseling Counseling given: Not Answered Tobacco comments: Former smoker 07/08/22   Clinical Intake:  Pre-visit preparation completed: No  Pain : No/denies pain Pain Score: 0-No pain                  Activities of Daily Living    05/16/2024    9:12 AM 05/25/2023   10:22 AM  In your present state of health,  do you have any difficulty performing the following activities:  Hearing? 0 0  Vision? 0 0  Difficulty concentrating or making decisions? 0 0  Walking or climbing stairs? 0 0  Dressing or bathing? 0 0  Doing errands, shopping? 0   Preparing Food and eating ? N   Using the Toilet? N   In the past six months, have you accidently leaked urine? Y   Do you have problems with loss of bowel control? N   Managing your Medications? N   Managing your Finances? N   Housekeeping or managing your Housekeeping? N     Patient Care Team: Medina-Vargas, Jereld BROCKS, NP as PCP - General (Internal Medicine) Pietro Redell RAMAN, MD as PCP - Cardiology (Cardiology) Inocencio Soyla Lunger, MD as PCP - Electrophysiology (Cardiology) Johnnye Ade, MD as Consulting Physician (Obstetrics and Gynecology) Pietro Redell RAMAN, MD as Consulting Physician (Cardiology) Vernona Slough, OD (Ophthalmology)  Indicate any recent Medical Services you may have received from other than [facility] providers in the past year (date may be approximate).     Assessment:   This is a routine wellness examination for Shelia [name].  Hearing/Vision screen Hearing Screening - Comments:: No problem with hearing  Vision Screening - Comments:: Patient does wear glasses    Goals Addressed               This Visit's Progress     Exercise 3x per week (30 min per time) (pt-stated)   On track     Does weights at home, 5 lbs each barbel       Depression Screen    05/16/2024    9:03 AM 02/08/2024    8:50 AM 10/19/2023    1:19 PM 09/23/2023    3:14 PM 09/17/2023    1:41 PM 09/10/2023    9:11 AM 09/03/2023    3:31 PM  PHQ 2/9 Scores  PHQ - 2 Score 0 2 0 0 0 0 0  PHQ- 9 Score  10 5        Fall Risk    05/16/2024    9:02 AM 02/08/2024    8:50 AM 10/19/2023    1:19 PM 09/23/2023    3:14 PM 09/17/2023    1:40 PM  Fall Risk   Falls in the past year? 0 1 0 0 0  Number falls in past yr: 0 1 0 0 0  Injury with Fall? 0 0 0 0 0  Risk for fall due to : No Fall Risks History of fall(s) No Fall Risks No Fall Risks No Fall Risks  Follow up Falls evaluation completed Falls evaluation completed Falls evaluation completed Falls evaluation completed;Education provided;Falls prevention discussed Falls evaluation completed    MEDICARE RISK AT HOME: Medicare Risk at Home Any stairs in or around the home?: Yes If so, are there any without handrails?: Yes (working on puting handrails) Home free of loose throw rugs in walkways, pet beds, electrical cords, etc?: Yes Adequate lighting in your home to reduce risk of falls?: Yes Life alert?: No Use of a cane, walker or w/c?: No Grab bars in the bathroom?:  No Shower chair or bench in shower?: No Elevated toilet seat or a handicapped toilet?: No  TIMED UP AND GO:  Was the test performed?  Yes  Length of time to ambulate 10 feet: <10 sec Gait slow and steady without use of assistive device    Cognitive Function:    04/28/2022    3:33 PM 12/03/2020  10:27 AM  MMSE - Mini Mental State Exam  Orientation to time 5 5  Orientation to time comments year 2023, season Fall, date 2nd, day Monday, month October.   Orientation to Place 5 5  Orientation to Place-comments state [place], [place], city [place], facility [employer], provider [phone].   Registration 3 3  Registration-comments apple, table, penny.   Attention/ Calculation 5 5  Recall 2 3  Recall-comments apple, penny, N/A   Language- name 2 objects 2 2  Language- repeat 1 1  Language- follow 3 step command 3 2  Language- read & follow direction 1 1  Write a sentence 1 1  Copy design 0 1  Total score 28 29        05/16/2024    9:03 AM 02/08/2024    8:54 AM 04/23/2021   10:00 AM  6CIT Screen  What Year? 0 points 0 points 0 points  What month? 0 points 0 points 0 points  What time? 0 points 0 points 0 points  Count back from 20 0 points 0 points 0 points  Months in reverse 0 points 0 points 0 points  Repeat phrase 0 points 2 points 0 points  Total Score 0 points 2 points 0 points    Immunizations Immunization History  Administered Date(s) Administered   Fluad Quad(high Dose 65+) 06/03/2022   Fluad Trivalent(High Dose 65+) 05/26/2023   INFLUENZA, HIGH DOSE SEASONAL PF 06/28/2017, 06/16/2019, 06/02/2020   Influenza Split 04/30/2015, 04/20/2016, 04/27/2018, 06/16/2019   Influenza-Unspecified 06/19/2021   Moderna Covid-19 Fall Seasonal Vaccine 38yrs & older 06/08/2023   PFIZER(Purple Top)SARS-COV-2 Vaccination 08/12/2019, 09/02/2019, 05/01/2020, 11/13/2020, 04/12/2021   Pfizer(Comirnaty )Fall Seasonal Vaccine 12 years and older 07/31/2022, 04/11/2024    Pneumococcal Conjugate,unspecified 06/08/2023   Pneumococcal Conjugate-13 09/30/2013   Pneumococcal Polysaccharide-23 07/27/2003, 09/24/2010   RSV,unspecified 03/25/2024   Td 04/26/2001, 12/25/2017   Tdap 09/24/2010   Zoster Recombinant(Shingrix) 11/16/2017, 01/06/2018   Zoster, Live 12/11/2006, 04/27/2018, 06/29/2018    TDAP status: Up to date  Flu Vaccine status: Due, Education has been provided regarding the importance of this vaccine. Advised may receive this vaccine at local pharmacy or Health Dept. Aware to provide a copy of the vaccination record if obtained from local pharmacy or Health Dept. Verbalized acceptance and understanding.  Pneumococcal vaccine status: Due, Education has been provided regarding the importance of this vaccine. Advised may receive this vaccine at local pharmacy or Health Dept. Aware to provide a copy of the vaccination record if obtained from local pharmacy or Health Dept. Verbalized acceptance and understanding.  Covid-19 vaccine status: Information provided on how to obtain vaccines.   Qualifies for Shingles Vaccine? Yes   Zostavax completed Yes   Shingrix Completed?: Yes  Screening Tests Health Maintenance  Topic Date Due   Influenza Vaccine  02/26/2024   COVID-19 Vaccine (9 - Pfizer risk 2025-26 season) 10/09/2024   Medicare Annual Wellness (AWV)  05/16/2025   DTaP/Tdap/Td (4 - Td or Tdap) 12/26/2027   Pneumococcal Vaccine: 50+ Years  Completed   DEXA SCAN  Completed   Zoster Vaccines- Shingrix  Completed   Meningococcal B Vaccine  Aged Out   Hepatitis C Screening  Discontinued    Health Maintenance  Health Maintenance Due  Topic Date Due   Influenza Vaccine  02/26/2024    Colorectal cancer screening: No longer required.   Mammogram status: Completed 05/18/23. Repeat every year  Bone Density status: Completed 09/08/16. Results reflect: Bone density results: OSTEOPENIA. Repeat every 0 years.  Lung Cancer Screening: (Low Dose CT Chest  recommended if Age 44-80 years, 20 pack-year currently smoking OR have quit w/in 15years.) does not qualify.   Lung Cancer Screening Referral: N/A  Additional Screening:  Hepatitis C Screening: does not qualify; Completed Not done  Vision Screening: Recommended annual ophthalmology exams for early detection of glaucoma and other disorders of the eye. Is the patient up to date with their annual eye exam?  Yes  Who is the provider or what is the name of the office in which the patient attends annual eye exams? Dr. Jon Likes If pt is not established with a provider, would they like to be referred to a provider to establish care? No .   Dental Screening: Recommended annual dental exams for proper oral hygiene  Diabetic Foot Exam: Diabetic Foot Exam: Completed N/A  Community Resource Referral / Chronic Care Management: CRR required this visit?  No   CCM required this visit?  No     Plan:     I have personally reviewed and noted the following in the patient's chart:   Medical and social history Use of alcohol, tobacco or illicit drugs  Current medications and supplements including opioid prescriptions. Patient is not currently taking opioid prescriptions. Functional ability and status Nutritional status Physical activity Advanced directives List of other physicians Hospitalizations, surgeries, and ER visits in previous 12 months Vitals Screenings to include cognitive, depression, and falls Referrals and appointments  In addition, I have reviewed and discussed with patient certain preventive protocols, quality metrics, and best practice recommendations. A written personalized care plan for preventive services as well as general preventive health recommendations were provided to patient.     Godric Lavell Medina-Vargas, NP   05/16/2024   After Visit Summary: (In Person-Printed) AVS printed and given to the patient  Nurse Notes: Pls schedule your next years AWV.

## 2024-05-19 DIAGNOSIS — M2041 Other hammer toe(s) (acquired), right foot: Secondary | ICD-10-CM | POA: Diagnosis not present

## 2024-05-19 DIAGNOSIS — M21612 Bunion of left foot: Secondary | ICD-10-CM | POA: Diagnosis not present

## 2024-05-19 DIAGNOSIS — M21611 Bunion of right foot: Secondary | ICD-10-CM | POA: Diagnosis not present

## 2024-05-19 DIAGNOSIS — D2371 Other benign neoplasm of skin of right lower limb, including hip: Secondary | ICD-10-CM | POA: Diagnosis not present

## 2024-05-23 DIAGNOSIS — D329 Benign neoplasm of meninges, unspecified: Secondary | ICD-10-CM | POA: Diagnosis not present

## 2024-05-23 NOTE — Progress Notes (Signed)
 [facility]  ([place]) [address] [place], [place] [address]    Date of Service: 05/23/2024  Patient Name: Shelia [name]      MRN: [id number]      Date of Birth: 1940-09-15 Primary Care Physician: Roxan JAYSON Plough, FNP Referring Provider: Plough Roxan JAYSON, FNP  Chief Complaint  Patient presents with  . Follow-up    ASSESSMENT AND PLAN: 83 y.o. female presenting for follow up Neurosurgical evaluation found to have a left frontal convexity calcified meningioma during work-up for posttraumatic headache s/p fall. She continues to be asymptomatic from this incidental finding. Recent surveillance brain MRI confirms no change in the size of the meningioma in comparison to MRI from last year.  We will continue to follow-up with surveillance brain MRI, next planned for Aug 2026.  I discussed that I typically follow with surveillance imaging for at least 3-5 years and if no change during this timeframe, recommend additional MRI at that point for change in exam.  She is agreeable with this plan of care and will follow-up with me after the repeat MRI next year.  HISTORY OF PRESENT ILLNESS: 83 y.o. female presenting for an initial neurosurgical evaluation, at the kind request of [name], Dinah C, FNP  secondary to recently diagnosed intracranial meningioma. Per patient report and chart history she tripped and fell on 2 separate occasions while at her daughter's house in March 2024.  She describes that a low lying doggy gate type room divider had been installed in one of the rooms.  She wasn't used to it being there and when walking over the threshold, tripped and fell into the kitchen, landing onto the kitchen floor.  She says she fell the same way twice a week apart and hit her head on the subsequent fall.  She denies falling in her own home and says she hadn't fallen in 3 years prior to these incidents.  She blames the way the doggy gate was designed, describing it  as being black in color and blending with the floor such that it posed a high falls risk.  Her daughter has since removed the device when the patient is visiting.  The patient is anticoagulated on Eliquis  due to history of atrial fibrillation and given the head injury followed up with her PCP with documented bruising of her left eye, right hand pain, rib pain, and occasional headaches. A head CT was obtained negative for acute ICH, but it did reveal a small left convexity meningioma. The patient was subsequently referred to Neurology who obtained a follow-up brain MRI.  Serial surveillance imaging was recommended in addition to Neurosurgical evaluation.  12/08/2022: Today in Clinic she says her headaches have essentially resolved, describing a single episode last week when she felt a pressure in her head at nighttime which resolved after several minutes without the need for medication.  She acknowledges chronic balance disturbance present prior to the fall, recalling that when she first goes from the seated to standing/walking position she feels unsteady, denying dizziness or the sensation that the room is spinning.  She does not require use of a cane or walker, and does participate in a routine walking program up to 2 miles/day.  She also acknowledges mild memory impairment which she started noting last year, giving the example of having some word-finding difficulty.  She denies N/V, visual changes, or lateralizing weakness/numbness associated with the fall.  05/23/2024: She says she continues to be in good health, no  new headaches or visual issues. She did have some sinus headaches recently, these have resolved. She is here today to review recent MRI head.   MEDICAL DECISION MAKING: Results for orders placed in visit on 04/28/24  MRI Head WO W Contrast  Narrative MRI HEAD WO W CONTRAST  INDICATION: Known meningioma; annual surveillance study  COMPARISON: February 26, 2023  TECHNIQUE: Imaging of  the brain was performed in 3 planes utilizing a combination of T1, FLAIR, and T2 weighting. Diffusion images were performed. In addition, sagittal and axial T1-weighted sequences were performed after intravenous injection of contrast. CONTRAST: 6 mL of GADOBUTROL  1 MMOL/ML IV SOLN.  FINDINGS: Stable meningioma along the left frontal convexity measuring 1.6 x 1.1 cm. No abnormally restricted diffusion to suggest acute or subacute infarction.  Impression No hydrocephalus. The larger intracranial vessels demonstrate normal flow voids. Stable T2 hyperintense mass along the right cerebellopontine angle cistern has heterogeneous areas of restricted diffusion and measures approximately 1.5 x 0.7 cm most compatible with an epidermoid cyst. Patchy periventricular and deep white matter T2/FLAIR hyperintensities are nonspecific but compatible with mild chronic microangiopathic changes.  Moderate mucosal thickening with aerated debris within the left maxillary sinus, nonspecific but can be seen with acute sinusitis. The mastoid air cells are clear. Normal marrow signal pattern.   IMPRESSION: 1.  Stable meningioma along the left frontal convexity measuring 1.6 x 1.1 cm. 2.  Stable T2 hyperintense mass along the right cerebellopontine angle cistern has heterogeneous areas of restricted diffusion and measures approximately 1.5 x 0.7 cm most compatible with an epidermoid cyst. 3.  Sinus findings as above.  Electronically Signed by: Valma Companion, MD on 05/03/2024 3:15 PM     History reviewed. No pertinent past medical history. History reviewed. No pertinent surgical history. History reviewed. No pertinent family history. Social History   Tobacco Use  Smoking Status Never  Smokeless Tobacco Never   Allergies  Allergen Reactions  . Nsaids Other and Unknown    Caused painful stomach problems    Current Outpatient Medications  Medication Sig Dispense Refill  . acetaminophen  (TYLENOL ) 325 mg  tablet Take one tablet (325 mg dose) by mouth every 6 (six) hours as needed.    . amoxicillin  (AMOXIL ) 500 mg capsule Take 4 capsules by mouth 1 hour prior to appointment.    . Brimonidine Tartrate (LUMIFY) 0.025 % SOLN Apply 1 drop to eye.    . Carboxymeth-Glyc-Polysorb PF (REFRESH OPTIVE MEGA-3) 0.5-1-0.5 % SOLN Apply 1 drop to eye 4 (four) times a day as needed.    . diphenhydraMINE -acetaminophen  (TYLENOL  PM) 25-500 MG TABS Take one tablet by mouth.    . ELIQUIS  2.5 MG tablet     . fish oil-omega-3 fatty acids 1000 MG capsule Take three capsules (3 g dose) by mouth daily.    SABRA ketotifen fumarate (ZADITOR,EYE ITCH RELIEF) 0.035 % ophthalmic solution Apply one drop to eye 3 (three) times a week.    . loratadine (CLARITIN) 10 MG tablet Take one tablet (10 mg dose) by mouth daily as needed.    . melatonin 3 mg TABS Take one tablet (3 mg dose) by mouth.    . NON FORMULARY Take 1 tablet by mouth daily.    . rosuvastatin  calcium  (CRESTOR ) 10 mg tablet Take one tablet (10 mg dose) by mouth daily.    . simethicone (GAS-X) 80 mg chewable tablet Chew 1 capsule by mouth daily as needed.     No current facility-administered medications for this visit.  REVIEW OF SYSTEMS: The following systems are noncontributory including constitutional, eyes, ears, nose, mouth, throat, cardiovascular, respiratory, gastrointestinal, genitourinary, integumentary, musculoskeletal, psychiatric, endocrine, hematologic/lymphatic, and allergic/immunologic. Neurological positive for presenting condition.  PHYSICAL EXAM: Blood pressure 101/73, pulse 73, temperature 96.8 F (36 C), temperature source Temporal, height 5' 6 (1.676 m), weight 135 lb (61.2 kg). Description: Caucasian female Body mass index is 21.79 kg/m. Alert, oriented, thought content appropriate; Speech is fluent with intact naming and intact repetition; EOMI with conjugate gaze; Pupils midline and isocoric; Head: Normocephalic, no lesions, without obvious  abnormality. Eye: Normal external eye, conjunctiva, lids cornea, PERL. Nose: Normal external nose, mucus membranes and septum.; no pronator drift; no dysmetria with finger-to-nose; Mental Status: yes follows commands bilateral; Full power bilateral; Sensation: intact to light touch x 4 ext; Gait:  Normal and able to tandem gait heel-to-toe  VISIT DIAGNOSIS: Meningioma (*) [D32.9]  1. Meningioma (*)  MRI Head WO W Contrast     ORDERS FOR THIS VISIT: Requested Prescriptions    No prescriptions requested or ordered in this encounter   Orders Placed This Encounter  Procedures  . MRI Head WO W Contrast   Patient seen in conjunction with Advanced Practice Provider. Rico Jasmine, FNP assisted in the history and physical exam process as well as helped to formulate the assessment and plan.  Her assistance was necessary to facilitate patient care and education.

## 2024-05-26 ENCOUNTER — Encounter: Payer: Self-pay | Admitting: Adult Health

## 2024-05-26 ENCOUNTER — Ambulatory Visit (INDEPENDENT_AMBULATORY_CARE_PROVIDER_SITE_OTHER): Admitting: Adult Health

## 2024-05-26 VITALS — BP 126/78 | HR 74 | Temp 96.8°F | Resp 18 | Ht 67.0 in | Wt 138.0 lb

## 2024-05-26 DIAGNOSIS — E785 Hyperlipidemia, unspecified: Secondary | ICD-10-CM

## 2024-05-26 DIAGNOSIS — D329 Benign neoplasm of meninges, unspecified: Secondary | ICD-10-CM | POA: Diagnosis not present

## 2024-05-26 DIAGNOSIS — I480 Paroxysmal atrial fibrillation: Secondary | ICD-10-CM

## 2024-05-26 DIAGNOSIS — R1310 Dysphagia, unspecified: Secondary | ICD-10-CM

## 2024-05-26 NOTE — Progress Notes (Signed)
 [facility] clinic  Provider:  Jereld Serum DNP  Code Status:   Full Code  Goals of Care:     10/05/2023    3:40 PM  Advanced Directives  Does Patient Have a Medical Advance Directive? Yes  Type of Estate Agent of [place];Living will;Out of facility DNR (pink MOST or yellow form)  Does patient want to make changes to medical advance directive? No - Patient declined  Copy of Healthcare Power of Attorney in Chart? Yes - validated most recent copy scanned in chart (See row information)  Would patient like information on creating a medical advance directive? No - Patient declined     Chief Complaint  Patient presents with   Medical Management of Chronic Issues    3 months    Discussed the use of AI scribe software for clinical note transcription with the patient, who gave verbal consent to proceed.  HPI: Patient is a 83 y.o. female seen today for a 80-month follow up of chronic medical issues.  She has been experiencing difficulty swallowing for some time, requiring increased effort to swallow. Despite this, she has not experienced any choking episodes, even with water, which she finds helpful. She has not previously been evaluated by a gastroenterologist or an ENT specialist for this issue.  She experiences anxiety, particularly about the future, but it does not interfere with her daily activities. She is not on any medication for anxiety.  She has a history of atrial fibrillation, managed with Eliquis  5 mg twice daily. She also has diltiazem  30 mg as needed for palpitation, though she has not used it recently.  She has hyperlipidemia, controlled with rosuvastatin  10 mg daily. Her last LDL was 73, within the target range. She maintains a diet that includes vegetables and takes Metamucil daily to aid with cholesterol management.  She has a history of a stable meningioma, monitored by a neurologist. She reports occasional headaches, managed with Tylenol  as  needed.  She engages in regular physical activity, attending a fitness center three times a week for about an hour each session.      Past Medical History:  Diagnosis Date   Anxiety    Arthritis    left knee; left hip; lower back; mild to moderate in right hip (09/30/2016)   Atrial fibrillation (HCC)    Chronic lower back pain    Gastritis    Heart palpitations    History of bone density study 2018   History of mammogram 2021   History of MRI 2018   History of Papanicolaou smear of cervix    Hypercholesteremia    Osteopenia    Seasonal allergies     Past Surgical History:  Procedure Laterality Date   ATRIAL FIBRILLATION ABLATION N/A 06/10/2022   Procedure: ATRIAL FIBRILLATION ABLATION;  Surgeon: Inocencio Soyla Lunger, MD;  Location: [facility] INVASIVE CV LAB;  Service: Cardiovascular;  Laterality: N/A;   CARDIOVERSION N/A 11/17/2019   Procedure: CARDIOVERSION;  Surgeon: Pietro Redell RAMAN, MD;  Location: [facility] ENDOSCOPY;  Service: Cardiovascular;  Laterality: N/A;   CARDIOVERSION N/A 04/16/2021   Procedure: CARDIOVERSION;  Surgeon: Raford Riggs, MD;  Location: [facility] ENDOSCOPY;  Service: Cardiovascular;  Laterality: N/A;   CARDIOVERSION N/A 08/15/2021   Procedure: CARDIOVERSION;  Surgeon: Alvan Ronal BRAVO, MD;  Location: [facility] ENDOSCOPY;  Service: Cardiovascular;  Laterality: N/A;   CARDIOVERSION N/A 05/25/2023   Procedure: CARDIOVERSION;  Surgeon: Mona Vinie BROCKS, MD;  Location: [facility] INVASIVE CV LAB;  Service: Cardiovascular;  Laterality: N/A;  COLONOSCOPY     DILATION AND CURETTAGE OF UTERUS     JOINT REPLACEMENT     TONSILLECTOMY     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   Procedure: TOTAL HIP ARTHROPLASTY ANTERIOR APPROACH;  Surgeon: Yvone Rush, MD;  Location: [facility] OR;  Service: Orthopedics;  Laterality: Left;    No Known Allergies  Outpatient Encounter Medications as of 05/26/2024  Medication Sig   acetaminophen  (TYLENOL ) 500 MG tablet Take 2 tablets (1,000 mg total) by mouth every 8  (eight) hours as needed.   apixaban  (ELIQUIS ) 5 MG TABS tablet Take 1 tablet (5 mg total) by mouth 2 (two) times daily.   Brimonidine Tartrate (LUMIFY) 0.025 % SOLN Place 1 drop into both eyes daily. Red eyes   Carboxymeth-Glyc-Polysorb PF (REFRESH OPTIVE MEGA-3) 0.5-1-0.5 % SOLN Place 1 drop into both eyes 4 (four) times daily as needed (dry eyes). Enhance with flax seed oil   cetirizine  (ZYRTEC ) 10 MG tablet Take 1 tablet (10 mg total) by mouth daily as needed for allergies.   estradiol  (ESTRACE ) 0.1 MG/GM vaginal cream Apply to vaginal area 2 (two) times a week as directed.   hydrocortisone  cream 0.5 % Apply 1 Application topically 2 (two) times daily as needed for itching.   ketotifen (ZADITOR) 0.025 % ophthalmic solution Place 1 drop into both eyes 2 (two) times daily as needed (itchy eyes). In the fall   mineral oil-hydrophilic petrolatum (AQUAPHOR) ointment Apply 1 Application topically as needed for dry skin.   Multiple Vitamin (ONE-DAILY MULTI VITAMINS PO) Take 30 mLs by mouth daily. Ronal Dines liquid vitamin   OVER THE COUNTER MEDICATION Place 1 application  into both eyes at bedtime. Pure & Clean   psyllium (METAMUCIL) 58.6 % powder Take 1 packet by mouth daily.   rosuvastatin  (CRESTOR ) 10 MG tablet Take 1 tablet (10 mg total) by mouth daily.   triamcinolone cream (KENALOG) 0.1 % Apply 1 Application topically 2 (two) times daily as needed (itching).   Vitamins C E (CRANBERRY URINARY COMFORT PO) Take by mouth.   diltiazem  (CARDIZEM ) 30 MG tablet Take 1 tablet every 4 hours AS NEEDED for AFIB heart rate >100 as long as top BP >100. (Patient not taking: Reported on 05/26/2024)   No facility-administered encounter medications on file as of 05/26/2024.    Review of Systems:  Review of Systems  Constitutional:  Negative for appetite change, chills, fatigue and fever.  HENT:  Negative for congestion, hearing loss, rhinorrhea and sore throat.   Eyes: Negative.   Respiratory:  Negative for  cough, shortness of breath and wheezing.   Cardiovascular:  Negative for chest pain, palpitations and leg swelling.  Gastrointestinal:  Negative for abdominal pain, constipation, diarrhea, nausea and vomiting.  Genitourinary:  Negative for dysuria.  Musculoskeletal:  Negative for arthralgias, back pain and myalgias.  Skin:  Negative for color change, rash and wound.  Neurological:  Negative for dizziness, weakness and headaches.  Psychiatric/Behavioral:  Negative for behavioral problems. The patient is not nervous/anxious.     Health Maintenance  Topic Date Due   COVID-19 Vaccine (9 - Pfizer risk 2025-26 season) 10/09/2024   Medicare Annual Wellness (AWV)  05/16/2025   DTaP/Tdap/Td (4 - Td or Tdap) 12/26/2027   Pneumococcal Vaccine: 50+ Years  Completed   Influenza Vaccine  Completed   DEXA SCAN  Completed   Zoster Vaccines- Shingrix  Completed   Meningococcal B Vaccine  Aged Out   Hepatitis C Screening  Discontinued    Physical Exam: Vitals:  05/26/24 0925  BP: 126/78  Pulse: 74  Resp: 18  Temp: (!) 96.8 F (36 C)  SpO2: 94%  Weight: 138 lb (62.6 kg)  Height: 5' 7 (1.702 m)   Body mass index is 21.61 kg/m. Physical Exam Constitutional:      Appearance: Normal appearance.  HENT:     Head: Normocephalic and atraumatic.     Nose: Nose normal.     Mouth/Throat:     Mouth: Mucous membranes are moist.  Eyes:     Conjunctiva/sclera: Conjunctivae normal.  Cardiovascular:     Rate and Rhythm: Normal rate. Rhythm irregular.  Pulmonary:     Effort: Pulmonary effort is normal.     Breath sounds: Normal breath sounds.  Abdominal:     General: Bowel sounds are normal.     Palpations: Abdomen is soft.  Musculoskeletal:        General: Normal range of motion.     Cervical back: Normal range of motion.  Skin:    General: Skin is warm and dry.  Neurological:     General: No focal deficit present.     Mental Status: She is alert and oriented to person, place, and time.   Psychiatric:        Mood and Affect: Mood normal.        Behavior: Behavior normal.        Thought Content: Thought content normal.        Judgment: Judgment normal.     Labs reviewed: Basic Metabolic Panel: Recent Labs    08/11/23 0856 08/20/23 1645 08/27/23 1524 02/04/24 1418 02/23/24 1705  NA 145   < > 143 142 139  K 4.2   < > 4.0 4.3 4.1  CL 108   < > 108 106 105  CO2 30   < > 25 23 26   GLUCOSE 74   < > 97 87 106*  BUN 26*   < > 19 25 21   CREATININE 0.77   < > 0.73 0.76 0.78  CALCIUM  9.2   < > 9.5 9.1 9.3  TSH 3.37  --   --   --   --    < > = values in this interval not displayed.   Liver Function Tests: Recent Labs    08/20/23 1645 08/27/23 1524 10/05/23 1612 02/04/24 1418  AST 33 40 29 48*  ALT 28 23 19  37*  ALKPHOS 60 52  --  76  BILITOT 1.0 1.2 0.6 0.6  PROT 6.3* 6.8 5.7* 6.0  ALBUMIN 4.5 4.4  --  4.4   No results for input(s): LIPASE, AMYLASE in the last 8760 hours. No results for input(s): AMMONIA in the last 8760 hours. CBC: Recent Labs    08/11/23 0856 08/20/23 1645 08/27/23 1524 02/04/24 1418 02/23/24 1705 02/24/24 0542  WBC 3.8 6.5 5.5 4.2 4.8 5.0  NEUTROABS 1,588 4.5 3.0  --   --   --   HGB 14.3 14.9 15.0 14.1 14.5 13.7  HCT 43.4 45.1 45.7 43.2 42.8 40.8  MCV 98.0 97.0 100.7* 101* 98.8 99.0  PLT 150 152 146* 132* 119* 104*   Lipid Panel: Recent Labs    08/11/23 0856 02/04/24 1418  CHOL 153 147  HDL 57 62  LDLCALC 82 73  TRIG 67 55  CHOLHDL 2.7 2.4   No results found for: HGBA1C  Procedures since last visit: No results found.  Assessment/Plan  1. Dysphagia, unspecified type (Primary) -  Chronic difficulty swallowing, likely age-related muscle  slowing.  - Consider gastroenterologist referral  - Ambulatory referral to Gastroenterology  2. Hyperlipidemia, unspecified hyperlipidemia type Lab Results  Component Value Date   CHOL 147 02/04/2024   HDL 62 02/04/2024   LDLCALC 73 02/04/2024   TRIG 55 02/04/2024    CHOLHDL 2.4 02/04/2024    -   Well controlled with rosuvastatin . LDL within target range. - Continue rosuvastatin  10 mg daily.  3. Paroxysmal A-fib (HCC) -  Well controlled with Eliquis . Heart rate stable. No recent episodes. - Continue Eliquis  5 mg twice daily. - Use diltiazem  if heart rate exceeds 100 bpm.  4. Meningioma (HCC) -  No symptoms requiring intervention. Occasional headaches managed with Tylenol . - Continue monitoring with neurologist. - Use Tylenol  as needed for headaches.      Labs/tests ordered:   None   Return in about 6 months (around 11/24/2024).  Joelys Staubs Medina-Vargas, NP

## 2024-06-12 ENCOUNTER — Other Ambulatory Visit (HOSPITAL_COMMUNITY): Payer: Self-pay

## 2024-06-16 ENCOUNTER — Telehealth: Payer: Self-pay | Admitting: Cardiology

## 2024-06-16 DIAGNOSIS — R0609 Other forms of dyspnea: Secondary | ICD-10-CM

## 2024-06-16 NOTE — Telephone Encounter (Signed)
 Pt requesting to have a stress test. Please advise.

## 2024-06-16 NOTE — Telephone Encounter (Signed)
 Spoke with the patient who states that she would like to proceed with the stress test that was discussed with Callie Goodrich at her last office visit. She states that she would prefer a treadmill test vs lexiscan. See below notes from Callie:   - Discussed dyspnea as a possible anginal equivalent. Discussed non-invasive ischemic testing with nuclear stress test or coronary CTA (my preference would be a coronary CTA). She would like to think about this and discuss with a family member who is a physician. She will let us  know if she decides to proceed with one of these options. I did go ahead and discuss risks and benefits of both with her.

## 2024-06-17 ENCOUNTER — Telehealth (HOSPITAL_COMMUNITY): Payer: Self-pay | Admitting: Radiology

## 2024-06-17 NOTE — Telephone Encounter (Signed)
 Yes ma'am, okay to order the exercise Myoview. I will go ahead and put in the order and sign the attestation.  We can try a treadmill myoview but may have to switch to Lesixcan if unable to meet heart rate goal.  Thank you! Treysean Petruzzi

## 2024-06-17 NOTE — Telephone Encounter (Signed)
 Patient given detailed instructions per Myocardial Perfusion Study Information Sheet for the test on 12/2 at 10:15. Patient notified to arrive 15 minutes early and that it is imperative to arrive on time for appointment to keep from having the test rescheduled.  If you need to cancel or reschedule your appointment, please call the office within 24 hours of your appointment. . Patient verbalized understanding.EK

## 2024-06-21 ENCOUNTER — Telehealth: Payer: Self-pay | Admitting: Cardiology

## 2024-06-21 ENCOUNTER — Encounter: Payer: Self-pay | Admitting: Adult Health

## 2024-06-21 NOTE — Telephone Encounter (Signed)
 Patient is under the care of cardiology.

## 2024-06-21 NOTE — Telephone Encounter (Signed)
   Returned call from after-hours a patient service line.  Patient states that she has a Kardia mobile device and notes that it told her a few days prior she was in A-fib.  She is currently out shopping in a department store, so she is unsure of the exact heart rate but it seems documented that she was going 90 bpm.  She was asymptomatic with this.  She says that she only is aware of this because of the report from her Kardia mobile.  She denies any prior or current chest pain, shortness of breath, palpitations, dizziness, lightheadedness, presyncope, syncope.  She states that she is comfortable being seen in the outpatient setting as she does not have any symptoms.  She notes that she has a stress test scheduled December 2 and she would like to be seen before then, the only availability as the patient is going out of town and is being seen the morning of December 2 in our atrial fibrillation clinic prior to having undergo her nuclear stress test.  We discussed strict ER precautions and patient is aware of when she should go to the emergency department.  All questions were answered.  Patient expressed understanding.  Follow-up was made with atrial fibrillation clinic 8:30 AM 06/28/2024.  Waddell DELENA Donath, PA-C 06/21/2024 5:49 PM

## 2024-06-21 NOTE — Telephone Encounter (Signed)
 Pls follow up with cardiology ASAP.

## 2024-06-22 ENCOUNTER — Telehealth (HOSPITAL_COMMUNITY): Payer: Self-pay

## 2024-06-27 ENCOUNTER — Other Ambulatory Visit: Payer: Self-pay | Admitting: Student

## 2024-06-27 DIAGNOSIS — R0609 Other forms of dyspnea: Secondary | ICD-10-CM

## 2024-06-28 ENCOUNTER — Ambulatory Visit (INDEPENDENT_AMBULATORY_CARE_PROVIDER_SITE_OTHER)
Admission: RE | Admit: 2024-06-28 | Discharge: 2024-06-28 | Disposition: A | Discharge status: Home / Self Care | Source: Ambulatory Visit | Attending: Internal Medicine | Admitting: Internal Medicine

## 2024-06-28 ENCOUNTER — Encounter (HOSPITAL_COMMUNITY): Payer: Self-pay | Admitting: Internal Medicine

## 2024-06-28 ENCOUNTER — Ambulatory Visit: Payer: Self-pay | Admitting: Student

## 2024-06-28 ENCOUNTER — Ambulatory Visit (HOSPITAL_COMMUNITY)
Admission: RE | Admit: 2024-06-28 | Discharge: 2024-06-28 | Disposition: A | Source: Ambulatory Visit | Attending: Cardiology | Admitting: Cardiology

## 2024-06-28 VITALS — BP 110/82 | HR 83 | Ht 67.0 in | Wt 141.2 lb

## 2024-06-28 DIAGNOSIS — I4819 Other persistent atrial fibrillation: Secondary | ICD-10-CM | POA: Insufficient documentation

## 2024-06-28 DIAGNOSIS — R0609 Other forms of dyspnea: Secondary | ICD-10-CM | POA: Insufficient documentation

## 2024-06-28 DIAGNOSIS — D6869 Other thrombophilia: Secondary | ICD-10-CM | POA: Insufficient documentation

## 2024-06-28 LAB — MYOCARDIAL PERFUSION IMAGING
LV dias vol: 54 mL (ref 46–106)
LV sys vol: 6 mL (ref 3.8–5.2)
Nuc Stress EF: 89 %
Peak HR: 96 {beats}/min
Rest HR: 76 {beats}/min
Rest Nuclear Isotope Dose: 10.6 mCi
SDS: 0
SRS: 2
SSS: 1
ST Depression (mm): 0 mm
Stress Nuclear Isotope Dose: 32.2 mCi
TID: 0.97

## 2024-06-28 MED ORDER — TECHNETIUM TC 99M TETROFOSMIN IV KIT
10.6000 | PACK | Freq: Once | INTRAVENOUS | Status: AC | PRN
  Administered 2024-06-28: 10.6 via INTRAVENOUS

## 2024-06-28 MED ORDER — TECHNETIUM TC 99M TETROFOSMIN IV KIT
32.2000 | PACK | Freq: Once | INTRAVENOUS | Status: AC | PRN
  Administered 2024-06-28: 32.2 via INTRAVENOUS

## 2024-06-28 MED ORDER — REGADENOSON 0.4 MG/5ML IV SOLN
INTRAVENOUS | Status: AC
  Filled 2024-06-28: qty 5

## 2024-06-28 MED ORDER — REGADENOSON 0.4 MG/5ML IV SOLN
0.4000 mg | Freq: Once | INTRAVENOUS | Status: AC
  Administered 2024-06-28: 0.4 mg via INTRAVENOUS

## 2024-06-28 NOTE — Patient Instructions (Signed)
 Cardioversion scheduled for: 07/22/24 6:30 am    - Arrive at the [facility] ([address])  and check in with ADMITTING at 6:30am   - Do not eat or drink anything after midnight the night prior to your procedure.   - Take all your morning medication (except diabetic medications) with a sip of water prior to arrival.  - Do NOT miss any doses of your blood thinner - if you should miss a dose or take a dose more than 4 hours late -- please notify our office immediately.  - You will not be able to drive home after your procedure. Please ensure you have a responsible adult to drive you home. You will need someone with you for 24 hours post procedure.     - Expect to be in the procedural area approximately 2 hours.   - If you feel as if you go back into normal rhythm prior to scheduled cardioversion, please notify our office immediately.   If your procedure is canceled in the cardioversion suite you will be charged a cancellation fee.

## 2024-06-28 NOTE — Progress Notes (Signed)
 Primary Care Physician: Phyllis Jereld BROCKS, NP Primary Cardiologist: Dr Pietro Primary Electrophysiologist: Dr Inocencio Referring Physician: Lesley triage    Shelia [name] is a 83 y.o. female with a history of HLD, CAD, atrial fibrillation who presents for follow up in the [facility].  The patient was initially diagnosed with atrial fibrillation early 2021 and underwent DCCV on 11/17/19. She had recurrent symptoms of persistent afib and had repeat DCCV on 04/16/21. Patient is on Eliquis  for a CHADS2VASC score of 4. She presented to urgent care 07/24/21 with palpitations and mild SOB. ECG showed rate controlled afib. There were no specific triggers that she could identify. Patient is s/p DCCV on 08/15/21.   Patient is s/p afib ablation with Dr Inocencio on 06/10/22.   On follow up today, patient reports that about one week ago she noticed more fatigue and SOB with exertion. She has also had some intermittent dizziness. She checked her Crist mobile two days ago which showed afib. There were no specific triggers that she could identify. She did miss a dose of Eliquis  but is sure she has not missed any since 8/27.  On follow up 05/20/23, she is currently in Afib. She feels SOB with exertion when in Afib. She missed a dose of Eliquis  on 9/17 and DCCV was rescheduled to 10/28. She has not missed any more doses of Eliquis  since then.    On follow up 05/29/23, she is currently in NSR. S/p successful DCCV on 05/25/23. Unfortunately, patient contacted clinic on 10/30 noting she went back into Afib and was prescribed PRN diltiazem . She did convert to normal rhythm after taking one pill of PRN diltiazem .   On follow-up 06/28/2024, patient is currently in A-fib.  She contacted the clinic on 11/25 noting via Kardia mobile device she was back in A-fib.  She has missed a few doses of Eliquis  and cannot specifically recall when she was last compliant other than  today.  Today, she denies symptoms of palpitations, chest pain, orthopnea, PND, lower extremity edema, presyncope, syncope, snoring, daytime somnolence, bleeding, or neurologic sequela. The patient is tolerating medications without difficulties and is otherwise without complaint today.    Atrial Fibrillation Risk Factors:  she does not have symptoms or diagnosis of sleep apnea. she does not have a history of rheumatic fever. she does not have a history of alcohol use.   Atrial Fibrillation Management history:  Previous antiarrhythmic drugs: none Previous cardioversions: 11/17/19, 04/16/21, 05/25/23 Previous ablations: 06/10/22 Anticoagulation history: Eliquis    Past Medical History:  Diagnosis Date   Anxiety    Arthritis    left knee; left hip; lower back; mild to moderate in right hip (09/30/2016)   Atrial fibrillation (HCC)    Chronic lower back pain    Gastritis    Heart palpitations    History of bone density study 2018   History of mammogram 2021   History of MRI 2018   History of Papanicolaou smear of cervix    Hypercholesteremia    Osteopenia    Seasonal allergies     Current Outpatient Medications  Medication Sig Dispense Refill   acetaminophen  (TYLENOL ) 500 MG tablet Take 2 tablets (1,000 mg total) by mouth every 8 (eight) hours as needed.     apixaban  (ELIQUIS ) 5 MG TABS tablet Take 1 tablet (5 mg total) by mouth 2 (two) times daily. 60 tablet 5   Brimonidine Tartrate (LUMIFY) 0.025 % SOLN Place 1 drop into both eyes daily. Red  eyes     Carboxymeth-Glyc-Polysorb PF (REFRESH OPTIVE MEGA-3) 0.5-1-0.5 % SOLN Place 1 drop into both eyes 4 (four) times daily as needed (dry eyes). Enhance with flax seed oil     cetirizine  (ZYRTEC ) 10 MG tablet Take 1 tablet (10 mg total) by mouth daily as needed for allergies.     diltiazem  (CARDIZEM ) 30 MG tablet Take 1 tablet every 4 hours AS NEEDED for AFIB heart rate >100 as long as top BP >100. 30 tablet 1   estradiol  (ESTRACE )  0.1 MG/GM vaginal cream Apply to vaginal area 2 (two) times a week as directed. 42.5 g 1   hydrocortisone  cream 0.5 % Apply 1 Application topically 2 (two) times daily as needed for itching.     ketotifen (ZADITOR) 0.025 % ophthalmic solution Place 1 drop into both eyes 2 (two) times daily as needed (itchy eyes). In the fall     mineral oil-hydrophilic petrolatum (AQUAPHOR) ointment Apply 1 Application topically as needed for dry skin.     Multiple Vitamin (ONE-DAILY MULTI VITAMINS PO) Take 30 mLs by mouth daily. Ronal Dines liquid vitamin     OVER THE COUNTER MEDICATION Place 1 application  into both eyes at bedtime. Pure & Clean     psyllium (METAMUCIL) 58.6 % powder Take 1 packet by mouth daily.     rosuvastatin  (CRESTOR ) 10 MG tablet Take 1 tablet (10 mg total) by mouth daily. 90 tablet 3   triamcinolone cream (KENALOG) 0.1 % Apply 1 Application topically 2 (two) times daily as needed (itching).     Vitamins C E (CRANBERRY URINARY COMFORT PO) Take by mouth.     No current facility-administered medications for this encounter.    ROS- All systems are reviewed and negative except as per the HPI above.  Physical Exam: Vitals:   06/28/24 0827  BP: 110/82  Pulse: 83  Weight: 64 kg  Height: 5' 7 (1.702 m)    GEN- The patient is well appearing, alert and oriented x 3 today.   Neck - no JVD or carotid bruit noted Lungs- Clear to ausculation bilaterally, normal work of breathing Heart- Irregular rate and rhythm, no murmurs, rubs or gallops, PMI not laterally displaced Extremities- no clubbing, cyanosis, or edema Skin - no rash or ecchymosis noted   Wt Readings from Last 3 Encounters:  06/28/24 64 kg  05/26/24 62.6 kg  05/16/24 62.1 kg    ECG today demonstrates  EKG Interpretation Date/Time:  Tuesday June 28 2024 08:29:37 EST Ventricular Rate:  83 PR Interval:    QRS Duration:  80 QT Interval:  368 QTC Calculation: 432 R Axis:   -36  Text Interpretation: Atrial  fibrillation with a competing junctional pacemaker Left axis deviation Low voltage QRS Cannot rule out Anteroseptal infarct , age undetermined Abnormal ECG When compared with ECG of 23-Feb-2024 16:45, PREVIOUS ECG IS PRESENT Confirmed by Terra Pac ([id number]) on 06/28/2024 9:39:40 AM    Echo 02/23/2024  1. Left ventricular ejection fraction, by estimation, is 60 to 65%. The  left ventricle has normal function. The left ventricle has no regional  wall motion abnormalities. Left ventricular diastolic parameters are  consistent with Grade I diastolic  dysfunction (impaired relaxation).   2. Right ventricular systolic function is normal. The right ventricular  size is normal. There is normal pulmonary artery systolic pressure. The  estimated right ventricular systolic pressure is 19.1 mmHg.   3. Left atrial size was mildly dilated.   4. The sonographer identified a possible right  atrial mass, however, this  is only noted in the subcostal view and it is also noted the mass is not  mobile and that the tricuspid valve excursion can be seen moving over the  mass, suggesting that it is  more likely to be echo artifact. Also, the same linear artifact is seen  extending into the left ventricle. This is consistent with comet tail  artifact.. Right atrial size was moderately dilated.   5. The mitral valve is grossly normal. Trivial mitral valve  regurgitation.   6. The aortic valve is tricuspid. Aortic valve regurgitation is trivial.   7. The pulmonic valve was abnormal.   8. The inferior vena cava is dilated in size with <50% respiratory  variability, suggesting right atrial pressure of 15 mmHg.   Epic records are reviewed at length today  CHA2DS2-VASc Score = 4  The patient's score is based upon: CHF History: 0 HTN History: 0 Diabetes History: 0 Stroke History: 0 Vascular Disease History: 1 Age Score: 2 Gender Score: 1       ASSESSMENT AND PLAN: Persistent Atrial Fibrillation (ICD10:   I48.19) The patient's CHA2DS2-VASc score is 4, indicating a 4.8% annual risk of stroke.   S/p afib ablation 06/10/22. S/p successful DCCV on 05/25/23 with long conversion pause noted.  Patient is currently in A-fib.  We discussed the procedure cardioversion to try to convert to NSR. We discussed the risks vs benefits of this procedure and how ultimately we cannot predict whether a patient will have early return of arrhythmia post procedure. After discussion, the patient wishes to proceed with cardioversion. Labs drawn today.  We will schedule patient for cardioversion after 3 weeks of uninterrupted anticoagulation.  Informed Consent   Shared Decision Making/Informed Consent The risks (stroke, cardiac arrhythmias rarely resulting in the need for a temporary or permanent pacemaker, skin irritation or burns and complications associated with conscious sedation including aspiration, arrhythmia, respiratory failure and death), benefits (restoration of normal sinus rhythm) and alternatives of a direct current cardioversion were explained in detail to Ms. Parkison and she agrees to proceed.       Secondary Hypercoagulable State (ICD10:  D68.69) The patient is at significant risk for stroke/thromboembolism based upon her CHA2DS2-VASc Score of 4.  Continue Apixaban  (Eliquis ).  We will count today as day 1 of patient taking Eliquis  compliantly.  Recommended to set alarm to avoid missing future doses.  CAD Coronary calcium  score 140 (55th percentile) 192 previously (63rd percentile) FFR negative for significant stenosis No anginal symptoms.   Follow up 2 weeks after DCCV.   Dorn Heinrich, PA-C Afib Clinic [facility] [address] [place], [place] [address] [phone] 06/28/2024 9:40 AM

## 2024-07-02 ENCOUNTER — Ambulatory Visit: Payer: Self-pay | Admitting: Student

## 2024-07-04 ENCOUNTER — Other Ambulatory Visit (HOSPITAL_COMMUNITY): Payer: Self-pay | Admitting: *Deleted

## 2024-07-04 ENCOUNTER — Encounter (HOSPITAL_COMMUNITY): Payer: Self-pay | Admitting: *Deleted

## 2024-07-05 ENCOUNTER — Encounter: Payer: Self-pay | Admitting: Adult Health

## 2024-07-05 ENCOUNTER — Ambulatory Visit: Admitting: Adult Health

## 2024-07-05 ENCOUNTER — Other Ambulatory Visit (HOSPITAL_COMMUNITY): Payer: Self-pay

## 2024-07-05 VITALS — BP 118/68 | HR 71 | Temp 97.4°F | Ht 67.0 in | Wt 137.4 lb

## 2024-07-05 DIAGNOSIS — D329 Benign neoplasm of meninges, unspecified: Secondary | ICD-10-CM | POA: Diagnosis not present

## 2024-07-05 DIAGNOSIS — R413 Other amnesia: Secondary | ICD-10-CM

## 2024-07-05 DIAGNOSIS — E785 Hyperlipidemia, unspecified: Secondary | ICD-10-CM

## 2024-07-05 DIAGNOSIS — I4819 Other persistent atrial fibrillation: Secondary | ICD-10-CM | POA: Diagnosis not present

## 2024-07-05 MED ORDER — APIXABAN 5 MG PO TABS
5.0000 mg | ORAL_TABLET | Freq: Two times a day (BID) | ORAL | 5 refills | Status: DC
  Filled 2024-07-05 – 2024-07-10 (×2): qty 60, 30d supply, fill #0

## 2024-07-05 NOTE — Progress Notes (Unsigned)
 [facility] clinic  Provider:  Jereld Serum DNP  Code Status:  Full Code  Goals of Care:     10/05/2023    3:40 PM  Advanced Directives  Does Patient Have a Medical Advance Directive? Yes  Type of Estate Agent of [place];Living will;Out of facility DNR (pink MOST or yellow form)  Does patient want to make changes to medical advance directive? No - Patient declined  Copy of Healthcare Power of Attorney in Chart? Yes - validated most recent copy scanned in chart (See row information)  Would patient like information on creating a medical advance directive? No - Patient declined     Chief Complaint  Patient presents with   Medical Management of Chronic Issues   Referral    Neurology. Patient states that her forgetfullness seems to be getting worse    Discussed the use of AI scribe software for clinical note transcription with the patient, who gave verbal consent to proceed.   HPI: Patient is a 83 y.o. female seen today for an acute visit for memory issues and wanting a neurology referral.  Discussed the use of AI scribe software for clinical note transcription with the patient, who gave verbal consent to proceed.  History of Present Illness   Shelia [name] is an 83 year old female with atrial fibrillation who presents for medication management and memory concerns. She is accompanied by her husband.  She has been experiencing increased forgetfulness, particularly with recall, and is frustrated by occasionally missing doses of her medication, Eliquis . Despite implementing systems such as setting alarms and using a pillbox, she still finds herself distracted and missing doses.  She is scheduled for a cardioversion on July 26, 2024, for her atrial fibrillation. She missed a dose of Eliquis  by six hours, which led to the rescheduling of the procedure. She currently takes Eliquis  5 mg twice daily and has not used her prescribed diltiazem  30 mg as needed  for heart rate control. She feels winded and has undergone a stress test, which could not be completed due to her atrial fibrillation, and subsequently had nuclear imaging, after which she was told by her cardiologist that everything was fine.  She has a history of meningioma, which was last imaged with no intervention needed. She experiences occasional dull headaches that resolve without medication, using Tylenol  as needed.  She is seeking a referral to a neurologist for her memory issues, having previously seen Dr. Seema Attar for memory concerns and meningioma but prefers not to return to her. She is considering [facility] for a second opinion.  Her current medications include Eliquis  5 mg twice daily and Crestor  5 mg once daily for hyperlipidemia. She reports her cholesterol levels are well-controlled. No swelling in her feet.       Past Medical History:  Diagnosis Date   Anxiety    Arthritis    left knee; left hip; lower back; mild to moderate in right hip (09/30/2016)   Atrial fibrillation (HCC)    Chronic lower back pain    Gastritis    Heart palpitations    History of bone density study 2018   History of mammogram 2021   History of MRI 2018   History of Papanicolaou smear of cervix    Hypercholesteremia    Osteopenia    Seasonal allergies     Past Surgical History:  Procedure Laterality Date   ATRIAL FIBRILLATION ABLATION N/A 06/10/2022   Procedure: ATRIAL FIBRILLATION ABLATION;  Surgeon: Inocencio, Will  Gladis, MD;  Location: [facility] INVASIVE CV LAB;  Service: Cardiovascular;  Laterality: N/A;   CARDIOVERSION N/A 11/17/2019   Procedure: CARDIOVERSION;  Surgeon: Pietro Redell RAMAN, MD;  Location: [facility] ENDOSCOPY;  Service: Cardiovascular;  Laterality: N/A;   CARDIOVERSION N/A 04/16/2021   Procedure: CARDIOVERSION;  Surgeon: Raford Riggs, MD;  Location: [facility] ENDOSCOPY;  Service: Cardiovascular;  Laterality: N/A;   CARDIOVERSION N/A 08/15/2021   Procedure: CARDIOVERSION;   Surgeon: Alvan Ronal BRAVO, MD;  Location: [facility] ENDOSCOPY;  Service: Cardiovascular;  Laterality: N/A;   CARDIOVERSION N/A 05/25/2023   Procedure: CARDIOVERSION;  Surgeon: Mona Vinie BROCKS, MD;  Location: [facility] INVASIVE CV LAB;  Service: Cardiovascular;  Laterality: N/A;   COLONOSCOPY     DILATION AND CURETTAGE OF UTERUS     JOINT REPLACEMENT     TONSILLECTOMY     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   Procedure: TOTAL HIP ARTHROPLASTY ANTERIOR APPROACH;  Surgeon: Yvone Rush, MD;  Location: [facility] OR;  Service: Orthopedics;  Laterality: Left;    No Known Allergies  Outpatient Encounter Medications as of 07/05/2024  Medication Sig   acetaminophen  (TYLENOL ) 500 MG tablet Take 2 tablets (1,000 mg total) by mouth every 8 (eight) hours as needed.   apixaban  (ELIQUIS ) 5 MG TABS tablet Take 1 tablet (5 mg total) by mouth 2 (two) times daily.   Brimonidine Tartrate (LUMIFY) 0.025 % SOLN Place 1 drop into both eyes daily. Red eyes   Carboxymeth-Glyc-Polysorb PF (REFRESH OPTIVE MEGA-3) 0.5-1-0.5 % SOLN Place 1 drop into both eyes 4 (four) times daily as needed (dry eyes). Enhance with flax seed oil   cetirizine  (ZYRTEC ) 10 MG tablet Take 1 tablet (10 mg total) by mouth daily as needed for allergies.   estradiol  (ESTRACE ) 0.1 MG/GM vaginal cream Apply to vaginal area 2 (two) times a week as directed.   hydrocortisone  cream 0.5 % Apply 1 Application topically 2 (two) times daily as needed for itching.   ketotifen (ZADITOR) 0.025 % ophthalmic solution Place 1 drop into both eyes 2 (two) times daily as needed (itchy eyes). In the fall   mineral oil-hydrophilic petrolatum (AQUAPHOR) ointment Apply 1 Application topically as needed for dry skin.   Multiple Vitamin (ONE-DAILY MULTI VITAMINS PO) Take 30 mLs by mouth daily. Ronal Dines liquid vitamin   OVER THE COUNTER MEDICATION Place 1 application  into both eyes at bedtime. Pure & Clean   psyllium (METAMUCIL) 58.6 % powder Take 1 packet by mouth daily.   rosuvastatin   (CRESTOR ) 10 MG tablet Take 1 tablet (10 mg total) by mouth daily.   Vitamins C E (CRANBERRY URINARY COMFORT PO) Take by mouth.   diltiazem  (CARDIZEM ) 30 MG tablet Take 1 tablet every 4 hours AS NEEDED for AFIB heart rate >100 as long as top BP >100. (Patient not taking: Reported on 07/05/2024)   triamcinolone cream (KENALOG) 0.1 % Apply 1 Application topically 2 (two) times daily as needed (itching). (Patient not taking: Reported on 07/05/2024)   No facility-administered encounter medications on file as of 07/05/2024.    Review of Systems:  Review of Systems  Constitutional:  Negative for appetite change, chills, fatigue and fever.  HENT:  Negative for congestion, hearing loss, rhinorrhea and sore throat.   Eyes: Negative.   Respiratory:  Negative for cough, shortness of breath and wheezing.   Cardiovascular:  Negative for chest pain, palpitations and leg swelling.  Gastrointestinal:  Negative for abdominal pain, constipation, diarrhea, nausea and vomiting.  Genitourinary:  Negative for dysuria.  Musculoskeletal:  Negative  for arthralgias, back pain and myalgias.  Skin:  Negative for color change, rash and wound.  Neurological:  Negative for dizziness, weakness and headaches.  Psychiatric/Behavioral:  Negative for behavioral problems. The patient is not nervous/anxious.     Health Maintenance  Topic Date Due   COVID-19 Vaccine (9 - Pfizer risk 2025-26 season) 10/09/2024   Medicare Annual Wellness (AWV)  05/16/2025   DTaP/Tdap/Td (4 - Td or Tdap) 12/26/2027   Pneumococcal Vaccine: 50+ Years  Completed   Influenza Vaccine  Completed   Bone Density Scan  Completed   Zoster Vaccines- Shingrix  Completed   Meningococcal B Vaccine  Aged Out   Hepatitis C Screening  Discontinued    Physical Exam: Vitals:   07/05/24 1433  BP: 118/68  Pulse: 71  Temp: (!) 97.4 F (36.3 C)  TempSrc: Temporal  SpO2: 98%  Weight: 137 lb 6.4 oz (62.3 kg)  Height: 5' 7 (1.702 m)   Body mass index is  21.52 kg/m. Physical Exam Constitutional:      Appearance: Normal appearance.  HENT:     Head: Normocephalic and atraumatic.     Nose: Nose normal.     Mouth/Throat:     Mouth: Mucous membranes are moist.  Eyes:     Conjunctiva/sclera: Conjunctivae normal.  Cardiovascular:     Rate and Rhythm: Normal rate. Rhythm irregular.  Pulmonary:     Effort: Pulmonary effort is normal.     Breath sounds: Normal breath sounds.  Abdominal:     General: Bowel sounds are normal.     Palpations: Abdomen is soft.  Musculoskeletal:        General: Normal range of motion.     Cervical back: Normal range of motion.  Skin:    General: Skin is warm and dry.  Neurological:     General: No focal deficit present.     Mental Status: She is alert and oriented to person, place, and time.  Psychiatric:        Mood and Affect: Mood normal.        Behavior: Behavior normal.        Thought Content: Thought content normal.        Judgment: Judgment normal.     Labs reviewed: Basic Metabolic Panel: Recent Labs    08/11/23 0856 08/20/23 1645 08/27/23 1524 02/04/24 1418 02/23/24 1705  NA 145   < > 143 142 139  K 4.2   < > 4.0 4.3 4.1  CL 108   < > 108 106 105  CO2 30   < > 25 23 26   GLUCOSE 74   < > 97 87 106*  BUN 26*   < > 19 25 21   CREATININE 0.77   < > 0.73 0.76 0.78  CALCIUM  9.2   < > 9.5 9.1 9.3  TSH 3.37  --   --   --   --    < > = values in this interval not displayed.   Liver Function Tests: Recent Labs    08/20/23 1645 08/27/23 1524 10/05/23 1612 02/04/24 1418  AST 33 40 29 48*  ALT 28 23 19  37*  ALKPHOS 60 52  --  76  BILITOT 1.0 1.2 0.6 0.6  PROT 6.3* 6.8 5.7* 6.0  ALBUMIN 4.5 4.4  --  4.4   No results for input(s): LIPASE, AMYLASE in the last 8760 hours. No results for input(s): AMMONIA in the last 8760 hours. CBC: Recent Labs    08/11/23 0856  08/20/23 1645 08/27/23 1524 02/04/24 1418 02/23/24 1705 02/24/24 0542  WBC 3.8 6.5 5.5 4.2 4.8 5.0  NEUTROABS  1,588 4.5 3.0  --   --   --   HGB 14.3 14.9 15.0 14.1 14.5 13.7  HCT 43.4 45.1 45.7 43.2 42.8 40.8  MCV 98.0 97.0 100.7* 101* 98.8 99.0  PLT 150 152 146* 132* 119* 104*   Lipid Panel: Recent Labs    08/11/23 0856 02/04/24 1418  CHOL 153 147  HDL 57 62  LDLCALC 82 73  TRIG 67 55  CHOLHDL 2.7 2.4   No results found for: HGBA1C  Procedures since last visit: MYOCARDIAL PERFUSION/CT RAD READ Result Date: 06/29/2024 CLINICAL DATA:  This over-read does not include interpretation of cardiac or coronary anatomy or pathology. The cardiac SPECT CT interpretation by the cardiologist is attached. COMPARISON:  CT February 23, 2024 FINDINGS: Vascular: Aortic atherosclerosis.  Coronary artery calcifications. Mediastinum/Nodes: No pathologically enlarged mediastinal lymph nodes. Lungs/Pleura: Stable scattered pulmonary nodules in the right lung base for instance on image 22/1. Upper Abdomen: No acute abnormality. Musculoskeletal: Thoracic spondylosis. IMPRESSION: 1. Stable scattered pulmonary nodules in the right lung base. Consider follow-up chest CT in 1 year. 2. Aortic atherosclerosis. Aortic Atherosclerosis (ICD10-I70.0). Electronically Signed   By: Reyes Holder M.D.   On: 06/29/2024 12:57   Myocardial Perfusion Imaging Result Date: 06/28/2024   The study is normal. The study is low risk.   No ST deviation was noted. The ECG was not diagnostic due to pharmacologic protocol.   LV perfusion is normal. There is no evidence of ischemia. There is no evidence of infarction.   Left ventricular function is normal. Nuclear stress EF: 89%. The left ventricular ejection fraction is hyperdynamic (>65%). End diastolic cavity size is normal. End systolic cavity size is normal.   CT images were obtained for attenuation correction and were examined for the presence of coronary calcium  when appropriate.   Coronary calcium  was present on the attenuation correction CT images. Moderate coronary calcifications were present.  Coronary calcifications were present in the left anterior descending artery distribution(s).    Assessment/Plan    Assessment and Plan    Memory loss Increased forgetfulness affecting medication adherence. Referred for neurology evaluation. - Referred to [facility] for second opinion. - Implement medication adherence strategies: pill organizer, bedside placement.  Persistent atrial fibrillation Scheduled for cardioversion. Missed Eliquis  dose delayed procedure. Normal stress test despite feeling winded. - Continue Eliquis  5 mg twice daily. - Continue diltiazem  30 mg as needed for heart rate >100 bpm. - Ensure Eliquis  adherence to prevent cardioversion delays. - Sent Eliquis  prescription with five refills to pharmacy.  Meningioma No intervention needed. Headaches managed with Tylenol . - Continue Tylenol  as needed for headaches.  Hyperlipidemia Cholesterol well-controlled with Crestor . - Continue Crestor  5 mg daily.        Labs/tests ordered:  None  Return if symptoms worsen or fail to improve.  Shelia Vogl Medina-Vargas, NP

## 2024-07-07 DIAGNOSIS — I4819 Other persistent atrial fibrillation: Secondary | ICD-10-CM | POA: Diagnosis not present

## 2024-07-08 ENCOUNTER — Ambulatory Visit (HOSPITAL_COMMUNITY): Payer: Self-pay | Admitting: Internal Medicine

## 2024-07-08 LAB — CBC
Hematocrit: 44.7 % (ref 34.0–46.6)
Hemoglobin: 14.8 g/dL (ref 11.1–15.9)
MCH: 33 pg (ref 26.6–33.0)
MCHC: 33.1 g/dL (ref 31.5–35.7)
MCV: 100 fL — ABNORMAL HIGH (ref 79–97)
Platelets: 154 x10E3/uL (ref 150–450)
RBC: 4.49 x10E6/uL (ref 3.77–5.28)
RDW: 11.5 % — ABNORMAL LOW (ref 11.7–15.4)
WBC: 5.5 x10E3/uL (ref 3.4–10.8)

## 2024-07-08 LAB — BASIC METABOLIC PANEL WITH GFR
BUN/Creatinine Ratio: 22 (ref 12–28)
BUN: 19 mg/dL (ref 8–27)
CO2: 25 mmol/L (ref 20–29)
Calcium: 9.3 mg/dL (ref 8.7–10.3)
Chloride: 102 mmol/L (ref 96–106)
Creatinine, Ser: 0.87 mg/dL (ref 0.57–1.00)
Glucose: 104 mg/dL — ABNORMAL HIGH (ref 70–99)
Potassium: 4 mmol/L (ref 3.5–5.2)
Sodium: 141 mmol/L (ref 134–144)
eGFR: 66 mL/min/1.73 (ref >59)

## 2024-07-10 ENCOUNTER — Other Ambulatory Visit (HOSPITAL_COMMUNITY): Payer: Self-pay

## 2024-07-11 ENCOUNTER — Other Ambulatory Visit (HOSPITAL_COMMUNITY): Payer: Self-pay

## 2024-07-12 ENCOUNTER — Telehealth: Payer: Self-pay

## 2024-07-12 ENCOUNTER — Encounter: Payer: Self-pay | Admitting: Adult Health

## 2024-07-12 NOTE — Telephone Encounter (Signed)
-    CT chest showed micronodule in the right lower lobe -  per Fleischner guidelines, no routine follow up and low risk

## 2024-07-12 NOTE — Telephone Encounter (Signed)
Sent the patient MyChart message.

## 2024-07-12 NOTE — Telephone Encounter (Signed)
 Copied from CRM #[id number]. Topic: Clinical - Medical Advice >> Jul 12, 2024 11:08 AM Farrel B wrote: Reason for CRM: pt is needing to see if her pcp has seen the latest messages from cardiologist regarding resulting and also needing to know if she needs to have another followup CT. Also to have the provider to look back and prior and current cts due to fact it was told to her that she possibly has COPD

## 2024-07-17 NOTE — Telephone Encounter (Signed)
 CT angio chest done on 02/23/24 was compared to CT heart morphology done on 04/18/21. Per Fleischner guidelines nodules <102mm does not need routine follow up. I can refer you to pulmonology if you want.

## 2024-07-19 ENCOUNTER — Encounter: Payer: Self-pay | Admitting: Neurology

## 2024-07-25 ENCOUNTER — Other Ambulatory Visit (HOSPITAL_COMMUNITY): Payer: Self-pay

## 2024-07-25 ENCOUNTER — Other Ambulatory Visit (HOSPITAL_COMMUNITY): Payer: Self-pay | Admitting: *Deleted

## 2024-07-25 MED ORDER — RIVAROXABAN 15 MG PO TABS
15.0000 mg | ORAL_TABLET | Freq: Every day | ORAL | 6 refills | Status: AC
  Filled 2024-07-25: qty 30, 30d supply, fill #0
  Filled 2024-08-13 – 2024-08-17 (×3): qty 30, 30d supply, fill #1

## 2024-07-26 ENCOUNTER — Ambulatory Visit (HOSPITAL_COMMUNITY): Admission: RE | Admit: 2024-07-26 | Source: Home / Self Care | Admitting: Cardiology

## 2024-07-26 ENCOUNTER — Encounter (HOSPITAL_COMMUNITY): Admission: RE | Payer: Self-pay

## 2024-07-26 SURGERY — CARDIOVERSION (CATH LAB)
Anesthesia: Monitor Anesthesia Care

## 2024-08-01 ENCOUNTER — Other Ambulatory Visit: Payer: Self-pay

## 2024-08-01 ENCOUNTER — Encounter: Payer: Self-pay | Admitting: Adult Health

## 2024-08-01 VITALS — BP 122/64 | HR 80 | Temp 98.0°F | Ht 67.0 in | Wt 142.0 lb

## 2024-08-01 DIAGNOSIS — R051 Acute cough: Secondary | ICD-10-CM

## 2024-08-01 DIAGNOSIS — I4819 Other persistent atrial fibrillation: Secondary | ICD-10-CM

## 2024-08-01 DIAGNOSIS — R0981 Nasal congestion: Secondary | ICD-10-CM

## 2024-08-01 LAB — POC COVID19 BINAXNOW: SARS Coronavirus 2 Ag: NEGATIVE

## 2024-08-01 MED ORDER — GUAIFENESIN 100 MG/5ML PO LIQD: 10.0000 mL | Freq: Four times a day (QID) | ORAL | Status: DC | PRN

## 2024-08-01 NOTE — Progress Notes (Signed)
 "  [facility]  Provider:  Jereld Serum DNP  Code Status:  Full Code  Goals of Care:     10/05/2023    3:40 PM  Advanced Directives  Does Patient Have a Medical Advance Directive? Yes  Type of Estate Agent of [place];Living will;Out of facility DNR (pink MOST or yellow form)  Does patient want to make changes to medical advance directive? No - Patient declined  Copy of Healthcare Power of Attorney in Chart? Yes - validated most recent copy scanned in chart (See row information)  Would patient like information on creating a medical advance directive? No - Patient declined     Chief Complaint  Patient presents with   Cough    Dry cough, today it seem better. Symptoms were ongoing for a week with a cold and runny nose   Discussed the use of AI scribe software for clinical note transcription with the patient, who gave verbal consent to proceed.   HPI: Patient is a 84 y.o. female seen today for an acute visit for dry cough and chest cold.  Discussed the use of AI scribe software for clinical note transcription with the patient, who gave verbal consent to proceed.  History of Present Illness   Shelia [name] is an 84 year old female with atrial fibrillation who presents with symptoms of a chest cold.  She began experiencing symptoms of a chest cold late yesterday afternoon, including rhinorrhea, a gravelly sensation in her chest, and wheezing. She describes the sensation as 'little crunches of ice' and notes that it was worse when she went to bed and upon waking. She experienced coughing last night and this morning, but it has since resolved. A sore throat lasted for a day but has since resolved. No nasal congestion or fever, with a current temperature of 45F.  She experiences shortness of breath, which she attributes to her atrial fibrillation. She is scheduled for a cardioversion on January 23. She is currently taking Xarelto  15 mg daily for atrial  fibrillation and has diltiazem  30 mg as needed for heart rate control, though she reports not using it.  She mentions feeling achy, particularly in her neck, and has been taking melatonin for the past week to aid sleep, though she does not recall the dosage. She typically goes to bed around 10:30 PM and wakes up at 7:30 AM.  Her husband, who also has atrial fibrillation, consumes three glasses of wine daily, which she is concerned about. She mentions that he uses olive oil for cooking.        Past Medical History:  Diagnosis Date   Anxiety    Arthritis    left knee; left hip; lower back; mild to moderate in right hip (09/30/2016)   Atrial fibrillation (HCC)    Chronic lower back pain    Gastritis    Heart palpitations    History of bone density study 2018   History of mammogram 2021   History of MRI 2018   History of Papanicolaou smear of cervix    Hypercholesteremia    Osteopenia    Seasonal allergies     Past Surgical History:  Procedure Laterality Date   ATRIAL FIBRILLATION ABLATION N/A 06/10/2022   Procedure: ATRIAL FIBRILLATION ABLATION;  Surgeon: Inocencio Soyla Lunger, MD;  Location: [facility] INVAS[facility];  Service: Cardiovascular;  Laterality: N/A;   CARDIOVERSION N/A 11/17/2019   Procedure: CARDIOVERSION;  Surgeon: Pietro Redell RAMAN, MD;  Location: [facility] ENDOSCOPY;  Service: Cardiovascular;  Laterality: N/A;   CARDIOVERSION N/A 04/16/2021   Procedure: CARDIOVERSION;  Surgeon: Raford Riggs, MD;  Location: [facility] ENDOSCOPY;  Service: Cardiovascular;  Laterality: N/A;   CARDIOVERSION N/A 08/15/2021   Procedure: CARDIOVERSION;  Surgeon: Alvan Ronal BRAVO, MD;  Location: [facility] ENDOSCOPY;  Service: Cardiovascular;  Laterality: N/A;   CARDIOVERSION N/A 05/25/2023   Procedure: CARDIOVERSION;  Surgeon: Mona Vinie BROCKS, MD;  Location: [facility] INVASIVE CV LAB;  Service: Cardiovascular;  Laterality: N/A;   COLONOSCOPY     DILATION AND CURETTAGE OF UTERUS     JOINT REPLACEMENT     TONSILLECTOMY      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   Procedure: TOTAL HIP ARTHROPLASTY ANTERIOR APPROACH;  Surgeon: Yvone Rush, MD;  Location: [facility] OR;  Service: Orthopedics;  Laterality: Left;    Allergies[1]  Outpatient Encounter Medications as of 08/01/2024  Medication Sig   acetaminophen  (TYLENOL ) 325 MG tablet Take 325 mg by mouth every 6 (six) hours as needed (pain.).   Brimonidine Tartrate (LUMIFY) 0.025 % SOLN Place 1 drop into both eyes daily. Red eyes   Carboxymeth-Glyc-Polysorb PF (REFRESH OPTIVE MEGA-3) 0.5-1-0.5 % SOLN Place 1 drop into both eyes 4 (four) times daily as needed (dry eyes). Enhance with flax seed oil   cetirizine  (ZYRTEC ) 10 MG tablet Take 1 tablet (10 mg total) by mouth daily as needed for allergies.   estradiol  (ESTRACE ) 0.1 MG/GM vaginal cream Apply to vaginal area 2 (two) times a week as directed.   EYELID CLEANSERS EX Apply 1 spray topically daily. PURE & CLEAN Daily Lid Cleanse Spray   guaiFENesin  (ROBITUSSIN) 100 MG/5ML liquid Take 10 mLs by mouth every 6 (six) hours as needed for up to 10 days for cough or to loosen phlegm.   hydrocortisone  cream 0.5 % Apply 1 Application topically 2 (two) times daily as needed for itching.   ketotifen (ZADITOR) 0.025 % ophthalmic solution Place 1 drop into both eyes 2 (two) times daily as needed (itchy eyes). In the fall   mineral oil-hydrophilic petrolatum (AQUAPHOR) ointment Apply 1 Application topically as needed for dry skin ((hands)).   Multiple Vitamin (MULTIVITAMIN) LIQD Take 60 mLs by mouth in the morning.   psyllium (METAMUCIL) 58.6 % powder Take 1 packet by mouth in the morning.   Rivaroxaban  (XARELTO ) 15 MG TABS tablet Take 1 tablet (15 mg total) by mouth daily with supper.   rosuvastatin  (CRESTOR ) 10 MG tablet Take 1 tablet (10 mg total) by mouth daily. (Patient taking differently: Take 10 mg by mouth at bedtime.)   triamcinolone cream (KENALOG) 0.1 % Apply 1 Application topically 2 (two) times daily as needed (itching).    Vitamins C E (CRANBERRY URINARY COMFORT PO) Take 1 capsule by mouth in the morning.   diltiazem  (CARDIZEM ) 30 MG tablet Take 1 tablet every 4 hours AS NEEDED for AFIB heart rate >100 as long as top BP >100. (Patient not taking: Reported on 08/01/2024)   No facility-administered encounter medications on file as of 08/01/2024.    Review of Systems:  Review of Systems  Constitutional:  Negative for appetite change, chills, fatigue and fever.  HENT:  Negative for congestion, hearing loss, rhinorrhea and sore throat.   Eyes: Negative.   Respiratory:  Positive for cough, shortness of breath and wheezing.   Cardiovascular:  Negative for chest pain, palpitations and leg swelling.  Gastrointestinal:  Negative for abdominal pain, constipation, diarrhea, nausea and vomiting.  Genitourinary:  Negative for dysuria.  Musculoskeletal:  Positive for arthralgias. Negative for back pain  and myalgias.  Skin:  Negative for color change, rash and wound.  Neurological:  Negative for dizziness, weakness and headaches.  Psychiatric/Behavioral:  Negative for behavioral problems. The patient is not nervous/anxious.     Health Maintenance  Topic Date Due   COVID-19 Vaccine (9 - Pfizer risk 2025-26 season) 10/09/2024   Medicare Annual Wellness (AWV)  05/16/2025   DTaP/Tdap/Td (5 - Td or Tdap) 12/26/2027   Pneumococcal Vaccine: 50+ Years  Completed   Influenza Vaccine  Completed   Bone Density Scan  Completed   Zoster Vaccines- Shingrix  Completed   Meningococcal B Vaccine  Aged Out   Hepatitis C Screening  Discontinued    Physical Exam: Vitals:   08/01/24 1402  BP: 122/64  Pulse: 80  Temp: 98 F (36.7 C)  TempSrc: Temporal  SpO2: 100%  Weight: 142 lb (64.4 kg)  Height: 5' 7 (1.702 m)   Body mass index is 22.24 kg/m. Physical Exam Constitutional:      Appearance: Normal appearance.  HENT:     Head: Normocephalic and atraumatic.     Nose: Nose normal.     Mouth/Throat:     Mouth: Mucous  membranes are moist.  Eyes:     Conjunctiva/sclera: Conjunctivae normal.  Cardiovascular:     Rate and Rhythm: Normal rate. Rhythm irregular.  Pulmonary:     Effort: Pulmonary effort is normal.     Breath sounds: Normal breath sounds.  Abdominal:     General: Bowel sounds are normal.     Palpations: Abdomen is soft.  Musculoskeletal:        General: Normal range of motion.     Cervical back: Normal range of motion.  Skin:    General: Skin is warm and dry.  Neurological:     General: No focal deficit present.     Mental Status: She is alert and oriented to person, place, and time.  Psychiatric:        Mood and Affect: Mood normal.        Behavior: Behavior normal.        Thought Content: Thought content normal.        Judgment: Judgment normal.     Labs reviewed: Basic Metabolic Panel: Recent Labs    08/11/23 0856 08/20/23 1645 02/04/24 1418 02/23/24 1705 07/07/24 1552  NA 145   < > 142 139 141  K 4.2   < > 4.3 4.1 4.0  CL 108   < > 106 105 102  CO2 30   < > 23 26 25   GLUCOSE 74   < > 87 106* 104*  BUN 26*   < > 25 21 19   CREATININE 0.77   < > 0.76 0.78 0.87  CALCIUM  9.2   < > 9.1 9.3 9.3  TSH 3.37  --   --   --   --    < > = values in this interval not displayed.   Liver Function Tests: Recent Labs    08/20/23 1645 08/27/23 1524 10/05/23 1612 02/04/24 1418  AST 33 40 29 48*  ALT 28 23 19  37*  ALKPHOS 60 52  --  76  BILITOT 1.0 1.2 0.6 0.6  PROT 6.3* 6.8 5.7* 6.0  ALBUMIN 4.5 4.4  --  4.4   No results for input(s): LIPASE, AMYLASE in the last 8760 hours. No results for input(s): AMMONIA in the last 8760 hours. CBC: Recent Labs    08/11/23 0856 08/20/23 1645 08/27/23 1524 02/04/24 1418 02/23/24  1705 02/24/24 0542 07/07/24 1552  WBC 3.8 6.5 5.5   < > 4.8 5.0 5.5  NEUTROABS 1,588 4.5 3.0  --   --   --   --   HGB 14.3 14.9 15.0   < > 14.5 13.7 14.8  HCT 43.4 45.1 45.7   < > 42.8 40.8 44.7  MCV 98.0 97.0 100.7*   < > 98.8 99.0 100*  PLT  150 152 146*   < > 119* 104* 154   < > = values in this interval not displayed.   Lipid Panel: Recent Labs    08/11/23 0856 02/04/24 1418  CHOL 153 147  HDL 57 62  LDLCALC 82 73  TRIG 67 55  CHOLHDL 2.7 2.4   No results found for: HGBA1C  Procedures since last visit: No results found.  Assessment/Plan  1. Acute cough (Primary) -  Likely viral etiology. COVID test negative. Symptoms expected to resolve with supportive care. - Take guaifenesin  10 mL every 6 hours as needed for cough and mucus, especially before bed, for up to 10 days. - Advised to monitor for fever or worsening symptoms and report if she occurs. -  guaifenesin  (ROBITUSSIN) 100 MG/5ML liquid; Take 10 mLs by mouth every 6 (six) hours as needed for up to 10 days for cough or to loosen phlegm.  2. Persistent atrial fibrillation (HCC) -  Managed with Xarelto  and diltiazem  PRN. Irregular heart rate noted, no acute symptoms. Scheduled for cardioversion on January 23rd. - Continue Xarelto  15 mg daily. - Use diltiazem  as needed for heart rate control. - Proceed with scheduled cardioversion on January 23rd.    Labs/tests ordered:  POC COVID-19 test   Return if symptoms worsen or fail to improve.  Elizabeht Suto Medina-Vargas, NP      [1] No Known Allergies  "

## 2024-08-05 ENCOUNTER — Ambulatory Visit (HOSPITAL_COMMUNITY): Admitting: Internal Medicine

## 2024-08-12 ENCOUNTER — Encounter: Payer: Self-pay | Admitting: Nurse Practitioner

## 2024-08-12 ENCOUNTER — Encounter: Payer: Self-pay | Admitting: Adult Health

## 2024-08-12 NOTE — Telephone Encounter (Signed)
 For aphasia, you need to follow up with neurology and  might need speech therapy. Would you want me to refer you to speech therapy?

## 2024-08-13 ENCOUNTER — Other Ambulatory Visit (HOSPITAL_COMMUNITY): Payer: Self-pay

## 2024-08-16 ENCOUNTER — Other Ambulatory Visit: Payer: Self-pay | Admitting: Adult Health

## 2024-08-16 ENCOUNTER — Ambulatory Visit (HOSPITAL_COMMUNITY)
Admission: RE | Admit: 2024-08-16 | Discharge: 2024-08-16 | Disposition: A | Discharge status: Home / Self Care | Source: Ambulatory Visit | Attending: Internal Medicine | Admitting: Internal Medicine

## 2024-08-16 ENCOUNTER — Encounter (HOSPITAL_COMMUNITY): Payer: Self-pay | Admitting: Physician Assistant

## 2024-08-16 ENCOUNTER — Ambulatory Visit (HOSPITAL_COMMUNITY): Payer: Self-pay | Admitting: Physician Assistant

## 2024-08-16 VITALS — BP 94/72 | HR 80 | Ht 67.0 in | Wt 142.6 lb

## 2024-08-16 DIAGNOSIS — I4819 Other persistent atrial fibrillation: Secondary | ICD-10-CM | POA: Diagnosis not present

## 2024-08-16 DIAGNOSIS — R1310 Dysphagia, unspecified: Secondary | ICD-10-CM

## 2024-08-16 DIAGNOSIS — D6869 Other thrombophilia: Secondary | ICD-10-CM

## 2024-08-16 LAB — BASIC METABOLIC PANEL WITH GFR
BUN/Creatinine Ratio: 23 (ref 12–28)
BUN: 19 mg/dL (ref 8–27)
CO2: 30 mmol/L — ABNORMAL HIGH (ref 20–29)
Calcium: 9.5 mg/dL (ref 8.7–10.3)
Chloride: 107 mmol/L — ABNORMAL HIGH (ref 96–106)
Creatinine, Ser: 0.82 mg/dL (ref 0.57–1.00)
Glucose: 57 mg/dL — ABNORMAL LOW (ref 70–99)
Potassium: 4.5 mmol/L (ref 3.5–5.2)
Sodium: 145 mmol/L — ABNORMAL HIGH (ref 134–144)
eGFR: 71 mL/min/1.73

## 2024-08-16 LAB — CBC
Hematocrit: 44.9 % (ref 34.0–46.6)
Hemoglobin: 15.2 g/dL (ref 11.1–15.9)
MCH: 32.8 pg (ref 26.6–33.0)
MCHC: 33.9 g/dL (ref 31.5–35.7)
MCV: 97 fL (ref 79–97)
Platelets: 153 x10E3/uL (ref 150–450)
RBC: 4.63 x10E6/uL (ref 3.77–5.28)
RDW: 12.4 % (ref 11.7–15.4)
WBC: 4.6 x10E3/uL (ref 3.4–10.8)

## 2024-08-16 NOTE — H&P (View-Only) (Signed)
 "   Primary Care Physician: Phyllis Jereld BROCKS, NP Primary Cardiologist: Dr Pietro Primary Electrophysiologist: Dr Inocencio Referring Physician: Lesley triage    Shelia [name] is a 84 y.o. female with a history of HLD, CAD, atrial fibrillation who presents for follow up in the [facility].  The patient was initially diagnosed with atrial fibrillation early 2021 and underwent DCCV on 11/17/19. She had recurrent symptoms of persistent afib and had repeat DCCV on 04/16/21. Patient is on Eliquis  for a CHADS2VASC score of 4. She presented to urgent care 07/24/21 with palpitations and mild SOB. ECG showed rate controlled afib. There were no specific triggers that she could identify. Patient is s/p DCCV on 08/15/21.   Patient is s/p afib ablation with Dr Inocencio on 06/10/22.   On follow up today, patient reports that about one week ago she noticed more fatigue and SOB with exertion. She has also had some intermittent dizziness. She checked her Crist mobile two days ago which showed afib. There were no specific triggers that she could identify. She did miss a dose of Eliquis  but is sure she has not missed any since 8/27.  On follow up 05/20/23, she is currently in Afib. She feels SOB with exertion when in Afib. She missed a dose of Eliquis  on 9/17 and DCCV was rescheduled to 10/28. She has not missed any more doses of Eliquis  since then.    On follow up 05/29/23, she is currently in NSR. S/p successful DCCV on 05/25/23. Unfortunately, patient contacted clinic on 10/30 noting she went back into Afib and was prescribed PRN diltiazem . She did convert to normal rhythm after taking one pill of PRN diltiazem .   On follow-up 06/28/2024, patient is currently in A-fib.  She contacted the clinic on 11/25 noting via Kardia mobile device she was back in A-fib.  She has missed a few doses of Eliquis  and cannot specifically recall when she was last compliant other than  today.  Follow up 08/16/24. Patient returns for follow up for atrial fibrillation. She remains in persistent afib and is scheduled for DCCV on 08/19/24. She has symptoms of SOB on exertion when in afib. She denies any missed doses of anticoagulation in the past 3 weeks. No bleeding issues on anticoagulation.   Today, she  denies symptoms of palpitations, chest pain, orthopnea, PND, lower extremity edema, dizziness, presyncope, syncope, snoring, daytime somnolence, bleeding, or neurologic sequela. The patient is tolerating medications without difficulties and is otherwise without complaint today.    Atrial Fibrillation Risk Factors:  she does not have symptoms or diagnosis of sleep apnea. she does not have a history of rheumatic fever. she does not have a history of alcohol use.   Atrial Fibrillation Management history:  Previous antiarrhythmic drugs: none Previous cardioversions: 11/17/19, 04/16/21, 05/25/23 Previous ablations: 06/10/22 Anticoagulation history: Eliquis    Past Medical History:  Diagnosis Date   Anxiety    Arthritis    left knee; left hip; lower back; mild to moderate in right hip (09/30/2016)   Atrial fibrillation (HCC)    Chronic lower back pain    Gastritis    Heart palpitations    History of bone density study 2018   History of mammogram 2021   History of MRI 2018   History of Papanicolaou smear of cervix    Hypercholesteremia    Osteopenia    Seasonal allergies     Current Outpatient Medications  Medication Sig Dispense Refill   acetaminophen  (TYLENOL ) 325 MG tablet  Take 325 mg by mouth every 6 (six) hours as needed (pain.).     Brimonidine Tartrate (LUMIFY) 0.025 % SOLN Place 1 drop into both eyes daily. Red eyes     Carboxymeth-Glyc-Polysorb PF (REFRESH OPTIVE MEGA-3) 0.5-1-0.5 % SOLN Place 1 drop into both eyes 4 (four) times daily as needed (dry eyes). Enhance with flax seed oil     cetirizine  (ZYRTEC ) 10 MG tablet Take 1 tablet (10 mg total) by mouth  daily as needed for allergies.     diltiazem  (CARDIZEM ) 30 MG tablet Take 1 tablet every 4 hours AS NEEDED for AFIB heart rate >100 as long as top BP >100. 30 tablet 1   estradiol  (ESTRACE ) 0.1 MG/GM vaginal cream Apply to vaginal area 2 (two) times a week as directed. 42.5 g 1   EYELID CLEANSERS EX Apply 1 spray topically daily. PURE & CLEAN Daily Lid Cleanse Spray     hydrocortisone  cream 0.5 % Apply 1 Application topically 2 (two) times daily as needed for itching.     ketotifen (ZADITOR) 0.025 % ophthalmic solution Place 1 drop into both eyes 2 (two) times daily as needed (itchy eyes). In the fall     METAMUCIL FIBER PO Take 2 Scoops by mouth daily.     Multiple Vitamin (MULTIVITAMIN) LIQD Take 60 mLs by mouth in the morning.     Rivaroxaban  (XARELTO ) 15 MG TABS tablet Take 1 tablet (15 mg total) by mouth daily with supper. 30 tablet 6   rosuvastatin  (CRESTOR ) 10 MG tablet Take 1 tablet (10 mg total) by mouth daily. 90 tablet 3   triamcinolone cream (KENALOG) 0.1 % Apply 1 Application topically 2 (two) times daily as needed (itching).     Vitamins C E (CRANBERRY URINARY COMFORT PO) Take 1 capsule by mouth in the morning.     No current facility-administered medications for this encounter.    ROS- All systems are reviewed and negative except as per the HPI above.  Physical Exam: Vitals:   08/16/24 0856  BP: 94/72  Pulse: 80  Weight: 64.7 kg  Height: 5' 7 (1.702 m)     GEN: Well nourished, well developed in no acute distress CARDIAC: Irregularly irregular rate and rhythm, no murmurs, rubs, gallops RESPIRATORY:  Clear to auscultation without rales, wheezing or rhonchi  ABDOMEN: Soft, non-tender, non-distended EXTREMITIES:  No edema; No deformity    Wt Readings from Last 3 Encounters:  08/16/24 64.7 kg  08/01/24 64.4 kg  07/05/24 62.3 kg    ECG today demonstrates  EKG Interpretation Date/Time:  Tuesday August 16 2024 08:59:20 EST Ventricular Rate:  80 PR Interval:     QRS Duration:  78 QT Interval:  360 QTC Calculation: 415 R Axis:   -39  Text Interpretation: Atrial fibrillation Left axis deviation Low voltage QRS Cannot rule out Anteroseptal infarct (cited on or before 28-Jun-2024) Abnormal ECG When compared with ECG of 28-Jun-2024 08:29, No significant change was found Confirmed by [address] ([id number]) on 08/16/2024 9:03:27 AM    Echo 02/23/2024  1. Left ventricular ejection fraction, by estimation, is 60 to 65%. The  left ventricle has normal function. The left ventricle has no regional  wall motion abnormalities. Left ventricular diastolic parameters are  consistent with Grade I diastolic  dysfunction (impaired relaxation).   2. Right ventricular systolic function is normal. The right ventricular  size is normal. There is normal pulmonary artery systolic pressure. The  estimated right ventricular systolic pressure is 19.1 mmHg.   3. Left  atrial size was mildly dilated.   4. The sonographer identified a possible right atrial mass, however, this  is only noted in the subcostal view and it is also noted the mass is not  mobile and that the tricuspid valve excursion can be seen moving over the  mass, suggesting that it is  more likely to be echo artifact. Also, the same linear artifact is seen  extending into the left ventricle. This is consistent with comet tail  artifact.. Right atrial size was moderately dilated.   5. The mitral valve is grossly normal. Trivial mitral valve  regurgitation.   6. The aortic valve is tricuspid. Aortic valve regurgitation is trivial.   7. The pulmonic valve was abnormal.   8. The inferior vena cava is dilated in size with <50% respiratory  variability, suggesting right atrial pressure of 15 mmHg.   Epic records are reviewed at length today   CHA2DS2-VASc Score = 4  The patient's score is based upon: CHF History: 0 HTN History: 0 Diabetes History: 0 Stroke History: 0 Vascular Disease History: 1 Age  Score: 2 Gender Score: 1       ASSESSMENT AND PLAN: Persistent Atrial Fibrillation (ICD10:  I48.19) The patient's CHA2DS2-VASc score is 4, indicating a 4.8% annual risk of stroke.   S/p afib ablation 06/10/22 Patient remains in persistent afib.  Scheduled for DCCV on 08/19/24 Continue Xarelto  15 mg daily. Patient denies any missed doses in the past 3 weeks.  Continue diltiazem  30 mg PRN q 4 hours for heart racing.  Check bmet/cbc  Secondary Hypercoagulable State (ICD10:  D68.69) The patient is at significant risk for stroke/thromboembolism based upon her CHA2DS2-VASc Score of 4.  Continue Rivaroxaban  (Xarelto ). No bleeding issues.   CAD No anginal symptoms Followed by Dr Pietro   Follow up in the AF clinic post DCCV as scheduled.    Informed Consent   Shared Decision Making/Informed Consent The risks (stroke, cardiac arrhythmias rarely resulting in the need for a temporary or permanent pacemaker, skin irritation or burns and complications associated with conscious sedation including aspiration, arrhythmia, respiratory failure and death), benefits (restoration of normal sinus rhythm) and alternatives of a direct current cardioversion were explained in detail to Ms. Ignatowski and she agrees to proceed.       Daril Kicks PA-C Afib Clinic [facility] [address] [place], [place] [address] [phone] 08/16/2024 9:20 AM "

## 2024-08-16 NOTE — Telephone Encounter (Signed)
 Sent referral for gastroenterology consult for dysphagia.

## 2024-08-16 NOTE — Progress Notes (Signed)
 "   Primary Care Physician: Shelia Jereld BROCKS, NP Primary Cardiologist: Dr Pietro Primary Electrophysiologist: Dr Inocencio Referring Physician: Lesley triage    Shelia [name] is a 84 y.o. female with a history of HLD, CAD, atrial fibrillation who presents for follow up in the [facility].  The patient was initially diagnosed with atrial fibrillation early 2021 and underwent DCCV on 11/17/19. She had recurrent symptoms of persistent afib and had repeat DCCV on 04/16/21. Patient is on Eliquis  for a CHADS2VASC score of 4. She presented to urgent care 07/24/21 with palpitations and mild SOB. ECG showed rate controlled afib. There were no specific triggers that she could identify. Patient is s/p DCCV on 08/15/21.   Patient is s/p afib ablation with Dr Inocencio on 06/10/22.   On follow up today, patient reports that about one week ago she noticed more fatigue and SOB with exertion. She has also had some intermittent dizziness. She checked her Crist mobile two days ago which showed afib. There were no specific triggers that she could identify. She did miss a dose of Eliquis  but is sure she has not missed any since 8/27.  On follow up 05/20/23, she is currently in Afib. She feels SOB with exertion when in Afib. She missed a dose of Eliquis  on 9/17 and DCCV was rescheduled to 10/28. She has not missed any more doses of Eliquis  since then.    On follow up 05/29/23, she is currently in NSR. S/p successful DCCV on 05/25/23. Unfortunately, patient contacted clinic on 10/30 noting she went back into Afib and was prescribed PRN diltiazem . She did convert to normal rhythm after taking one pill of PRN diltiazem .   On follow-up 06/28/2024, patient is currently in A-fib.  She contacted the clinic on 11/25 noting via Kardia mobile device she was back in A-fib.  She has missed a few doses of Eliquis  and cannot specifically recall when she was last compliant other than  today.  Follow up 08/16/24. Patient returns for follow up for atrial fibrillation. She remains in persistent afib and is scheduled for DCCV on 08/19/24. She has symptoms of SOB on exertion when in afib. She denies any missed doses of anticoagulation in the past 3 weeks. No bleeding issues on anticoagulation.   Today, she  denies symptoms of palpitations, chest pain, orthopnea, PND, lower extremity edema, dizziness, presyncope, syncope, snoring, daytime somnolence, bleeding, or neurologic sequela. The patient is tolerating medications without difficulties and is otherwise without complaint today.    Atrial Fibrillation Risk Factors:  she does not have symptoms or diagnosis of sleep apnea. she does not have a history of rheumatic fever. she does not have a history of alcohol use.   Atrial Fibrillation Management history:  Previous antiarrhythmic drugs: none Previous cardioversions: 11/17/19, 04/16/21, 05/25/23 Previous ablations: 06/10/22 Anticoagulation history: Eliquis    Past Medical History:  Diagnosis Date   Anxiety    Arthritis    left knee; left hip; lower back; mild to moderate in right hip (09/30/2016)   Atrial fibrillation (HCC)    Chronic lower back pain    Gastritis    Heart palpitations    History of bone density study 2018   History of mammogram 2021   History of MRI 2018   History of Papanicolaou smear of cervix    Hypercholesteremia    Osteopenia    Seasonal allergies     Current Outpatient Medications  Medication Sig Dispense Refill   acetaminophen  (TYLENOL ) 325 MG tablet  Take 325 mg by mouth every 6 (six) hours as needed (pain.).     Brimonidine Tartrate (LUMIFY) 0.025 % SOLN Place 1 drop into both eyes daily. Red eyes     Carboxymeth-Glyc-Polysorb PF (REFRESH OPTIVE MEGA-3) 0.5-1-0.5 % SOLN Place 1 drop into both eyes 4 (four) times daily as needed (dry eyes). Enhance with flax seed oil     cetirizine  (ZYRTEC ) 10 MG tablet Take 1 tablet (10 mg total) by mouth  daily as needed for allergies.     diltiazem  (CARDIZEM ) 30 MG tablet Take 1 tablet every 4 hours AS NEEDED for AFIB heart rate >100 as long as top BP >100. 30 tablet 1   estradiol  (ESTRACE ) 0.1 MG/GM vaginal cream Apply to vaginal area 2 (two) times a week as directed. 42.5 g 1   EYELID CLEANSERS EX Apply 1 spray topically daily. PURE & CLEAN Daily Lid Cleanse Spray     hydrocortisone  cream 0.5 % Apply 1 Application topically 2 (two) times daily as needed for itching.     ketotifen (ZADITOR) 0.025 % ophthalmic solution Place 1 drop into both eyes 2 (two) times daily as needed (itchy eyes). In the fall     METAMUCIL FIBER PO Take 2 Scoops by mouth daily.     Multiple Vitamin (MULTIVITAMIN) LIQD Take 60 mLs by mouth in the morning.     Rivaroxaban  (XARELTO ) 15 MG TABS tablet Take 1 tablet (15 mg total) by mouth daily with supper. 30 tablet 6   rosuvastatin  (CRESTOR ) 10 MG tablet Take 1 tablet (10 mg total) by mouth daily. 90 tablet 3   triamcinolone cream (KENALOG) 0.1 % Apply 1 Application topically 2 (two) times daily as needed (itching).     Vitamins C E (CRANBERRY URINARY COMFORT PO) Take 1 capsule by mouth in the morning.     No current facility-administered medications for this encounter.    ROS- All systems are reviewed and negative except as per the HPI above.  Physical Exam: Vitals:   08/16/24 0856  BP: 94/72  Pulse: 80  Weight: 64.7 kg  Height: 5' 7 (1.702 m)     GEN: Well nourished, well developed in no acute distress CARDIAC: Irregularly irregular rate and rhythm, no murmurs, rubs, gallops RESPIRATORY:  Clear to auscultation without rales, wheezing or rhonchi  ABDOMEN: Soft, non-tender, non-distended EXTREMITIES:  No edema; No deformity    Wt Readings from Last 3 Encounters:  08/16/24 64.7 kg  08/01/24 64.4 kg  07/05/24 62.3 kg    ECG today demonstrates  EKG Interpretation Date/Time:  Tuesday August 16 2024 08:59:20 EST Ventricular Rate:  80 PR Interval:     QRS Duration:  78 QT Interval:  360 QTC Calculation: 415 R Axis:   -39  Text Interpretation: Atrial fibrillation Left axis deviation Low voltage QRS Cannot rule out Anteroseptal infarct (cited on or before 28-Jun-2024) Abnormal ECG When compared with ECG of 28-Jun-2024 08:29, No significant change was found Confirmed by Shelia [name] ([id number]) on 08/16/2024 9:03:27 AM    Echo 02/23/2024  1. Left ventricular ejection fraction, by estimation, is 60 to 65%. The  left ventricle has normal function. The left ventricle has no regional  wall motion abnormalities. Left ventricular diastolic parameters are  consistent with Grade I diastolic  dysfunction (impaired relaxation).   2. Right ventricular systolic function is normal. The right ventricular  size is normal. There is normal pulmonary artery systolic pressure. The  estimated right ventricular systolic pressure is 19.1 mmHg.   3. Left  atrial size was mildly dilated.   4. The sonographer identified a possible right atrial mass, however, this  is only noted in the subcostal view and it is also noted the mass is not  mobile and that the tricuspid valve excursion can be seen moving over the  mass, suggesting that it is  more likely to be echo artifact. Also, the same linear artifact is seen  extending into the left ventricle. This is consistent with comet tail  artifact.. Right atrial size was moderately dilated.   5. The mitral valve is grossly normal. Trivial mitral valve  regurgitation.   6. The aortic valve is tricuspid. Aortic valve regurgitation is trivial.   7. The pulmonic valve was abnormal.   8. The inferior vena cava is dilated in size with <50% respiratory  variability, suggesting right atrial pressure of 15 mmHg.   Epic records are reviewed at length today   CHA2DS2-VASc Score = 4  The patient's score is based upon: CHF History: 0 HTN History: 0 Diabetes History: 0 Stroke History: 0 Vascular Disease History: 1 Age  Score: 2 Gender Score: 1       ASSESSMENT AND PLAN: Persistent Atrial Fibrillation (ICD10:  I48.19) The patient's CHA2DS2-VASc score is 4, indicating a 4.8% annual risk of stroke.   S/p afib ablation 06/10/22 Patient remains in persistent afib.  Scheduled for DCCV on 08/19/24 Continue Xarelto  15 mg daily. Patient denies any missed doses in the past 3 weeks.  Continue diltiazem  30 mg PRN q 4 hours for heart racing.  Check bmet/cbc  Secondary Hypercoagulable State (ICD10:  D68.69) The patient is at significant risk for stroke/thromboembolism based upon her CHA2DS2-VASc Score of 4.  Continue Rivaroxaban  (Xarelto ). No bleeding issues.   CAD No anginal symptoms Followed by Dr Pietro   Follow up in the AF clinic post DCCV as scheduled.    Informed Consent   Shared Decision Making/Informed Consent The risks (stroke, cardiac arrhythmias rarely resulting in the need for a temporary or permanent pacemaker, skin irritation or burns and complications associated with conscious sedation including aspiration, arrhythmia, respiratory failure and death), benefits (restoration of normal sinus rhythm) and alternatives of a direct current cardioversion were explained in detail to Shelia [name] and she agrees to proceed.       Daril Kicks PA-C Afib Clinic [facility] [address] [place], [place] [address] [phone] 08/16/2024 9:20 AM "

## 2024-08-16 NOTE — Patient Instructions (Addendum)
 Cardioversion scheduled for: Friday January 23rd   - Arrive at the [facility] ([address])  and check in with ADMITTING at 730am   - Do not eat or drink anything after midnight the night prior to your procedure.   - Take all your morning medication (except diabetic medications) with a sip of water prior to arrival.  - Do NOT miss any doses of your blood thinner - if you should miss a dose or take a dose more than 4 hours late -- please notify our office immediately.  - You will not be able to drive home after your procedure. Please ensure you have a responsible adult to drive you home. You will need someone with you for 24 hours post procedure.     - Expect to be in the procedural area approximately 2 hours.   - If you feel as if you go back into normal rhythm prior to scheduled cardioversion, please notify our office immediately.   If your procedure is canceled in the cardioversion suite you will be charged a cancellation fee.

## 2024-08-19 ENCOUNTER — Ambulatory Visit (HOSPITAL_COMMUNITY): Admitting: Anesthesiology

## 2024-08-19 ENCOUNTER — Encounter (HOSPITAL_COMMUNITY)
Admission: RE | Disposition: A | Discharge status: Home / Self Care | Payer: Self-pay | Source: Home / Self Care | Attending: Cardiovascular Disease

## 2024-08-19 ENCOUNTER — Ambulatory Visit (HOSPITAL_COMMUNITY)
Admission: RE | Admit: 2024-08-19 | Discharge: 2024-08-19 | Disposition: A | Discharge status: Home / Self Care | Attending: Cardiovascular Disease | Admitting: Cardiovascular Disease

## 2024-08-19 ENCOUNTER — Other Ambulatory Visit: Payer: Self-pay

## 2024-08-19 ENCOUNTER — Encounter (HOSPITAL_COMMUNITY): Payer: Self-pay | Admitting: Cardiovascular Disease

## 2024-08-19 DIAGNOSIS — Z87891 Personal history of nicotine dependence: Secondary | ICD-10-CM | POA: Insufficient documentation

## 2024-08-19 DIAGNOSIS — I4891 Unspecified atrial fibrillation: Secondary | ICD-10-CM

## 2024-08-19 DIAGNOSIS — F419 Anxiety disorder, unspecified: Secondary | ICD-10-CM

## 2024-08-19 DIAGNOSIS — I4819 Other persistent atrial fibrillation: Secondary | ICD-10-CM | POA: Diagnosis present

## 2024-08-19 DIAGNOSIS — E785 Hyperlipidemia, unspecified: Secondary | ICD-10-CM | POA: Diagnosis not present

## 2024-08-19 DIAGNOSIS — I2510 Atherosclerotic heart disease of native coronary artery without angina pectoris: Secondary | ICD-10-CM | POA: Diagnosis not present

## 2024-08-19 DIAGNOSIS — Z7901 Long term (current) use of anticoagulants: Secondary | ICD-10-CM | POA: Insufficient documentation

## 2024-08-19 DIAGNOSIS — D6869 Other thrombophilia: Secondary | ICD-10-CM | POA: Insufficient documentation

## 2024-08-19 MED ORDER — PROPOFOL 10 MG/ML IV BOLUS
INTRAVENOUS | Status: DC | PRN
  Administered 2024-08-19: 50 mg via INTRAVENOUS

## 2024-08-19 MED ORDER — LIDOCAINE 2% (20 MG/ML) 5 ML SYRINGE
INTRAMUSCULAR | Status: DC | PRN
  Administered 2024-08-19: 60 mg via INTRAVENOUS

## 2024-08-19 NOTE — Op Note (Addendum)
 Procedure: Electrical Cardioversion Indications:  Atrial Fibrillation  Procedure Details:  Consent: Risks of procedure as well as the alternatives and risks of each were explained to the (patient/caregiver).  Consent for procedure obtained.  Time Out: Verified patient identification, verified procedure, site/side was marked, verified correct patient position, special equipment/implants available, medications/allergies/relevent history reviewed, required imaging and test results available.  Performed  Patient placed on cardiac monitor, pulse oximetry, supplemental oxygen as necessary.  Sedation given: propofol  50 mg IV Pacer pads placed anterior and posterior chest.  Cardioverted 1 time(s).  Cardioversion with synchronized biphasic 200J shock.  Evaluation: Findings: Post procedure EKG shows: long period of junctional escape beats and long pauses (2-3), followed byNSRinus bradycardia with PACs Complications: None Patient did tolerate procedure well.  Time Spent Directly with the Patient:  30 minutes   Shelia [name] 08/19/2024, 8:44 AM

## 2024-08-19 NOTE — Transfer of Care (Signed)
 Immediate Anesthesia Transfer of Care Note  Patient: LAILEE HOELZEL  Procedure(s) Performed: CARDIOVERSION  Patient Location: Cath Lab  Anesthesia Type:General  Level of Consciousness: awake, alert , and oriented  Airway & Oxygen Therapy: Patient Spontanous Breathing and Patient connected to nasal cannula oxygen  Post-op Assessment: Report given to RN and Post -op Vital signs reviewed and stable  Post vital signs: Reviewed and stable  Last Vitals:  Vitals Value Taken Time  BP 113/68 0830  Temp 36 0830  Pulse 54 0830  Resp 16 0830  SpO2 98 0830    Last Pain:  Vitals:   08/19/24 0740  TempSrc:   PainSc: 0-No pain         Complications: There were no known notable events for this encounter.

## 2024-08-19 NOTE — Anesthesia Preprocedure Evaluation (Signed)
"                                    Anesthesia Evaluation  Patient identified by MRN, date of birth, ID band Patient awake    Reviewed: Allergy & Precautions, H&P , NPO status , Patient's Chart, lab work & pertinent test results  Airway Mallampati: II   Neck ROM: full    Dental   Pulmonary shortness of breath, former smoker   breath sounds clear to auscultation       Cardiovascular negative cardio ROS  Rhythm:irregular Rate:Normal     Neuro/Psych  PSYCHIATRIC DISORDERS Anxiety      Neuromuscular disease    GI/Hepatic   Endo/Other    Renal/GU      Musculoskeletal  (+) Arthritis ,    Abdominal   Peds  Hematology   Anesthesia Other Findings   Reproductive/Obstetrics                              Anesthesia Physical Anesthesia Plan  ASA: 3  Anesthesia Plan: General   Post-op Pain Management:    Induction: Intravenous  PONV Risk Score and Plan: 3 and Treatment may vary due to age or medical condition and Propofol  infusion  Airway Management Planned: Nasal Cannula  Additional Equipment:   Intra-op Plan:   Post-operative Plan:   Informed Consent: I have reviewed the patients History and Physical, chart, labs and discussed the procedure including the risks, benefits and alternatives for the proposed anesthesia with the patient or authorized representative who has indicated his/her understanding and acceptance.     Dental advisory given  Plan Discussed with: CRNA, Anesthesiologist and Surgeon  Anesthesia Plan Comments:         Anesthesia Quick Evaluation  "

## 2024-08-19 NOTE — Interval H&P Note (Signed)
 History and Physical Interval Note:  08/19/2024 8:19 AM  Shelia [name] Likes  has presented today for surgery, with the diagnosis of AFIB.  The various methods of treatment have been discussed with the patient and family. After consideration of risks, benefits and other options for treatment, the patient has consented to  Procedures: CARDIOVERSION (N/A) as a surgical intervention.  The patient's history has been reviewed, patient examined, no change in status, stable for surgery.  I have reviewed the patient's chart and labs.  Questions were answered to the patient's satisfaction.     Gottlieb Zuercher

## 2024-08-19 NOTE — Anesthesia Postprocedure Evaluation (Signed)
"   Anesthesia Post Note  Patient: Shelia [name]  Procedure(s) Performed: CARDIOVERSION     Patient location during evaluation: Cath Lab Anesthesia Type: General Level of consciousness: awake and alert Pain management: pain level controlled Vital Signs Assessment: post-procedure vital signs reviewed and stable Respiratory status: spontaneous breathing, nonlabored ventilation, respiratory function stable and patient connected to nasal cannula oxygen Cardiovascular status: blood pressure returned to baseline and stable Postop Assessment: no apparent nausea or vomiting Anesthetic complications: no   There were no known notable events for this encounter.  Last Vitals:  Vitals:   08/19/24 0845 08/19/24 0850  BP: (!) 88/66 91/61  Pulse: (!) 49 (!) 49  Resp: 16 16  Temp:    SpO2: 96% 96%    Last Pain:  Vitals:   08/19/24 0750  TempSrc:   PainSc: 0-No pain                 Anida Deol S      "

## 2024-08-21 ENCOUNTER — Encounter (HOSPITAL_COMMUNITY): Payer: Self-pay | Admitting: Cardiovascular Disease

## 2024-08-24 ENCOUNTER — Encounter: Payer: Self-pay | Admitting: Adult Health

## 2024-08-24 NOTE — Telephone Encounter (Signed)
 Tobias please advise if you have already performed the action of sending appropriate records with referral

## 2024-09-01 ENCOUNTER — Ambulatory Visit (HOSPITAL_COMMUNITY)
Admission: RE | Admit: 2024-09-01 | Discharge: 2024-09-01 | Disposition: A | Discharge status: Home / Self Care | Source: Ambulatory Visit | Attending: Internal Medicine | Admitting: Internal Medicine

## 2024-09-01 ENCOUNTER — Encounter (HOSPITAL_COMMUNITY): Payer: Self-pay | Admitting: Internal Medicine

## 2024-09-01 VITALS — BP 108/78 | HR 55 | Ht 67.0 in | Wt 142.9 lb

## 2024-09-01 DIAGNOSIS — I4819 Other persistent atrial fibrillation: Secondary | ICD-10-CM

## 2024-09-01 DIAGNOSIS — D6869 Other thrombophilia: Secondary | ICD-10-CM

## 2024-09-01 NOTE — Progress Notes (Signed)
 "   Primary Care Physician: Phyllis Jereld BROCKS, NP Primary Cardiologist: Dr Pietro Primary Electrophysiologist: Dr Inocencio Referring Physician: Lesley triage    Shelia [name] is a 84 y.o. female with a history of HLD, CAD, atrial fibrillation who presents for follow up in the [facility].  The patient was initially diagnosed with atrial fibrillation early 2021 and underwent DCCV on 11/17/19. She had recurrent symptoms of persistent afib and had repeat DCCV on 04/16/21. Patient is on Eliquis  for a CHADS2VASC score of 4. She presented to urgent care 07/24/21 with palpitations and mild SOB. ECG showed rate controlled afib. There were no specific triggers that she could identify. Patient is s/p DCCV on 08/15/21.   Patient is s/p afib ablation with Dr Inocencio on 06/10/22.   On follow up today, patient reports that about one week ago she noticed more fatigue and SOB with exertion. She has also had some intermittent dizziness. She checked her Crist mobile two days ago which showed afib. There were no specific triggers that she could identify. She did miss a dose of Eliquis  but is sure she has not missed any since 8/27.  On follow up 05/20/23, she is currently in Afib. She feels SOB with exertion when in Afib. She missed a dose of Eliquis  on 9/17 and DCCV was rescheduled to 10/28. She has not missed any more doses of Eliquis  since then.    On follow up 05/29/23, she is currently in NSR. S/p successful DCCV on 05/25/23. Unfortunately, patient contacted clinic on 10/30 noting she went back into Afib and was prescribed PRN diltiazem . She did convert to normal rhythm after taking one pill of PRN diltiazem .   On follow-up 06/28/2024, patient is currently in A-fib.  She contacted the clinic on 11/25 noting via Kardia mobile device she was back in A-fib.  She has missed a few doses of Eliquis  and cannot specifically recall when she was last compliant other than  today.  Follow up 08/16/24. Patient returns for follow up for atrial fibrillation. She remains in persistent afib and is scheduled for DCCV on 08/19/24. She has symptoms of SOB on exertion when in afib. She denies any missed doses of anticoagulation in the past 3 weeks. No bleeding issues on anticoagulation.   Follow-up 09/01/2024.  Patient is currently in NSR.  S/p successful DCCV on 08/19/2024.  She feels better overall. She has noted an occasional sensation of heart pounding (not Afib). It is random and brief but bothersome. No missed doses of Xarelto .  Today, she  denies symptoms of palpitations, chest pain, orthopnea, PND, lower extremity edema, dizziness, presyncope, syncope, snoring, daytime somnolence, bleeding, or neurologic sequela. The patient is tolerating medications without difficulties and is otherwise without complaint today.    Atrial Fibrillation Risk Factors:  she does not have symptoms or diagnosis of sleep apnea. she does not have a history of rheumatic fever. she does not have a history of alcohol use.   Atrial Fibrillation Management history:  Previous antiarrhythmic drugs: none Previous cardioversions: 11/17/19, 04/16/21, 05/25/23, 08/19/2024 Previous ablations: 06/10/22 Anticoagulation history: Eliquis    Past Medical History:  Diagnosis Date   Anxiety    Arthritis    left knee; left hip; lower back; mild to moderate in right hip (09/30/2016)   Atrial fibrillation (HCC)    Chronic lower back pain    Gastritis    Heart palpitations    History of bone density study 2018   History of mammogram 2021  History of MRI 2018   History of Papanicolaou smear of cervix    Hypercholesteremia    Osteopenia    Seasonal allergies     Current Outpatient Medications  Medication Sig Dispense Refill   acetaminophen  (TYLENOL ) 325 MG tablet Take 325 mg by mouth every 6 (six) hours as needed (pain.).     Brimonidine Tartrate (LUMIFY) 0.025 % SOLN Place 1 drop into both eyes  daily. Red eyes     Carboxymeth-Glyc-Polysorb PF (REFRESH OPTIVE MEGA-3) 0.5-1-0.5 % SOLN Place 1 drop into both eyes 4 (four) times daily as needed (dry eyes). Enhance with flax seed oil     cetirizine  (ZYRTEC ) 10 MG tablet Take 1 tablet (10 mg total) by mouth daily as needed for allergies.     diltiazem  (CARDIZEM ) 30 MG tablet Take 1 tablet every 4 hours AS NEEDED for AFIB heart rate >100 as long as top BP >100. 30 tablet 1   estradiol  (ESTRACE ) 0.1 MG/GM vaginal cream Apply to vaginal area 2 (two) times a week as directed. 42.5 g 1   EYELID CLEANSERS EX Apply 1 spray topically daily. PURE & CLEAN Daily Lid Cleanse Spray     hydrocortisone  cream 0.5 % Apply 1 Application topically 2 (two) times daily as needed for itching.     ketotifen (ZADITOR) 0.025 % ophthalmic solution Place 1 drop into both eyes 2 (two) times daily as needed (itchy eyes). In the fall     METAMUCIL FIBER PO Take 2 Scoops by mouth daily.     Multiple Vitamin (MULTIVITAMIN) LIQD Take 60 mLs by mouth in the morning.     Rivaroxaban  (XARELTO ) 15 MG TABS tablet Take 1 tablet (15 mg total) by mouth daily with supper. 30 tablet 6   rosuvastatin  (CRESTOR ) 10 MG tablet Take 1 tablet (10 mg total) by mouth daily. 90 tablet 3   triamcinolone cream (KENALOG) 0.1 % Apply 1 Application topically 2 (two) times daily as needed (itching).     Vitamins C E (CRANBERRY URINARY COMFORT PO) Take 1 capsule by mouth in the morning.     No current facility-administered medications for this encounter.    ROS- All systems are reviewed and negative except as per the HPI above.  Physical Exam: Vitals:   09/01/24 0901  BP: 108/78  Pulse: (!) 55  Weight: 64.8 kg  Height: 5' 7 (1.702 m)    GEN- The patient is well appearing, alert and oriented x 3 today.   Neck - no JVD or carotid bruit noted Lungs- Clear to ausculation bilaterally, normal work of breathing Heart- Regular rate and rhythm, no murmurs, rubs or gallops, PMI not laterally  displaced Extremities- no clubbing, cyanosis, or edema Skin - no rash or ecchymosis noted    Wt Readings from Last 3 Encounters:  09/01/24 64.8 kg  08/16/24 64.7 kg  08/01/24 64.4 kg    ECG today demonstrates  EKG Interpretation Date/Time:  Thursday September 01 2024 09:04:15 EST Ventricular Rate:  55 PR Interval:  152 QRS Duration:  80 QT Interval:  444 QTC Calculation: 424 R Axis:   -29  Text Interpretation: Sinus bradycardia When compared with ECG of 19-Aug-2024 08:34, Premature supraventricular complexes are no longer Present Confirmed by Terra Pac ([id number]) on 09/01/2024 9:18:11 AM     Echo 02/23/2024  1. Left ventricular ejection fraction, by estimation, is 60 to 65%. The  left ventricle has normal function. The left ventricle has no regional  wall motion abnormalities. Left ventricular diastolic parameters are  consistent with Grade I diastolic  dysfunction (impaired relaxation).   2. Right ventricular systolic function is normal. The right ventricular  size is normal. There is normal pulmonary artery systolic pressure. The  estimated right ventricular systolic pressure is 19.1 mmHg.   3. Left atrial size was mildly dilated.   4. The sonographer identified a possible right atrial mass, however, this  is only noted in the subcostal view and it is also noted the mass is not  mobile and that the tricuspid valve excursion can be seen moving over the  mass, suggesting that it is  more likely to be echo artifact. Also, the same linear artifact is seen  extending into the left ventricle. This is consistent with comet tail  artifact.. Right atrial size was moderately dilated.   5. The mitral valve is grossly normal. Trivial mitral valve  regurgitation.   6. The aortic valve is tricuspid. Aortic valve regurgitation is trivial.   7. The pulmonic valve was abnormal.   8. The inferior vena cava is dilated in size with <50% respiratory  variability, suggesting right atrial  pressure of 15 mmHg.   Epic records are reviewed at length today   CHA2DS2-VASc Score = 4  The patient's score is based upon: CHF History: 0 HTN History: 0 Diabetes History: 0 Stroke History: 0 Vascular Disease History: 1 Age Score: 2 Gender Score: 1       ASSESSMENT AND PLAN: Persistent Atrial Fibrillation (ICD10:  I48.19) The patient's CHA2DS2-VASc score is 4, indicating a 4.8% annual risk of stroke.   S/p afib ablation 06/10/22 S/p DCCV on 08/19/2024  Patient is currently in NSR. She will contact clinic if heart pounding sensation becomes frequent. We will otherwise continue with conservative observation at this time.    Secondary Hypercoagulable State (ICD10:  D68.69) The patient is at significant risk for stroke/thromboembolism based upon her CHA2DS2-VASc Score of 4.  Continue Rivaroxaban  (Xarelto ).  No missed doses.  Continue Xarelto  15 mg daily.  CAD No anginal symptoms. Followed by Dr Pietro   Follow up 6 months Afib clinic.   Dorn Heinrich, PA-C Afib Clinic [facility] [address] [place], [place] [address] [phone] 09/01/2024 9:18 AM "

## 2024-09-13 ENCOUNTER — Ambulatory Visit: Payer: Self-pay | Admitting: Neurology

## 2024-09-19 ENCOUNTER — Ambulatory Visit: Admitting: Nurse Practitioner

## 2024-11-24 ENCOUNTER — Ambulatory Visit: Admitting: Adult Health

## 2025-03-01 ENCOUNTER — Ambulatory Visit (HOSPITAL_COMMUNITY): Admitting: Internal Medicine

## 2025-05-18 ENCOUNTER — Ambulatory Visit: Payer: Self-pay | Admitting: Adult Health
